# Patient Record
Sex: Female | Born: 1935 | Race: White | Hispanic: No | Marital: Single | Smoking: Never smoker
Health system: Southern US, Community
[De-identification: ages and names within clinical notes are randomized; demographics above are authoritative.]

## PROBLEM LIST (undated history)

## (undated) DIAGNOSIS — I5022 Chronic systolic (congestive) heart failure: Secondary | ICD-10-CM

## (undated) DIAGNOSIS — R4189 Other symptoms and signs involving cognitive functions and awareness: Secondary | ICD-10-CM

## (undated) DIAGNOSIS — E785 Hyperlipidemia, unspecified: Secondary | ICD-10-CM

## (undated) DIAGNOSIS — M1990 Unspecified osteoarthritis, unspecified site: Secondary | ICD-10-CM

## (undated) DIAGNOSIS — F410 Panic disorder [episodic paroxysmal anxiety] without agoraphobia: Secondary | ICD-10-CM

## (undated) DIAGNOSIS — F419 Anxiety disorder, unspecified: Secondary | ICD-10-CM

## (undated) DIAGNOSIS — F329 Major depressive disorder, single episode, unspecified: Secondary | ICD-10-CM

## (undated) DIAGNOSIS — I10 Essential (primary) hypertension: Secondary | ICD-10-CM

## (undated) DIAGNOSIS — G20 Parkinson's disease: Secondary | ICD-10-CM

## (undated) DIAGNOSIS — I4819 Other persistent atrial fibrillation: Secondary | ICD-10-CM

## (undated) DIAGNOSIS — F32A Depression, unspecified: Secondary | ICD-10-CM

## (undated) DIAGNOSIS — R2681 Unsteadiness on feet: Secondary | ICD-10-CM

## (undated) DIAGNOSIS — G20A1 Parkinson's disease without dyskinesia, without mention of fluctuations: Secondary | ICD-10-CM

## (undated) HISTORY — DX: Other persistent atrial fibrillation: I48.19

## (undated) HISTORY — DX: Panic disorder (episodic paroxysmal anxiety): F41.0

## (undated) HISTORY — DX: Unsteadiness on feet: R26.81

## (undated) HISTORY — DX: Parkinson's disease without dyskinesia, without mention of fluctuations: G20.A1

## (undated) HISTORY — DX: Parkinson's disease: G20

## (undated) HISTORY — DX: Chronic systolic (congestive) heart failure: I50.22

## (undated) HISTORY — DX: Unspecified osteoarthritis, unspecified site: M19.90

## (undated) HISTORY — DX: Hyperlipidemia, unspecified: E78.5

## (undated) HISTORY — DX: Other symptoms and signs involving cognitive functions and awareness: R41.89

## (undated) HISTORY — DX: Essential (primary) hypertension: I10

## (undated) HISTORY — DX: Depression, unspecified: F32.A

## (undated) HISTORY — PX: EYE SURGERY: SHX253

## (undated) HISTORY — DX: Anxiety disorder, unspecified: F41.9

## (undated) HISTORY — DX: Major depressive disorder, single episode, unspecified: F32.9

---

## 2005-09-17 ENCOUNTER — Ambulatory Visit: Payer: Self-pay | Admitting: Internal Medicine

## 2007-04-22 ENCOUNTER — Ambulatory Visit: Payer: Self-pay | Admitting: Internal Medicine

## 2007-12-22 ENCOUNTER — Other Ambulatory Visit: Payer: Self-pay

## 2007-12-22 ENCOUNTER — Ambulatory Visit: Payer: Self-pay | Admitting: Ophthalmology

## 2007-12-29 ENCOUNTER — Ambulatory Visit: Payer: Self-pay | Admitting: Ophthalmology

## 2008-04-27 ENCOUNTER — Ambulatory Visit: Payer: Self-pay | Admitting: Internal Medicine

## 2009-05-03 ENCOUNTER — Ambulatory Visit: Payer: Self-pay | Admitting: Internal Medicine

## 2010-05-08 ENCOUNTER — Ambulatory Visit: Payer: Self-pay | Admitting: Internal Medicine

## 2011-06-04 ENCOUNTER — Ambulatory Visit: Payer: Self-pay | Admitting: Internal Medicine

## 2012-02-07 ENCOUNTER — Observation Stay: Payer: Self-pay | Admitting: Internal Medicine

## 2012-02-07 LAB — CBC
HCT: 34.7 % — ABNORMAL LOW (ref 35.0–47.0)
HGB: 12.1 g/dL (ref 12.0–16.0)
MCH: 33.2 pg (ref 26.0–34.0)
MCHC: 34.9 g/dL (ref 32.0–36.0)
MCV: 95 fL (ref 80–100)
Platelet: 280 10*3/uL (ref 150–440)
RBC: 3.65 10*6/uL — ABNORMAL LOW (ref 3.80–5.20)
WBC: 5.9 10*3/uL (ref 3.6–11.0)

## 2012-02-07 LAB — COMPREHENSIVE METABOLIC PANEL
Albumin: 3.4 g/dL (ref 3.4–5.0)
Alkaline Phosphatase: 88 U/L (ref 50–136)
Anion Gap: 8 (ref 7–16)
Calcium, Total: 9 mg/dL (ref 8.5–10.1)
Creatinine: 0.95 mg/dL (ref 0.60–1.30)
Glucose: 103 mg/dL — ABNORMAL HIGH (ref 65–99)
Osmolality: 287 (ref 275–301)
Potassium: 3.2 mmol/L — ABNORMAL LOW (ref 3.5–5.1)
SGOT(AST): 21 U/L (ref 15–37)

## 2012-02-07 LAB — URINALYSIS, COMPLETE
Bilirubin,UR: NEGATIVE
Blood: NEGATIVE
Glucose,UR: NEGATIVE mg/dL (ref 0–75)
Ketone: NEGATIVE
Nitrite: NEGATIVE
Protein: NEGATIVE
RBC,UR: 1 /HPF (ref 0–5)
Squamous Epithelial: 2
WBC UR: <1 /HPF (ref 0–5)

## 2012-02-08 LAB — BASIC METABOLIC PANEL
BUN: 20 mg/dL — ABNORMAL HIGH (ref 7–18)
Calcium, Total: 8.8 mg/dL (ref 8.5–10.1)
Chloride: 105 mmol/L (ref 98–107)
Creatinine: 0.97 mg/dL (ref 0.60–1.30)
EGFR (African American): >60
EGFR (Non-African Amer.): 55 — ABNORMAL LOW
Osmolality: 286 (ref 275–301)

## 2012-03-15 ENCOUNTER — Emergency Department: Payer: Self-pay | Admitting: Emergency Medicine

## 2012-06-17 ENCOUNTER — Ambulatory Visit: Payer: Self-pay | Admitting: Internal Medicine

## 2012-06-19 ENCOUNTER — Ambulatory Visit: Payer: Self-pay | Admitting: Internal Medicine

## 2014-07-01 ENCOUNTER — Ambulatory Visit: Payer: Self-pay | Admitting: Internal Medicine

## 2014-07-14 LAB — URINALYSIS, COMPLETE
BLOOD: NEGATIVE
Bilirubin,UR: NEGATIVE
GLUCOSE, UR: NEGATIVE mg/dL (ref 0–75)
Ketone: NEGATIVE
LEUKOCYTE ESTERASE: NEGATIVE
NITRITE: NEGATIVE
PH: 6 (ref 4.5–8.0)
Protein: NEGATIVE
RBC,UR: <1 /HPF (ref 0–5)
Specific Gravity: 1.005 (ref 1.003–1.030)
Squamous Epithelial: NONE SEEN
WBC UR: 1 /HPF (ref 0–5)

## 2014-07-14 LAB — COMPREHENSIVE METABOLIC PANEL
ALBUMIN: 3.1 g/dL — AB (ref 3.4–5.0)
ALK PHOS: 80 U/L
ANION GAP: 9 (ref 7–16)
AST: 26 U/L (ref 15–37)
BUN: 18 mg/dL (ref 7–18)
Bilirubin,Total: 0.4 mg/dL (ref 0.2–1.0)
Calcium, Total: 8.6 mg/dL (ref 8.5–10.1)
Chloride: 102 mmol/L (ref 98–107)
Co2: 29 mmol/L (ref 21–32)
Creatinine: 1.2 mg/dL (ref 0.60–1.30)
EGFR (African American): 55 — ABNORMAL LOW
EGFR (Non-African Amer.): 46 — ABNORMAL LOW
GLUCOSE: 99 mg/dL (ref 65–99)
Osmolality: 281 (ref 275–301)
POTASSIUM: 3.3 mmol/L — AB (ref 3.5–5.1)
SGPT (ALT): 18 U/L
Sodium: 140 mmol/L (ref 136–145)
TOTAL PROTEIN: 6.8 g/dL (ref 6.4–8.2)

## 2014-07-14 LAB — DIFFERENTIAL
BASOS ABS: 0.1 10*3/uL (ref 0.0–0.1)
Basophil %: 0.7 %
Eosinophil #: 0.2 10*3/uL (ref 0.0–0.7)
Eosinophil %: 2.5 %
LYMPHS PCT: 10.9 %
Lymphocyte #: 1 10*3/uL (ref 1.0–3.6)
Monocyte #: 1.1 x10 3/mm — ABNORMAL HIGH (ref 0.2–0.9)
Monocyte %: 12.1 %
Neutrophil #: 6.5 10*3/uL (ref 1.4–6.5)
Neutrophil %: 73.8 %

## 2014-07-14 LAB — CBC
HCT: 35.2 % (ref 35.0–47.0)
HGB: 11.6 g/dL — AB (ref 12.0–16.0)
MCH: 33 pg (ref 26.0–34.0)
MCHC: 32.9 g/dL (ref 32.0–36.0)
MCV: 100 fL (ref 80–100)
Platelet: 236 10*3/uL (ref 150–440)
RBC: 3.51 10*6/uL — AB (ref 3.80–5.20)
RDW: 13.4 % (ref 11.5–14.5)
WBC: 8.8 10*3/uL (ref 3.6–11.0)

## 2014-07-14 LAB — TSH: Thyroid Stimulating Horm: 6.69 u[IU]/mL — ABNORMAL HIGH

## 2014-07-14 LAB — TROPONIN I: Troponin-I: <0.02 ng/mL

## 2014-07-15 ENCOUNTER — Observation Stay: Payer: Self-pay | Admitting: Internal Medicine

## 2014-07-15 ENCOUNTER — Other Ambulatory Visit: Payer: Self-pay | Admitting: Physician Assistant

## 2014-07-15 DIAGNOSIS — I4891 Unspecified atrial fibrillation: Secondary | ICD-10-CM

## 2014-07-15 LAB — CBC WITH DIFFERENTIAL/PLATELET
Basophil #: 0.2 10*3/uL — ABNORMAL HIGH (ref 0.0–0.1)
Basophil %: 2.8 %
Eosinophil #: 0.2 10*3/uL (ref 0.0–0.7)
Eosinophil %: 3.6 %
HCT: 31.2 % — ABNORMAL LOW (ref 35.0–47.0)
HGB: 10.4 g/dL — AB (ref 12.0–16.0)
LYMPHS PCT: 11.6 %
Lymphocyte #: 0.8 10*3/uL — ABNORMAL LOW (ref 1.0–3.6)
MCH: 33.1 pg (ref 26.0–34.0)
MCHC: 33.4 g/dL (ref 32.0–36.0)
MCV: 99 fL (ref 80–100)
Monocyte #: 0.8 x10 3/mm (ref 0.2–0.9)
Monocyte %: 10.9 %
NEUTROS PCT: 71.1 %
Neutrophil #: 4.9 10*3/uL (ref 1.4–6.5)
PLATELETS: 214 10*3/uL (ref 150–440)
RBC: 3.15 10*6/uL — ABNORMAL LOW (ref 3.80–5.20)
RDW: 13.3 % (ref 11.5–14.5)
WBC: 6.9 10*3/uL (ref 3.6–11.0)

## 2014-07-15 LAB — HEPARIN LEVEL (UNFRACTIONATED): Anti-Xa(Unfractionated): 0.86 IU/mL — ABNORMAL HIGH (ref 0.30–0.70)

## 2014-07-15 LAB — PROTIME-INR
INR: 1.1
PROTHROMBIN TIME: 13.6 s (ref 11.5–14.7)

## 2014-07-15 LAB — MAGNESIUM: MAGNESIUM: 1.8 mg/dL

## 2014-07-15 LAB — T4, FREE: Free Thyroxine: 1.18 ng/dL (ref 0.76–1.46)

## 2014-07-15 LAB — APTT: Activated PTT: 86.6 secs — ABNORMAL HIGH (ref 23.6–35.9)

## 2014-07-16 LAB — CBC WITH DIFFERENTIAL/PLATELET
Basophil #: 0.1 10*3/uL (ref 0.0–0.1)
Basophil %: 0.7 %
Eosinophil #: 0.2 10*3/uL (ref 0.0–0.7)
Eosinophil %: 2.4 %
HCT: 30.6 % — ABNORMAL LOW (ref 35.0–47.0)
HGB: 10.1 g/dL — ABNORMAL LOW (ref 12.0–16.0)
Lymphocyte #: 1.2 10*3/uL (ref 1.0–3.6)
Lymphocyte %: 14.4 %
MCH: 33.1 pg (ref 26.0–34.0)
MCHC: 32.9 g/dL (ref 32.0–36.0)
MCV: 101 fL — ABNORMAL HIGH (ref 80–100)
Monocyte #: 1.3 x10 3/mm — ABNORMAL HIGH (ref 0.2–0.9)
Monocyte %: 14.8 %
Neutrophil #: 5.9 10*3/uL (ref 1.4–6.5)
Neutrophil %: 67.7 %
Platelet: 200 10*3/uL (ref 150–440)
RBC: 3.04 10*6/uL — ABNORMAL LOW (ref 3.80–5.20)
RDW: 13.7 % (ref 11.5–14.5)
WBC: 8.7 10*3/uL (ref 3.6–11.0)

## 2014-07-16 LAB — POTASSIUM: Potassium: 4 mmol/L (ref 3.5–5.1)

## 2014-07-17 LAB — BASIC METABOLIC PANEL
Anion Gap: 10 (ref 7–16)
BUN: 18 mg/dL (ref 7–18)
CALCIUM: 8.4 mg/dL — AB (ref 8.5–10.1)
CHLORIDE: 108 mmol/L — AB (ref 98–107)
Co2: 27 mmol/L (ref 21–32)
Creatinine: 0.99 mg/dL (ref 0.60–1.30)
EGFR (African American): >60
EGFR (Non-African Amer.): 57 — ABNORMAL LOW
Glucose: 135 mg/dL — ABNORMAL HIGH (ref 65–99)
OSMOLALITY: 293 (ref 275–301)
POTASSIUM: 4 mmol/L (ref 3.5–5.1)
Sodium: 145 mmol/L (ref 136–145)

## 2014-07-17 LAB — MAGNESIUM: MAGNESIUM: 1.9 mg/dL

## 2014-07-18 LAB — BASIC METABOLIC PANEL
ANION GAP: 7 (ref 7–16)
BUN: 25 mg/dL — ABNORMAL HIGH (ref 7–18)
CALCIUM: 8.8 mg/dL (ref 8.5–10.1)
CO2: 30 mmol/L (ref 21–32)
Chloride: 107 mmol/L (ref 98–107)
Creatinine: 1.05 mg/dL (ref 0.60–1.30)
GFR CALC NON AF AMER: 53 — AB
Glucose: 116 mg/dL — ABNORMAL HIGH (ref 65–99)
OSMOLALITY: 292 (ref 275–301)
Potassium: 3.7 mmol/L (ref 3.5–5.1)
Sodium: 144 mmol/L (ref 136–145)

## 2014-07-19 ENCOUNTER — Telehealth: Payer: Self-pay

## 2014-07-19 NOTE — Telephone Encounter (Signed)
Attempted to contact pt regarding discharge from [facility] 07/18/14. Left detailed message asking pt to call back w/ any questions or concerns about her meds or discharge instructions. Advised her of appt w/ Eula Listenyan Dunn, PA on 07/26/13 @ 1:15.

## 2014-07-20 ENCOUNTER — Encounter: Payer: Self-pay | Admitting: Physician Assistant

## 2014-07-26 ENCOUNTER — Encounter: Payer: Self-pay | Admitting: Physician Assistant

## 2014-07-26 ENCOUNTER — Ambulatory Visit (INDEPENDENT_AMBULATORY_CARE_PROVIDER_SITE_OTHER): Payer: Medicare Other | Admitting: Physician Assistant

## 2014-07-26 ENCOUNTER — Encounter (INDEPENDENT_AMBULATORY_CARE_PROVIDER_SITE_OTHER): Payer: Self-pay

## 2014-07-26 VITALS — BP 128/62 | HR 86 | Ht 64.0 in | Wt 154.0 lb

## 2014-07-26 DIAGNOSIS — I10 Essential (primary) hypertension: Secondary | ICD-10-CM

## 2014-07-26 DIAGNOSIS — I5022 Chronic systolic (congestive) heart failure: Secondary | ICD-10-CM | POA: Insufficient documentation

## 2014-07-26 DIAGNOSIS — F410 Panic disorder [episodic paroxysmal anxiety] without agoraphobia: Secondary | ICD-10-CM

## 2014-07-26 DIAGNOSIS — R4189 Other symptoms and signs involving cognitive functions and awareness: Secondary | ICD-10-CM

## 2014-07-26 DIAGNOSIS — R531 Weakness: Secondary | ICD-10-CM

## 2014-07-26 DIAGNOSIS — R2681 Unsteadiness on feet: Secondary | ICD-10-CM

## 2014-07-26 DIAGNOSIS — F329 Major depressive disorder, single episode, unspecified: Secondary | ICD-10-CM

## 2014-07-26 DIAGNOSIS — I4819 Other persistent atrial fibrillation: Secondary | ICD-10-CM

## 2014-07-26 DIAGNOSIS — I4891 Unspecified atrial fibrillation: Secondary | ICD-10-CM

## 2014-07-26 DIAGNOSIS — F32A Depression, unspecified: Secondary | ICD-10-CM | POA: Insufficient documentation

## 2014-07-26 DIAGNOSIS — I481 Persistent atrial fibrillation: Secondary | ICD-10-CM

## 2014-07-26 NOTE — Patient Instructions (Signed)
We will draw labs today:  CBC, BMET  Your physician recommends that you continue on your current medications as directed. Please refer to the Current Medication list given to you today.  Your physician recommends that you schedule a follow-up appointment in: 1 month

## 2014-07-26 NOTE — Progress Notes (Signed)
Patient Name: Anita BreachMartha Santerre, DOB 1928-12-09, MRN [id number]  Date of Encounter: 07/26/2014  Primary Care Provider:  Rozanna BoxBABAOFF, MARC E, MD Primary Cardiologist:  Dr. Kirke CorinArida, MD  HPI:  81 year old female with history of HTN, gait instability, cognitive impairment, depression, and panic attacks who was [facility]18 with a history of increasing weakness and found to be in new onset a-fib with RVR, rates into the 160s at times.   She denies any prior known cardiac history. She currently lives at [facility]. She has known cognitive impairment and has been followed by numerous doctors in the outpatient setting for this. [facility] at [facility]. Now, most recently she sees Lorrin MaisAndy Matrone, NP. Over the past several months her fatigue has been increasing. Her gait has become weaker to the point that the option of a wheelchair has been brought up by several care providers, though she has not yet been placed in one. No history of falls. At times she has been too weak to get herself out of the shower, requiring the assistance of 2-3 nursing aids to get her out.   Over the past 2-3 weeks she has noted a sensation of "hot" and pat her chest. Per her sister her care providers felt like this was her anxiety. No work up was pursued at that time. She denies any chest pain. Ultimately, her weakness continued to progress to the point that she was unable to stand from a seated postion, prompting her to be brought to [facility] for further evaluation. She never did fall. She denied any chest pain or SOB. Weight has remained stable.   [facility] she was found to be in new onset a-fib with RVR, rates into the 160s at times, mostly in the 1-teens to low 100s. She was already on propranolol for her anxiety. This was changed to metoprolol 25 mg bid for better rate control. Her TSH was found to be in the 6 range with normal free T4. K+ found to be 3.3. Magnesium 1.8-->1.9. Troponin  negative x 1. Echo showed EF 45-50%, dilated LA of 4.5 cm, dilated RA, moderate to severe MR, moderate TR, mildly elevated PASP at 45.2 mm Hg. Head CT showed chronic infarcts along the She currently lives at [facility]. She has known cognitive impairment and has been followed by numerous doctors in the outpatient setting for this. [facility] at [facility]. Now, most recently she sees Lorrin MaisAndy Matrone, NP. Over the past several months her fatigue has been increasing. Her gait has become weaker to the point that the option of a wheelchair has been brought up by several care providers, though she has not yet been placed in one. No history of falls. At times she has been too weak to get herself out of the shower, requiring the assistance of 2-3 nursing aids to get her out.   Over the past 2-3 weeks she has noted a sensation of "hot" and pat her chest. Per her sister her care providers felt like this was her anxiety. No work up was pursued at that time. She denies any chest pain. Ultimately, her weakness continued to progress to the point that she was unable to stand from a seated postion, prompting her to be brought [facility] for [facility] evaluation. She never did fall. She denied any chest pain or SOB. Weight has remained stable.   Upon [facility] she was found to be in new onset  a-fib with RVR, rates into the 160s at times, mostly in the 1-teens to low 100s. She was already on propranolol for her anxiety. This was changed to metoprolol 25 mg bid for better rate control along with the addition of diltiazem 30 mg tid (which was continued at discharge 2/2 the need for her pills to be crushed). Her TSH was found to be in the 6 range with normal free T4. K+ found to be 3.3. Magnesium 1.8-->1.9. Troponin negative x 1. Echo showed EF 45-50%, dilated LA at 4.5 cm, dilated RA, moderate to severe MR, moderate TR, and mildly elevated PASP at 45.2 mm Hg. CXR showed mild vascular congestion - she  received one dose of IV Lasix with good diuresis. CT of the head showed chronic infarct of the right frontal and temporal lobes, no acute pathology. She was started on low dose Eliquis 2.5 mg bid given her likely to be wheelchair bound (low risk of falls) and CHADSVASc score >= 6 giving her an annual estimated risk of stroke of 9%.  In hospital follow up today both the patient and the patient's daughter report she is doing well. No further palpitations. She is now wheelchair bound and is moving around [facility] assisted living facility with ease. No SOB. Patient's daughter is uncertain about her weight. She has not had any chest pain. She is taking all of her medications daily without issues. She reports bilateral lower extremity edema, though this is not new and has been present for months. No cough. She is on a low sodium diet, though she really enjoys some foods that are high in sodium. No BRBPR or melena. No falls.    Problem List:   Past Medical History  Diagnosis Date  . Persistent atrial fibrillation     a. Eliquis 2.5 mg bid  . Chronic systolic CHF (congestive heart failure)     a. echo 07/2014: EF 45-50%, dilated LA at 4.5 cm, mildly dilated RA, mod-sev MR, mod TR, mildly elevated PASP @ 45.2  . HTN (hypertension)   . Cognitive impairment   . Gait instability   . Depression   . Panic attacks    History reviewed. No pertinent past surgical history. Family History  Problem Relation Age of Onset  . Family history unknown: Yes     Allergies:  Allergies  Allergen Reactions  . Aliskiren-Amlodipine-Hctz Other (See Comments)  . Furosemide Other (See Comments)    Questionable.  . Sulfa Antibiotics Swelling     Home Medications:  Current Outpatient Prescriptions  Medication Sig Dispense Refill  . Amantadine HCl 100 MG tablet Take 100 mg by mouth 2 (two) times daily.     Marland Kitchen aspirin EC 81 MG tablet Take 81 mg by mouth daily.     . benzonatate (TESSALON) 200 MG capsule Take 200 mg  by mouth 3 (three) times daily as needed for cough.    Marland Kitchen ELIQUIS 2.5 MG TABS tablet Take 2.5 mg by mouth.     . fexofenadine (ALLEGRA) 180 MG tablet Take 180 mg by mouth daily.    . furosemide (LASIX) 20 MG tablet 20 mg 2 (two) times daily.     Marland Kitchen levothyroxine (SYNTHROID, LEVOTHROID) 50 MCG tablet Take 50 mcg by mouth daily before breakfast.     . metoprolol (LOPRESSOR) 50 MG tablet Take 50 mg by mouth 2 (two) times daily.     . montelukast (SINGULAIR) 10 MG tablet Take 10 mg by mouth at bedtime.     Marland Kitchen  mupirocin ointment (BACTROBAN) 2 % Apply 1 application topically 2 (two) times daily.     Marland Kitchen PARoxetine (PAXIL) 10 MG tablet Take 10 mg by mouth.     . potassium chloride SA (K-DUR,KLOR-CON) 20 MEQ tablet Take 20 mEq by mouth daily.      No current facility-administered medications for this visit.    Weights: Wt Readings from Last 3 Encounters:  07/26/14 154 lb (69.854 kg)     Review of Systems:  As above. All other systems reviewed and are otherwise negative except as noted above.  Physical Exam:  Blood pressure 128/62, pulse 86, height  (1.626 m), weight 154 lb (69.854 kg).  General: Pleasant, NAD, wheelchair bound  Psych: Normal affect. Neuro: Alert and oriented X 3. Moves all extremities spontaneously. HEENT: Normal  Neck: Supple without bruits or JVD. Lungs:  Resp regular and unlabored, CTA. Heart: RRR no s3, s4, or murmurs. Abdomen: Soft, non-tender, non-distended, BS + x 4.  Extremities: No clubbing, cyanosis or edema.    Accessory Clinical Findings:  EKG - a-fib, 86, bpm, nonspecific lateral st depression (not new)  Assessment & Plan:  1. Persistent a-fib:  -Rate controlled  -CHADSVASc >=6 giving her an annual estimated risk of stoke of 9%  -Now that she is wheelchair bound her risk of fall is significantly lower than previously  -Continue Eliquis 2.5 mg bid  -Continue Lopressor 50 mg bid  -Patient and her daughter decline cardioversion at this time 2/2 her  doing quite well in rate controlled a-fib, she will follow up in one month to make sure she is still doing well. If that is the case at that time, will likely hold off on cardioversion -Check CBC  2. Chronic systolic CHF:  -Possibly 2/2 tachyarrhythmia in the setting of her a-fib with RVR (EF 45-50% on 07/2014) -Goal HR less than 100  -Now that she is wheelchair bound she has noted a small amount of lower extremity edema bilaterally (this is not new per patient's daughter and is a longstanding issue) -Advised patient to elevate her legs and wear compression hose  -Continue Lopressor as above, Lasix 20 mg daily, and KCl 20 meq daily  -Check bmet  -Lifestyle changes were discussed in detail regarding salt restriction   3. HTN:  -Well controlled -Continue current medications as above   4. Cognitive impairment/gait instability: -Followed by PCP   Eula Listen, PA-C [facility] [address] [place], [place] [address] [phone] [facility] 07/26/2014, 3:03 PM

## 2014-07-27 LAB — CBC WITH DIFFERENTIAL/PLATELET
BASOS ABS: 0 10*3/uL (ref 0.0–0.2)
BASOS: 0 %
EOS: 3 %
Eosinophils Absolute: 0.2 10*3/uL (ref 0.0–0.4)
HEMATOCRIT: 37.3 % (ref 34.0–46.6)
HEMOGLOBIN: 12.1 g/dL (ref 11.1–15.9)
IMMATURE GRANS (ABS): 0 10*3/uL (ref 0.0–0.1)
IMMATURE GRANULOCYTES: 0 %
Lymphocytes Absolute: 1.1 10*3/uL (ref 0.7–3.1)
Lymphs: 17 %
MCH: 32.4 pg (ref 26.6–33.0)
MCHC: 32.4 g/dL (ref 31.5–35.7)
MCV: 100 fL — ABNORMAL HIGH (ref 79–97)
MONOCYTES: 10 %
MONOS ABS: 0.6 10*3/uL (ref 0.1–0.9)
Neutrophils Absolute: 4.5 10*3/uL (ref 1.4–7.0)
Neutrophils Relative %: 70 %
Platelets: 331 10*3/uL (ref 150–379)
RBC: 3.74 x10E6/uL — ABNORMAL LOW (ref 3.77–5.28)
RDW: 13.8 % (ref 12.3–15.4)
WBC: 6.6 10*3/uL (ref 3.4–10.8)

## 2014-07-27 LAB — BASIC METABOLIC PANEL
BUN / CREAT RATIO: 20 (ref 11–26)
BUN: 20 mg/dL (ref 8–27)
CO2: 24 mmol/L (ref 18–29)
Calcium: 8.8 mg/dL (ref 8.7–10.3)
Chloride: 101 mmol/L (ref 97–108)
Creatinine, Ser: 0.99 mg/dL (ref 0.57–1.00)
GFR calc Af Amer: 61 mL/min/{1.73_m2} (ref >59)
GFR, EST NON AFRICAN AMERICAN: 52 mL/min/{1.73_m2} — AB (ref >59)
Glucose: 87 mg/dL (ref 65–99)
Potassium: 4.2 mmol/L (ref 3.5–5.2)
SODIUM: 140 mmol/L (ref 134–144)

## 2014-08-01 ENCOUNTER — Ambulatory Visit: Payer: Self-pay | Admitting: Internal Medicine

## 2014-08-23 ENCOUNTER — Encounter: Payer: Self-pay | Admitting: *Deleted

## 2014-08-23 ENCOUNTER — Encounter: Payer: Self-pay | Admitting: Physician Assistant

## 2014-08-23 ENCOUNTER — Ambulatory Visit (INDEPENDENT_AMBULATORY_CARE_PROVIDER_SITE_OTHER): Payer: Medicare Other | Admitting: Physician Assistant

## 2014-08-23 VITALS — BP 106/60 | HR 62 | Ht 65.0 in

## 2014-08-23 DIAGNOSIS — I10 Essential (primary) hypertension: Secondary | ICD-10-CM

## 2014-08-23 DIAGNOSIS — I482 Chronic atrial fibrillation, unspecified: Secondary | ICD-10-CM | POA: Insufficient documentation

## 2014-08-23 DIAGNOSIS — I4891 Unspecified atrial fibrillation: Secondary | ICD-10-CM

## 2014-08-23 DIAGNOSIS — R4189 Other symptoms and signs involving cognitive functions and awareness: Secondary | ICD-10-CM

## 2014-08-23 DIAGNOSIS — R2681 Unsteadiness on feet: Secondary | ICD-10-CM

## 2014-08-23 DIAGNOSIS — I5022 Chronic systolic (congestive) heart failure: Secondary | ICD-10-CM

## 2014-08-23 NOTE — Progress Notes (Signed)
Patient Name: [name], [name] April 20, 1929, MRN [id number]  Date of Encounter: 08/23/2014  Primary Care Provider:  Rozanna Box, MD Primary Cardiologist:  Dr. Kirke Corin, MD  Chief Complaint  Patient presents with  . Other    C/o edema ankles/feet. Meds reviewed verbally with pt.    HPI:  81 year old female with history of HTN, gait instability, cognitive impairment, depression, and panic attacks who returns for routine follow up of a-fib with RVR and chronic systolic CHF.    She was recently admitted to [facility] 1/15-1/18/2016 for new onset a-fib with RVR and acute systolic CHF. She currently lives at [facility]. She has known cognitive impairment and has been followed by numerous doctors in the outpatient setting for this. Previously at [facility]. Now, most recently she sees Lorrin Mais, NP. Over the past several months her fatigue has been increasing. Her gait has become weaker to the point that the option of a wheelchair has been brought up by several care providers, though she had not yet been placed in one leading up to the above admission. No history of falls. At times, she has been too weak to get herself out of the shower, requiring the assistance of 2-3 nursing aids to get her out.   Prior to her above admission, over the past 2-3 weeks she noted a sensation of "hot" and would pat her chest. Per her sister her care providers felt like this was her anxiety. No work up was pursued at that time. She denied any chest pain. Ultimately, her weakness continued to progress to the point that she was unable to stand from a seated postion, prompting her to be brought to [facility] for further evaluation. Weight has remained stable.   Upon her arrival to [facility] she was found to be in new onset a-fib with RVR, rates into the 160s at times, mostly in the 1-teens to low 100s. She was already on propranolol for her anxiety. This was changed to metoprolol 25 mg bid for better rate control.  Her TSH was found to be in the 6 range with normal free T4. K+ found to be 3.3. Magnesium 1.8-->1.9. Troponin negative x 1. Echo showed EF 45-50%, dilated LA of 4.5 cm, dilated RA, moderate to severe MR, moderate TR, mildly elevated PASP at 45.2 mm Hg. Head CT showed chronic infarcts along the right frontal and temporal lobes, with  associated encephalomalacia. CXR showed mild vascular congestion - she received one dose of IV Lasix with good diuresis. She was started on low dose Eliquis 2.5 mg bid given her likely to be wheelchair bound (low risk of falls) and CHADSVASc score >= 6 giving her an annual estimated risk of stroke of 9%.   In hospital follow up today both the patient and the patient's daughter report she is doing well. No further palpitations. She was wheelchair bound at that time and is moving around well. No SOB. Patient's daughter was uncertain about her weight. She denied any chest pain. She is taking all of her medications daily without issues. She reported bilateral lower extremity edema, though this is was not new and had been present for months. No cough. She was on a low sodium diet, though she really enjoys some foods that are high in sodium. No BRBPR or melena. No falls. Patient and her daughter declined any invasive procedure at that time, including DCCV. She was continued on her current medications.   In follow up today she reports  she is doing well. No palpitations. She enjoys sitting in her wheelchair throughout the day. She does not want to get out of her wheelchair to get into her recliner to elevate her feet. She is much more mobile now. Weight has been stable. No SOB or chest pain. Her daughter continues to note her chronic lower extremity swelling. No BRBPR or melena. No falls.       Past Medical History  Diagnosis Date  . Persistent atrial fibrillation     a. Eliquis 2.5 mg bid  . Chronic systolic CHF (congestive heart failure)     a. echo 07/2014: EF 45-50%, dilated  LA at 4.5 cm, mildly dilated RA, mod-sev MR, mod TR, mildly elevated PASP @ 45.2  . HTN (hypertension)   . Cognitive impairment   . Gait instability   . Depression   . Panic attacks   . Arrhythmia   . Arthritis   . Hyperlipidemia   . Parkinson's disease   . Anxiety disorder   :  History reviewed. No pertinent past surgical history.:  Social History:  The patient  reports that she has never smoked. She does not have any smokeless tobacco history on file. She reports that she does not drink alcohol or use illicit drugs.   Family History  Problem Relation Age of Onset  . Family history unknown: Yes     Allergies:  Allergies  Allergen Reactions  . Aliskiren-Amlodipine-Hctz Other (See Comments)  . Furosemide Other (See Comments)    Questionable.  . Sulfa Antibiotics Swelling     Home Medications:  Current Outpatient Prescriptions  Medication Sig Dispense Refill  . Amantadine HCl 100 MG tablet Take 100 mg by mouth 2 (two) times daily.     . benzonatate (TESSALON) 200 MG capsule Take 200 mg by mouth every 6 (six) hours.    . carbamide peroxide (DEBROX) 6.5 % otic solution 5 drops 2 (two) times daily.    Marland Kitchen docusate sodium (COLACE) 100 MG capsule Take 100 mg by mouth 2 (two) times daily.    Marland Kitchen ELIQUIS 2.5 MG TABS tablet Take 2.5 mg by mouth.     . fexofenadine (ALLEGRA) 180 MG tablet Take 180 mg by mouth daily.    . furosemide (LASIX) 20 MG tablet Take 20 mg by mouth daily.     Marland Kitchen ipratropium-albuterol (DUONEB) 0.5-2.5 (3) MG/3ML SOLN Take 3 mLs by nebulization every 4 (four) hours as needed.    Marland Kitchen levothyroxine (SYNTHROID, LEVOTHROID) 50 MCG tablet Take 50 mcg by mouth daily before breakfast.     . metoprolol (LOPRESSOR) 50 MG tablet Take 50 mg by mouth 2 (two) times daily.     . montelukast (SINGULAIR) 10 MG tablet Take 10 mg by mouth at bedtime.     . Multiple Vitamin (MULTIVITAMIN) tablet Take 1 tablet by mouth daily.    . mupirocin ointment (BACTROBAN) 2 % Apply 1  application topically 2 (two) times daily.     Marland Kitchen PARoxetine (PAXIL) 20 MG tablet Take 20 mg by mouth daily.    . potassium chloride SA (K-DUR,KLOR-CON) 20 MEQ tablet Take 20 mEq by mouth daily.      No current facility-administered medications for this visit.     Weights: Wt Readings from Last 3 Encounters:  07/26/14 154 lb (69.854 kg)     Review of Systems:  As above.  All other systems reviewed and are otherwise negative except as noted above.  Physical Exam:  Blood pressure 106/60, pulse 62, height 5'  5" (1.651 m), weight 0 lb (0 kg).  General: Pleasant, NAD Psych: Normal affect. Neuro: Alert and oriented X 3. Moves all extremities spontaneously. HEENT: Normal  Neck: Supple without bruits or JVD. Lungs:  Resp regular and unlabored, CTA. Heart: RRR no s3, s4, or murmurs. Abdomen: Soft, non-tender, non-distended, BS + x 4.  Extremities: No clubbing, cyanosis or edema.    Accessory Clinical Findings:  EKG - a-fib, 62 bpm, nonspecific lateral st depression (not new)   Recent Labs: 07/26/2014: BUN 20; Creatinine 0.99; Hemoglobin 12.1; Platelets 331; Potassium 4.2; Sodium 140     Assessment & Plan:  1. Chronic a-fib:  -Rate controlled -CHADSVASc >=6 giving her an annual estimated risk of stoke of 9%  -Now that she is wheelchair bound her risk of fall is significantly lower than previously  -Continue Eliquis 2.5 mg bid  -Continue Lopressor 50 mg bid  -Continue medical therapy, will hold on DCCV at this time given that she is doing well   2. Chronic systolic CHF:  -Possibly 2/2 tachyarrhythmia in the setting of her a-fib with RVR (EF 45-50% on 07/2014) -Goal HR less than 100  -Lasix was increased to 20 mg bid by nursing home 2/2 lower extremity swelling, check bmet to evaluate renal function and potassium -Continue KCl 20 mEq daily at this time pending bmet -Continue Lopressor as above -Lifestyle changes continue to be important, as she does enjoy salt foods   3.  Vascular insufficiency: -This is a long standing issue per the patient and patient's daughter -Now that she is wheelchair bound she does not like to get up and sit in her recliner -Advised patient and put on her nursing home PPW patient is to sit with legs elevated more often -Compression stockings -Lasix as above, pending bmet  3. HTN:  -Well controlled -Continue current medications as above  4. Cognitive impairment/gait instability:  -Followed by PCP -Wheelchair bound  Dispo: -Check bmet -Follow up 2 months   Eula Listenyan Cartier Washko, PA-C [facility] [address] [place], [place] [address] [phone] [facility] 08/23/2014, 1:59 PM

## 2014-08-23 NOTE — Patient Instructions (Signed)
We will draw labs today:  BMET  Your physician recommends that you schedule a follow-up appointment in: 2 months

## 2014-08-24 LAB — BASIC METABOLIC PANEL
BUN/Creatinine Ratio: 16 (ref 11–26)
BUN: 15 mg/dL (ref 8–27)
CHLORIDE: 96 mmol/L — AB (ref 97–108)
CO2: 29 mmol/L (ref 18–29)
Calcium: 8.9 mg/dL (ref 8.7–10.3)
Creatinine, Ser: 0.96 mg/dL (ref 0.57–1.00)
GFR calc Af Amer: 63 mL/min/{1.73_m2} (ref >59)
GFR, EST NON AFRICAN AMERICAN: 54 mL/min/{1.73_m2} — AB (ref >59)
GLUCOSE: 106 mg/dL — AB (ref 65–99)
POTASSIUM: 3.6 mmol/L (ref 3.5–5.2)
Sodium: 140 mmol/L (ref 134–144)

## 2014-09-17 ENCOUNTER — Emergency Department: Payer: Self-pay | Admitting: Emergency Medicine

## 2014-10-18 NOTE — H&P (Signed)
[name], [name] MR#:  [id number] DATE OF BIRTH:  [birth year]  DATE OF ADMISSION:  02/07/2012   PRIMARY CARE PHYSICIAN: Alonna Buckler, MD   REFERRING PHYSICIAN: Dr. Carollee Massed   CHIEF COMPLAINT: Fall today.   HISTORY OF PRESENT ILLNESS: The patient is an 81 year old Caucasian female with a history of hypertension who presented to the ED with a fall by accident today and injured her right shoulder and the knee. She was noted to have a right shoulder dislocation and right knee abrasion. Right shoulder dislocation was reducted by Dr. Carollee Massed. Now the patient is in a sling. The patient came from assisted living. She uses a walker but is unable to walk due to shoulder dislocation. Dr. Carollee Massed discussed with the case manager. We will place for observation. We will get PT, OT evaluation. The patient may be discharged back or need a higher level facility depending on physical therapy evaluation.   PAST MEDICAL HISTORY: Hypertension.   SOCIAL HISTORY: No smoking, alcohol drinking, or illicit drugs. Assisted living resident.   FAMILY HISTORY: Hypertension.   PAST SURGICAL HISTORY: None.   ALLERGIES: Sulfa drugs.    MEDICATIONS: 1. Paroxetine 10 mg p.o. once in the morning.  2. Multivitamin 1 tablet p.o. daily.  3. Singulair 10 mg p.o. daily.  4. HCTZ 1 tablet p.o. daily. 5. Fexofenadine 180 mg p.o. once daily. 6. Atenolol 50 mg p.o. once daily.  7. Aspirin 81 mg p.o. daily.   REVIEW OF SYSTEMS: CONSTITUTIONAL: The patient denies any fever or chills. No headache or dizziness. No weakness. EYES: No double vision or blurred vision. ENT: No hearing loss, epistaxis, postnasal drip. No slurred speech. No dysphagia. RESPIRATORY: No cough, sputum, shortness of breath, or hemoptysis. CARDIOVASCULAR: No chest pain, palpitation, orthopnea, or nocturnal dyspnea. No leg edema. GI: No abdominal pain, nausea, vomiting, or diarrhea. No melena or bloody stool. GU: No dysuria, hematuria, or incontinence.  ENDOCRINE: No polyuria, polydipsia, heat or cold intolerance. HEMATOLOGY: No easy bleeding or bleeding. SKIN: No rash or jaundice. NEUROLOGY: No syncope, loss of consciousness, or seizure. PSYCH: No depression or anxiety.   PHYSICAL EXAMINATION:   VITAL SIGNS: Temperature 99.3, blood pressure 115/60, pulse 59, oxygen saturation 96% on room air.   GENERAL: The patient is alert, awake, oriented in no acute distress.   HEENT: Pupils round, equal, and reactive to light and accommodation. Moist oral mucosa. Clear oropharynx.   NECK: Supple. No JVD or carotid bruit. No lymphadenopathy. No thyromegaly.   CARDIOVASCULAR: S1, S2 regular rate and rhythm. No murmurs or gallops.   PULMONARY: Bilateral air entry. There is some mild wheezing but no crackles or rales. No use of accessory muscles to breathe.   ABDOMEN: Soft. No distention. No tenderness. No organomegaly. Bowel sounds present.   EXTREMITIES: No edema, clubbing, or cyanosis. No calf tenderness. Right shoulder in sling. Mild tenderness.   SKIN: No rash or jaundice.   NEUROLOGY: Alert and oriented x3. No focal deficit.   LABORATORY DATA: Urinalysis negative. WBC 5.9, hemoglobin 12.1, platelet count 280, glucose 103, BUN 24, creatinine 0.95, sodium 142, potassium 3.2, chloride 102, bicarb 32.   Right shoulder x-ray shows interval reduction of humeral head after the procedure and right shoulder dislocation shoulder x-ray before reduction.   Chest x-ray no acute pulmonary disease but there is interstitial prominence and underlying component of pulmonary fibrosis is diagnostic consideration.  Cervical spine no evidence of acute abnormality.   Knee x-ray osteopenia with moderate to severe degenerative disease changes.  EKG  shows sinus bradycardia at 53 beats per minute.   IMPRESSION:  1. Fall.  2. Right shoulder dislocation after reduction.  3. Dehydration.  4. Hypokalemia.  5. Hypertension, controlled.  6. Wheezing, possible  pulmonary fibrosis.   PLAN OF TREATMENT:  1. The patient will be placed for observation.  2. Will get fall precautions. 3. We will give half normal saline IV fluids and follow-up BMP. Hold HCTZ.  4. For hypokalemia, we will give potassium and follow-up BMP and magnesium level.  5. For hypertension, continue atenolol but hold HCTZ due to dehydration.  6. For wheezing and possible pulmonary fibrosis, we will continue Singulair and start DuoNeb p.r.n.  7. GI and DVT prophylaxis.   Discussed the patient's situation with the case manager, Dr. Carollee MassedKaminski, the patient's sister, and the patient. The patient will need physical therapy and depending on physical therapy evaluation the patient may be discharged back to assisted living or skilled nursing facility.       TIME SPENT: About 50 minutes.   ____________________________ Shaune PollackQing Kmya Placide, MD qc:drc D: 02/07/2012 16:28:53 ET T: 02/07/2012 17:08:50 ET JOB#: [id number]  cc: Shaune PollackQing Shai Rasmussen, MD, <Dictator> Reola MosherAndrew S. Randa LynnLamb, MD Shaune PollackQING Clotee Schlicker MD ELECTRONICALLY SIGNED 02/07/2012 21:00

## 2014-10-18 NOTE — Discharge Summary (Signed)
PATIENT NAMElray Kirk:  Witty, Audine MR#:  [id number] DATE OF BIRTH:  [birth year]  DATE OF ADMISSION:  02/07/2012 DATE OF DISCHARGE:  02/09/2012  PRESENTING COMPLAINT: Fall and right shoulder pain.   DISCHARGE DIAGNOSES:  1. Right shoulder dislocation status post fall, status post reduction.  2. Hypertension.   CONDITION ON DISCHARGE: Fair.   DISCHARGE MEDICATIONS:  1. Fexofenadine 180 mg p.o. daily.  2. Atenolol 50 mg daily.  3. Multivitamin p.o. daily.  4. Paxil 10 mg p.o. daily.  5. Aspirin 81 mg daily.  6. Hydrochlorothiazide/triamterene 25/37.5 mg one tablet p.o. daily.  7. Sarna lotion as needed. 8. Singulair 10 mg p.o. at bedtime.  9. Physical therapy.   DISCHARGE FOLLOWUP: Follow up with Dr. Randa LynnLamb in 1 to 2 weeks. [facility] in 1 to 2 weeks. The patient's sister is to make appointments.   DISCHARGE LABS/RADIOLOGIC STUDIES: CBC within normal limits except potassium of 3.2.   Shoulder x-Anita showed dislocation of the right humerus. Status post reduction x-Anita showed normal reduction of the humeral head.   Chest x-Anita: No acute cardiopulmonary abnormality.   Urinalysis IS negative for urinary tract infection.  CBC IS within normal limits.   BRIEF SUMMARY OF HOSPITAL COURSE: [name] is an 81 year old Caucasian female who is a resident of [facility] who came in with:  1. Mechanical fall resulting in right shoulder dislocation. The patient underwent reduction in the ER and now has a sling. PRN pain medications were given and PT was started. Initially the patient required a lot of assistance, however, the patient was able to get use to her left arm sling. We discussed with the sister. She is okay to take the patient to [facility] with physical therapy personal care services.  2. Dehydration, mild, resolved.  3. Hyperkalemia likely due to HCTZ and triamterene. The patient was given K-Dur. 4. Hypertension. The patient was on atenolol, however,  HCTZ/triamterene was continued at discharge.  Her hospital stay otherwise remained stable. The patient remained a FULL CODE. She will follow up with the above physicians in 1 to 2 weeks.   TIME SPENT: 40 minutes.  ____________________________ Wylie HailSona A. Allena KatzPatel, MD sap:slb D: 02/10/2012 06:59:37 ET T: 02/10/2012 14:35:33 ET JOB#: [id number]  cc: Miliani Deike A. Allena KatzPatel, MD, <Dictator> Reola MosherAndrew S. Randa LynnLamb, MD [facility] Willow OraSONA A Shley Dolby MD ELECTRONICALLY SIGNED 02/10/2012 16:06

## 2014-10-24 ENCOUNTER — Ambulatory Visit (INDEPENDENT_AMBULATORY_CARE_PROVIDER_SITE_OTHER): Payer: Medicare Other | Admitting: Cardiovascular Disease

## 2014-10-24 ENCOUNTER — Encounter: Payer: Self-pay | Admitting: Cardiovascular Disease

## 2014-10-24 VITALS — BP 120/80 | HR 64 | Ht 63.0 in | Wt 160.0 lb

## 2014-10-24 DIAGNOSIS — I4891 Unspecified atrial fibrillation: Secondary | ICD-10-CM

## 2014-10-24 DIAGNOSIS — I482 Chronic atrial fibrillation, unspecified: Secondary | ICD-10-CM

## 2014-10-24 DIAGNOSIS — I5022 Chronic systolic (congestive) heart failure: Secondary | ICD-10-CM

## 2014-10-24 DIAGNOSIS — I10 Essential (primary) hypertension: Secondary | ICD-10-CM

## 2014-10-24 DIAGNOSIS — R4189 Other symptoms and signs involving cognitive functions and awareness: Secondary | ICD-10-CM

## 2014-10-24 DIAGNOSIS — R600 Localized edema: Secondary | ICD-10-CM | POA: Diagnosis not present

## 2014-10-24 NOTE — Patient Instructions (Addendum)
You are doing well. No medication changes were made.  Please change the TED hose to closed toe compression hose  Please call us if you have new issues that need to be addressed before your next appt.  Your physician wants you to follow-up in: 4 months.  You will receive a reminder letter in the mail two months in advance. If you don't receive a letter, please call our office to schedule the follow-up appointment.

## 2014-10-24 NOTE — Assessment & Plan Note (Signed)
She appears relatively euvolemic. Recommended she stay on Lasix 20 mg twice a day with potassium daily

## 2014-10-24 NOTE — Assessment & Plan Note (Signed)
Blood pressure is well controlled on today's visit. No changes made to the medications. 

## 2014-10-24 NOTE — Assessment & Plan Note (Signed)
Edema likely from venous insufficiency. Recommended she wear closed toe leg compression Also encouraged leg elevation. Edema less likely from heart failure

## 2014-10-24 NOTE — Assessment & Plan Note (Signed)
Heart rate well controlled, tolerating anticoagulation No changes to her medications 

## 2014-10-24 NOTE — Progress Notes (Signed)
Patient ID: [name], female    DOB: Dec 17, 1928, 81 y.o.   MRN: [id number]  HPI Comments: [name] is a 81 year old woman with history of HTN, gait instability, cognitive impairment, depression, and panic attacks who returns for routine follow up of a-fib with RVR and chronic systolic CHF.   She [facility] lives at [facility].   She presents with her daughter. Daughter reports that she has been doing well. She is essentially wheelchair bound as her legs are now very weak. She has significant tremor. She is eating well, participating in physical therapy. Daughter does not think there is a chance she will walk again given her weakness. Difficulty getting in and out of a car. She denies significant shortness of breath. She is wearing open toe compression hose but still has significant leg swelling. Difficult to weigh as she is unable to stand on the scale Overall daughter feels she is doing better than she was prior to her hospital admission in January 2016  EKG on today's visit shows atrial fibrillation with ventricular rate 64 bpm, no significant ST or T-wave changes  Other past medical history  admitted to [facility] 1/15-1/18/2016 for new onset a-fib with RVR and acute systolic CHF.  She has known cognitive impairment. Over the past several months her fatigue has been increasing.   Prior to her above admission January 2016, over the past 2-3 weeks she noted a sensation of "hot" and would pat her chest. Per her sister her care providers felt like this was her anxiety. No work up was pursued at that time. She denied any chest pain. Ultimately, her weakness continued to progress to the point that she was unable to stand from a seated postion, prompting her to be [facility] for [facility]. Weight has remained stable.   Upon her [facility] to [facility] she was found to be in new onset a-fib with RVR, rates into the 160s at times, She was already on propranolol for her  anxiety. Her TSH was found to be in the 6 range with normal free T4. Echo showed EF 45-50%, dilated LA of 4.5 cm, dilated RA, moderate to severe MR, moderate TR, mildly elevated PASP at 45.2 mm Hg.  Head CT showed chronic infarcts along the right frontal and temporal lobes, with associated encephalomalacia.   She was started on low dose Eliquis 2.5 mg bid given her likely to be wheelchair bound (low risk of falls) and CHADSVASc score >= 6 giving her an annual estimated risk of stroke of 9%.     Allergies  Allergen Reactions  . Aliskiren-Amlodipine-Hctz Other (See Comments)  . Furosemide Other (See Comments)    Questionable.  . Sulfa Antibiotics Swelling    Outpatient Encounter Prescriptions as of 10/24/2014  Medication Sig  . Amantadine HCl 100 MG tablet Take 100 mg by mouth 2 (two) times daily.   . benzonatate (TESSALON) 200 MG capsule Take 200 mg by mouth every 6 (six) hours.  . carbamide peroxide (DEBROX) 6.5 % otic solution 5 drops 2 (two) times daily.  Marland Kitchen. docusate sodium (COLACE) 100 MG capsule Take 100 mg by mouth 2 (two) times daily.  Marland Kitchen. ELIQUIS 2.5 MG TABS tablet Take 2.5 mg by mouth.   . fexofenadine (ALLEGRA) 180 MG tablet Take 180 mg by mouth daily.  . furosemide (LASIX) 20 MG tablet Take 20 mg by mouth 2 (two) times daily.   Marland Kitchen. ipratropium-albuterol (DUONEB) 0.5-2.5 (3) MG/3ML SOLN Take 3 mLs by nebulization  every 4 (four) hours as needed.  Marland Kitchen levothyroxine (SYNTHROID, LEVOTHROID) 50 MCG tablet Take 50 mcg by mouth daily before breakfast.   . metoprolol (LOPRESSOR) 50 MG tablet Take 50 mg by mouth 2 (two) times daily.   . montelukast (SINGULAIR) 10 MG tablet Take 10 mg by mouth at bedtime.   . Multiple Vitamin (MULTIVITAMIN) tablet Take 1 tablet by mouth daily.  . mupirocin ointment (BACTROBAN) 2 % Apply 1 application topically 2 (two) times daily.   Marland Kitchen PARoxetine (PAXIL) 20 MG tablet Take 20 mg by mouth daily.  . potassium chloride SA (K-DUR,KLOR-CON) 20 MEQ tablet Take 20 mEq  by mouth daily.     Past Medical History  Diagnosis Date  . Persistent atrial fibrillation     a. Eliquis 2.5 mg bid  . Chronic systolic CHF (congestive heart failure)     a. echo 07/2014: EF 45-50%, dilated LA at 4.5 cm, mildly dilated RA, mod-sev MR, mod TR, mildly elevated PASP @ 45.2  . HTN (hypertension)   . Cognitive impairment   . Gait instability   . Depression   . Panic attacks   . Arrhythmia   . Arthritis   . Hyperlipidemia   . Parkinson's disease   . Anxiety disorder     History reviewed. No pertinent past surgical history.  Social History  reports that she has never smoked. She does not have any smokeless tobacco history on file. She reports that she does not drink alcohol or use illicit drugs.  Family History Family history is unknown by patient.   Most of the ROS details provided by her daughter, patient is relatively nonverbal Review of Systems  Constitutional: Negative.   Respiratory: Negative.   Cardiovascular: Negative.   Gastrointestinal: Negative.   Musculoskeletal: Negative.   Skin: Negative.   Neurological: Negative.   Hematological: Negative.   Psychiatric/Behavioral: Negative.   All other systems reviewed and are negative.   BP 120/80 mmHg  Pulse 64  Ht  (1.6 m)  Wt 160 lb (72.576 kg)  BMI 28.35 kg/m2   Physical Exam  Constitutional: She appears well-developed and well-nourished.  Presents in a wheelchair, relatively nonverbal  HENT:  Head: Normocephalic.  Nose: Nose normal.  Mouth/Throat: Oropharynx is clear and moist.  Eyes: Conjunctivae are normal. Pupils are equal, round, and reactive to light.  Neck: Normal range of motion. Neck supple. No JVD present.  Cardiovascular: Normal rate, S1 normal, S2 normal, normal heart sounds and intact distal pulses.  An irregularly irregular rhythm present. Exam reveals no gallop and no friction rub.   No murmur heard. Pulmonary/Chest: Effort normal and breath sounds normal. No respiratory  distress. She has no wheezes. She has no rales. She exhibits no tenderness.  Abdominal: Soft. Bowel sounds are normal. She exhibits no distension. There is no tenderness.  Musculoskeletal: Normal range of motion. She exhibits no edema or tenderness.  Lymphadenopathy:    She has no cervical adenopathy.  Neurological: She is alert. Coordination normal.  Skin: Skin is warm and dry. No rash noted. No erythema.  Psychiatric:    Assessment and Plan  Nursing note and vitals reviewed.

## 2014-10-24 NOTE — Assessment & Plan Note (Signed)
Daughter reports she is at her baseline. Appears comfortable

## 2014-10-30 NOTE — H&P (Signed)
PATIENT NAMElray Kirk:  Kirk, Anita MR#:  [id number] DATE OF BIRTH:  [birth year]  DATE OF ADMISSION:  07/15/2014  REFERRING PHYSICIAN: [name], MD    PRIMARY CARE DOCTOR: Wellsite geologistMedical Director at [facility].   ADMIT DIAGNOSIS: Atrial fibrillation with rapid ventricular rate.   HISTORY OF PRESENT ILLNESS: This is an 81 year old Caucasian female who presents to the Emergency Department after nearly falling at her assisted living facility. Her sister states that she has become progressively weaker over the last few weeks to the point now where she is unable to stand from a seated position. As her nursing aides were helping her to the shower today the patient nearly fell. She did not actually hit the ground nor did she injure herself or hit her head. There was no loss of consciousness, nausea or loss of continence. The patient denies having any problem with her vision when this happened and denies any chest pain or palpitations with this episode. However, she has been complaining to her sister in the last few days of feeling hot where she will pat her chest and fan her face. During those previous episodes, she has not fallen or had any indication of near syncope or loss of consciousness. Due to her overall weakness the patient was brought to the Emergency Department where she was found to have atrial fibrillation with intermittent rapid ventricular rate, which prompted the Emergency Department to call for admission.   REVIEW OF SYSTEMS:  CONSTITUTIONAL: The patient denies fever or malaise. The patient states that she is eating well.  EYES: Denies blurred vision or inflammation.  EARS, NOSE AND THROAT: Admits to sore throat. The patient thinks she is developing a cold.  RESPIRATORY: Denies cough or shortness of breath.  CARDIOVASCULAR: Denies chest pain or palpitations.  GASTROINTESTINAL: Denies nausea, vomiting, abdominal pain, or diarrhea.  GENITOURINARY: Denies dysuria, increased  frequency or hesitancy of urination.  ENDOCRINE: Denies polyuria or polydipsia.  HEMATOLOGIC AND LYMPHATIC: Denies easy bruising or bleeding.  MUSCULOSKELETAL: The patient denies arthralgias or myalgias at this time.  INTEGUMENT: Denies rashes or lesions.  NEUROLOGIC: Denies numbness in her extremities or dysarthria.  PSYCHIATRIC: Denies depression or suicidal ideation.   PAST MEDICAL HISTORY: Hypertension, gait instability, developmental disorder and panic disorder or anxiety.   SURGICAL HISTORY: None.   SOCIAL HISTORY: The patient lives in assisted living facility. She has been told in the past that she has mild mental retardation. She does not smoke, drink, or do any drugs.   FAMILY HISTORY: The patient's mother is deceased from complications of chronic kidney disease. Her father is deceased from a posterior brain aneurysm.   MEDICATIONS:  1.  Amantadine 100 mg 1 tablet p.o. b.i.d.  2.  Aspirin 81 mg 1 tablet p.o. daily.  3.  Bactroban 2% ointment apply to affected area 2 times a day to the left wrist and neck.  4.  Fexofenadine 180 mg 1 tablet p.o. every morning.  5.  Hydrochlorothiazide 25 mg 1 tablet p.o. daily.  6.  Montelukast 10 mg 1 tablet p.o. at bedtime.  7.  Multivitamin 1 tablet p.o. daily.  8.  Paroxetine 20 mg 1 tablet p.o. daily.  9.  Potassium chloride 20 mEq extended release 1 tablet p.o. daily.  10.  Propranolol 40 mg 1 tablet p.o. daily.   ALLERGIES: SULFA DRUGS.   PERTINENT LABORATORY RESULTS AND RADIOGRAPHIC FINDINGS: Serum glucose is 99, BUN is 18, creatinine 1.2, serum sodium 140, serum potassium is 3.3, chloride is  102, bicarbonate 29, calcium is 8.6, serum albumin is 3.1, alkaline phosphatase is 80,  AST is 26, ALT is 18, troponin is negative, white blood cell count is 8.8, hemoglobin is 11.6, hematocrit is 35.2, platelet count 236. MCV is 100,000. Urinalysis is negative for infection. Chest x-Anita shows stable cardiomegaly. There is some mild vascular  congestion and CT of the head without contrast shows no acute intracranial pathology. There is some mild cortical volume loss and scattered small vessel disease. There are chronic infarcts at the right frontal and temporal lobes with associated encephalomalacia.   PHYSICAL EXAMINATION:  VITAL SIGNS: Temperature is 97.4, pulse 82, respirations 18, blood pressure 109/64, pulse oximetry 96% on room air.  GENERAL: The patient is alert and oriented x 3 in no apparent distress.  HEENT: Normocephalic, atraumatic. Pupils equal, round, and reactive to light and accommodation. Extraocular movements are intact. Mucous membranes are moist.  NECK: Trachea is midline. No adenopathy. Thyroid is nonpalpable and nontender.  CHEST: Symmetric and atraumatic.  CARDIOVASCULAR: Regular rate and rhythm. Normal S1, S2. No rubs, clicks, or murmurs appreciated.  LUNGS: Clear to auscultation bilaterally. Normal effort and excursion.  ABDOMEN: Positive bowel sounds. Soft, nontender, nondistended. No hepatosplenomegaly.  GENITOURINARY: Deferred.  MUSCULOSKELETAL: The patient has a frozen right shoulder with associated deformity and decreased of motion. The patient also has a contracture of her right foot and inversion of her left foot. She has 4/5 strength in her upper and lower extremities bilaterally.  SKIN: There are no rashes or lesions and is warm and dry.  EXTREMITIES: No clubbing or cyanosis but there is trace edema of her ankles.  NEUROLOGIC: Cranial nerves II through XII are grossly intact.  PSYCHIATRIC: Mood is normal. Affect is congruent. The patient has fair insight into her condition. It is difficult to assess her judgment given her limited mental capacity.   ASSESSMENT AND PLAN: This is an 81 year old female admitted for new onset atrial fibrillation with rapid ventricular rate.  1.  Atrial fibrillation. The patient has paroxysmal episodes of rapid ventricular rate. At bedside I observed the patient's heart rate  to climb as high as 160 and then promptly come down to 80 to 100. Our goal at this time is rate control. The patient had been on propranolol presumably for anxiety, but I will change this medication to metoprolol as it has twice a day dosing and may improve the overall lability of the patient's heart rate.  2.  Hypothyroidism. In the Emergency Department I had added thyroid stimulating hormone check to her blood work which revealed mildly elevated TSH. I will start the patient on a weight appropriate dose of Synthroid. This may have something to do with her new onset atrial fibrillation, and if we can control the hypothyroidism, we may be able to control her arrhythmia.  3.  Hypertension. Continue hydrochlorothiazide.  4.  Gait instability. The patient is clearly a fall risk as this is acknowledged in her past medical history but also with the examination of her feet. She is in a weakened state and I have ordered physical therapy and occupational therapy while she is hospitalized. Also, this may affect our strategy as it regards anticoagulation for her atrial fibrillation.  5.  Panic disorder. Continue Paxil.  6.  Deep vein thrombosis prophylaxis. The patient is currently on therapeutic heparin.  7.  Gastrointestinal prophylaxis. None.   CODE STATUS: The patient is a full code.   TIME SPENT ON ADMISSION ORDERS AND PATIENT CARE: Approximately 45 minutes.  ____________________________ Kelton Pillar. Sheryle Hail, MD msd:AT D: 07/15/2014 04:50:20 ET T: 07/15/2014 05:39:08 ET JOB#: [id number]  cc: Kelton Pillar. Sheryle Hail, MD, <Dictator> Kelton Pillar Basilia Stuckert MD ELECTRONICALLY SIGNED 07/26/2014 2:22

## 2014-10-30 NOTE — Discharge Summary (Signed)
PATIENT NAMEELONI, [name] MR#:  [id number] DATE OF BIRTH:  20-Mar-1929  DATE OF ADMISSION:  07/15/2014 DATE OF DISCHARGE:  07/18/2014  ADMITTING PHYSICIAN: Kelton Pillar. Sheryle Hail, M.D.   DISCHARGING PHYSICIAN: Enid Baas, M.D.   PRIMARY CARE PHYSICIAN: Therapist, occupational.   CONSULTATIONS IN THE HOSPITAL:  1. Palliative care consultation by Dr. Harriett Sine Phifer.  2. Cardiology consultation by Dr. Kirke Corin.   DISCHARGE DIAGNOSES: 1. Atrial fibrillation with rapid ventricular response.  2. Congestive heart failure, ejection fraction of 45%, systolic congestive heart failure.  3. Hypertension.  4. Arthritis.  5. Hyperlipidemia.  6. Likely Parkinson disease versus benign essential tremors  7.  Gait instability.  8. Anxiety disorder.   DISCHARGE HOME MEDICATIONS:  1. Fexofenadine 180 mg p.o. daily.  2. Multivitamin 1 tablet p.o. daily.  3. Montelukast 10 mg p.o. daily.  4. Amantadine 100 mg p.o. b.i.d.  5. Paroxetine 20 mg p.o. daily.  6. Bactroban 2% topical ointment twice a day.  7. Potassium chloride 20 mEq once a day.  8. Prednisone taper over the next 4 days.  9. Eliquis 2.5 mg p.o. b.i.d.  10. Tessalon Perles 100 mg p.o. q.6 h. for 5 more days for cough.  11. Metoprolol 50 mg p.o. b.i.d.  12. DuoNebs 3 mL p.r.n. for shortness of breath and wheezing.  13. Synthroid 50 mcg p.o. daily.  14. Lasix 20 mg p.o. daily.   DISCHARGE DIET: Low-sodium diet.   DISCHARGE ACTIVITY: As tolerated.   HOME OXYGEN: None.    FOLLOWUP INSTRUCTIONS:  1. Home health, physical therapy and nursing.  2. Cardiology follow-up in 1 to 2 weeks.  3. PCP follow-up in 1 week.   LABORATORIES AND IMAGING STUDIES PRIOR TO DISCHARGE: Sodium 144, potassium 3.7, chloride 107, bicarbonate 30, BUN 25, creatinine 1.05, glucose 116, calcium of 8.8. Magnesium is 1.9.   Chest x-ray on 07/16/2014 showing cardiomegaly, trace of interstitial edema, small bilateral pleural effusions.   Echocardiogram  showing LVEF of 45% to 50%, severely dilated left atrium, and moderate to severe mitral regurgitation.   WBC 8.7, hemoglobin 10.1, hematocrit 30.6, platelet count is 200,000. Free t4 1.1.   BRIEF HOSPITAL COURSE: Ms. Christenbury is an 81 year old, elderly Caucasian female, with past medical history significant for anxiety disorder, hypertension, hyperlipidemia, developmental disorder, gait instability, progressive weakness, bedbound status mostly, presented to the hospital secondary to worsening weakness and near falls at the assisted living facility.  1. Progressive weakness and near falls, likely from atrial fibrillation versus worsening debility with Parkinson's and tremors. New onset atrial fibrillation, likely has chronic atrial fibrillation. Echocardiogram with dilated left atrium. No clots seen, placed on Eliquis, low-dose b.i.d., with eliminating the risk of falls as she has been mostly bedbound and wheelchair-bound at this time. Seen by the cardiologist, rate controlled with metoprolol at this time mostly at rest. Has not had exertional activity yet to see how the rate would respond.  2. Systolic CHF with mild exacerbation. Initially she received IV fluids that were suspended. Chest x-ray was done because the patient is having some dyspnea, cardiac wheezing and cough. It shows mild congestion. Since the EF is 45%, she was given a couple of doses of Lasix with improvement in her breathing. Today she started on low-dose Lasix with potassium supplement, and also on a prednisone taper and DuoNebs as needed.  3. Tremors. Could be early Parkinson's versus essential tremor. She was on propranolol, but that had had to be changed to metoprolol for her atrial fibrillation heart  rate control. She has already started on amantadine twice a day. Continue to monitor and adjust medications. No need to add another medication at this time.   Her course has been otherwise uneventful in the hospital. She was unable to work  with physical therapy. She will be discharged back to the assisted living facility with a wheelchair. If she will need a higher level of care, she will need to be referred to a long-term care facility.    DISCHARGE CONDITION: Guarded.   DISCHARGE DISPOSITION: Assisted living facility with home health.   CODE STATUS: DO NOT RESUSCITATE.   TIME SPENT ON DISCHARGE: 45 minutes.     ____________________________ Enid Baasadhika Gertie Broerman, MD rk:JT D: 07/18/2014 12:35:21 ET T: 07/18/2014 13:54:49 ET JOB#: [id number]  cc: Enid Baasadhika Elif Yonts, MD, <Dictator> Muhammad A. Kirke CorinArida, MD Enid BaasADHIKA Eragon Hammond MD ELECTRONICALLY SIGNED 07/18/2014 16:21

## 2014-10-30 NOTE — Consult Note (Signed)
General Aspect Primary Cardiologist: New to [facility] _____________  81 year old femalew ith history of HTN, gait instability, cognitive impairment, depression, and panic attacks who was admitted to [facility] on 07/15/2014 with a history of increasing weakness and found to be in new onset a-fib with RVR, rates into the 160s at times, currently in the low 100s to 1-teens.  _____________  PMH: 1. HTN 2. Gait instability 3. Cognitive impairment 4. Depression 5. Panic attack _____________   Present Illness 81 year old female with the above problem list who presented to [facility] on 07/15/2014 with a history of increasing weakness and found to be in new onset a-fib with RVR, rates into the 160s at times, currently in the low 100s to 1-teens. She is without known cardiac history. Never seen a cardiologist previously. She currently lives at [facility]. She has known cognitive impairment and has been followed by numerous doctors in the outpatient setting for this. Previously at [facility]. Now, most recently she sees Cristy Folks, NP. Over the past several months her fatigue has been increasing. Her gait has become weaker to the point that the option of a wheelchair has been brought up by several care providers, though she has not yet been placed in one. No history of falls. At times she has been too weak to get herself out of the shower, requiring the assistance of 2-3 nursing aids to get her out.   Over the past 2-3 weeks she has noted a sensation of "hot" and pat her chest. Per her sister her care providers felt like this was her anxiety. No work up was pursued at that time. She denies any chest pain. Ultimately, her weakness continued to progress to the point that she was unable to stand from a seated postion, prompting her to be brought to [facility] for further evaluation. She never did fall. She denied any chest pain or SOB. Weight has remained stable.   Upon her arrival to [facility] she was found to be  in new onset a-fib with RVR, rates into the 160s at times, mostly in the 1-teens to low 100s. She was already on propranolol for her anxiety. This was changed to metoprolol 25 mg bid for better rate control. Her TSH was found to be in the 6 range. K+ found to be 3.3. Magnesium to be ordered. Troponin negative x 1. Echo is pending. She is currently resting comfortably in her room.   Physical Exam:  GEN no acute distress, obese   HEENT hearing intact to voice   NECK supple   RESP normal resp effort  clear BS   CARD Irregular rate and rhythm  Tachycardic  Murmur   Murmur Systolic   ABD denies tenderness  soft   EXTR negative edema   SKIN normal to palpation   NEURO cranial nerves intact   PSYCH alert   Review of Systems:  ROS Pt not able to provide ROS  cognitive impairment   Family & Social History:  Family and Social History:  Family History mother: CKD; father: brain aneurysm   Social History negative tobacco, negative ETOH, negative Illicit drugs   Place of Living Nursing Home     Mental retardation:    Peripheral edema:    hypertension:    hypertension:   Home Medications: Medication Instructions Status  fexofenadine 180 mg oral tablet 1 tab(s) orally once a day (8 am) Active  multivitamin 1 tab(s) orally once a day (8 am) Active  aspirin  81 mg oral tablet, chewable 1 tab(s) orally once a day (8 am) Active  montelukast 10 mg oral tablet 1 tab(s) orally once a day (at bedtime) (8 pm) Active  amantadine 100 mg oral tablet 1 tab(s) orally 2 times a day Active  hydrochlorothiazide 25 mg oral tablet 1 tab(s) orally once a day Active  PARoxetine 20 mg oral tablet 1 tab(s) orally once a day Active  Bactroban 2% topical ointment Apply topically to affected area 2 times a day to left wrist and neck Active  potassium chloride 20 mEq oral tablet, extended release 1 tab(s) orally once a day Active  propranolol 40 mg oral tablet 1 tab(s) orally once a day Active    Lab Results:  Thyroid:  14-Jan-16 21:48   Thyroid Stimulating Hormone  6.69 (0.45-4.50 (IU = International Unit)  ----------------------- Pregnant patients have  different reference  ranges for TSH:  - - - - - - - - - -  Pregnant, first trimetser:  0.36 - 2.50 uIU/mL)  Hepatic:  14-Jan-16 21:48   Bilirubin, Total 0.4  Alkaline Phosphatase 80 (46-116 NOTE: New Reference Range 01/18/14)  SGPT (ALT) 18 (14-63 NOTE: New Reference Range 01/18/14)  SGOT (AST) 26  Total Protein, Serum 6.8  Albumin, Serum  3.1  Routine Chem:  14-Jan-16 21:48   Result Comment PT/APTT - SPECIMEN CLOTTED.C/ Bazine @  - [id number] 07-15-14 FOR RECOLLECT.  Result(s) reported on 15 Jul 2014 at 12:54AM.  Glucose, Serum 99  BUN 18  Creatinine (comp) 1.20  Sodium, Serum 140  Potassium, Serum  3.3  Chloride, Serum 102  CO2, Serum 29  Calcium (Total), Serum 8.6  Osmolality (calc) 281  eGFR (African American)  55  eGFR (Non-African American)  46 (eGFR values <39m/min/1.73 m2 may be an indication of chronic kidney disease (CKD). Calculated eGFR, using the MRDR Study equation, is useful in  patients with stable renal function. The eGFR calculation will not be reliable in acutely ill patients when serum creatinine is changing rapidly. It is not useful in patients on dialysis. The eGFR calculation may not be applicable to patients at the low and high extremes of body sizes, pregnant women, and vegetarians.)  Anion Gap 9  Cardiac:  14-Jan-16 21:48   Troponin I < 0.02 (0.00-0.05 0.05 ng/mL or less: NEGATIVE  Repeat testing in 3-6 hrs  if clinically indicated. >0.05 ng/mL: POTENTIAL  MYOCARDIAL INJURY. Repeat  testing in 3-6 hrs if  clinically indicated. NOTE: An increase or decrease  of 30% or more on serial  testing suggests a  clinically important change)  Routine Coag:  14-Jan-16 21:48   Activated PTT (APTT) - (A HCT value >55% may artifactually increase the APTT. In one study, the  increase was an average of 19%. Reference: "Effect on Routine and Special Coagulation Testing Values of Citrate Anticoagulant Adjustment in Patients with High HCT Values." American Journal of Clinical Pathology 2006;126:400-405.)  Prothrombin -  INR - (INR reference interval applies to patients on anticoagulant therapy. A single INR therapeutic range for coumarins is not optimal for all indications; however, the suggested range for most indications is 2.0 - 3.0. Exceptions to the INR Reference Range may include: Prosthetic heart valves, acute myocardial infarction, prevention of myocardial infarction, and combinations of aspirin and anticoagulant. The need for a higher or lower target INR must be assessed individually. Reference: The Pharmacology and Management of the Vitamin K  antagonists: the seventh ACCP Conference on Antithrombotic and Thrombolytic Therapy. CIOEVO.3500Sept:126 (3suppl): [id number] A  HCT value >55% may artifactually increase the PT.  In one study,  the increase was an average of 25%. Reference:  "Effect on Routine and Special Coagulation Testing Values of Citrate Anticoagulant Adjustment in Patients with High HCT Values." American Journal of Clinical Pathology 2006;126:400-405.)  Routine Hem:  14-Jan-16 21:48   WBC (CBC) -  RBC (CBC) -  Hemoglobin (CBC) -  Hematocrit (CBC) -  Platelet Count (CBC) -  MCV -  MCH -  MCHC -  RDW -  Neutrophil % 73.8  Lymphocyte % 10.9  Monocyte % 12.1  Eosinophil % 2.5  Basophil % 0.7  Neutrophil # 6.5  Lymphocyte # 1.0  Monocyte #  1.1  Eosinophil # 0.2  Basophil # 0.1  Reference Accession# [id number] (Result(s) reported on 14 Jul 2014 at 11:42PM.)  15-Jan-16 07:48   WBC (CBC) 6.9  RBC (CBC)  3.15  Hemoglobin (CBC)  10.4  Hematocrit (CBC)  31.2  Platelet Count (CBC) 214  MCV 99  MCH 33.1  MCHC 33.4  RDW 13.3  Neutrophil % 71.1  Lymphocyte % 11.6  Monocyte % 10.9  Eosinophil % 3.6  Basophil % 2.8  Neutrophil #  4.9  Lymphocyte #  0.8  Monocyte # 0.8  Eosinophil # 0.2  Basophil #  0.2 (Result(s) reported on 15 Jul 2014 at [facility].)   Radiology Results: XRay:    14-Jan-16 23:36, Chest Portable Single View  Chest Portable Single View   REASON FOR EXAM:    weakness, afib  COMMENTS:       PROCEDURE: DXR - DXR PORTABLE CHEST SINGLE VIEW  - Jul 14 2014 11:36PM     CLINICAL DATA:  Weakness, atrial fibrillation.    EXAM:  PORTABLE CHEST - 1 VIEW    COMPARISON:  02/07/2012    FINDINGS:  The heart is enlarged. Minimal vascular congestion without edema.  There is no pleural effusion or pneumothorax. No confluent airspace  disease. No acute osseous abnormalities.     IMPRESSION:  Stable cardiomegaly.  Mild vascular congestion.      Electronically Signed    By: Jeb Levering M.D.    On: 07/15/2014 00:41         Verified By: Rollene Fare. Marisue Humble, M.D.,  CT:    14-Jan-16 21:39, CT Head Without Contrast  CT Head Without Contrast   REASON FOR EXAM:    Weakness  COMMENTS:       PROCEDURE: CT  - CT HEAD WITHOUT CONTRAST  - Jul 14 2014  9:39PM     CLINICAL DATA:  Acute onset of worsening generalized weakness and  unsteady gait. Initial encounter.    EXAM:  CT HEAD WITHOUT CONTRAST    TECHNIQUE:  Contiguous axial images were obtained from the base of the skull  through the vertex without intravenous contrast.  COMPARISON:  None.    FINDINGS:  There is no evidence of acute infarction, mass lesion, or intra- or  extra-axial hemorrhage on CT.    Prominence of the ventricles and sulci reflects mild cortical volume  loss. Chronic infarcts are seen at the right frontal and temporal  lobes, with associated encephalomalacia. Scattered periventricular  and subcortical white matter change likely reflects small vessel  ischemic microangiopathy. Mild cerebellar atrophy is noted.    The brainstem and fourth ventricle are within normal limits. The  basal ganglia are unremarkable in  appearance, aside from minimal  involvement of the anterior limb of the right internal capsule by  the chronic infarct  described above. No mass effect or midline shift  is seen.    There is no evidence of fracture; visualized osseous structures are  unremarkable in appearance. The visualized portions of the orbits  are within normal limits. The paranasal sinuses and mastoid air  cells are well-aerated. No significant soft tissue abnormalities are  seen.     IMPRESSION:  1. No acute intracranial pathology seen on CT.  2. Mild cortical volume loss and scattered small vessel ischemic  microangiopathy.  3. Chronic infarcts at the right frontal and temporal lobes, with  associated encephalomalacia.  Electronically Signed    By: Garald Balding M.D.    On: 07/14/2014 22:02         Verified By: JEFFREY. CHANG, M.D.,    Sulfa drugs: Unknown  Other -Explain in Comment Field: Unknown  Vital Signs/Nurse's Notes: **Vital Signs.:   15-Jan-16 08:37  Vital Signs Type Q 4hr  Temperature Temperature (F) 97.3  Celsius 36.2  Temperature Source oral  Pulse Pulse 84  Respirations Respirations 18  Systolic BP Systolic BP 962  Diastolic BP (mmHg) Diastolic BP (mmHg) 74  Mean BP 89  Pulse Ox % Pulse Ox % 97  Pulse Ox Activity Level  At rest  Oxygen Delivery Room Air/ 21 %    Impression 81 year old femalew ith history of HTN, gait instability, cognitive impairment, depression, and panic attacks who was admitted to [facility] on 07/15/2014 with a history of increasing weakness and found to be in new onset a-fib with RVR, rates into the 160s at times, currently in the low 100s to 1-teens.   1. New onset a-fib: -Rates have improved to the low 100s to 1-teens on Lopressor 25 mg bid -Goal rate less than 100 -Current pressure 119/74 -Can add diltiazem 30 mg q 6 hours with hold parameters for rate control -Would want to avoid tachy-mediated cardiomyopathy  -With her gait instability and increasing  weakness she would not be a good candidate at this time for long term anticoagulation  -CHADSVASc at least 4, giving her an annual estimated risk of stroke at 4.0%  2. Hypokalemia: -3.3 upon admission, will replete to 4.0 -Check Mg, if low will need to replete to 2.0  3. Hypothyroidism: -Per IM  4. HTN: -Hold HCTZ in an effort for more BP room if needed for rate control -Controlled  5. Gait instability: -PT has been consulted   Electronic Signatures for Addendum Section:  Kathlyn Sacramento (MD) (Signed Addendum 15-Jan-16 16:48)  The patient was seen and examined. Agree with the above. She presnted with newly diagnosed A-fib with RVR. She has limited mobility and history of frequest falls. No murmurs by exam.  Agree with rate control with Metoprolol and Diltiazem. Due to frequent falls, I don't think she is a good candidate for anticoagulation.   Electronic Signatures: Kathlyn Sacramento (MD)  (Signed 15-Jan-16 16:48)  Co-Signer: General Aspect/Present Illness, History and Physical Exam, Review of System, Family & Social History, Past Medical History, Home Medications, Labs, Radiology, Allergies, Vital Signs/Nurse's Notes, Impression/Plan Rise Mu (PA-C)  (Signed 15-Jan-16 10:11)  Authored: General Aspect/Present Illness, History and Physical Exam, Review of System, Family & Social History, Past Medical History, Home Medications, Labs, Radiology, Allergies, Vital Signs/Nurse's Notes, Impression/Plan   Last Updated: 15-Jan-16 16:48 by Kathlyn Sacramento (MD)

## 2015-01-23 ENCOUNTER — Telehealth: Payer: Self-pay

## 2015-01-23 NOTE — Telephone Encounter (Signed)
Pt sister states she has been concerned about pt b/c of swelling, has been wheezing, has also used Albuterol  Pt c/o swelling: STAT is pt has developed SOB within 24 hours  1. How long have you been experiencing swelling? Past couple days  2. Where is the swelling located? Legs and ankles, bilateral , up higher than normal, sister states she had a hard time pulling her pants up yesterday  3.  Are you currently taking a "fluid pill"? yes  4.  Are you currently SOB? A little, "maybe"  5.  Have you traveled recently? no

## 2015-01-23 NOTE — Telephone Encounter (Signed)
Pt sister called back, states the home health nurse states pt has not had a fluid pill since 6/30. States somehow it has "fallen off her chart". States the Home Health nurse recommends pt be started back on her fluid pill in the morning. Please call.

## 2015-01-23 NOTE — Telephone Encounter (Signed)
Spoke w/ pt's sister Kathie Rhodes.  Advised her that our records indicate that pt is taking lasix 20 mg BID. She states that pt has not been taking any diuretic since 6/30. Reports that MD at facility wanted pt to take 40 mg once daily, as pt is up at night urinating.  Advised her to have pt take lasix 20 mg in the am and the 2nd dose no later than 2 pm to avoid nocturia.  Reiterated this to Erie Noe, home health nurse, she asks that I fax this to [phone].

## 2015-01-24 NOTE — Telephone Encounter (Signed)
Faxed again to [phone].

## 2015-02-24 ENCOUNTER — Encounter: Payer: Self-pay | Admitting: Cardiovascular Disease

## 2015-02-24 ENCOUNTER — Ambulatory Visit (INDEPENDENT_AMBULATORY_CARE_PROVIDER_SITE_OTHER): Payer: Medicare Other | Admitting: Cardiovascular Disease

## 2015-02-24 VITALS — BP 135/81 | HR 66 | Ht 63.0 in | Wt 160.0 lb

## 2015-02-24 DIAGNOSIS — R600 Localized edema: Secondary | ICD-10-CM

## 2015-02-24 DIAGNOSIS — I10 Essential (primary) hypertension: Secondary | ICD-10-CM | POA: Diagnosis not present

## 2015-02-24 DIAGNOSIS — I5022 Chronic systolic (congestive) heart failure: Secondary | ICD-10-CM | POA: Diagnosis not present

## 2015-02-24 DIAGNOSIS — R2681 Unsteadiness on feet: Secondary | ICD-10-CM

## 2015-02-24 DIAGNOSIS — I482 Chronic atrial fibrillation, unspecified: Secondary | ICD-10-CM

## 2015-02-24 NOTE — Assessment & Plan Note (Signed)
Blood pressure is well controlled on today's visit. No changes made to the medications. 

## 2015-02-24 NOTE — Assessment & Plan Note (Signed)
Currently working with PT. Unable to ambulate more than several steps at a time

## 2015-02-24 NOTE — Assessment & Plan Note (Signed)
I suspect her leg edema is secondary to venous insufficiency, also doubly even component of chronic lymphedema Unable to exclude small component of fluid overload from CHF We'll continue Lasix 20 mg twice a day for now, lab work as detailed

## 2015-02-24 NOTE — Assessment & Plan Note (Signed)
Heart rate relatively well controlled. Tolerating anticoagulation. No recent falls 

## 2015-02-24 NOTE — Progress Notes (Signed)
Patient ID: [name], female    DOB: 07/23/1928, 81 y.o.   MRN: [id number]  HPI Comments: [name] is a 81 year old woman with history of HTN, gait instability, cognitive impairment, depression, and panic attacks who returns for routine follow up of a-fib with RVR and chronic systolic CHF.   She currently lives at [facility].   She presents today and he wheelchair. Daughter presents with her. Daughter reports that Lasix was held by mistake for several weeks. Visiting Dr. had change Lasix from 20 g twice a day to 40 mg once a day. No Lasix was given. At the beginning of August 2016, Lasix 20 g twice a day was restarted. They are not doing daily weights at the [facility]. Unable to weigh her on our scale today. Patient denies any symptoms, no shortness of breath. She has chronic leg edema. She is Connie wearing compression hose. She is followed by [facility].  EKG on today's visit shows atrial fibrillation with ventricular rate 60 up to 77 bpm  Other past medical history  essentially wheelchair bound as her legs are now very weak.  significant tremor.   eating well, participating in physical therapy. Daughter does not think there is a chance she will walk again  She was in the hospital January 2016 for atrial fibrillation, acute systolic CHF  admitted to [facility] 1/15-1/18/2016  She has known cognitive impairment.   Prior to her above admission January 2016, over the past 2-3 weeks she noted a sensation of "hot" and would pat her chest. Per her sister her care providers felt like this was her anxiety. No work up was pursued at that time. She denied any chest pain. Ultimately, her weakness continued to progress to the point that she was unable to stand from a seated postion, prompting her to be brought to [facility] for further evaluation. Weight has remained stable.   Upon her arrival to [facility] she was found to be in new onset a-fib with RVR, rates into the 160s at  times, She was already on propranolol for her anxiety. Her TSH was found to be in the 6 range with normal free T4. Echo showed EF 45-50%, dilated LA of 4.5 cm, dilated RA, moderate to severe MR, moderate TR, mildly elevated PASP at 45.2 mm Hg.  Head CT showed chronic infarcts along the right frontal and temporal lobes, with associated encephalomalacia.   She was started on low dose Eliquis 2.5 mg bid given her likely to be wheelchair bound (low risk of falls) and CHADSVASc score >= 6 giving her an annual estimated risk of stroke of 9%.     Allergies  Allergen Reactions  . Aliskiren-Amlodipine-Hctz Other (See Comments)  . Furosemide Other (See Comments)    Questionable.  . Sulfa Antibiotics Swelling    Outpatient Encounter Prescriptions as of 02/24/2015  Medication Sig  . Amantadine HCl 100 MG tablet Take 100 mg by mouth 2 (two) times daily.   . benzonatate (TESSALON) 200 MG capsule Take 200 mg by mouth every 6 (six) hours.  . carbamide peroxide (DEBROX) 6.5 % otic solution 5 drops 2 (two) times daily.  Marland Kitchen docusate sodium (COLACE) 100 MG capsule Take 100 mg by mouth 2 (two) times daily.  Marland Kitchen ELIQUIS 2.5 MG TABS tablet Take 2.5 mg by mouth 2 (two) times daily.   . fexofenadine (ALLEGRA) 180 MG tablet Take 180 mg by mouth daily.  . furosemide (LASIX) 20 MG tablet Take 20  mg by mouth 2 (two) times daily.   Marland Kitchen ipratropium-albuterol (DUONEB) 0.5-2.5 (3) MG/3ML SOLN Take 3 mLs by nebulization every 4 (four) hours as needed.  Marland Kitchen levothyroxine (SYNTHROID, LEVOTHROID) 50 MCG tablet Take 50 mcg by mouth daily after breakfast.   . metoprolol (LOPRESSOR) 50 MG tablet Take 50 mg by mouth 2 (two) times daily.   . montelukast (SINGULAIR) 10 MG tablet Take 10 mg by mouth at bedtime.   . Multiple Vitamin (MULTIVITAMIN) tablet Take 1 tablet by mouth daily.  . mupirocin ointment (BACTROBAN) 2 % Apply 1 application topically 2 (two) times daily.   Marland Kitchen PARoxetine (PAXIL) 20 MG tablet Take 20 mg by mouth daily.  .  potassium chloride SA (K-DUR,KLOR-CON) 20 MEQ tablet Take 20 mEq by mouth daily.    No facility-administered encounter medications on file as of 02/24/2015.    Past Medical History  Diagnosis Date  . Persistent atrial fibrillation     a. Eliquis 2.5 mg bid  . Chronic systolic CHF (congestive heart failure)     a. echo 07/2014: EF 45-50%, dilated LA at 4.5 cm, mildly dilated RA, mod-sev MR, mod TR, mildly elevated PASP @ 45.2  . HTN (hypertension)   . Cognitive impairment   . Gait instability   . Depression   . Panic attacks   . Arrhythmia   . Arthritis   . Hyperlipidemia   . Parkinson's disease   . Anxiety disorder     History reviewed. No pertinent past surgical history.  Social History  reports that she has never smoked. She does not have any smokeless tobacco history on file. She reports that she does not drink alcohol or use illicit drugs.  Family History Family history is unknown by patient.  Review of Systems  Constitutional: Negative.   Respiratory: Negative.   Cardiovascular: Positive for leg swelling.  Gastrointestinal: Negative.   Musculoskeletal: Positive for gait problem.  Neurological: Negative.   Hematological: Negative.   Psychiatric/Behavioral: Negative.   All other systems reviewed and are negative.   BP 135/81 mmHg  Pulse 66  Ht 5\' 3"  (1.6 m)  Wt 160 lb (72.576 kg)  BMI 28.35 kg/m2   Physical Exam  Constitutional: She appears well-developed and well-nourished.  Presents in a wheelchair, relatively nonverbal  HENT:  Head: Normocephalic.  Nose: Nose normal.  Mouth/Throat: Oropharynx is clear and moist.  Eyes: Conjunctivae are normal. Pupils are equal, round, and reactive to light.  Neck: Normal range of motion. Neck supple. No JVD present.  Cardiovascular: Normal rate, S1 normal, S2 normal, normal heart sounds and intact distal pulses.  An irregularly irregular rhythm present. Exam reveals no gallop and no friction rub.   No murmur  heard. Pulmonary/Chest: Effort normal and breath sounds normal. No respiratory distress. She has no wheezes. She has no rales. She exhibits no tenderness.  Abdominal: Soft. Bowel sounds are normal. She exhibits no distension. There is no tenderness.  Musculoskeletal: Normal range of motion. She exhibits edema. She exhibits no tenderness.  Lymphadenopathy:    She has no cervical adenopathy.  Neurological: She is alert. Coordination normal.  Skin: Skin is warm and dry. No rash noted. No erythema.  Psychiatric:    Assessment and Plan  Nursing note and vitals reviewed.

## 2015-02-24 NOTE — Patient Instructions (Signed)
You are doing well. No medication changes were made.  We will check a BMP  And BNP today  Please call us if you have new issues that need to be addressed before your next appt.  Your physician wants you to follow-up in: 6 months.  You will receive a reminder letter in the mail two months in advance. If you don't receive a letter, please call our office to schedule the follow-up appointment.

## 2015-02-24 NOTE — Assessment & Plan Note (Signed)
We will order a basic metabolic panel and BNP today Appears relatively euvolemic Difficult to determine given her chronic leg edema. Unable to do daily weights at her facility.

## 2015-02-25 LAB — BASIC METABOLIC PANEL
BUN/Creatinine Ratio: 21 (ref 11–26)
BUN: 20 mg/dL (ref 8–27)
CO2: 27 mmol/L (ref 18–29)
CREATININE: 0.97 mg/dL (ref 0.57–1.00)
Calcium: 9.2 mg/dL (ref 8.7–10.3)
Chloride: 97 mmol/L (ref 97–108)
GFR calc Af Amer: 62 mL/min/{1.73_m2} (ref >59)
GFR, EST NON AFRICAN AMERICAN: 54 mL/min/{1.73_m2} — AB (ref >59)
Glucose: 103 mg/dL — ABNORMAL HIGH (ref 65–99)
Potassium: 3.6 mmol/L (ref 3.5–5.2)
SODIUM: 142 mmol/L (ref 134–144)

## 2015-02-25 LAB — BRAIN NATRIURETIC PEPTIDE: BNP: 96.5 pg/mL (ref 0.0–100.0)

## 2015-06-07 ENCOUNTER — Emergency Department: Payer: Medicare Other

## 2015-06-07 ENCOUNTER — Emergency Department
Admission: EM | Admit: 2015-06-07 | Discharge: 2015-06-08 | Disposition: A | Discharge status: Home / Self Care | Payer: Medicare Other | Attending: Emergency Medicine | Admitting: Emergency Medicine

## 2015-06-07 ENCOUNTER — Encounter: Payer: Self-pay | Admitting: Emergency Medicine

## 2015-06-07 DIAGNOSIS — R0602 Shortness of breath: Secondary | ICD-10-CM | POA: Diagnosis present

## 2015-06-07 DIAGNOSIS — I10 Essential (primary) hypertension: Secondary | ICD-10-CM | POA: Insufficient documentation

## 2015-06-07 DIAGNOSIS — Z79899 Other long term (current) drug therapy: Secondary | ICD-10-CM | POA: Diagnosis not present

## 2015-06-07 DIAGNOSIS — J40 Bronchitis, not specified as acute or chronic: Secondary | ICD-10-CM

## 2015-06-07 DIAGNOSIS — J209 Acute bronchitis, unspecified: Secondary | ICD-10-CM | POA: Insufficient documentation

## 2015-06-07 DIAGNOSIS — R05 Cough: Secondary | ICD-10-CM

## 2015-06-07 DIAGNOSIS — R059 Cough, unspecified: Secondary | ICD-10-CM

## 2015-06-07 MED ORDER — HYDROCOD POLST-CPM POLST ER 10-8 MG/5ML PO SUER
5.0000 mL | Freq: Once | ORAL | Status: AC
  Administered 2015-06-07: 5 mL via ORAL
  Filled 2015-06-07: qty 5

## 2015-06-07 NOTE — ED Provider Notes (Signed)
[facility] Emergency Department Provider Note  ____________________________________________  Time seen: Approximately 11:08 PM  I have reviewed the triage vital signs and the nursing notes.   HISTORY  Chief Complaint Shortness of Breath    HPI Cameshia Cressman is a 81 y.o. female who presents to the ED from nursing facility with a chief complaint of cough and shortness of breath. Patient has a medical history significant for atrial fibrillation on Eliquis, CHF with EF 45% who states she had a "coughing fit" approximately 2 hours prior to arrival. She notes a loose, bilaterally, nonproductive cough onset 1 day. Facility gave one breathing treatment and patient improved. Since that time, patient has had no complaints of breathing difficulties. Patient's daughter wanted her to be evaluated in the ED. + Sick contacts. Denies recent fever, chills, chest pain, abdominal pain, nausea, vomiting, diarrhea. Denies recent travel or trauma. Nothing makes her symptoms worse. Breathing treatment made her symptoms better.   Past Medical History  Diagnosis Date  . Persistent atrial fibrillation (HCC)     a. Eliquis 2.5 mg bid  . Chronic systolic CHF (congestive heart failure) (HCC)     a. echo 07/2014: EF 45-50%, dilated LA at 4.5 cm, mildly dilated RA, mod-sev MR, mod TR, mildly elevated PASP @ 45.2  . HTN (hypertension)   . Cognitive impairment   . Gait instability   . Depression   . Panic attacks   . Arrhythmia   . Arthritis   . Hyperlipidemia   . Parkinson's disease (HCC)   . Anxiety disorder     Patient Active Problem List   Diagnosis Date Noted  . Bilateral leg edema 10/24/2014  . Chronic atrial fibrillation (HCC) 08/23/2014  . Chronic systolic CHF (congestive heart failure) (HCC)   . Persistent atrial fibrillation (HCC)   . HTN (hypertension)   . Cognitive impairment   . Gait instability   . Depression   . Panic attacks     History reviewed. No pertinent  past surgical history.  Current Outpatient Rx  Name  Route  Sig  Dispense  Refill  . Amantadine HCl 100 MG tablet   Oral   Take 100 mg by mouth 2 (two) times daily.          Marland Kitchen docusate sodium (COLACE) 100 MG capsule   Oral   Take 100 mg by mouth 2 (two) times daily.         Marland Kitchen ELIQUIS 2.5 MG TABS tablet   Oral   Take 2.5 mg by mouth 2 (two) times daily.            Dispense as written.   . fexofenadine (ALLEGRA) 180 MG tablet   Oral   Take 180 mg by mouth daily.         . furosemide (LASIX) 20 MG tablet   Oral   Take 20 mg by mouth 2 (two) times daily.          Marland Kitchen ipratropium-albuterol (DUONEB) 0.5-2.5 (3) MG/3ML SOLN   Nebulization   Take 3 mLs by nebulization every 4 (four) hours as needed.         Marland Kitchen levothyroxine (SYNTHROID, LEVOTHROID) 50 MCG tablet   Oral   Take 50 mcg by mouth daily after breakfast.          . metoprolol (LOPRESSOR) 50 MG tablet   Oral   Take 50 mg by mouth 2 (two) times daily.          . montelukast (SINGULAIR)  10 MG tablet   Oral   Take 10 mg by mouth at bedtime.          . Multiple Vitamin (MULTIVITAMIN) tablet   Oral   Take 1 tablet by mouth daily.         Marland Kitchen PARoxetine (PAXIL) 20 MG tablet   Oral   Take 20 mg by mouth daily.         . potassium chloride SA (K-DUR,KLOR-CON) 20 MEQ tablet   Oral   Take 20 mEq by mouth daily.            Allergies Aliskiren-amlodipine-hctz and Sulfa antibiotics  Family History  Problem Relation Age of Onset  . Family history unknown: Yes    Social History Social History  Substance Use Topics  . Smoking status: Never Smoker   . Smokeless tobacco: None  . Alcohol Use: No    Review of Systems Constitutional: No fever/chills Eyes: No visual changes. ENT: No sore throat. Cardiovascular: Denies chest pain. Respiratory: Positive for nonproductive cough and shortness of breath. Gastrointestinal: No abdominal pain.  No nausea, no vomiting.  No diarrhea.  No  constipation. Genitourinary: Negative for dysuria. Musculoskeletal: Negative for back pain. Skin: Negative for rash. Neurological: Negative for headaches, focal weakness or numbness.  10-point ROS otherwise negative.  ____________________________________________   PHYSICAL EXAM:  VITAL SIGNS: ED Triage Vitals  Enc Vitals Group     BP 06/07/15 2204 159/81 mmHg     Pulse Rate 06/07/15 2204 74     Resp 06/07/15 2204 18     Temp 06/07/15 2204 98.1 F (36.7 C)     Temp Source 06/07/15 2204 Oral     SpO2 06/07/15 2204 98 %     Weight 06/07/15 2204 165 lb (74.844 kg)     Height 06/07/15 2204 5' (1.524 m)     Head Cir --      Peak Flow --      Pain Score --      Pain Loc --      Pain Edu? --      Excl. in GC? --     Constitutional: Alert and oriented. Well appearing and in no acute distress. Eyes: Conjunctivae are normal. PERRL. EOMI. Head: Atraumatic. Nose: No congestion/rhinnorhea. Mouth/Throat: Mucous membranes are moist.  Oropharynx non-erythematous. Neck: No stridor.  No carotid bruits. Cardiovascular: Normal rate, irregular rhythm. Grossly normal heart sounds.  Good peripheral circulation. Respiratory: Normal respiratory effort.  No retractions. Lungs CTAB. Loose, congested cough noted. Gastrointestinal: Soft and nontender. No distention. No abdominal bruits. No CVA tenderness. Musculoskeletal: No lower extremity tenderness. BLE in compression stockings secondary to edema.  No joint effusions. Neurologic:  Normal speech and language. No gross focal neurologic deficits are appreciated.  Skin:  Skin is warm, dry and intact. No rash noted. Psychiatric: Mood and affect are normal. Speech and behavior are normal.  ____________________________________________   LABS (all labs ordered are listed, but only abnormal results are displayed)  Labs Reviewed  CBC WITH DIFFERENTIAL/PLATELET - Abnormal; Notable for the following:    RBC 3.56 (*)    Hemoglobin 11.5 (*)    HCT  34.9 (*)    Neutro Abs 9.2 (*)    Lymphs Abs 0.7 (*)    All other components within normal limits  BASIC METABOLIC PANEL - Abnormal; Notable for the following:    Glucose, Bld 128 (*)    Creatinine, Ser 1.12 (*)    GFR calc non Af Amer 44 (*)  GFR calc Af Amer 51 (*)    All other components within normal limits  BRAIN NATRIURETIC PEPTIDE - Abnormal; Notable for the following:    B Natriuretic Peptide 170.0 (*)    All other components within normal limits  TROPONIN I   ____________________________________________  EKG  ED ECG REPORT I, SUNG,JADE J, the attending physician, personally viewed and interpreted this ECG.   Date: 06/07/2015  EKG Time: 2213  Rate: 72  Rhythm: atrial fibrillation, rate 72  Axis: Normal  Intervals:none  ST&T Change: Nonspecific  ____________________________________________  RADIOLOGY  Chest 2 view (viewed by me, interpreted per Dr. Cherly Hensenhang): Elevation of the right hemidiaphragm. Lungs remain grossly clear. Mild cardiomegaly noted.  CT chest interpreted per Dr. Cherly Hensenhang: 1. Minimal bilateral atelectasis or scarring noted. Mild bronchiectasis seen bilaterally, with diffuse tracheal and bronchial calcification. 2. Scattered coronary artery calcifications seen. 3. Apparent cholelithiasis noted. The gallbladder is somewhat distended, of uncertain significance. 4. Scattered calcification along the proximal abdominal aorta. 5. Significant anterior subluxation of the right humeral head, with associated joint effusion. Would correlate for associated symptoms. ____________________________________________   PROCEDURES  Procedure(s) performed: None  Critical Care performed: No  ____________________________________________   INITIAL IMPRESSION / ASSESSMENT AND PLAN / ED COURSE  Pertinent labs & imaging results that were available during my care of the patient were reviewed by me and considered in my medical decision making (see chart for  details).  81 year old female with a history of atrial fibrillation and congestive heart failure who presents with nonproductive cough and shortness of breath; improved after nebulizer treatment given prior to arrival. Chest x-ray notable for elevation of the right hemidiaphragm. Although patient's daughter tells me she was hospitalized in January 2016, I am unable to see prior chest x-rays. Will obtain screening lab work, give Tussionex for cough and obtain noncontrast CT chest to evaluate etiology of elevation of the right hemidiaphragm.  ----------------------------------------- 1:21 AM on 06/08/2015 -----------------------------------------  Patient resting in no acute distress. Room air saturations 98%. Updated patient and daughter of imaging results. Patient has no symptoms with her right shoulder. She has no abdominal tenderness on exam. Will initiate antibiotic treatment with Levaquin, Tussionex as needed for cough and follow-up with her doctor this week. Strict return precautions given. Both verbalize understanding and agree with plan of care. ____________________________________________   FINAL CLINICAL IMPRESSION(S) / ED DIAGNOSES  Final diagnoses:  Cough  Bronchitis      Irean HongJade J Sung, MD 06/08/15 [phone]

## 2015-06-07 NOTE — ED Notes (Signed)
Patient transported to X-ray 

## 2015-06-07 NOTE — ED Notes (Signed)
Patient transported to CT 

## 2015-06-07 NOTE — ED Notes (Signed)
Pt comes into the ED via EMS from [facility].  Patient was complaining of respiratory difficulty 2 hours ago.  Facility gave 1 breathing treatment and since then patient has had no complaints and no breathing difficulties.  Patient's daughter wanted patient brought to ED to be checked out.  Patient denies pain, respirations symmetrical and clear.  105/63, 95% room air, 70 HR.

## 2015-06-08 DIAGNOSIS — J209 Acute bronchitis, unspecified: Secondary | ICD-10-CM | POA: Diagnosis not present

## 2015-06-08 LAB — CBC WITH DIFFERENTIAL/PLATELET
Basophils Absolute: 0 10*3/uL (ref 0–0.1)
Basophils Relative: 0 %
EOS ABS: 0.1 10*3/uL (ref 0–0.7)
EOS PCT: 1 %
HCT: 34.9 % — ABNORMAL LOW (ref 35.0–47.0)
Hemoglobin: 11.5 g/dL — ABNORMAL LOW (ref 12.0–16.0)
LYMPHS ABS: 0.7 10*3/uL — AB (ref 1.0–3.6)
Lymphocytes Relative: 7 %
MCH: 32.3 pg (ref 26.0–34.0)
MCHC: 32.9 g/dL (ref 32.0–36.0)
MCV: 98 fL (ref 80.0–100.0)
MONO ABS: 0.8 10*3/uL (ref 0.2–0.9)
MONOS PCT: 8 %
Neutro Abs: 9.2 10*3/uL — ABNORMAL HIGH (ref 1.4–6.5)
Neutrophils Relative %: 84 %
PLATELETS: 268 10*3/uL (ref 150–440)
RBC: 3.56 MIL/uL — AB (ref 3.80–5.20)
RDW: 12.7 % (ref 11.5–14.5)
WBC: 10.9 10*3/uL (ref 3.6–11.0)

## 2015-06-08 LAB — BASIC METABOLIC PANEL
Anion gap: 8 (ref 5–15)
BUN: 20 mg/dL (ref 6–20)
CHLORIDE: 102 mmol/L (ref 101–111)
CO2: 32 mmol/L (ref 22–32)
CREATININE: 1.12 mg/dL — AB (ref 0.44–1.00)
Calcium: 9.3 mg/dL (ref 8.9–10.3)
GFR calc Af Amer: 51 mL/min — ABNORMAL LOW (ref >60)
GFR calc non Af Amer: 44 mL/min — ABNORMAL LOW (ref >60)
Glucose, Bld: 128 mg/dL — ABNORMAL HIGH (ref 65–99)
POTASSIUM: 3.6 mmol/L (ref 3.5–5.1)
SODIUM: 142 mmol/L (ref 135–145)

## 2015-06-08 LAB — TROPONIN I

## 2015-06-08 LAB — BRAIN NATRIURETIC PEPTIDE: B Natriuretic Peptide: 170 pg/mL — ABNORMAL HIGH (ref 0.0–100.0)

## 2015-06-08 MED ORDER — HYDROCOD POLST-CPM POLST ER 10-8 MG/5ML PO SUER: 5.0000 mL | Freq: Two times a day (BID) | ORAL | Status: DC

## 2015-06-08 MED ORDER — LEVOFLOXACIN 750 MG PO TABS
750.0000 mg | ORAL_TABLET | Freq: Once | ORAL | Status: AC
  Administered 2015-06-08: 750 mg via ORAL
  Filled 2015-06-08: qty 1

## 2015-06-08 MED ORDER — LEVOFLOXACIN 750 MG PO TABS: 750.0000 mg | ORAL_TABLET | Freq: Every day | ORAL | Status: DC

## 2015-06-08 NOTE — ED Notes (Signed)
MD at bedside. 

## 2015-06-08 NOTE — ED Notes (Signed)
Called facility to give report on patient and to let them know pt. Returning to facility via [facility].

## 2015-06-08 NOTE — ED Notes (Signed)
Reviewed d/c instructions and followup care with patient and caregiver.  Pt's caregiver verbalized understanding.

## 2015-06-08 NOTE — Discharge Instructions (Signed)
1. Finish antibiotic as prescribed (Levaquin 750 mg daily 4 days). 2. You may take cough medicine as needed (Tussionex). 3. Return to the ER for worsening symptoms, persistent vomiting, difficulty breathing or other concerns.  Cough, Adult Coughing is a reflex that clears your throat and your airways. Coughing helps to heal and protect your lungs. It is normal to cough occasionally, but a cough that happens with other symptoms or lasts a long time may be a sign of a condition that needs treatment. A cough may last only 2-3 weeks (acute), or it may last longer than 8 weeks (chronic). CAUSES Coughing is commonly caused by:  Breathing in substances that irritate your lungs.  A viral or bacterial respiratory infection.  Allergies.  Asthma.  Postnasal drip.  Smoking.  Acid backing up from the stomach into the esophagus (gastroesophageal reflux).  Certain medicines.  Chronic lung problems, including COPD (or rarely, lung cancer).  Other medical conditions such as heart failure. HOME CARE INSTRUCTIONS  Pay attention to any changes in your symptoms. Take these actions to help with your discomfort:  Take medicines only as told by your health care provider.  If you were prescribed an antibiotic medicine, take it as told by your health care provider. Do not stop taking the antibiotic even if you start to feel better.  Talk with your health care provider before you take a cough suppressant medicine.  Drink enough fluid to keep your urine clear or pale yellow.  If the air is dry, use a cold steam vaporizer or humidifier in your bedroom or your home to help loosen secretions.  Avoid anything that causes you to cough at work or at home.  If your cough is worse at night, try sleeping in a semi-upright position.  Avoid cigarette smoke. If you smoke, quit smoking. If you need help quitting, ask your health care provider.  Avoid caffeine.  Avoid alcohol.  Rest as needed. SEEK  MEDICAL CARE IF:   You have new symptoms.  You cough up pus.  Your cough does not get better after 2-3 weeks, or your cough gets worse.  You cannot control your cough with suppressant medicines and you are losing sleep.  You develop pain that is getting worse or pain that is not controlled with pain medicines.  You have a fever.  You have unexplained weight loss.  You have night sweats. SEEK IMMEDIATE MEDICAL CARE IF:  You cough up blood.  You have difficulty breathing.  Your heartbeat is very fast.   This information is not intended to replace advice given to you by your health care provider. Make sure you discuss any questions you have with your health care provider.   Document Released: 12/14/2010 Document Revised: 03/08/2015 Document Reviewed: 08/24/2014 Elsevier Interactive Patient Education [employer]2016 [employer] Inc.

## 2015-06-26 ENCOUNTER — Emergency Department
Admission: EM | Admit: 2015-06-26 | Discharge: 2015-06-26 | Disposition: A | Discharge status: Home / Self Care | Payer: Medicare Other | Attending: Emergency Medicine | Admitting: Emergency Medicine

## 2015-06-26 ENCOUNTER — Emergency Department: Payer: Medicare Other

## 2015-06-26 ENCOUNTER — Encounter: Payer: Self-pay | Admitting: Emergency Medicine

## 2015-06-26 DIAGNOSIS — J40 Bronchitis, not specified as acute or chronic: Secondary | ICD-10-CM | POA: Insufficient documentation

## 2015-06-26 DIAGNOSIS — Z792 Long term (current) use of antibiotics: Secondary | ICD-10-CM | POA: Diagnosis not present

## 2015-06-26 DIAGNOSIS — R05 Cough: Secondary | ICD-10-CM | POA: Diagnosis present

## 2015-06-26 DIAGNOSIS — I10 Essential (primary) hypertension: Secondary | ICD-10-CM | POA: Insufficient documentation

## 2015-06-26 DIAGNOSIS — Z79899 Other long term (current) drug therapy: Secondary | ICD-10-CM | POA: Insufficient documentation

## 2015-06-26 DIAGNOSIS — R059 Cough, unspecified: Secondary | ICD-10-CM

## 2015-06-26 LAB — CBC
HCT: 36 % (ref 35.0–47.0)
Hemoglobin: 12.2 g/dL (ref 12.0–16.0)
MCH: 33.2 pg (ref 26.0–34.0)
MCHC: 33.7 g/dL (ref 32.0–36.0)
MCV: 98.4 fL (ref 80.0–100.0)
PLATELETS: 234 10*3/uL (ref 150–440)
RBC: 3.66 MIL/uL — AB (ref 3.80–5.20)
RDW: 13.1 % (ref 11.5–14.5)
WBC: 12.1 10*3/uL — AB (ref 3.6–11.0)

## 2015-06-26 LAB — BASIC METABOLIC PANEL
ANION GAP: 7 (ref 5–15)
BUN: 20 mg/dL (ref 6–20)
CALCIUM: 9 mg/dL (ref 8.9–10.3)
CO2: 33 mmol/L — ABNORMAL HIGH (ref 22–32)
Chloride: 102 mmol/L (ref 101–111)
Creatinine, Ser: 1.01 mg/dL — ABNORMAL HIGH (ref 0.44–1.00)
GFR, EST AFRICAN AMERICAN: 58 mL/min — AB (ref >60)
GFR, EST NON AFRICAN AMERICAN: 50 mL/min — AB (ref >60)
GLUCOSE: 125 mg/dL — AB (ref 65–99)
POTASSIUM: 3.3 mmol/L — AB (ref 3.5–5.1)
Sodium: 142 mmol/L (ref 135–145)

## 2015-06-26 LAB — TROPONIN I: TROPONIN I: 0.03 ng/mL (ref <0.031)

## 2015-06-26 MED ORDER — HYDROCOD POLST-CPM POLST ER 10-8 MG/5ML PO SUER
5.0000 mL | Freq: Once | ORAL | Status: AC
  Administered 2015-06-26: 5 mL via ORAL
  Filled 2015-06-26: qty 5

## 2015-06-26 MED ORDER — PREDNISONE 20 MG PO TABS: 60.0000 mg | ORAL_TABLET | Freq: Every day | ORAL | Status: DC

## 2015-06-26 MED ORDER — IPRATROPIUM-ALBUTEROL 0.5-2.5 (3) MG/3ML IN SOLN
3.0000 mL | Freq: Once | RESPIRATORY_TRACT | Status: AC
  Administered 2015-06-26: 3 mL via RESPIRATORY_TRACT
  Filled 2015-06-26: qty 3

## 2015-06-26 MED ORDER — HYDROCOD POLST-CPM POLST ER 10-8 MG/5ML PO SUER: 5.0000 mL | Freq: Two times a day (BID) | ORAL | Status: DC

## 2015-06-26 MED ORDER — PREDNISONE 20 MG PO TABS
60.0000 mg | ORAL_TABLET | Freq: Once | ORAL | Status: AC
  Administered 2015-06-26: 60 mg via ORAL
  Filled 2015-06-26: qty 3

## 2015-06-26 NOTE — ED Notes (Signed)
Per [facility] pt [facility] know that she had a cough and roommate called sister at home who called 911. Per facility pt has no recent diagnosis of respiratory issues. Pt reports no production with cough. NAD at this time.

## 2015-06-26 NOTE — ED Provider Notes (Signed)
[facility] Emergency Department Provider Note  ____________________________________________  Time seen: Approximately 132 AM  I have reviewed the triage vital signs and the nursing notes.   HISTORY  Chief Complaint Cough    HPI [name] is a 81 y.o. female who comes into the hospital with a cough. According to her daughter she's been coughing for the last 2 days. She reports that at her nursing home she thinks she may have had a temperature to 99.2 so she decided to have her brought in. The patient was here 2-1/2 weeks ago and was placed on levofloxacin for possible pneumonia. The patient was taken a full dose of 750 mg but was decreased to 500 mg at her facility. The patient has also been on Mucinex but according to the daughter the patient's cough has been getting worse and worse. The patient has not had any medication aside from the Mucinex and the levofloxacin. The patient's family was concerned about a possible pneumonia so she brought her in for evaluation.   Past Medical History  Diagnosis Date  . Persistent atrial fibrillation (HCC)     a. Eliquis 2.5 mg bid  . Chronic systolic CHF (congestive heart failure) (HCC)     a. echo 07/2014: EF 45-50%, dilated LA at 4.5 cm, mildly dilated RA, mod-sev MR, mod TR, mildly elevated PASP @ 45.2  . HTN (hypertension)   . Cognitive impairment   . Gait instability   . Depression   . Panic attacks   . Arrhythmia   . Arthritis   . Hyperlipidemia   . Parkinson's disease (HCC)   . Anxiety disorder     Patient Active Problem List   Diagnosis Date Noted  . Bilateral leg edema 10/24/2014  . Chronic atrial fibrillation (HCC) 08/23/2014  . Chronic systolic CHF (congestive heart failure) (HCC)   . Persistent atrial fibrillation (HCC)   . HTN (hypertension)   . Cognitive impairment   . Gait instability   . Depression   . Panic attacks     Past Surgical History  Procedure Laterality Date  . Eye surgery       Current Outpatient Rx  Name  Route  Sig  Dispense  Refill  . Amantadine HCl 100 MG tablet   Oral   Take 100 mg by mouth 2 (two) times daily.          Marland Kitchen docusate sodium (COLACE) 100 MG capsule   Oral   Take 100 mg by mouth 2 (two) times daily.         Marland Kitchen ELIQUIS 2.5 MG TABS tablet   Oral   Take 2.5 mg by mouth 2 (two) times daily.            Dispense as written.   . fexofenadine (ALLEGRA) 180 MG tablet   Oral   Take 180 mg by mouth daily.         . furosemide (LASIX) 20 MG tablet   Oral   Take 20 mg by mouth 2 (two) times daily.          Marland Kitchen ipratropium-albuterol (DUONEB) 0.5-2.5 (3) MG/3ML SOLN   Nebulization   Take 3 mLs by nebulization every 6 (six) hours as needed.          Marland Kitchen levothyroxine (SYNTHROID, LEVOTHROID) 50 MCG tablet   Oral   Take 50 mcg by mouth daily after breakfast.          . metoprolol (LOPRESSOR) 50 MG tablet   Oral  Take 50 mg by mouth 2 (two) times daily.          . montelukast (SINGULAIR) 10 MG tablet   Oral   Take 10 mg by mouth at bedtime.          . Multiple Vitamin (MULTIVITAMIN) tablet   Oral   Take 1 tablet by mouth daily.         Marland Kitchen PARoxetine (PAXIL) 20 MG tablet   Oral   Take 20 mg by mouth daily.         . potassium chloride SA (K-DUR,KLOR-CON) 20 MEQ tablet   Oral   Take 20 mEq by mouth daily.          . chlorpheniramine-HYDROcodone (TUSSIONEX PENNKINETIC ER) 10-8 MG/5ML SUER   Oral   Take 5 mLs by mouth 2 (two) times daily. Patient not taking: Reported on 06/26/2015   70 mL   0   . chlorpheniramine-HYDROcodone (TUSSIONEX PENNKINETIC ER) 10-8 MG/5ML SUER   Oral   Take 5 mLs by mouth 2 (two) times daily.   140 mL   0   . levofloxacin (LEVAQUIN) 750 MG tablet   Oral   Take 1 tablet (750 mg total) by mouth daily. Patient not taking: Reported on 06/26/2015   4 tablet   0   . predniSONE (DELTASONE) 20 MG tablet   Oral   Take 3 tablets (60 mg total) by mouth daily.   12 tablet   0      Allergies Aliskiren-amlodipine-hctz and Sulfa antibiotics  Family History  Problem Relation Age of Onset  . Family history unknown: Yes    Social History Social History  Substance Use Topics  . Smoking status: Never Smoker   . Smokeless tobacco: None  . Alcohol Use: No    Review of Systems Constitutional: No fever/chills Eyes: No visual changes. ENT: No sore throat. Cardiovascular: Denies chest pain. Respiratory: Cough and shortness of breath Gastrointestinal: No abdominal pain.  No nausea, no vomiting.  No diarrhea.  No constipation. Genitourinary: Negative for dysuria. Musculoskeletal: Negative for back pain. Skin: Negative for rash. Neurological: Negative for headaches, focal weakness or numbness.  10-point ROS otherwise negative.  ____________________________________________   PHYSICAL EXAM:  VITAL SIGNS: ED Triage Vitals  Enc Vitals Group     BP 06/26/15 0049 135/48 mmHg     Pulse Rate 06/26/15 0049 61     Resp 06/26/15 0049 12     Temp 06/26/15 0049 98.2 F (36.8 C)     Temp Source 06/26/15 0049 Oral     SpO2 06/26/15 0049 95 %     Weight 06/26/15 0049 150 lb (68.04 kg)     Height 06/26/15 0049  (1.702 m)     Head Cir --      Peak Flow --      Pain Score 06/26/15 0051 0     Pain Loc --      Pain Edu? --      Excl. in GC? --     Constitutional: Alert and oriented. Well appearing and in no acute distress. Eyes: Conjunctivae are normal. PERRL. EOMI. Head: Atraumatic. Nose: No congestion/rhinnorhea. Mouth/Throat: Mucous membranes are moist.  Oropharynx non-erythematous. Cardiovascular: Normal rate, regular rhythm. Grossly normal heart sounds.  Good peripheral circulation. Respiratory: Normal respiratory effort.  No retractions. Mildly diminished breath sounds Gastrointestinal: Soft and nontender. No distention. Positive bowel sounds Musculoskeletal: No lower extremity tenderness nor edema.   Neurologic:  Normal speech and language.  Skin:  Skin is warm, dry and intact.  Psychiatric: Mood and affect are normal.   ____________________________________________   LABS (all labs ordered are listed, but only abnormal results are displayed)  Labs Reviewed  CBC - Abnormal; Notable for the following:    WBC 12.1 (*)    RBC 3.66 (*)    All other components within normal limits  BASIC METABOLIC PANEL - Abnormal; Notable for the following:    Potassium 3.3 (*)    CO2 33 (*)    Glucose, Bld 125 (*)    Creatinine, Ser 1.01 (*)    GFR calc non Af Amer 50 (*)    GFR calc Af Amer 58 (*)    All other components within normal limits  TROPONIN I   ____________________________________________  EKG  ED ECG REPORT I, Rebecka ApleyWebster,  Myldred Raju P, the attending physician, personally viewed and interpreted this ECG.   Date: 06/26/2015  EKG Time: 212  Rate: 79  Rhythm: atrial fibrillation, rate 79  Axis: normal  Intervals:none  ST&T Change: none  ____________________________________________  RADIOLOGY  CXR: No active cardiopulmonary disease ____________________________________________   PROCEDURES  Procedure(s) performed: None  Critical Care performed: No  ____________________________________________   INITIAL IMPRESSION / ASSESSMENT AND PLAN / ED COURSE  Pertinent labs & imaging results that were available during my care of the patient were reviewed by me and considered in my medical decision making (see chart for details).  This is an 81 year old female who comes in today with a cough that has been persistent for the last couple of weeks. The patient has been on levofloxacin but has had persistent cough. The patient does not appear to have a pneumonia at this time but may have some bronchospasm. The patient did have some wheezing per the nurse initially so I did give her a DuoNeb. After receiving the results of the patient's x-ray and blood work I ordered prednisone as well as another DuoNeb. The patient received a dose of  Tussionex which seemed to help her cough. She will be discharged home. ____________________________________________   FINAL CLINICAL IMPRESSION(S) / ED DIAGNOSES  Final diagnoses:  Bronchitis  Cough      Rebecka ApleyAllison P Aigner Horseman, MD 06/26/15 [phone]

## 2015-06-26 NOTE — Discharge Instructions (Signed)
Cough, Adult °Coughing is a reflex that clears your throat and your airways. Coughing helps to heal and protect your lungs. It is normal to cough occasionally, but a cough that happens with other symptoms or lasts a long time may be a sign of a condition that needs treatment. A cough may last only 2-3 weeks (acute), or it may last longer than 8 weeks (chronic). °CAUSES °Coughing is commonly caused by: °· Breathing in substances that irritate your lungs. °· A viral or bacterial respiratory infection. °· Allergies. °· Asthma. °· Postnasal drip. °· Smoking. °· Acid backing up from the stomach into the esophagus (gastroesophageal reflux). °· Certain medicines. °· Chronic lung problems, including COPD (or rarely, lung cancer). °· Other medical conditions such as heart failure. °HOME CARE INSTRUCTIONS  °Pay attention to any changes in your symptoms. Take these actions to help with your discomfort: °· Take medicines only as told by your health care provider. °· If you were prescribed an antibiotic medicine, take it as told by your health care provider. Do not stop taking the antibiotic even if you start to feel better. °· Talk with your health care provider before you take a cough suppressant medicine. °· Drink enough fluid to keep your urine clear or pale yellow. °· If the air is dry, use a cold steam vaporizer or humidifier in your bedroom or your home to help loosen secretions. °· Avoid anything that causes you to cough at work or at home. °· If your cough is worse at night, try sleeping in a semi-upright position. °· Avoid cigarette smoke. If you smoke, quit smoking. If you need help quitting, ask your health care provider. °· Avoid caffeine. °· Avoid alcohol. °· Rest as needed. °SEEK MEDICAL CARE IF:  °· You have new symptoms. °· You cough up pus. °· Your cough does not get better after 2-3 weeks, or your cough gets worse. °· You cannot control your cough with suppressant medicines and you are losing sleep. °· You  develop pain that is getting worse or pain that is not controlled with pain medicines. °· You have a fever. °· You have unexplained weight loss. °· You have night sweats. °SEEK IMMEDIATE MEDICAL CARE IF: °· You cough up blood. °· You have difficulty breathing. °· Your heartbeat is very fast. °  °This information is not intended to replace advice given to you by your health care provider. Make sure you discuss any questions you have with your health care provider. °  °Document Released: 12/14/2010 Document Revised: 03/08/2015 Document Reviewed: 08/24/2014 °Elsevier Interactive Patient Education ©2016 [employer] ° °Upper Respiratory Infection, Adult °Most upper respiratory infections (URIs) are a viral infection of the air passages leading to the lungs. A URI affects the nose, throat, and upper air passages. The most common type of URI is nasopharyngitis and is typically referred to as "the common cold." °URIs run their course and usually go away on their own. Most of the time, a URI does not require medical attention, but sometimes a bacterial infection in the upper airways can follow a viral infection. This is called a secondary infection. Sinus and middle ear infections are common types of secondary upper respiratory infections. °Bacterial pneumonia can also complicate a URI. A URI can worsen asthma and chronic obstructive pulmonary disease (COPD). Sometimes, these complications can require emergency medical care and may be life threatening.  °CAUSES °Almost all URIs are caused by viruses. A virus is a type of germ and can spread from one   person to another.  °RISKS FACTORS °You may be at risk for a URI if:  °· You smoke.   °· You have chronic heart or lung disease. °· You have a weakened defense (immune) system.   °· You are very young or very old.   °· You have nasal allergies or asthma. °· You work in crowded or poorly ventilated areas. °· You work in health care facilities or schools. °SIGNS AND SYMPTOMS    °Symptoms typically develop 2-3 days after you come in contact with a cold virus. Most viral URIs last 7-10 days. However, viral URIs from the influenza virus (flu virus) can last 14-18 days and are typically more severe. Symptoms may include:  °· Runny or stuffy (congested) nose.   °· Sneezing.   °· Cough.   °· Sore throat.   °· Headache.   °· Fatigue.   °· Fever.   °· Loss of appetite.   °· Pain in your forehead, behind your eyes, and over your cheekbones (sinus pain). °· Muscle aches.   °DIAGNOSIS  °Your health care provider may diagnose a URI by: °· Physical exam. °· Tests to check that your symptoms are not due to another condition such as: °¨ Strep throat. °¨ Sinusitis. °¨ Pneumonia. °¨ Asthma. °TREATMENT  °A URI goes away on its own with time. It cannot be cured with medicines, but medicines may be prescribed or recommended to relieve symptoms. Medicines may help: °· Reduce your fever. °· Reduce your cough. °· Relieve nasal congestion. °HOME CARE INSTRUCTIONS  °· Take medicines only as directed by your health care provider.   °· Gargle warm saltwater or take cough drops to comfort your throat as directed by your health care provider. °· Use a warm mist humidifier or inhale steam from a shower to increase air moisture. This may make it easier to breathe. °· Drink enough fluid to keep your urine clear or pale yellow.   °· Eat soups and other clear broths and maintain good nutrition.   °· Rest as needed.   °· Return to work when your temperature has returned to normal or as your health care provider advises. You may need to stay home longer to avoid infecting others. You can also use a face mask and careful hand washing to prevent spread of the virus. °· Increase the usage of your inhaler if you have asthma.   °· Do not use any tobacco products, including cigarettes, chewing tobacco, or electronic cigarettes. If you need help quitting, ask your health care provider. °PREVENTION  °The best way to protect  yourself from getting a cold is to practice good hygiene.  °· Avoid oral or hand contact with people with cold symptoms.   °· Wash your hands often if contact occurs.   °There is no clear evidence that vitamin C, vitamin E, echinacea, or exercise reduces the chance of developing a cold. However, it is always recommended to get plenty of rest, exercise, and practice good nutrition.  °SEEK MEDICAL CARE IF:  °· You are getting worse rather than better.   °· Your symptoms are not controlled by medicine.   °· You have chills. °· You have worsening shortness of breath. °· You have brown or red mucus. °· You have yellow or brown nasal discharge. °· You have pain in your face, especially when you bend forward. °· You have a fever. °· You have swollen neck glands. °· You have pain while swallowing. °· You have white areas in the back of your throat. °SEEK IMMEDIATE MEDICAL CARE IF:  °· You have severe or persistent: °¨ Headache. °¨ Ear pain. °¨   Sinus pain. °¨ Chest pain. °· You have chronic lung disease and any of the following: °¨ Wheezing. °¨ Prolonged cough. °¨ Coughing up blood. °¨ A change in your usual mucus. °· You have a stiff neck. °· You have changes in your: °¨ Vision. °¨ Hearing. °¨ Thinking. °¨ Mood. °MAKE SURE YOU:  °· Understand these instructions. °· Will watch your condition. °· Will get help right away if you are not doing well or get worse. °  °This information is not intended to replace advice given to you by your health care provider. Make sure you discuss any questions you have with your health care provider. °  °Document Released: 12/11/2000 Document Revised: 11/01/2014 Document Reviewed: 09/22/2013 °Elsevier Interactive Patient Education ©2016 Elsevier Inc. ° °

## 2015-08-13 ENCOUNTER — Emergency Department: Payer: Medicare Other

## 2015-08-13 ENCOUNTER — Inpatient Hospital Stay
Admission: EM | Admit: 2015-08-13 | Discharge: 2015-08-16 | DRG: 154 | Disposition: A | Discharge status: Home Health | Payer: Medicare Other | Attending: Internal Medicine | Admitting: Internal Medicine

## 2015-08-13 DIAGNOSIS — I110 Hypertensive heart disease with heart failure: Secondary | ICD-10-CM | POA: Diagnosis present

## 2015-08-13 DIAGNOSIS — Z993 Dependence on wheelchair: Secondary | ICD-10-CM | POA: Diagnosis not present

## 2015-08-13 DIAGNOSIS — Z79899 Other long term (current) drug therapy: Secondary | ICD-10-CM

## 2015-08-13 DIAGNOSIS — K1121 Acute sialoadenitis: Secondary | ICD-10-CM | POA: Diagnosis not present

## 2015-08-13 DIAGNOSIS — Z882 Allergy status to sulfonamides status: Secondary | ICD-10-CM

## 2015-08-13 DIAGNOSIS — A419 Sepsis, unspecified organism: Secondary | ICD-10-CM | POA: Diagnosis not present

## 2015-08-13 DIAGNOSIS — Z888 Allergy status to other drugs, medicaments and biological substances status: Secondary | ICD-10-CM

## 2015-08-13 DIAGNOSIS — F410 Panic disorder [episodic paroxysmal anxiety] without agoraphobia: Secondary | ICD-10-CM | POA: Diagnosis present

## 2015-08-13 DIAGNOSIS — E039 Hypothyroidism, unspecified: Secondary | ICD-10-CM | POA: Diagnosis present

## 2015-08-13 DIAGNOSIS — I482 Chronic atrial fibrillation: Secondary | ICD-10-CM | POA: Diagnosis present

## 2015-08-13 DIAGNOSIS — B9562 Methicillin resistant Staphylococcus aureus infection as the cause of diseases classified elsewhere: Secondary | ICD-10-CM | POA: Diagnosis present

## 2015-08-13 DIAGNOSIS — E785 Hyperlipidemia, unspecified: Secondary | ICD-10-CM | POA: Diagnosis present

## 2015-08-13 DIAGNOSIS — G9389 Other specified disorders of brain: Secondary | ICD-10-CM | POA: Diagnosis present

## 2015-08-13 DIAGNOSIS — I5023 Acute on chronic systolic (congestive) heart failure: Secondary | ICD-10-CM | POA: Diagnosis present

## 2015-08-13 DIAGNOSIS — R269 Unspecified abnormalities of gait and mobility: Secondary | ICD-10-CM | POA: Diagnosis present

## 2015-08-13 DIAGNOSIS — R1310 Dysphagia, unspecified: Secondary | ICD-10-CM | POA: Diagnosis present

## 2015-08-13 DIAGNOSIS — Z66 Do not resuscitate: Secondary | ICD-10-CM | POA: Diagnosis present

## 2015-08-13 DIAGNOSIS — G20 Parkinson's disease: Secondary | ICD-10-CM | POA: Diagnosis present

## 2015-08-13 DIAGNOSIS — G3184 Mild cognitive impairment, so stated: Secondary | ICD-10-CM | POA: Diagnosis present

## 2015-08-13 DIAGNOSIS — I4891 Unspecified atrial fibrillation: Secondary | ICD-10-CM | POA: Insufficient documentation

## 2015-08-13 DIAGNOSIS — Z7952 Long term (current) use of systemic steroids: Secondary | ICD-10-CM

## 2015-08-13 DIAGNOSIS — Z7901 Long term (current) use of anticoagulants: Secondary | ICD-10-CM

## 2015-08-13 DIAGNOSIS — E876 Hypokalemia: Secondary | ICD-10-CM | POA: Diagnosis not present

## 2015-08-13 DIAGNOSIS — K1120 Sialoadenitis, unspecified: Secondary | ICD-10-CM | POA: Diagnosis present

## 2015-08-13 LAB — COMPREHENSIVE METABOLIC PANEL
ALK PHOS: 101 U/L (ref 38–126)
ALT: 36 U/L (ref 14–54)
ANION GAP: 10 (ref 5–15)
AST: 43 U/L — ABNORMAL HIGH (ref 15–41)
Albumin: 2.9 g/dL — ABNORMAL LOW (ref 3.5–5.0)
BUN: 17 mg/dL (ref 6–20)
CALCIUM: 8.7 mg/dL — AB (ref 8.9–10.3)
CO2: 30 mmol/L (ref 22–32)
CREATININE: 0.95 mg/dL (ref 0.44–1.00)
Chloride: 101 mmol/L (ref 101–111)
GFR, EST NON AFRICAN AMERICAN: 53 mL/min — AB (ref >60)
Glucose, Bld: 114 mg/dL — ABNORMAL HIGH (ref 65–99)
Potassium: 2.8 mmol/L — CL (ref 3.5–5.1)
Sodium: 141 mmol/L (ref 135–145)
TOTAL PROTEIN: 6.9 g/dL (ref 6.5–8.1)
Total Bilirubin: 1 mg/dL (ref 0.3–1.2)

## 2015-08-13 LAB — CBC WITH DIFFERENTIAL/PLATELET
BASOS ABS: 0 10*3/uL (ref 0–0.1)
BASOS PCT: 0 %
EOS ABS: 0 10*3/uL (ref 0–0.7)
Eosinophils Relative: 0 %
HCT: 31.9 % — ABNORMAL LOW (ref 35.0–47.0)
HEMOGLOBIN: 10.8 g/dL — AB (ref 12.0–16.0)
Lymphocytes Relative: 4 %
Lymphs Abs: 0.4 10*3/uL — ABNORMAL LOW (ref 1.0–3.6)
MCH: 33 pg (ref 26.0–34.0)
MCHC: 33.9 g/dL (ref 32.0–36.0)
MCV: 97.5 fL (ref 80.0–100.0)
Monocytes Absolute: 1.1 10*3/uL — ABNORMAL HIGH (ref 0.2–0.9)
Monocytes Relative: 9 %
NEUTROS PCT: 87 %
Neutro Abs: 9.8 10*3/uL — ABNORMAL HIGH (ref 1.4–6.5)
Platelets: 244 10*3/uL (ref 150–440)
RBC: 3.27 MIL/uL — AB (ref 3.80–5.20)
RDW: 13.5 % (ref 11.5–14.5)
WBC: 11.4 10*3/uL — AB (ref 3.6–11.0)

## 2015-08-13 LAB — URINALYSIS COMPLETE WITH MICROSCOPIC (ARMC ONLY)
Bacteria, UA: NONE SEEN
Bilirubin Urine: NEGATIVE
Glucose, UA: NEGATIVE mg/dL
LEUKOCYTES UA: NEGATIVE
NITRITE: NEGATIVE
PH: 6 (ref 5.0–8.0)
PROTEIN: NEGATIVE mg/dL

## 2015-08-13 LAB — POTASSIUM: POTASSIUM: 3.5 mmol/L (ref 3.5–5.1)

## 2015-08-13 LAB — MAGNESIUM: MAGNESIUM: 1.8 mg/dL (ref 1.7–2.4)

## 2015-08-13 LAB — LACTIC ACID, PLASMA
LACTIC ACID, VENOUS: 1.3 mmol/L (ref 0.5–2.0)
LACTIC ACID, VENOUS: 1 mmol/L (ref 0.5–2.0)

## 2015-08-13 LAB — MRSA PCR SCREENING: MRSA BY PCR: POSITIVE — AB

## 2015-08-13 MED ORDER — LORATADINE 10 MG PO TABS
10.0000 mg | ORAL_TABLET | Freq: Every day | ORAL | Status: DC
  Administered 2015-08-13 – 2015-08-16 (×4): 10 mg via ORAL
  Filled 2015-08-13 (×4): qty 1

## 2015-08-13 MED ORDER — CHLORHEXIDINE GLUCONATE CLOTH 2 % EX PADS
6.0000 | MEDICATED_PAD | Freq: Every day | CUTANEOUS | Status: DC
Start: 2015-08-14 — End: 2015-08-16
  Administered 2015-08-14 – 2015-08-16 (×3): 6 via TOPICAL

## 2015-08-13 MED ORDER — SODIUM CHLORIDE 0.9 % IV BOLUS (SEPSIS)
1500.0000 mL | Freq: Once | INTRAVENOUS | Status: AC
  Administered 2015-08-13: 1500 mL via INTRAVENOUS

## 2015-08-13 MED ORDER — DEXAMETHASONE SODIUM PHOSPHATE 10 MG/ML IJ SOLN
10.0000 mg | Freq: Three times a day (TID) | INTRAMUSCULAR | Status: DC
  Administered 2015-08-13 – 2015-08-14 (×2): 10 mg via INTRAVENOUS
  Filled 2015-08-13 (×2): qty 1

## 2015-08-13 MED ORDER — LEVOTHYROXINE SODIUM 50 MCG PO TABS
50.0000 ug | ORAL_TABLET | Freq: Every day | ORAL | Status: DC
  Administered 2015-08-14 – 2015-08-16 (×3): 50 ug via ORAL
  Filled 2015-08-13 (×3): qty 1

## 2015-08-13 MED ORDER — IPRATROPIUM-ALBUTEROL 0.5-2.5 (3) MG/3ML IN SOLN
3.0000 mL | RESPIRATORY_TRACT | Status: DC | PRN
  Administered 2015-08-14: 3 mL via RESPIRATORY_TRACT

## 2015-08-13 MED ORDER — POTASSIUM CHLORIDE CRYS ER 20 MEQ PO TBCR
20.0000 meq | EXTENDED_RELEASE_TABLET | Freq: Two times a day (BID) | ORAL | Status: DC
  Administered 2015-08-13 – 2015-08-16 (×6): 20 meq via ORAL
  Filled 2015-08-13 (×6): qty 1

## 2015-08-13 MED ORDER — ACETAMINOPHEN 650 MG RE SUPP
975.0000 mg | Freq: Once | RECTAL | Status: AC
  Administered 2015-08-13: 975 mg via RECTAL
  Filled 2015-08-13: qty 1

## 2015-08-13 MED ORDER — IOHEXOL 300 MG/ML  SOLN
75.0000 mL | Freq: Once | INTRAMUSCULAR | Status: AC | PRN
  Administered 2015-08-13: 75 mL via INTRAVENOUS

## 2015-08-13 MED ORDER — METOPROLOL TARTRATE 50 MG PO TABS
50.0000 mg | ORAL_TABLET | Freq: Two times a day (BID) | ORAL | Status: DC
  Administered 2015-08-13 – 2015-08-14 (×2): 50 mg via ORAL
  Filled 2015-08-13 (×2): qty 1

## 2015-08-13 MED ORDER — MONTELUKAST SODIUM 10 MG PO TABS
10.0000 mg | ORAL_TABLET | Freq: Every day | ORAL | Status: DC
  Administered 2015-08-13 – 2015-08-15 (×3): 10 mg via ORAL
  Filled 2015-08-13 (×3): qty 1

## 2015-08-13 MED ORDER — PIPERACILLIN-TAZOBACTAM 3.375 G IVPB
3.3750 g | Freq: Three times a day (TID) | INTRAVENOUS | Status: DC
  Administered 2015-08-13 – 2015-08-16 (×9): 3.375 g via INTRAVENOUS
  Filled 2015-08-13 (×11): qty 50

## 2015-08-13 MED ORDER — PAROXETINE HCL 20 MG PO TABS
20.0000 mg | ORAL_TABLET | ORAL | Status: DC
  Administered 2015-08-14 – 2015-08-16 (×3): 20 mg via ORAL
  Filled 2015-08-13 (×3): qty 1

## 2015-08-13 MED ORDER — ACETAMINOPHEN 650 MG RE SUPP: 650.0000 mg | Freq: Four times a day (QID) | RECTAL | Status: DC | PRN

## 2015-08-13 MED ORDER — ONDANSETRON HCL 4 MG PO TABS: 4.0000 mg | ORAL_TABLET | Freq: Four times a day (QID) | ORAL | Status: DC | PRN

## 2015-08-13 MED ORDER — ACETAMINOPHEN 325 MG PO TABS: 650.0000 mg | ORAL_TABLET | Freq: Four times a day (QID) | ORAL | Status: DC | PRN

## 2015-08-13 MED ORDER — AMANTADINE HCL 100 MG PO CAPS
100.0000 mg | ORAL_CAPSULE | Freq: Two times a day (BID) | ORAL | Status: DC
  Administered 2015-08-13 – 2015-08-16 (×6): 100 mg via ORAL
  Filled 2015-08-13 (×8): qty 1

## 2015-08-13 MED ORDER — METOPROLOL TARTRATE 1 MG/ML IV SOLN: 5.0000 mg | INTRAVENOUS | Status: DC | PRN

## 2015-08-13 MED ORDER — MUPIROCIN 2 % EX OINT
1.0000 "application " | TOPICAL_OINTMENT | Freq: Two times a day (BID) | CUTANEOUS | Status: DC
  Administered 2015-08-13 – 2015-08-16 (×6): 1 via NASAL
  Filled 2015-08-13: qty 22

## 2015-08-13 MED ORDER — POTASSIUM CHLORIDE 10 MEQ/100ML IV SOLN
10.0000 meq | INTRAVENOUS | Status: AC
  Administered 2015-08-13 (×4): 10 meq via INTRAVENOUS
  Filled 2015-08-13 (×4): qty 100

## 2015-08-13 MED ORDER — PIPERACILLIN-TAZOBACTAM 4.5 G IVPB
4.5000 g | Freq: Once | INTRAVENOUS | Status: AC
  Administered 2015-08-13: 4.5 g via INTRAVENOUS
  Filled 2015-08-13: qty 100

## 2015-08-13 MED ORDER — VANCOMYCIN HCL IN DEXTROSE 1-5 GM/200ML-% IV SOLN
1000.0000 mg | INTRAVENOUS | Status: DC
  Administered 2015-08-14 – 2015-08-15 (×2): 1000 mg via INTRAVENOUS
  Filled 2015-08-13 (×3): qty 200

## 2015-08-13 MED ORDER — VANCOMYCIN HCL 10 G IV SOLR
1500.0000 mg | Freq: Once | INTRAVENOUS | Status: AC
  Administered 2015-08-13: 1500 mg via INTRAVENOUS
  Filled 2015-08-13: qty 1500

## 2015-08-13 MED ORDER — POTASSIUM CHLORIDE IN NACL 20-0.9 MEQ/L-% IV SOLN
INTRAVENOUS | Status: DC
  Administered 2015-08-13: 16:00:00 via INTRAVENOUS
  Filled 2015-08-13 (×2): qty 1000

## 2015-08-13 MED ORDER — OXYCODONE HCL 5 MG PO TABS: 5.0000 mg | ORAL_TABLET | ORAL | Status: DC | PRN

## 2015-08-13 MED ORDER — APIXABAN 2.5 MG PO TABS
2.5000 mg | ORAL_TABLET | Freq: Two times a day (BID) | ORAL | Status: DC
  Administered 2015-08-13 – 2015-08-14 (×2): 2.5 mg via ORAL
  Filled 2015-08-13 (×2): qty 1

## 2015-08-13 MED ORDER — DOCUSATE SODIUM 100 MG PO CAPS
100.0000 mg | ORAL_CAPSULE | Freq: Two times a day (BID) | ORAL | Status: DC
  Administered 2015-08-13 – 2015-08-15 (×4): 100 mg via ORAL
  Filled 2015-08-13 (×6): qty 1

## 2015-08-13 MED ORDER — POLYETHYLENE GLYCOL 3350 17 G PO PACK: 17.0000 g | PACK | Freq: Every day | ORAL | Status: DC | PRN

## 2015-08-13 MED ORDER — GUAIFENESIN 100 MG/5ML PO LIQD
200.0000 mg | Freq: Four times a day (QID) | ORAL | Status: DC | PRN
  Filled 2015-08-13: qty 10

## 2015-08-13 MED ORDER — DEXAMETHASONE SODIUM PHOSPHATE 10 MG/ML IJ SOLN
10.0000 mg | Freq: Once | INTRAMUSCULAR | Status: AC
  Administered 2015-08-13: 10 mg via INTRAVENOUS
  Filled 2015-08-13: qty 1

## 2015-08-13 MED ORDER — SODIUM CHLORIDE 0.9% FLUSH
3.0000 mL | Freq: Two times a day (BID) | INTRAVENOUS | Status: DC
  Administered 2015-08-13 – 2015-08-16 (×6): 3 mL via INTRAVENOUS

## 2015-08-13 MED ORDER — ONDANSETRON HCL 4 MG/2ML IJ SOLN: 4.0000 mg | Freq: Four times a day (QID) | INTRAMUSCULAR | Status: DC | PRN

## 2015-08-13 MED ORDER — ACETAMINOPHEN 500 MG PO TABS: 1000.0000 mg | ORAL_TABLET | Freq: Once | ORAL | Status: DC

## 2015-08-13 MED ORDER — IPRATROPIUM-ALBUTEROL 0.5-2.5 (3) MG/3ML IN SOLN
3.0000 mL | RESPIRATORY_TRACT | Status: DC | PRN
  Filled 2015-08-13: qty 3

## 2015-08-13 NOTE — ED Notes (Addendum)
Pt came via EMS from [place]. Pt reports waking up with right sided facial swelling and pain. Pts sister at bedside and reports she saw pt yesterday and the swe;;omg was not there. Pt had thrush 3 weeks ago per daughter and during that time pt ws having some trouble eating/drinking/swallowing. Pts sister thinks this issue may be coming back.

## 2015-08-13 NOTE — ED Notes (Signed)
Potassium 2.8. MD notified.

## 2015-08-13 NOTE — Progress Notes (Signed)
Patient admitted to floor. Alert however disoriented to time. Patient HOH, sister at bedside. No c/o pain. Airway patent, tolerating RA. Passed swallow study. Telemetry verified by cna keon and I box number [id number]. skin verified by second nurse ashley w. Will continue to monitor

## 2015-08-13 NOTE — Care Management (Signed)
ENT consult for submandibular gland infections.  IV antibiotics. Upon arrival to ED heart rate up to 180s.  She does have history of atrial fib.  Heart rate now stable.  Baseline is bed/wheelchair bound..She does have some lifelong cogn itive deficits.  From [facility].  Unsure at present if she is from the assisted living or memory care unit. CSW consult is present

## 2015-08-13 NOTE — Progress Notes (Signed)
Pharmacy Antibiotic Note  [name] is a 82 y.o. female admitted on 08/13/2015 with sepsis.  Pharmacy has been consulted for Vancomycin and Zosyn dosing.  Plan: Vancomycin 1000 IV every 24 hours.  Goal trough 15-20 mcg/mL.  Patient received Vancomycin 1500 mg IV once at 0932.  Therefore, patient will not need stacked dose based on load administered in ED.   SCr: 0.95, est CrCl~41.8 mL/min Ke: 0.039, t1/2: 17.8 h, Vd: 42.8 L  Continue patient on Zosyn 3.375 g IV q8h per EI protocol.   Height:  (162.6 cm) Weight: 156 lb 8.4 oz (71 kg) IBW/kg (Calculated) : 54.7  Temp (24hrs), Avg:99.7 F (37.6 C), Min:97.9 F (36.6 C), Max:100.7 F (38.2 C)   Recent Labs Lab 08/13/15 0845 08/13/15 1211  WBC 11.4*  --   CREATININE 0.95  --   LATICACIDVEN 1.3 1.0    Estimated Creatinine Clearance: 41.8 mL/min (by C-G formula based on Cr of 0.95).    Allergies  Allergen Reactions  . Aliskiren-Amlodipine-Hctz Other (See Comments)  . Sulfa Antibiotics Swelling    Antimicrobials this admission: Anti-infectives    Start     Dose/Rate Route Frequency Ordered Stop   08/14/15 0930  vancomycin (VANCOCIN) IVPB 1000 mg/200 mL premix     1,000 mg 200 mL/hr over 60 Minutes Intravenous Every 24 hours 08/13/15 1420     08/13/15 1530  piperacillin-tazobactam (ZOSYN) IVPB 3.375 g     3.375 g 12.5 mL/hr over 240 Minutes Intravenous 3 times per day 08/13/15 1420     08/13/15 0900  vancomycin (VANCOCIN) 1,500 mg in sodium chloride 0.9 % 500 mL IVPB     1,500 mg 250 mL/hr over 120 Minutes Intravenous  Once 08/13/15 0849 08/13/15 1132   08/13/15 0900  piperacillin-tazobactam (ZOSYN) IVPB 4.5 g     4.5 g 200 mL/hr over 30 Minutes Intravenous  Once 08/13/15 0849 08/13/15 0943      Dose adjustments this admission: Will check trough prior to 4th dose on 2/15 at 0900  Microbiology results: No results found for this or any previous visit.  Thank you for allowing pharmacy to be a part of this  patient's care.  Natausha Jungwirth G 08/13/2015 2:21 PM

## 2015-08-13 NOTE — ED Provider Notes (Signed)
[facility] Emergency Department Provider Note  ____________________________________________  Time seen: Approximately 8:50 AM  I have reviewed the triage vital signs and the nursing notes.   HISTORY  Chief Complaint Facial Swelling    HPI [name] is a 82 y.o. female nursing home patient with a history of afib on our request, CHF, HTN, dementia, Parkinson's disease, presenting with right facial swelling. Patient and her sister here who state that the patient was in her usual state of health without any abnormal findings last night. This morning she woke up with significant right ear pain and a very large amount of swelling over the right mandible. She is not recently had any dental procedures, and is a dentulous. She is not had any fever, nausea or vomiting, headache, stiff neck or trauma.   Past Medical History  Diagnosis Date  . Persistent atrial fibrillation (HCC)     a. Eliquis 2.5 mg bid  . Chronic systolic CHF (congestive heart failure) (HCC)     a. echo 07/2014: EF 45-50%, dilated LA at 4.5 cm, mildly dilated RA, mod-sev MR, mod TR, mildly elevated PASP @ 45.2  . HTN (hypertension)   . Cognitive impairment   . Gait instability   . Depression   . Panic attacks   . Arrhythmia   . Arthritis   . Hyperlipidemia   . Parkinson's disease (HCC)   . Anxiety disorder     Patient Active Problem List   Diagnosis Date Noted  . Bilateral leg edema 10/24/2014  . Chronic atrial fibrillation (HCC) 08/23/2014  . Chronic systolic CHF (congestive heart failure) (HCC)   . Persistent atrial fibrillation (HCC)   . HTN (hypertension)   . Cognitive impairment   . Gait instability   . Depression   . Panic attacks     Past Surgical History  Procedure Laterality Date  . Eye surgery      Current Outpatient Rx  Name  Route  Sig  Dispense  Refill  . Amantadine HCl 100 MG tablet   Oral   Take 100 mg by mouth 2 (two) times daily.          .  chlorpheniramine-HYDROcodone (TUSSIONEX PENNKINETIC ER) 10-8 MG/5ML SUER   Oral   Take 5 mLs by mouth 2 (two) times daily. Patient not taking: Reported on 06/26/2015   70 mL   0   . chlorpheniramine-HYDROcodone (TUSSIONEX PENNKINETIC ER) 10-8 MG/5ML SUER   Oral   Take 5 mLs by mouth 2 (two) times daily.   140 mL   0   . docusate sodium (COLACE) 100 MG capsule   Oral   Take 100 mg by mouth 2 (two) times daily.         Marland Kitchen ELIQUIS 2.5 MG TABS tablet   Oral   Take 2.5 mg by mouth 2 (two) times daily.            Dispense as written.   . fexofenadine (ALLEGRA) 180 MG tablet   Oral   Take 180 mg by mouth daily.         . furosemide (LASIX) 20 MG tablet   Oral   Take 20 mg by mouth 2 (two) times daily.          Marland Kitchen ipratropium-albuterol (DUONEB) 0.5-2.5 (3) MG/3ML SOLN   Nebulization   Take 3 mLs by nebulization every 6 (six) hours as needed.          Marland Kitchen levofloxacin (LEVAQUIN) 750 MG tablet   Oral  Take 1 tablet (750 mg total) by mouth daily. Patient not taking: Reported on 06/26/2015   4 tablet   0   . levothyroxine (SYNTHROID, LEVOTHROID) 50 MCG tablet   Oral   Take 50 mcg by mouth daily after breakfast.          . metoprolol (LOPRESSOR) 50 MG tablet   Oral   Take 50 mg by mouth 2 (two) times daily.          . montelukast (SINGULAIR) 10 MG tablet   Oral   Take 10 mg by mouth at bedtime.          . Multiple Vitamin (MULTIVITAMIN) tablet   Oral   Take 1 tablet by mouth daily.         Marland Kitchen PARoxetine (PAXIL) 20 MG tablet   Oral   Take 20 mg by mouth daily.         . potassium chloride SA (K-DUR,KLOR-CON) 20 MEQ tablet   Oral   Take 20 mEq by mouth daily.          . predniSONE (DELTASONE) 20 MG tablet   Oral   Take 3 tablets (60 mg total) by mouth daily.   12 tablet   0     Allergies Aliskiren-amlodipine-hctz and Sulfa antibiotics  Family History  Problem Relation Age of Onset  . Family history unknown: Yes    Social  History Social History  Substance Use Topics  . Smoking status: Never Smoker   . Smokeless tobacco: None  . Alcohol Use: No    Review of Systems Constitutional: No fever/chills. No lightheadedness. No trauma. Eyes: No visual changes. ENT: No sore throat. No dental problems. No pain inside the mouth. Positive right ear pain. Positive significant Right-sided facial swelling. Cardiovascular: Denies chest pain, palpitations. Respiratory: Denies shortness of breath.  No cough. Gastrointestinal: No abdominal pain.  No nausea, no vomiting.  No diarrhea.  No constipation. Genitourinary: Negative for dysuria. Musculoskeletal: Negative for back pain. Skin: Negative for rash. Neurological: Negative for headaches, focal weakness or numbness.  10-point ROS otherwise negative.  ____________________________________________   PHYSICAL EXAM:  VITAL SIGNS: ED Triage Vitals  Enc Vitals Group     BP 08/13/15 0823 137/46 mmHg     Pulse --      Resp --      Temp 08/13/15 0823 100.4 F (38 C)     Temp Source 08/13/15 0823 Oral     SpO2 08/13/15 0823 100 %     Weight 08/13/15 0823 156 lb 8.4 oz (71 kg)     Height 08/13/15 0823 5\' 4"  (1.626 m)     Head Cir --      Peak Flow --      Pain Score --      Pain Loc --      Pain Edu? --      Excl. in GC? --     Constitutional: Patient is alert and answering questions appropriately. She is chronically ill appearing but nontoxic and able to answer questions and follow commands.  Eyes: Conjunctivae are normal.  EOMI. PERRLA. No evidence of swelling, erythema around the eyes and no pain with eye movements. No discharge from the eye. Head: Large amount of swelling without overlying erythema or ecchymosis which is localized to the right mandible and involving the anterior aspect of the right ear. Nose: No congestion/rhinnorhea. Mouth/Throat: Mucous membranes are dry. Patient is a dentulous without any evidence of dental abscess. When I milk her  parotid  gland there is a small amount of purulent discharge from the salivary gland on the right side of the buccal mucosa. EARS: Right TM without any erythema, bulge or fluid. Canals clear. There is no evidence of involvement of the inner ear from what I can see. The patient does not have mastoid tenderness or crepitus, no skin or color changes behind the right ear. Neck: No stridor.  Supple.   Cardiovascular: Normal rate, regular rhythm. No murmurs, rubs or gallops.  Respiratory: Normal respiratory effort.  No retractions. Lungs CTAB.  No wheezes, rales or ronchi. Gastrointestinal: Soft and nontender. No distention. No peritoneal signs. Musculoskeletal: No LE edema.  Neurologic:  Clear speech. Face is asymmetric due to swelling. Moves all extremities well.  Skin:  Skin is warm, dry. Psychiatric: Mood and affect are normal.   ____________________________________________   LABS (all labs ordered are listed, but only abnormal results are displayed)  Labs Reviewed  COMPREHENSIVE METABOLIC PANEL - Abnormal; Notable for the following:    Potassium 2.8 (*)    Glucose, Bld 114 (*)    Calcium 8.7 (*)    Albumin 2.9 (*)    AST 43 (*)    GFR calc non Af Amer 53 (*)    All other components within normal limits  CBC WITH DIFFERENTIAL/PLATELET - Abnormal; Notable for the following:    WBC 11.4 (*)    RBC 3.27 (*)    Hemoglobin 10.8 (*)    HCT 31.9 (*)    Neutro Abs 9.8 (*)    Lymphs Abs 0.4 (*)    Monocytes Absolute 1.1 (*)    All other components within normal limits  CULTURE, BLOOD (ROUTINE X 2)  CULTURE, BLOOD (ROUTINE X 2)  URINE CULTURE  WOUND CULTURE  LACTIC ACID, PLASMA  BLOOD GAS, VENOUS  LACTIC ACID, PLASMA  URINALYSIS COMPLETEWITH MICROSCOPIC ([facility] ONLY)   ____________________________________________  EKG  ED ECG REPORT I, Rockne Menghini, the attending physician, personally viewed and interpreted this ECG.   Date: 08/13/2015  EKG Time: 832  Rate: 89  Rhythm: atrial  fibrillation  Axis: Leftward  Intervals:none  ST&T Change: No ST elevation.  ____________________________________________  RADIOLOGY  Ct Maxillofacial W/cm  08/13/2015  CLINICAL DATA:  Right-sided facial pain and swelling. The swelling is new since yesterday. EXAM: CT MAXILLOFACIAL WITH CONTRAST TECHNIQUE: Multidetector CT imaging of the maxillofacial structures was performed with intravenous contrast. Multiplanar CT image reconstructions were also generated. A small metallic BB was placed on the right temple in order to reliably differentiate right from left. CONTRAST:  75mL OMNIPAQUE IOHEXOL 300 MG/ML  SOLN COMPARISON:  CT head without contrast 07/14/2014. FINDINGS: There is marked a inflammation throughout the right parotid gland without a discrete lesion. The gland is enlarged and hyperdense. Extensive subcutaneous stranding is present as well with thickening of the scan. This extends into the right temporal area and inferiorly into the right neck. The right submandibular gland is enlarged as well. There is stranding and fluid surrounding the right submandibular gland. The platysma is thickened on the right. No significant ductal dilation or stone is present in either the right submandibular duct or the parotid duct. There is edema and enlargement of the right masseter as well is the medial pterygoid. Inflammatory changes extend into the masticator space. Reactive type right level 1 and level 2 lymph nodes are noted. Mild prevertebral edema is present. There is asymmetric mucosal thickening along the right side of the oral pharynx. No discrete mass lesion is present.  Atherosclerotic calcifications are present at both carotid bifurcations without significant stenosis. Degenerative changes are present in the upper cervical spine with slight anterolisthesis at C5-6. The patient is edentulous. IMPRESSION: 1. Extensive inflammatory changes with diffuse involvement of the right parotid gland, the right  submandibular gland, and the right masticator space. No focal etiology is evident. Specifically there is no evidence for obstruction of either the right parotid or right submandibular duct. 2. Extensive subcutaneous edema and skin thickening overlying these areas as well compatible with cellulitis. No discrete abscess is evident. 3. Reactive right-sided adenopathy. Electronically Signed   By: Marin Roberts M.D.   On: 08/13/2015 10:45    ____________________________________________   PROCEDURES  Procedure(s) performed: None  Critical Care performed: Yes ____________________________________________   INITIAL IMPRESSION / ASSESSMENT AND PLAN / ED COURSE  Pertinent labs & imaging results that were available during my care of the patient were reviewed by me and considered in my medical decision making (see chart for details).  82 y.o. female nursing home patient with acute onset of severe right facial swelling associated with A. fib with RVR, and fever. On my exam I do see some purulence that can be milked from the parotid gland into Stensen's duct and I'm concerned she is having otitis with sepsis. We will get a CT scan to evaluate for rest or tinnitus. There is no evidence of meningitis at this time. She will be aggressively treated with IV fluids, as well as antipyretics and immediate antibiotics.  CRITICAL CARE Performed by: Rockne Menghini   Total critical care time: 40 minutes  Critical care time was exclusive of separately billable procedures and treating other patients.  Critical care was necessary to treat or prevent imminent or life-threatening deterioration.  Critical care was time spent personally by me on the following activities: development of treatment plan with patient and/or surrogate as well as nursing, discussions with consultants, evaluation of patient's response to treatment, examination of patient, obtaining history from patient or surrogate, ordering and  performing treatments and interventions, ordering and review of laboratory studies, ordering and review of radiographic studies, pulse oximetry and re-evaluation of patient's condition.  ----------------------------------------- 10:34 AM on 08/13/2015 -----------------------------------------  The patient's tachycardia has been responsive to fluid resuscitation as well as antipyretic and her heart rate is currently 102. I am awaiting the results of her CT face, antibiotics have been initiated.  ----------------------------------------- 11:00 AM on 08/13/2015 -----------------------------------------  The patient's CT has resulted in-show effusive inflammatory changes involving the parotid gland, submandibular gland. There is no focal abscess. I've spoken with Dr. Jenne Campus of ENT who recommends continued antibiotics, culture from the wound, and Decadron to decrease swelling. I will plan admission to the hospitalists. The patient continues to clinically improve.   ____________________________________________  FINAL CLINICAL IMPRESSION(S) / ED DIAGNOSES  Final diagnoses:  Hypokalemia  Atrial fibrillation with RVR (HCC)  Sepsis, due to unspecified organism [facility])  Acute suppurative parotitis      NEW MEDICATIONS STARTED DURING THIS VISIT:  New Prescriptions   No medications on file     Rockne Menghini, MD 08/13/15 1101

## 2015-08-13 NOTE — H&P (Signed)
[facility] [place] at [facility]   PATIENT NAME: [name]    MR#:  [id number]  DATE OF BIRTH:  Dec 17, 1928  DATE OF ADMISSION:  08/13/2015  PRIMARY CARE PHYSICIAN: Pcp Not In System   REQUESTING/REFERRING PHYSICIAN: Dr. Sharma Covert  CHIEF COMPLAINT:   Chief Complaint  Patient presents with  . Facial Swelling    HISTORY OF PRESENT ILLNESS:  [name]  is a 82 y.o. female with a known history of hypertension, chronic systolic CHF with EF of 45%, chronic atrial fibrillation presents to the emergency room with complaints of right sided neck pain and swelling. This developed over the last day. No trouble with swallowing. Patient had thrush recently according to the sister at bedside. Patient is drowsy and unable to give any history. History obtained from old records and sister at bedside. Patient had fever and tachycardia in the emergency room with heart rate in the 180s with A. fib. Improved with fluids. Patient at baseline is bed bound and in a wheelchair. Has had mild cognitive impairment since birth but is alert and oriented 3 at baseline.  PAST MEDICAL HISTORY:   Past Medical History  Diagnosis Date  . Persistent atrial fibrillation (HCC)     a. Eliquis 2.5 mg bid  . Chronic systolic CHF (congestive heart failure) (HCC)     a. echo 07/2014: EF 45-50%, dilated LA at 4.5 cm, mildly dilated RA, mod-sev MR, mod TR, mildly elevated PASP @ 45.2  . HTN (hypertension)   . Cognitive impairment   . Gait instability   . Depression   . Panic attacks   . Arrhythmia   . Arthritis   . Hyperlipidemia   . Parkinson's disease (HCC)   . Anxiety disorder     PAST SURGICAL HISTORY:   Past Surgical History  Procedure Laterality Date  . Eye surgery      SOCIAL HISTORY:   Social History  Substance Use Topics  . Smoking status: Never Smoker   . Smokeless tobacco: Not on file  . Alcohol Use: No    FAMILY HISTORY:   Family History  Problem Relation Age  of Onset  . Heart disease Other     DRUG ALLERGIES:   Allergies  Allergen Reactions  . Aliskiren-Amlodipine-Hctz Other (See Comments)  . Sulfa Antibiotics Swelling    REVIEW OF SYSTEMS:   Review of Systems  Unable to perform ROS: mental status change    MEDICATIONS AT HOME:   Prior to Admission medications   Medication Sig Start Date End Date Taking? Authorizing Provider  acetaminophen (TYLENOL) 325 MG tablet Take 650 mg by mouth every 6 (six) hours as needed for mild pain, moderate pain or fever.   Yes Historical Provider, MD  Amantadine HCl 100 MG tablet Take 100 mg by mouth 2 (two) times daily.  06/27/14  Yes Historical Provider, MD  docusate sodium (COLACE) 100 MG capsule Take 100 mg by mouth 2 (two) times daily.   Yes Historical Provider, MD  ELIQUIS 2.5 MG TABS tablet Take 2.5 mg by mouth 2 (two) times daily.  07/18/14  Yes Historical Provider, MD  fexofenadine (ALLEGRA) 180 MG tablet Take 180 mg by mouth daily.   Yes Historical Provider, MD  furosemide (LASIX) 20 MG tablet Take 20 mg by mouth 2 (two) times daily.  07/25/14  Yes Historical Provider, MD  guaiFENesin (ROBITUSSIN) 100 MG/5ML liquid Take 200 mg by mouth every 6 (six) hours as needed for cough.   Yes Historical Provider,  MD  ipratropium-albuterol (DUONEB) 0.5-2.5 (3) MG/3ML SOLN Take 3 mLs by nebulization every 6 (six) hours as needed (for shortness of breath/wheezing.).    Yes Historical Provider, MD  levothyroxine (SYNTHROID, LEVOTHROID) 50 MCG tablet Take 50 mcg by mouth daily after breakfast.  07/18/14  Yes Historical Provider, MD  magnesium hydroxide (MILK OF MAGNESIA) 400 MG/5ML suspension Take 5 mLs by mouth 4 (four) times daily as needed for mild constipation or moderate constipation.   Yes Historical Provider, MD  metoprolol (LOPRESSOR) 50 MG tablet Take 50 mg by mouth 2 (two) times daily.  07/18/14  Yes Historical Provider, MD  montelukast (SINGULAIR) 10 MG tablet Take 10 mg by mouth at bedtime.    Yes  Historical Provider, MD  Multiple Vitamin (MULTIVITAMIN) tablet Take 1 tablet by mouth daily.   Yes Historical Provider, MD  PARoxetine (PAXIL) 20 MG tablet Take 20 mg by mouth every morning.    Yes Historical Provider, MD  potassium chloride SA (K-DUR,KLOR-CON) 20 MEQ tablet Take 20 mEq by mouth daily.  07/15/14  Yes Historical Provider, MD  chlorpheniramine-HYDROcodone (TUSSIONEX PENNKINETIC ER) 10-8 MG/5ML SUER Take 5 mLs by mouth 2 (two) times daily. Patient not taking: Reported on 06/26/2015 06/08/15   Irean Hong, MD  chlorpheniramine-HYDROcodone [facility] ER) 10-8 MG/5ML SUER Take 5 mLs by mouth 2 (two) times daily. 06/26/15   Rebecka Apley, MD  levofloxacin (LEVAQUIN) 750 MG tablet Take 1 tablet (750 mg total) by mouth daily. Patient not taking: Reported on 06/26/2015 06/08/15   Irean Hong, MD  predniSONE (DELTASONE) 20 MG tablet Take 3 tablets (60 mg total) by mouth daily. 06/26/15   Rebecka Apley, MD      VITAL SIGNS:  Blood pressure 123/64, pulse 98, temperature 100.7 F (38.2 C), temperature source Oral, resp. rate 17, height 5\' 4"  (1.626 m), weight 71 kg (156 lb 8.4 oz), SpO2 98 %.  PHYSICAL EXAMINATION:  Physical Exam  GENERAL:  82 y.o.-year-old patient lying in the bed with no acute distress. Obese EYES: Pupils equal, round, reactive to light and accommodation. No scleral icterus. Extraocular muscles intact.  HEENT: Head atraumatic, normocephalic. Oropharynx and nasopharynx clear. No oropharyngeal erythema, moist oral mucosa  NECK:  Supple. Right-sided swelling and induration over right lower jaw and upper part of the neck around the ear. No open ulcers or discharge. Has purulent discharge in the oral cavity on the right side. LUNGS: Normal breath sounds bilaterally, no wheezing, rales, rhonchi. No use of accessory muscles of respiration.  CARDIOVASCULAR: S1, S2 . No murmurs, rubs, or gallops. Irregular ABDOMEN: Soft, nontender, nondistended. Bowel sounds  present. No organomegaly or mass.  EXTREMITIES: No pedal edema, cyanosis, or clubbing. + 2 pedal & radial pulses b/l.   NEUROLOGIC: Cranial nerves II through XII are intact. No focal Motor or sensory deficits appreciated b/l PSYCHIATRIC: The patient is drowsy. He wakes up on calling her name. SKIN: No obvious rash, lesion, or ulcer.   LABORATORY PANEL:   CBC  Recent Labs Lab 08/13/15 0845  WBC 11.4*  HGB 10.8*  HCT 31.9*  PLT 244   ------------------------------------------------------------------------------------------------------------------  Chemistries   Recent Labs Lab 08/13/15 0845  NA 141  K 2.8*  CL 101  CO2 30  GLUCOSE 114*  BUN 17  CREATININE 0.95  CALCIUM 8.7*  AST 43*  ALT 36  ALKPHOS 101  BILITOT 1.0   ------------------------------------------------------------------------------------------------------------------  Cardiac Enzymes No results for input(s): TROPONINI in the last 168 hours. ------------------------------------------------------------------------------------------------------------------  RADIOLOGY:  Ct Maxillofacial W/cm  08/13/2015  CLINICAL DATA:  Right-sided facial pain and swelling. The swelling is new since yesterday. EXAM: CT MAXILLOFACIAL WITH CONTRAST TECHNIQUE: Multidetector CT imaging of the maxillofacial structures was performed with intravenous contrast. Multiplanar CT image reconstructions were also generated. A small metallic BB was placed on the right temple in order to reliably differentiate right from left. CONTRAST:  75mL OMNIPAQUE IOHEXOL 300 MG/ML  SOLN COMPARISON:  CT head without contrast 07/14/2014. FINDINGS: There is marked a inflammation throughout the right parotid gland without a discrete lesion. The gland is enlarged and hyperdense. Extensive subcutaneous stranding is present as well with thickening of the scan. This extends into the right temporal area and inferiorly into the right neck. The right submandibular  gland is enlarged as well. There is stranding and fluid surrounding the right submandibular gland. The platysma is thickened on the right. No significant ductal dilation or stone is present in either the right submandibular duct or the parotid duct. There is edema and enlargement of the right masseter as well is the medial pterygoid. Inflammatory changes extend into the masticator space. Reactive type right level 1 and level 2 lymph nodes are noted. Mild prevertebral edema is present. There is asymmetric mucosal thickening along the right side of the oral pharynx. No discrete mass lesion is present. Atherosclerotic calcifications are present at both carotid bifurcations without significant stenosis. Degenerative changes are present in the upper cervical spine with slight anterolisthesis at C5-6. The patient is edentulous. IMPRESSION: 1. Extensive inflammatory changes with diffuse involvement of the right parotid gland, the right submandibular gland, and the right masticator space. No focal etiology is evident. Specifically there is no evidence for obstruction of either the right parotid or right submandibular duct. 2. Extensive subcutaneous edema and skin thickening overlying these areas as well compatible with cellulitis. No discrete abscess is evident. 3. Reactive right-sided adenopathy. Electronically Signed   By: Marin Roberts M.D.   On: 08/13/2015 10:45     IMPRESSION AND PLAN:   * Right parotid and submandibular gland infection with sepsis and acute encephalopathy Patient will be started on vancomycin and Zosyn. IV fluids. Lactic acid. Blood cultures sent. Case discussed with ENT from emergency room and advised steroids and antibiotics. No abscess on CAT scan. Consult ENT  * Atrial fibrillation with rapid ventricular rate Patient has history of A. fib. Worsening due to sepsis. Improved with IV fluids.  * Chronic systolic CHF Presenting sepsis and needs fluids. Monitor for any fluid  overload.  * Hypertension Home medications due to sepsis. We'll restart medications once blood pressure improved.  * DVT prophylaxis. Patient is on Eliquis for A. fib.   All the records are reviewed and case discussed with ED provider. Management plans discussed with the patient, family and they are in agreement.  CODE STATUS: DNR  TOTAL TIME TAKING CARE OF THIS PATIENT: 45 minutes.    Milagros Loll R M.D on 08/13/2015 at 12:29 PM  Between 7am to 6pm - Pager - [phone]  After 6pm go to [email or web address] - password [email or web address]  [facility]  Office  [phone]  CC: Primary care physician; Pcp Not In System     Note: This dictation was prepared with Dragon dictation along with smaller phrase technology. Any transcriptional errors that result from this process are unintentional.

## 2015-08-13 NOTE — Progress Notes (Signed)
Paged by Dr. Jenne Campus. Discussed with him. He advised no procedures are needed from ENT at this point and consult if any worsening.  Cancelled consult.

## 2015-08-13 NOTE — ED Notes (Signed)
Pt transported to ct scan

## 2015-08-14 ENCOUNTER — Encounter: Payer: Self-pay | Admitting: Physician Assistant

## 2015-08-14 ENCOUNTER — Inpatient Hospital Stay: Payer: Medicare Other

## 2015-08-14 DIAGNOSIS — I4891 Unspecified atrial fibrillation: Secondary | ICD-10-CM

## 2015-08-14 LAB — URINE CULTURE: CULTURE: NO GROWTH

## 2015-08-14 LAB — CBC
HCT: 30.3 % — ABNORMAL LOW (ref 35.0–47.0)
Hemoglobin: 10.1 g/dL — ABNORMAL LOW (ref 12.0–16.0)
MCH: 32.3 pg (ref 26.0–34.0)
MCHC: 33.2 g/dL (ref 32.0–36.0)
MCV: 97.4 fL (ref 80.0–100.0)
PLATELETS: 214 10*3/uL (ref 150–440)
RBC: 3.12 MIL/uL — AB (ref 3.80–5.20)
RDW: 13.6 % (ref 11.5–14.5)
WBC: 11.8 10*3/uL — ABNORMAL HIGH (ref 3.6–11.0)

## 2015-08-14 LAB — BASIC METABOLIC PANEL
Anion gap: 6 (ref 5–15)
BUN: 16 mg/dL (ref 6–20)
CALCIUM: 8.3 mg/dL — AB (ref 8.9–10.3)
CO2: 23 mmol/L (ref 22–32)
CREATININE: 0.69 mg/dL (ref 0.44–1.00)
Chloride: 113 mmol/L — ABNORMAL HIGH (ref 101–111)
GFR calc non Af Amer: >60 mL/min (ref >60)
GLUCOSE: 106 mg/dL — AB (ref 65–99)
Potassium: 4.5 mmol/L (ref 3.5–5.1)
Sodium: 142 mmol/L (ref 135–145)

## 2015-08-14 MED ORDER — DILTIAZEM HCL 25 MG/5ML IV SOLN
10.0000 mg | Freq: Once | INTRAVENOUS | Status: AC
  Administered 2015-08-14: 10 mg via INTRAVENOUS
  Filled 2015-08-14: qty 5

## 2015-08-14 MED ORDER — NYSTATIN 100000 UNIT/ML MT SUSP
5.0000 mL | Freq: Four times a day (QID) | OROMUCOSAL | Status: DC
  Administered 2015-08-14 – 2015-08-16 (×8): 500000 [IU] via ORAL
  Filled 2015-08-14 (×12): qty 5

## 2015-08-14 MED ORDER — APIXABAN 5 MG PO TABS
5.0000 mg | ORAL_TABLET | Freq: Two times a day (BID) | ORAL | Status: DC
  Administered 2015-08-14 – 2015-08-16 (×4): 5 mg via ORAL
  Filled 2015-08-14 (×4): qty 1

## 2015-08-14 MED ORDER — METOPROLOL TARTRATE 25 MG PO TABS
25.0000 mg | ORAL_TABLET | Freq: Four times a day (QID) | ORAL | Status: DC
  Administered 2015-08-14 – 2015-08-16 (×8): 25 mg via ORAL
  Filled 2015-08-14 (×8): qty 1

## 2015-08-14 MED ORDER — CETYLPYRIDINIUM CHLORIDE 0.05 % MT LIQD
7.0000 mL | Freq: Two times a day (BID) | OROMUCOSAL | Status: DC
  Administered 2015-08-14 – 2015-08-16 (×4): 7 mL via OROMUCOSAL

## 2015-08-14 MED ORDER — FUROSEMIDE 10 MG/ML IJ SOLN
40.0000 mg | Freq: Once | INTRAMUSCULAR | Status: AC
  Administered 2015-08-14: 40 mg via INTRAVENOUS
  Filled 2015-08-14: qty 4

## 2015-08-14 MED ORDER — DEXAMETHASONE SODIUM PHOSPHATE 10 MG/ML IJ SOLN
10.0000 mg | Freq: Two times a day (BID) | INTRAMUSCULAR | Status: DC
  Administered 2015-08-14 – 2015-08-15 (×2): 10 mg via INTRAVENOUS
  Filled 2015-08-14 (×2): qty 1

## 2015-08-14 NOTE — Clinical Social Work Note (Signed)
Clinical Social Work Assessment  Patient Details  Name: [name] MRN: [id number] Date of Birth: Nov 14, 1928  Date of referral:  08/14/15               Reason for consult:  Discharge Planning                Permission sought to share information with:  Family Supports Permission granted to share information::  Yes, Verbal Permission Granted  Name::        Agency::     Relationship::   Norris Cross [phone])  Contact Information:     Housing/Transportation Living arrangements for the past 2 months:  Assisted Living Facility [facility] ) Source of Information:  Patient, Other (Comment Required) Norris Cross [phone]) Patient Interpreter Needed:  None Criminal Activity/Legal Involvement Pertinent to Current Situation/Hospitalization:  No - Comment as needed Significant Relationships:  Siblings Norris Cross [phone]) Lives with:  Facility Resident ([facility] ) Do you feel safe going back to the place where you live?  Yes Need for family participation in patient care:  Yes (Comment) Norris Cross [phone])  Care giving concerns:  Patient is from [facility]    Social Worker assessment / plan:  CSW was consulted due to patient being a resident at [facility]. CSW spoke to patient and her sister Norris Cross at bedside. Patient confirmed that she is a resident at [facility]. Her sister reports taht she's been at [facility] since Feb. 1, 2017. Patient reports that she'd like to return to [place] at discharge. She sated that she uses a wheelchair and uses assistance with eating, dressing and bathing. Reported that her family could possibly transport her at discharge but family is open to EMS if needed. Verbal permission to contact [facility].   CSW spoke to [employer] at [place]. She reports that patient can return at discharge. She sated that patient needs assistance with dressing, eating and bathing. Per Toni Amend she will come visit patient today at  [facility].   FL2/ PASRR completed and faxed to [facility]. CSW will continue to follow and assist.   Employment status:  Retired Health and safety inspector:  Medicare, Medicaid In [place] PT Recommendations:  Not assessed at this time Information / Referral to community resources:     Patient/Family's Response to care:  Patient and her family are in agreement that patient will return to [place] at discharge.   Patient/Family's Understanding of and Emotional Response to Diagnosis, Current Treatment, and Prognosis:  Patient and her family understands CSW's role and are appreciative to CSW's assistance.   Emotional Assessment Appearance:  Appears stated age Attitude/Demeanor/Rapport:   (None ) Affect (typically observed):  Calm, Pleasant, Accepting Orientation:  Oriented to Self, Oriented to Place, Oriented to Situation Alcohol / Substance use:  Not Applicable Psych involvement (Current and /or in the community):  No (Comment)  Discharge Needs  Concerns to be addressed:  Discharge Planning Concerns Readmission within the last 30 days:  No Current discharge risk:  Chronically ill Barriers to Discharge:  Continued Medical Work up   [place], LCSW 08/14/2015, 2:48 PM

## 2015-08-14 NOTE — Evaluation (Signed)
Clinical/Bedside Swallow Evaluation Patient Details  Name: [name] MRN: [id number] Date of Birth: 1928-11-22  Today's Date: 08/14/2015 Time: SLP Start Time (ACUTE ONLY): 0900 SLP Stop Time (ACUTE ONLY): 1000 SLP Time Calculation (min) (ACUTE ONLY): 60 min  Past Medical History:  Past Medical History  Diagnosis Date  . Persistent atrial fibrillation (HCC)     a. Eliquis 2.5 mg bid  . Chronic systolic CHF (congestive heart failure) (HCC)     a. echo 07/2014: EF 45-50%, dilated LA at 4.5 cm, mildly dilated RA, mod-sev MR, mod TR, mildly elevated PASP @ 45.2  . HTN (hypertension)   . Cognitive impairment   . Gait instability   . Depression   . Panic attacks   . Arrhythmia   . Arthritis   . Hyperlipidemia   . Parkinson's disease (HCC)   . Anxiety disorder    Past Surgical History:  Past Surgical History  Procedure Laterality Date  . Eye surgery     HPI:  Pt is a 82 y.o. female with a known history of hypertension, chronic systolic CHF with EF of 45%, chronic atrial fibrillation presents to the emergency room with complaints of right sided neck pain and swelling. This developed over the last day. No trouble with swallowing. Patient had thrush recently according to the sister at bedside. Patient is drowsy and unable to give any history. History obtained from old records and sister at bedside. Pt is currently awake, alert and able to answer basic questions and follow general instruction. Pt had finished her breakfast meal and was drinking her coffee w/ her Sister. Noted a slight wheeze in her breathing after multiple sips of coffee but no change in vocal quality. Pt's Sister has c/o a mild cough that occurs after eating meals as well. Pt does not have the dx of reflux or is on a PPI per Sister. Pt had recent thrush w/ tx per Sister.   Assessment / Plan / Recommendation Clinical Impression  Pt appeared to adequately tolerate trials of thin liquids and purees/soft solids (w/ Sister as  well) w/ no immediate, overt s/s of aspiration noted; though pt did exhibit an intermittent wheeze in expiration x2 during trials. This respiratory presentation was mild and resolved w/ a rest break b/t po's. There was no change in vocal quality and no immediate coughing/throat clearing. Oral phase appeared adequate to manage the soft/pureed foods given by Sister. After ~5 mins, pt exhibited a slight cough x1 which pt has demo. after meals and snacks at NH, esp. after drinking a carbonated drink per Sister. Pt requires some feeding assistance d/t UE tremors and overall weakness. Sister is concerned about pt's recent experience w/ Thrush and this new submandibular mass/ear ache. Due to pt's overall presentation, an objective swallowing assessment is rec'd; rec. modified diet to Dys. 3 w/ thin liquids(ground meats); meds Whole in puree for precaution and ease of swallowing; aspiration precautions; assistance feeding at meals. Rec. consideration of addressing potential med for Thrush(preventative) d/t pt being back on antibiotics again. Rec. for GI f/u for assessment sec. to s/s of Reflux w/ meals(fullness feelings, descritpion of "reflux cough", vocal quality change). ST will f/u w/ MD. NSG updated.     Aspiration Risk   (redcued following aspiration precautions)    Diet Recommendation  Dys. 3 w/ thin liquids(ground meats w/ gravy added to moisten); aspiration and Reflux precautions; feeding assistance at meals.   Medication Administration: Whole meds with puree    Other  Recommendations Recommended Consults: Consider  GI evaluation (Dietician) Oral Care Recommendations: Oral care BID;Staff/trained caregiver to provide oral care   Follow up Recommendations   (TBD)    Frequency and Duration min 3x week  2 weeks       Prognosis Prognosis for Safe Diet Advancement: Fair (-good) Barriers to Reach Goals: Cognitive deficits      Swallow Study   General Date of Onset: 08/13/15 HPI: Pt is a 82 y.o.  female with a known history of hypertension, chronic systolic CHF with EF of 45%, chronic atrial fibrillation presents to the emergency room with complaints of right sided neck pain and swelling. This developed over the last day. No trouble with swallowing. Patient had thrush recently according to the sister at bedside. Patient is drowsy and unable to give any history. History obtained from old records and sister at bedside. Pt is currently awake, alert and able to answer basic questions and follow general instruction. Pt had finished her breakfast meal and was drinking her coffee w/ her Sister. Noted a slight wheeze in her breathing after multiple sips of coffee but no change in vocal quality. Pt's Sister has c/o a mild cough that occurs after eating meals as well. Pt does not have the dx of reflux or is on a PPI per Sister. Pt had recent thrush w/ tx per Sister. Type of Study: Bedside Swallow Evaluation Previous Swallow Assessment: at NH - "passed" per Sister Diet Prior to this Study: Regular;Thin liquids ("but soft") Temperature Spikes Noted: Yes (100.7;  wbc 11.8) Respiratory Status: Room air History of Recent Intubation: No Behavior/Cognition: Alert;Cooperative;Pleasant mood (baseline Cognitive decline/MR per Sister) Oral Cavity Assessment: Within Functional Limits Oral Care Completed by SLP: No (eating) Oral Cavity - Dentition: Edentulous Vision: Functional for self-feeding (but tremorous and does not always feed self) Self-Feeding Abilities: Needs assist;Needs set up Patient Positioning: Upright in bed Baseline Vocal Quality:  (slightly gravely quality) Volitional Cough: Strong Volitional Swallow: Able to elicit    Oral/Motor/Sensory Function Overall Oral Motor/Sensory Function: Within functional limits   Ice Chips Ice chips: Not tested   Thin Liquid Thin Liquid: Within functional limits Presentation: Cup;Straw Other Comments: ~2-3 ozs total    Nectar Thick Nectar Thick Liquid: Not  tested   Honey Thick Honey Thick Liquid: Not tested   Puree Puree: Within functional limits Presentation: Spoon (fed; 4-5 trials (all of her Oatmeal and applesauce))   Solid   GO   Solid: Not tested Other Comments: none on tray; had finished eating peaches w/ muffin w/ Sister - no deficits reported by Sister (appeared baseline per her)        Jerilynn Som, MS, CCC-SLP  Watson,Katherine 08/14/2015,11:05 AM

## 2015-08-14 NOTE — Care Management (Signed)
SLP to perform modified barium swallow today.  Patient is back on antibiotics for her abscess.  Discussing with SLP that with recent thrush and now back on antibiotics, patient may benefit from swish and swallow Nystatin as preventative measure for thrush.  Attending agreed and will order.

## 2015-08-14 NOTE — Progress Notes (Signed)
[facility] [place] at [facility]   PATIENT NAME: [name]    MR#:  [id number]  DATE OF BIRTH:  19-Dec-1928  SUBJECTIVE:   Patient continues to have pain in the right side of her neck. History of bedside reports the patient has had several episodes of coughing for the past several weeks after she eats.  REVIEW OF SYSTEMS:    Review of Systems  Constitutional: Negative for fever, chills and malaise/fatigue.  HENT: Negative for sore throat.        Right neck pain  Eyes: Negative for blurred vision.  Respiratory: Positive for cough. Negative for hemoptysis, shortness of breath and wheezing.   Cardiovascular: Negative for chest pain, palpitations and leg swelling.  Gastrointestinal: Negative for nausea, vomiting, abdominal pain, diarrhea and blood in stool.  Genitourinary: Negative for dysuria.  Musculoskeletal: Negative for back pain.  Neurological: Negative for dizziness, tremors and headaches.  Endo/Heme/Allergies: Does not bruise/bleed easily.    Tolerating Diet:yes      DRUG ALLERGIES:   Allergies  Allergen Reactions  . Aliskiren-Amlodipine-Hctz Other (See Comments)  . Sulfa Antibiotics Swelling    VITALS:  Blood pressure 120/64, pulse 144, temperature 97.7 F (36.5 C), temperature source Oral, resp. rate 23, height  (1.626 m), weight 70.262 kg (154 lb 14.4 oz), SpO2 100 %.  PHYSICAL EXAMINATION:   Physical Exam  Constitutional: She is well-developed, well-nourished, and in no distress. No distress.  HENT:  Head: Normocephalic.  Right neck hot and tender to touch submandibular and parotid gland sight  Eyes: No scleral icterus.  Neck: Normal range of motion. Neck supple. No JVD present. No tracheal deviation present.  Cardiovascular: Normal rate and normal heart sounds.  Exam reveals no gallop and no friction rub.   No murmur heard. Irr, irr  Pulmonary/Chest: Effort normal and breath sounds normal. No respiratory distress.  She has no wheezes. She has no rales. She exhibits no tenderness.  Abdominal: Soft. Bowel sounds are normal. She exhibits no distension and no mass. There is no tenderness. There is no rebound and no guarding.  Musculoskeletal: Normal range of motion. She exhibits no edema.  Neurological: She is alert.  Skin: Skin is warm. No rash noted. No erythema.  Psychiatric: Affect and judgment normal.      LABORATORY PANEL:   CBC  Recent Labs Lab 08/14/15 0500  WBC 11.8*  HGB 10.1*  HCT 30.3*  PLT 214   ------------------------------------------------------------------------------------------------------------------  Chemistries   Recent Labs Lab 08/13/15 0845 08/13/15 1742 08/14/15 0500  NA 141  --  142  K 2.8* 3.5 4.5  CL 101  --  113*  CO2 30  --  23  GLUCOSE 114*  --  106*  BUN 17  --  16  CREATININE 0.95  --  0.69  CALCIUM 8.7*  --  8.3*  MG  --  1.8  --   AST 43*  --   --   ALT 36  --   --   ALKPHOS 101  --   --   BILITOT 1.0  --   --    ------------------------------------------------------------------------------------------------------------------  Cardiac Enzymes No results for input(s): TROPONINI in the last 168 hours. ------------------------------------------------------------------------------------------------------------------  RADIOLOGY:  Ct Maxillofacial W/cm  08/13/2015  CLINICAL DATA:  Right-sided facial pain and swelling. The swelling is new since yesterday. EXAM: CT MAXILLOFACIAL WITH CONTRAST TECHNIQUE: Multidetector CT imaging of the maxillofacial structures was performed with intravenous contrast. Multiplanar CT image reconstructions were also generated. A  small metallic BB was placed on the right temple in order to reliably differentiate right from left. CONTRAST:  75mL OMNIPAQUE IOHEXOL 300 MG/ML  SOLN COMPARISON:  CT head without contrast 07/14/2014. FINDINGS: There is marked a inflammation throughout the right parotid gland without a discrete  lesion. The gland is enlarged and hyperdense. Extensive subcutaneous stranding is present as well with thickening of the scan. This extends into the right temporal area and inferiorly into the right neck. The right submandibular gland is enlarged as well. There is stranding and fluid surrounding the right submandibular gland. The platysma is thickened on the right. No significant ductal dilation or stone is present in either the right submandibular duct or the parotid duct. There is edema and enlargement of the right masseter as well is the medial pterygoid. Inflammatory changes extend into the masticator space. Reactive type right level 1 and level 2 lymph nodes are noted. Mild prevertebral edema is present. There is asymmetric mucosal thickening along the right side of the oral pharynx. No discrete mass lesion is present. Atherosclerotic calcifications are present at both carotid bifurcations without significant stenosis. Degenerative changes are present in the upper cervical spine with slight anterolisthesis at C5-6. The patient is edentulous. IMPRESSION: 1. Extensive inflammatory changes with diffuse involvement of the right parotid gland, the right submandibular gland, and the right masticator space. No focal etiology is evident. Specifically there is no evidence for obstruction of either the right parotid or right submandibular duct. 2. Extensive subcutaneous edema and skin thickening overlying these areas as well compatible with cellulitis. No discrete abscess is evident. 3. Reactive right-sided adenopathy. Electronically Signed   By: Marin Roberts M.D.   On: 08/13/2015 10:45     ASSESSMENT AND PLAN:   82 year old female with history of persistent atrial fibrillation, chronic systolic heart failure EF of 45-50% and essential hypertension who presented with right sided neck pain and found to have Extensive inflammatory changes with diffuse involvement of the right parotid gland, the right  submandibular gland, and the right masticator space.  1. Right parotid gland and some mandibular gland infection: Continue vancomycin and Zosyn. Case discussed with ENT physician who advised steroids and antibiotics. No abscess seen on CT scan. If needed will consult ENT physician. We'll order speech evaluation for cough. Wean Decadron.   2. History of atrial fibrillation: Rate is better controlled. Continue Elocon this dose 5 Mill grams by mouth twice a day and rate control with metoprolol.  3. Hypothyroid: Continue Synthroid  4. Hypokalemia: Potassium is improved.     Management plans discussed with the patient's sister and she is in agreement.  CODE STATUS: DO NOT RESUSCITATE  TOTAL TIME TAKING CARE OF THIS PATIENT: 30 minutes.     POSSIBLE D/C 1-2 days, DEPENDING ON CLINICAL CONDITION.   Hezekiah Veltre M.D on 08/14/2015 at 11:14 AM  Between 7am to 6pm - Pager - [phone] After 6pm go to [email or web address] - password [email or web address]  [facility]  Office  [phone]  CC: Primary care physician; Pcp Not In System  Note: This dictation was prepared with Dragon dictation along with smaller phrase technology. Any transcriptional errors that result from this process are unintentional.

## 2015-08-14 NOTE — NC FL2 (Signed)
[place] MEDICAID FL2 LEVEL OF CARE SCREENING TOOL     IDENTIFICATION  Patient Name: [name] Birthdate: 1928-12-10 Sex: female Admission Date (Current Location): 08/13/2015  [facility] and [place] Number:  [address]  ([id number]) Facility and Address:  [facility], [address] [place], [place] [address]      Provider Number: [id number]  Attending Physician Name and Address:  Adrian Saran, MD  Relative Name and Phone Number:       Current Level of Care: Hospital Recommended Level of Care: Assisted Living Facility  Prior Approval Number:    Date Approved/Denied:   PASRR Number:  [id number])  Discharge Plan: ALF   Current Diagnoses: Patient Active Problem List   Diagnosis Date Noted  . Submandibular gland infection 08/13/2015  . Bilateral leg edema 10/24/2014  . Chronic atrial fibrillation (HCC) 08/23/2014  . Chronic systolic CHF (congestive heart failure) (HCC)   . Persistent atrial fibrillation (HCC)   . HTN (hypertension)   . Cognitive impairment   . Gait instability   . Depression   . Panic attacks     Orientation RESPIRATION BLADDER Height & Weight     Self, Time, Place  Normal Incontinent Weight: 154 lb 14.4 oz (70.262 kg) Height:   (162.6 cm)  BEHAVIORAL SYMPTOMS/MOOD NEUROLOGICAL BOWEL NUTRITION STATUS   (None )  (None ) Incontinent Diet (Heart )  AMBULATORY STATUS COMMUNICATION OF NEEDS Skin   Limited Assist Verbally Normal                       Personal Care Assistance Level of Assistance  Bathing, Feeding, Dressing Bathing Assistance: Limited assistance Feeding assistance: Independent Dressing Assistance: Limited assistance     Functional Limitations Info  Sight, Hearing, Speech Sight Info: Impaired (Patient has impaired vision in both ears) Hearing Info: Impaired (Patient has impaired vision in both eyes. ) Speech Info: Adequate    SPECIAL CARE FACTORS FREQUENCY    Contact precautions   MRSA                   Contractures      Additional Factors Info  Code Status, Allergies Code Status Info:  (DNR) Allergies Info:  (Aliskiren-amlodipine-hctz, Sulfa Antibiotics)             Current Discharge Medication List    START taking these medications   Details  clindamycin (CLEOCIN) 300 MG capsule Take 1 capsule (300 mg total) by mouth 3 (three) times daily. Qty: 30 capsule, Refills: 0    mupirocin ointment (BACTROBAN) 2 % Place 1 application into the nose 2 (two) times daily. Qty: 22 g, Refills: 0    nystatin (MYCOSTATIN) 100000 UNIT/ML suspension Take 5 mLs (500,000 Units total) by mouth 4 (four) times daily. Qty: 60 mL, Refills: 0    predniSONE (STERAPRED UNI-PAK 21 TAB) 10 MG (21) TBPK tablet Take 1 tablet (10 mg total) by mouth daily. Qty: 21 tablet, Refills: 0      CONTINUE these medications which have CHANGED   Details  apixaban (ELIQUIS) 5 MG TABS tablet Take 1 tablet (5 mg total) by mouth 2 (two) times daily. Qty: 60 tablet, Refills: 0      CONTINUE these medications which have NOT CHANGED   Details  acetaminophen (TYLENOL) 325 MG tablet Take 650 mg by mouth every 6 (six) hours as needed for mild pain, moderate pain or fever.    Amantadine HCl 100 MG tablet Take 100 mg by mouth 2 (two)  times daily.     docusate sodium (COLACE) 100 MG capsule Take 100 mg by mouth 2 (two) times daily.    fexofenadine (ALLEGRA) 180 MG tablet Take 180 mg by mouth daily.    furosemide (LASIX) 20 MG tablet Take 20 mg by mouth 2 (two) times daily.     guaiFENesin (ROBITUSSIN) 100 MG/5ML liquid Take 200 mg by mouth every 6 (six) hours as needed for cough.    ipratropium-albuterol (DUONEB) 0.5-2.5 (3) MG/3ML SOLN Take 3 mLs by nebulization every 6 (six) hours as needed (for shortness of breath/wheezing.).     levothyroxine (SYNTHROID, LEVOTHROID) 50 MCG tablet Take 50 mcg by mouth daily after breakfast.     magnesium  hydroxide (MILK OF MAGNESIA) 400 MG/5ML suspension Take 5 mLs by mouth 4 (four) times daily as needed for mild constipation or moderate constipation.    metoprolol (LOPRESSOR) 50 MG tablet Take 50 mg by mouth 2 (two) times daily.     montelukast (SINGULAIR) 10 MG tablet Take 10 mg by mouth at bedtime.     Multiple Vitamin (MULTIVITAMIN) tablet Take 1 tablet by mouth daily.    PARoxetine (PAXIL) 20 MG tablet Take 20 mg by mouth every morning.     potassium chloride SA (K-DUR,KLOR-CON) 20 MEQ tablet Take 20 mEq by mouth daily.       STOP taking these medications     chlorpheniramine-HYDROcodone (TUSSIONEX PENNKINETIC ER) 10-8 MG/5ML SUER      chlorpheniramine-HYDROcodone (TUSSIONEX PENNKINETIC ER) 10-8 MG/5ML SUER      levofloxacin (LEVAQUIN) 750 MG tablet      predniSONE (DELTASONE) 20 MG tablet              Relevant Imaging Results:  Relevant Lab Results:   Additional Information  (SSN [id number])  Verta Ellen Thresia Ramanathan, LCSW

## 2015-08-14 NOTE — Evaluation (Signed)
Objective Swallowing Evaluation: Type of Study: MBS-Modified Barium Swallow Study  Patient Details  Name: [name] MRN: [id number] Date of Birth: [birth year]  Today's Date: 08/14/2015 Time: SLP Start Time (ACUTE ONLY): 1400-SLP Stop Time (ACUTE ONLY): 1500 SLP Time Calculation (min) (ACUTE ONLY): 60 min  Past Medical History:  Past Medical History  Diagnosis Date  . Persistent atrial fibrillation (HCC)     a. Eliquis 2.5 mg bid  . Chronic systolic CHF (congestive heart failure) (HCC)     a. echo 07/2014: EF 45-50%, dilated LA at 4.5 cm, mildly dilated RA, mod-sev MR, mod TR, mildly elevated PASP @ 45.2  . HTN (hypertension)   . Cognitive impairment   . Gait instability   . Depression   . Panic attacks   . Arthritis   . Hyperlipidemia   . Parkinson's disease (HCC)   . Anxiety disorder    Past Surgical History:  Past Surgical History  Procedure Laterality Date  . Eye surgery     HPI: Pt is a 82 y.o. female with a known history of hypertension, chronic systolic CHF with EF of 45%, chronic atrial fibrillation presents to the emergency room with complaints of right sided neck pain and swelling. This developed over the last day. No trouble with swallowing. Patient had thrush recently according to the sister at bedside. Patient is drowsy and unable to give any history. History obtained from old records and sister at bedside. Pt is currently awake, alert and able to answer basic questions and follow general instruction. Pt had finished her breakfast meal and was drinking her coffee w/ her Sister. Noted a slight wheeze in her breathing after multiple sips of coffee but no change in vocal quality. Pt's Sister has noted a mild cough that usually occurs after eating meals as well. Pt does not have the dx of reflux or is on a PPI per Sister. Pt had recent thrush w/ tx per Sister.  Subjective: pt awake, sitting in MBSS chair. talkative often asking how she swallowed. Noted pt having the mild  expiratory wheeze upon being transferred from the bed to the chair; this calmed as pt was given a rest break prior to po trials.  Assessment / Plan / Recommendation  CHL IP CLINICAL IMPRESSIONS 08/14/2015  Therapy Diagnosis Mild oral phase dysphagia  Clinical Impression Pt exhibited mild-moderate oropharyngeal phase dysphagia c/b delayed pharyngeal swallow initiation moreso w/ liquids and decreased sensation of residue from the oral cavity spilling to, and filling, the valleculae and pyriform sinuses b/f pt followed w/ a dry swallow involuntarily. Verbal and tactile cues were given to encourage pt to use a quicker f/u, dry voluntary swallow to clear this pharyngeal residue, however, pt did not immediately respond to the cueing - suspect d/t baseline Cognitive/MR status. No Aspiration and no laryngeal penetration of liquids or foods occured during this study w/ the po trials given, however, pt is at increased risk for such d/t the delayed pharyngeal swallow initiation. During the oral phase, pt was unable to gum/mash the softened graham cracker(w/ applesauce) and had to expectorate a (small) piece - pt stated "I cannot chew it". No change in respiratory status noted w/ the exertion of the po trials during the study. No significant Esophageal phase deficits or dysmotility noted; boluses given(alternated too) appeared to clear the Esophagus adequately. Due to pt's increased risk for aspiration from oropharyngeal phase dysphagia(suspect could be baseline status) as well as her baseline Cognitive status, rec. a modified diet of Dys. 2 w/ thin  liquids w/ strict aspiration precautions; general reflux precautions; meds in puree. Monitoring at all meals.   Impact on safety and function Mild aspiration risk      CHL IP TREATMENT RECOMMENDATION 08/14/2015  Treatment Recommendations Therapy as outlined in treatment plan below     Prognosis 08/14/2015  Prognosis for Safe Diet Advancement Fair  Barriers to Reach Goals  Cognitive deficits  Barriers/Prognosis Comment --    CHL IP DIET RECOMMENDATION 08/14/2015  SLP Diet Recommendations Dysphagia 2 (Fine chop) solids;Thin liquid  Liquid Administration via Cup  Medication Administration Crushed with puree  Compensations Minimize environmental distractions;Slow rate;Small sips/bites;Lingual sweep for clearance of pocketing;Multiple dry swallows after each bite/sip;Follow solids with liquid  Postural Changes Seated upright at 90 degrees;Remain semi-upright after after feeds/meals (Comment)      CHL IP OTHER RECOMMENDATIONS 08/14/2015  Recommended Consults (No Data)  Oral Care Recommendations Oral care BID;Staff/trained caregiver to provide oral care  Other Recommendations --      CHL IP FOLLOW UP RECOMMENDATIONS 08/14/2015  Follow up Recommendations (No Data)      CHL IP FREQUENCY AND DURATION 08/14/2015  Speech Therapy Frequency (ACUTE ONLY) min 2x/week  Treatment Duration --           CHL IP ORAL PHASE 08/14/2015  Oral Phase Impaired  Oral - Pudding Teaspoon --  Oral - Pudding Cup --  Oral - Honey Teaspoon --  Oral - Honey Cup --  Oral - Nectar Teaspoon --  Oral - Nectar Cup --  Oral - Nectar Straw --  Oral - Thin Teaspoon --  Oral - Thin Cup --  Oral - Thin Straw --  Oral - Puree --  Oral - Mech Soft --  Oral - Regular --  Oral - Multi-Consistency --  Oral - Pill --  Oral Phase - Comment min. diffuse oral residue post swallowing; slight leakage at bilat. corners of mouth drinking from a cup    CHL IP PHARYNGEAL PHASE 08/14/2015  Pharyngeal Phase Impaired  Pharyngeal- Pudding Teaspoon --  Pharyngeal --  Pharyngeal- Pudding Cup --  Pharyngeal --  Pharyngeal- Honey Teaspoon --  Pharyngeal --  Pharyngeal- Honey Cup --  Pharyngeal --  Pharyngeal- Nectar Teaspoon --  Pharyngeal --  Pharyngeal- Nectar Cup --  Pharyngeal --  Pharyngeal- Nectar Straw --  Pharyngeal --  Pharyngeal- Thin Teaspoon --  Pharyngeal --  Pharyngeal- Thin Cup  --  Pharyngeal --  Pharyngeal- Thin Straw --  Pharyngeal --  Pharyngeal- Puree --  Pharyngeal --  Pharyngeal- Mechanical Soft --  Pharyngeal --  Pharyngeal- Regular --  Pharyngeal --  Pharyngeal- Multi-consistency --  Pharyngeal --  Pharyngeal- Pill --  Pharyngeal --  Pharyngeal Comment delayed pharyngeal swallow initiation w/ liquids; residue from the oral cavity spilled to, and filled, the valleculae and pyriform sinuses b/f pt exhibited a f/u, dry swallow involuntarily.      CHL IP CERVICAL ESOPHAGEAL PHASE 08/14/2015  Cervical Esophageal Phase WFL  Pudding Teaspoon --  Pudding Cup --  Honey Teaspoon --  Honey Cup --  Nectar Teaspoon --  Nectar Cup --  Nectar Straw --  Thin Teaspoon --  Thin Cup --  Thin Straw --  Puree --  Mechanical Soft --  Regular --  Multi-consistency --  Pill --  Cervical Esophageal Comment --     Jerilynn Som, MS, CCC-SLP  Watson,Katherine 08/14/2015, 3:23 PM

## 2015-08-14 NOTE — Consult Note (Signed)
Cardiology Consultation Note  Patient ID: [name], MRN: [id number], DOB/AGE: 1929/03/23 82 y.o. Admit date: 08/13/2015   Date of Consult: 08/14/2015 Primary Physician: Pcp Not In System Primary Cardiologist: Dr. Mariah Milling  Chief Complaint: infection of parotid and submandibular glands Reason for Consult: Afib with RVR in the setting of infection of parotid and submandibular glands  HPI: 82 y.o. female with h/o 82 y.o. female with h/o chronic Afib on ELiquis, chronic systolic CHF, HTN, cognitive impairment, gait instability, depression, panic attacks, and Parkinson's disease who presented to [facility] on 2/12 with a submandibular and parotid gland infection which led to Afib with RVR.   Prior admission to [facility] in 08/2014 for Afib with RVR. Echo at that time showed EF 45-50%, dilated LA of 4.5 cm, dilated RA, moderate to severe MR, moderate TR, mildly elevated PASP at 45.2 mm Hg. Head CT showed chronic infarcts along the right frontal and temporal lobes, with associated encephalomalacia. Over the past several months her fatigue has worsened and she is now mostly wheelchair bound. She was admitted to [facility] 2/12 with right sided facial swelling and fever. Maxillofacial CT showed extensive inflammatory changes with diffuse involvement of the right parotid gland and right submandibular gland. She has been febrile. She was started on vancomycin and Zosyn by IM. She has developed Afib with RVR with HR in to the 140's secondary to her infection, which has improved into the low 100's currently. ENT recommends ABX and steroids at this time.   Past Medical History  Diagnosis Date  . Persistent atrial fibrillation (HCC)     a. Eliquis 2.5 mg bid  . Chronic systolic CHF (congestive heart failure) (HCC)     a. echo 07/2014: EF 45-50%, dilated LA at 4.5 cm, mildly dilated RA, mod-sev MR, mod TR, mildly elevated PASP @ 45.2  . HTN (hypertension)   . Cognitive impairment   . Gait instability   . Depression   . Panic  attacks   . Arrhythmia   . Arthritis   . Hyperlipidemia   . Parkinson's disease (HCC)   . Anxiety disorder       Most Recent Cardiac Studies: As above   Surgical History:  Past Surgical History  Procedure Laterality Date  . Eye surgery       Home Meds: Prior to Admission medications   Medication Sig Start Date End Date Taking? Authorizing Provider  acetaminophen (TYLENOL) 325 MG tablet Take 650 mg by mouth every 6 (six) hours as needed for mild pain, moderate pain or fever.   Yes Historical Provider, MD  Amantadine HCl 100 MG tablet Take 100 mg by mouth 2 (two) times daily.  06/27/14  Yes Historical Provider, MD  docusate sodium (COLACE) 100 MG capsule Take 100 mg by mouth 2 (two) times daily.   Yes Historical Provider, MD  ELIQUIS 2.5 MG TABS tablet Take 2.5 mg by mouth 2 (two) times daily.  07/18/14  Yes Historical Provider, MD  fexofenadine (ALLEGRA) 180 MG tablet Take 180 mg by mouth daily.   Yes Historical Provider, MD  furosemide (LASIX) 20 MG tablet Take 20 mg by mouth 2 (two) times daily.  07/25/14  Yes Historical Provider, MD  guaiFENesin (ROBITUSSIN) 100 MG/5ML liquid Take 200 mg by mouth every 6 (six) hours as needed for cough.   Yes Historical Provider, MD  ipratropium-albuterol (DUONEB) 0.5-2.5 (3) MG/3ML SOLN Take 3 mLs by nebulization every 6 (six) hours as needed (for shortness of breath/wheezing.).    Yes Historical Provider, MD  levothyroxine (SYNTHROID, LEVOTHROID) 50 MCG tablet Take 50 mcg by mouth daily after breakfast.  07/18/14  Yes Historical Provider, MD  magnesium hydroxide (MILK OF MAGNESIA) 400 MG/5ML suspension Take 5 mLs by mouth 4 (four) times daily as needed for mild constipation or moderate constipation.   Yes Historical Provider, MD  metoprolol (LOPRESSOR) 50 MG tablet Take 50 mg by mouth 2 (two) times daily.  07/18/14  Yes Historical Provider, MD  montelukast (SINGULAIR) 10 MG tablet Take 10 mg by mouth at bedtime.    Yes Historical Provider, MD    Multiple Vitamin (MULTIVITAMIN) tablet Take 1 tablet by mouth daily.   Yes Historical Provider, MD  PARoxetine (PAXIL) 20 MG tablet Take 20 mg by mouth every morning.    Yes Historical Provider, MD  potassium chloride SA (K-DUR,KLOR-CON) 20 MEQ tablet Take 20 mEq by mouth daily.  07/15/14  Yes Historical Provider, MD  chlorpheniramine-HYDROcodone (TUSSIONEX PENNKINETIC ER) 10-8 MG/5ML SUER Take 5 mLs by mouth 2 (two) times daily. Patient not taking: Reported on 06/26/2015 06/08/15   Irean Hong, MD  chlorpheniramine-HYDROcodone [facility] ER) 10-8 MG/5ML SUER Take 5 mLs by mouth 2 (two) times daily. 06/26/15   Rebecka Apley, MD  levofloxacin (LEVAQUIN) 750 MG tablet Take 1 tablet (750 mg total) by mouth daily. Patient not taking: Reported on 06/26/2015 06/08/15   Irean Hong, MD  predniSONE (DELTASONE) 20 MG tablet Take 3 tablets (60 mg total) by mouth daily. 06/26/15   Rebecka Apley, MD    Inpatient Medications:  . amantadine  100 mg Oral BID  . antiseptic oral rinse  7 mL Mouth Rinse BID  . apixaban  5 mg Oral BID  . Chlorhexidine Gluconate Cloth  6 each Topical Q0600  . dexamethasone  10 mg Intravenous Q12H  . docusate sodium  100 mg Oral BID  . levothyroxine  50 mcg Oral QAC breakfast  . loratadine  10 mg Oral Daily  . metoprolol  50 mg Oral BID  . montelukast  10 mg Oral QHS  . mupirocin ointment  1 application Nasal BID  . nystatin  5 mL Oral QID  . PARoxetine  20 mg Oral BH-q7a  . piperacillin-tazobactam (ZOSYN)  IV  3.375 g Intravenous 3 times per day  . potassium chloride SA  20 mEq Oral BID  . sodium chloride flush  3 mL Intravenous Q12H  . vancomycin  1,000 mg Intravenous Q24H      Allergies:  Allergies  Allergen Reactions  . Aliskiren-Amlodipine-Hctz Other (See Comments)  . Sulfa Antibiotics Swelling    Social History   Social History  . Marital Status: Single    Spouse Name: N/A  . Number of Children: N/A  . Years of Education: N/A    Occupational History  . Not on file.   Social History Main Topics  . Smoking status: Never Smoker   . Smokeless tobacco: Not on file  . Alcohol Use: No  . Drug Use: No  . Sexual Activity: Not on file   Other Topics Concern  . Not on file   Social History Narrative     Family History  Problem Relation Age of Onset  . Heart disease Other      Review of Systems: Review of Systems  Constitutional: Positive for fever, chills, weight loss and malaise/fatigue. Negative for diaphoresis.  HENT: Negative for congestion.   Eyes: Negative for discharge and redness.  Respiratory: Negative for cough, hemoptysis, sputum production, shortness of breath and  wheezing.   Cardiovascular: Negative for chest pain, palpitations, orthopnea, claudication, leg swelling and PND.  Gastrointestinal: Negative for nausea and vomiting.  Musculoskeletal: Negative for falls.  Skin: Negative for rash.  Neurological: Positive for weakness. Negative for dizziness, sensory change, speech change, focal weakness and loss of consciousness.  Endo/Heme/Allergies: Does not bruise/bleed easily.  Psychiatric/Behavioral: The patient is not nervous/anxious.   All other systems reviewed and are negative.   Labs: No results for input(s): CKTOTAL, CKMB, TROPONINI in the last 72 hours. Lab Results  Component Value Date   WBC 11.8* 08/14/2015   HGB 10.1* 08/14/2015   HCT 30.3* 08/14/2015   MCV 97.4 08/14/2015   PLT 214 08/14/2015    Recent Labs Lab 08/13/15 0845  08/14/15 0500  NA 141  --  142  K 2.8*  < > 4.5  CL 101  --  113*  CO2 30  --  23  BUN 17  --  16  CREATININE 0.95  --  0.69  CALCIUM 8.7*  --  8.3*  PROT 6.9  --   --   BILITOT 1.0  --   --   ALKPHOS 101  --   --   ALT 36  --   --   AST 43*  --   --   GLUCOSE 114*  --  106*  < > = values in this interval not displayed. No results found for: CHOL, HDL, LDLCALC, TRIG No results found for: DDIMER  Radiology/Studies:  Ct Maxillofacial  W/cm  08/13/2015  CLINICAL DATA:  Right-sided facial pain and swelling. The swelling is new since yesterday. EXAM: CT MAXILLOFACIAL WITH CONTRAST TECHNIQUE: Multidetector CT imaging of the maxillofacial structures was performed with intravenous contrast. Multiplanar CT image reconstructions were also generated. A small metallic BB was placed on the right temple in order to reliably differentiate right from left. CONTRAST:  75mL OMNIPAQUE IOHEXOL 300 MG/ML  SOLN COMPARISON:  CT head without contrast 07/14/2014. FINDINGS: There is marked a inflammation throughout the right parotid gland without a discrete lesion. The gland is enlarged and hyperdense. Extensive subcutaneous stranding is present as well with thickening of the scan. This extends into the right temporal area and inferiorly into the right neck. The right submandibular gland is enlarged as well. There is stranding and fluid surrounding the right submandibular gland. The platysma is thickened on the right. No significant ductal dilation or stone is present in either the right submandibular duct or the parotid duct. There is edema and enlargement of the right masseter as well is the medial pterygoid. Inflammatory changes extend into the masticator space. Reactive type right level 1 and level 2 lymph nodes are noted. Mild prevertebral edema is present. There is asymmetric mucosal thickening along the right side of the oral pharynx. No discrete mass lesion is present. Atherosclerotic calcifications are present at both carotid bifurcations without significant stenosis. Degenerative changes are present in the upper cervical spine with slight anterolisthesis at C5-6. The patient is edentulous. IMPRESSION: 1. Extensive inflammatory changes with diffuse involvement of the right parotid gland, the right submandibular gland, and the right masticator space. No focal etiology is evident. Specifically there is no evidence for obstruction of either the right parotid or  right submandibular duct. 2. Extensive subcutaneous edema and skin thickening overlying these areas as well compatible with cellulitis. No discrete abscess is evident. 3. Reactive right-sided adenopathy. Electronically Signed   By: Marin Roberts M.D.   On: 08/13/2015 10:45    EKG: Afib, 89  bpm, artifact V2, poor R wave progression, nonspecific st/t changes   Weights: Filed Weights   08/13/15 0823 08/14/15 0646  Weight: 156 lb 8.4 oz (71 kg) 154 lb 14.4 oz (70.262 kg)     Physical Exam: Blood pressure 107/65, pulse 103, temperature 97.5 F (36.4 C), temperature source Oral, resp. rate 18, height 5\' 4"  (1.626 m), weight 154 lb 14.4 oz (70.262 kg), SpO2 93 %. Body mass index is 26.58 kg/(m^2). General: Well developed, well nourished, in no acute distress. Head: Normocephalic, atraumatic, sclera non-icteric, no xanthomas, nares are without discharge. Mild STS and erythema of the right mandible and proximal neck. Neck: Negative for carotid bruits. JVD not elevated. Lungs: Clear bilaterally to auscultation without wheezes, rales, or rhonchi. Breathing is unlabored. Heart: RRR with S1 S2. No murmurs, rubs, or gallops appreciated. Abdomen: Soft, non-tender, non-distended with normoactive bowel sounds. No hepatomegaly. No rebound/guarding. No obvious abdominal masses. Msk:  Strength and tone appear normal for age. Extremities: No clubbing or cyanosis. No edema.  Distal pedal pulses are 2+ and equal bilaterally. Neuro: Alert and oriented X 3. No facial asymmetry. No focal deficit. Moves all extremities spontaneously. Psych:  Responds to questions appropriately with a normal affect.    Assessment and Plan:    1. 1. Afib with RVR: Currently with HR int he low 100's to 1-teens in the setting of right parotid and submandibular infection. On Eliquis. Would aim for rate control with HR less than 100. Will change Lopressor to 25 mg q 6 hours for rate control. She may need diltiazem for added rate  control with hold parameters while dealing with the above infection. Continue Eliquis at this time as ENT is not planning for invasive procedure (no abscess). CHADS2VASC 5 (CHF, HTN, age x 2, female)  2. Chronic systolic CHF: She does not appear to be volume overloaded at this time. Continue current medications.   3. Parotid and submandibular infection: Driving #1. Per IM  4. Cognitive impairment: At baseline.   5. Gait instability: Mostly wheelchair bound.   6. Dysphagia: Seen by speech, thin liquids advised.   Elinor Dodge, PA-C Pager: [phone] 08/14/2015, 2:45 PM

## 2015-08-15 DIAGNOSIS — I4891 Unspecified atrial fibrillation: Secondary | ICD-10-CM | POA: Insufficient documentation

## 2015-08-15 DIAGNOSIS — E876 Hypokalemia: Secondary | ICD-10-CM | POA: Insufficient documentation

## 2015-08-15 DIAGNOSIS — K1121 Acute sialoadenitis: Principal | ICD-10-CM | POA: Insufficient documentation

## 2015-08-15 DIAGNOSIS — A419 Sepsis, unspecified organism: Secondary | ICD-10-CM

## 2015-08-15 LAB — WOUND CULTURE

## 2015-08-15 LAB — BASIC METABOLIC PANEL
Anion gap: 11 (ref 5–15)
BUN: 17 mg/dL (ref 6–20)
CALCIUM: 8.3 mg/dL — AB (ref 8.9–10.3)
CO2: 23 mmol/L (ref 22–32)
CREATININE: 0.89 mg/dL (ref 0.44–1.00)
Chloride: 102 mmol/L (ref 101–111)
GFR calc non Af Amer: 57 mL/min — ABNORMAL LOW (ref >60)
GLUCOSE: 127 mg/dL — AB (ref 65–99)
Potassium: 3.6 mmol/L (ref 3.5–5.1)
Sodium: 136 mmol/L (ref 135–145)

## 2015-08-15 MED ORDER — LOPERAMIDE HCL 2 MG PO CAPS
4.0000 mg | ORAL_CAPSULE | Freq: Once | ORAL | Status: AC
  Administered 2015-08-15: 4 mg via ORAL
  Filled 2015-08-15: qty 2

## 2015-08-15 MED ORDER — PREDNISONE 50 MG PO TABS
50.0000 mg | ORAL_TABLET | Freq: Every day | ORAL | Status: DC
  Administered 2015-08-16: 50 mg via ORAL
  Filled 2015-08-15: qty 1

## 2015-08-15 MED ORDER — LOPERAMIDE HCL 2 MG PO CAPS: 2.0000 mg | ORAL_CAPSULE | ORAL | Status: DC | PRN

## 2015-08-15 NOTE — Progress Notes (Signed)
Patient: [name] / Admit Date: 08/13/2015 / Date of Encounter: 08/15/2015, 10:24 AM   Subjective: SOB last evening requiring IV Lasix x 1 with improvement. Now much better. No chest pain or tachy-palpitations. Heart rate is improving on metoprolol 25 mg q 6 hours. She has yet to receive her medications this morning with heart rate in the low 100's.   Review of Systems: Review of Systems  Unable to perform ROS: mental acuity  Constitutional: Positive for malaise/fatigue. Negative for fever, chills, weight loss and diaphoresis.  HENT: Negative for congestion.   Eyes: Negative for discharge and redness.  Respiratory: Negative for cough, hemoptysis, sputum production, shortness of breath and wheezing.   Cardiovascular: Negative for chest pain, palpitations, orthopnea, claudication, leg swelling and PND.  Gastrointestinal: Negative for nausea and vomiting.  Musculoskeletal: Negative for falls.  Skin: Negative for rash.  Neurological: Positive for weakness. Negative for sensory change, speech change, focal weakness and loss of consciousness.  Endo/Heme/Allergies: Does not bruise/bleed easily.  Psychiatric/Behavioral: The patient is not nervous/anxious.     Objective: Telemetry: Afib, low 100's to 1-teens Physical Exam: Blood pressure 135/73, pulse 64, temperature 98.5 F (36.9 C), temperature source Oral, resp. rate 20, height  (1.626 m), weight 154 lb 14.4 oz (70.262 kg), SpO2 97 %. Body mass index is 26.58 kg/(m^2). General: Well developed, well nourished, in no acute distress. Head: Normocephalic, atraumatic, sclera non-icteric, no xanthomas, nares are without discharge. Neck: Negative for carotid bruits. JVP not elevated. Lungs: Expiratory wheezing. Breathing is unlabored. Heart: Irregularly-irregular, S1 S2 without murmurs, rubs, or gallops.  Abdomen: Obese, soft, non-tender, non-distended with normoactive bowel sounds. No rebound/guarding. Extremities: No clubbing or  cyanosis. No edema. Distal pedal pulses are 2+ and equal bilaterally. Neuro: Alert and oriented X 3. Moves all extremities spontaneously. Psych:  Responds to questions appropriately with a normal affect.   Intake/Output Summary (Last 24 hours) at 08/15/15 1024 Last data filed at 08/15/15 0748  Gross per 24 hour  Intake      0 ml  Output    650 ml  Net   -650 ml    Inpatient Medications:  . amantadine  100 mg Oral BID  . antiseptic oral rinse  7 mL Mouth Rinse BID  . apixaban  5 mg Oral BID  . Chlorhexidine Gluconate Cloth  6 each Topical Q0600  . dexamethasone  10 mg Intravenous Q12H  . docusate sodium  100 mg Oral BID  . levothyroxine  50 mcg Oral QAC breakfast  . loratadine  10 mg Oral Daily  . metoprolol  25 mg Oral Q6H  . montelukast  10 mg Oral QHS  . mupirocin ointment  1 application Nasal BID  . nystatin  5 mL Oral QID  . PARoxetine  20 mg Oral BH-q7a  . piperacillin-tazobactam (ZOSYN)  IV  3.375 g Intravenous 3 times per day  . potassium chloride SA  20 mEq Oral BID  . sodium chloride flush  3 mL Intravenous Q12H  . vancomycin  1,000 mg Intravenous Q24H   Infusions:    Labs:  Recent Labs  08/13/15 1742 08/14/15 0500 08/15/15 0420  NA  --  142 136  K 3.5 4.5 3.6  CL  --  113* 102  CO2  --  23 23  GLUCOSE  --  106* 127*  BUN  --  16 17  CREATININE  --  0.69 0.89  CALCIUM  --  8.3* 8.3*  MG 1.8  --   --  Recent Labs  08/13/15 0845  AST 43*  ALT 36  ALKPHOS 101  BILITOT 1.0  PROT 6.9  ALBUMIN 2.9*    Recent Labs  08/13/15 0845 08/14/15 0500  WBC 11.4* 11.8*  NEUTROABS 9.8*  --   HGB 10.8* 10.1*  HCT 31.9* 30.3*  MCV 97.5 97.4  PLT 244 214   No results for input(s): CKTOTAL, CKMB, TROPONINI in the last 72 hours. Invalid input(s): POCBNP No results for input(s): HGBA1C in the last 72 hours.   Weights: Filed Weights   08/13/15 0823 08/14/15 0646  Weight: 156 lb 8.4 oz (71 kg) 154 lb 14.4 oz (70.262 kg)     Radiology/Studies:    Ct Maxillofacial W/cm  08/13/2015  CLINICAL DATA:  Right-sided facial pain and swelling. The swelling is new since yesterday. EXAM: CT MAXILLOFACIAL WITH CONTRAST TECHNIQUE: Multidetector CT imaging of the maxillofacial structures was performed with intravenous contrast. Multiplanar CT image reconstructions were also generated. A small metallic BB was placed on the right temple in order to reliably differentiate right from left. CONTRAST:  75mL OMNIPAQUE IOHEXOL 300 MG/ML  SOLN COMPARISON:  CT head without contrast 07/14/2014. FINDINGS: There is marked a inflammation throughout the right parotid gland without a discrete lesion. The gland is enlarged and hyperdense. Extensive subcutaneous stranding is present as well with thickening of the scan. This extends into the right temporal area and inferiorly into the right neck. The right submandibular gland is enlarged as well. There is stranding and fluid surrounding the right submandibular gland. The platysma is thickened on the right. No significant ductal dilation or stone is present in either the right submandibular duct or the parotid duct. There is edema and enlargement of the right masseter as well is the medial pterygoid. Inflammatory changes extend into the masticator space. Reactive type right level 1 and level 2 lymph nodes are noted. Mild prevertebral edema is present. There is asymmetric mucosal thickening along the right side of the oral pharynx. No discrete mass lesion is present. Atherosclerotic calcifications are present at both carotid bifurcations without significant stenosis. Degenerative changes are present in the upper cervical spine with slight anterolisthesis at C5-6. The patient is edentulous. IMPRESSION: 1. Extensive inflammatory changes with diffuse involvement of the right parotid gland, the right submandibular gland, and the right masticator space. No focal etiology is evident. Specifically there is no evidence for obstruction of either the  right parotid or right submandibular duct. 2. Extensive subcutaneous edema and skin thickening overlying these areas as well compatible with cellulitis. No discrete abscess is evident. 3. Reactive right-sided adenopathy. Electronically Signed   By: Marin Roberts M.D.   On: 08/13/2015 10:45     Assessment and Plan   1. Afib with RVR: Currently with HR int he low 100's to 1-teens in the setting of right parotid and submandibular infection. On Eliquis. Would aim for rate control with HR less than 100. Continue Lopressor to 25 mg q 6 hours for rate control. This has improved her heart rate. Should her heart rate remain tachycardic in the setting of her infection would add diltiazem 30 mg q 6 hours with hold parameters. Could consider digoxin as well. Continue Eliquis at this time as ENT is not planning for invasive procedure (no abscess). CHADS2VASC 5 (CHF, HTN, age x 2, female)  2. Mild acute on chronic systolic CHF: She does not appear to be volume overloaded at this time. She required IV Lasix x 1 last evening, likely in the setting of IV  fluids. Limit IV fluids. Continue current medications.   3. Parotid and submandibular infection: Driving #1. Per IM  4. Cognitive impairment: At baseline.   5. Gait instability: Mostly wheelchair bound.   6. Dysphagia: Seen by speech, thin liquids advised.  Elinor Dodge, PA-C Pager: [phone] 08/15/2015, 10:24 AM    Attending Note Patient seen and examined, agree with detailed note above,  Patient presentation and plan discussed on rounds.   Heart rate on telemetry averaging 100 up to 110 at rest Patient with less distress, less pain on the right side of her face today Still not at her baseline Tolerating metoprolol 25 mg every 6 --Would agree with adding diltiazem 30 mg every 6 with hold parameters  blood pressure is adequate but will need to be monitored Would continue anticoagulation  Heart rate may improve as parotid infection  resolves and discomfort improves We'll continue to follow   Greater than 50% was spent in counseling and coordination of care with patient Total encounter time 25 minutes or more   Signed: Dossie Arbour  M.D., Ph.D. [facility]

## 2015-08-15 NOTE — Progress Notes (Signed)
[facility] - [place] at [facility]   PATIENT NAME: [name]    MR#:  [id number]  DATE OF BIRTH:  Aug 17, 1928  SUBJECTIVE:   Patient is doing well this morning. Neck pain has improved.  REVIEW OF SYSTEMS:    Review of Systems  Constitutional: Negative for fever, chills and malaise/fatigue.  HENT: Negative for sore throat.        Pain is improved  Eyes: Negative for blurred vision and double vision.  Respiratory: Negative for hemoptysis, shortness of breath and wheezing.   Cardiovascular: Negative for chest pain, palpitations and leg swelling.  Gastrointestinal: Negative for nausea, vomiting, abdominal pain, diarrhea and blood in stool.  Genitourinary: Negative for dysuria.  Musculoskeletal: Negative for back pain.  Neurological: Negative for dizziness, tremors and headaches.  Endo/Heme/Allergies: Does not bruise/bleed easily.    Tolerating Diet:yes      DRUG ALLERGIES:   Allergies  Allergen Reactions  . Aliskiren-Amlodipine-Hctz Other (See Comments)  . Sulfa Antibiotics Swelling    VITALS:  Blood pressure 135/73, pulse 64, temperature 98.5 F (36.9 C), temperature source Oral, resp. rate 20, height  (1.626 m), weight 70.262 kg (154 lb 14.4 oz), SpO2 97 %.  PHYSICAL EXAMINATION:   Physical Exam  Constitutional: She is well-developed, well-nourished, and in no distress. No distress.  HENT:  Head: Normocephalic.  Right neck hot and tender to touch submandibular and parotid gland sight  Eyes: No scleral icterus.  Neck: Normal range of motion. Neck supple. No JVD present. No tracheal deviation present.  Cardiovascular: Normal rate and normal heart sounds.  Exam reveals no gallop and no friction rub.   No murmur heard. Irr, irr  Pulmonary/Chest: Effort normal and breath sounds normal. No respiratory distress. She has no wheezes. She has no rales. She exhibits no tenderness.  Abdominal: Soft. Bowel sounds are normal. She exhibits no  distension and no mass. There is no tenderness. There is no rebound and no guarding.  Musculoskeletal: Normal range of motion. She exhibits no edema.  Neurological: She is alert.  Skin: Skin is warm. No rash noted. No erythema.  Psychiatric: Affect and judgment normal.      LABORATORY PANEL:   CBC  Recent Labs Lab 08/14/15 0500  WBC 11.8*  HGB 10.1*  HCT 30.3*  PLT 214   ------------------------------------------------------------------------------------------------------------------  Chemistries   Recent Labs Lab 08/13/15 0845 08/13/15 1742  08/15/15 0420  NA 141  --   < > 136  K 2.8* 3.5  < > 3.6  CL 101  --   < > 102  CO2 30  --   < > 23  GLUCOSE 114*  --   < > 127*  BUN 17  --   < > 17  CREATININE 0.95  --   < > 0.89  CALCIUM 8.7*  --   < > 8.3*  MG  --  1.8  --   --   AST 43*  --   --   --   ALT 36  --   --   --   ALKPHOS 101  --   --   --   BILITOT 1.0  --   --   --   < > = values in this interval not displayed. ------------------------------------------------------------------------------------------------------------------  Cardiac Enzymes No results for input(s): TROPONINI in the last 168 hours. ------------------------------------------------------------------------------------------------------------------  RADIOLOGY:  Ct Maxillofacial W/cm  08/13/2015  CLINICAL DATA:  Right-sided facial pain and swelling. The swelling is new since yesterday.  EXAM: CT MAXILLOFACIAL WITH CONTRAST TECHNIQUE: Multidetector CT imaging of the maxillofacial structures was performed with intravenous contrast. Multiplanar CT image reconstructions were also generated. A small metallic BB was placed on the right temple in order to reliably differentiate right from left. CONTRAST:  75mL OMNIPAQUE IOHEXOL 300 MG/ML  SOLN COMPARISON:  CT head without contrast 07/14/2014. FINDINGS: There is marked a inflammation throughout the right parotid gland without a discrete lesion. The gland  is enlarged and hyperdense. Extensive subcutaneous stranding is present as well with thickening of the scan. This extends into the right temporal area and inferiorly into the right neck. The right submandibular gland is enlarged as well. There is stranding and fluid surrounding the right submandibular gland. The platysma is thickened on the right. No significant ductal dilation or stone is present in either the right submandibular duct or the parotid duct. There is edema and enlargement of the right masseter as well is the medial pterygoid. Inflammatory changes extend into the masticator space. Reactive type right level 1 and level 2 lymph nodes are noted. Mild prevertebral edema is present. There is asymmetric mucosal thickening along the right side of the oral pharynx. No discrete mass lesion is present. Atherosclerotic calcifications are present at both carotid bifurcations without significant stenosis. Degenerative changes are present in the upper cervical spine with slight anterolisthesis at C5-6. The patient is edentulous. IMPRESSION: 1. Extensive inflammatory changes with diffuse involvement of the right parotid gland, the right submandibular gland, and the right masticator space. No focal etiology is evident. Specifically there is no evidence for obstruction of either the right parotid or right submandibular duct. 2. Extensive subcutaneous edema and skin thickening overlying these areas as well compatible with cellulitis. No discrete abscess is evident. 3. Reactive right-sided adenopathy. Electronically Signed   By: Marin Roberts M.D.   On: 08/13/2015 10:45     ASSESSMENT AND PLAN:   82 year old female with history of persistent atrial fibrillation, chronic systolic heart failure EF of 45-50% and essential hypertension who presented with right sided neck pain and found to have Extensive inflammatory changes with diffuse involvement of the right parotid gland, the right submandibular gland, and  the right masticator space.  1. Right parotid gland and some mandibular gland infection: Patient has much improvement. Continue vancomycin and Zosyn. Change IV Decadron to by mouth prednisone.   2. History of atrial fibrillation: Patient has elevated heart rates in the setting of problem #1.  Appreciate cardiology consultation.  Continue Lopressor 25 mg by mouth every 6 hours for rate control.  If needed diltiazem 30every 6 hours can also be ordered.  Continue anticoagulation with Eliquis  3. Hypothyroid: Continue Synthroid  4. Hypokalemia: Potassium is improved.   5. Mild acute on chronic systolic heart failure: Patient does not appear to be in volume overload at this time. She required one time IV Lasix last night for shortness of breath likely in the setting by the fluids.   6. Dysphagia: Patient has mild aspiration risk. Continue dysphagia diet with thin liquids. 7. History of thrush: Continue nystatin.  Management plans discussed with the patient's sister and she is in agreement.  CODE STATUS: DO NOT RESUSCITATE  TOTAL TIME TAKING CARE OF THIS PATIENT: 25 minutes.     POSSIBLE D/C Tomorrow, DEPENDING ON CLINICAL CONDITION.   Tashawn Greff M.D on 08/15/2015 at 11:20 AM  Between 7am to 6pm - Pager - [phone] After 6pm go to [email or web address] - password [facility]  Office  [phone]  CC: Primary care physician; Pcp Not In System  Note: This dictation was prepared with Dragon dictation along with smaller phrase technology. Any transcriptional errors that result from this process are unintentional.

## 2015-08-15 NOTE — Progress Notes (Signed)
Initial assessment patient lung sounds expiratory wheezing. Once patient family returned she informed me that patient seemed worse than when she left her. Respiratory notified.Breathing treatment given. Per respiratory patient sounded "wet" and congested. Reassessed patient. Conditioned worsened compared to prior assessment. MD notified. Acknowledged. New orders given. Reassessed patient after medication given. Patient feeling much better. Congestion gone. Patient breathing back at baseline. Will continue to monitor.

## 2015-08-16 ENCOUNTER — Telehealth: Payer: Self-pay

## 2015-08-16 LAB — BASIC METABOLIC PANEL
ANION GAP: 11 (ref 5–15)
BUN: 21 mg/dL — ABNORMAL HIGH (ref 6–20)
CALCIUM: 8.6 mg/dL — AB (ref 8.9–10.3)
CHLORIDE: 106 mmol/L (ref 101–111)
CO2: 24 mmol/L (ref 22–32)
Creatinine, Ser: 0.91 mg/dL (ref 0.44–1.00)
GFR, EST NON AFRICAN AMERICAN: 56 mL/min — AB (ref >60)
Glucose, Bld: 79 mg/dL (ref 65–99)
POTASSIUM: 3.9 mmol/L (ref 3.5–5.1)
Sodium: 141 mmol/L (ref 135–145)

## 2015-08-16 LAB — VANCOMYCIN, TROUGH: VANCOMYCIN TR: 17 ug/mL (ref 10–20)

## 2015-08-16 MED ORDER — CLINDAMYCIN HCL 150 MG PO CAPS
300.0000 mg | ORAL_CAPSULE | Freq: Three times a day (TID) | ORAL | Status: DC
  Administered 2015-08-16: 300 mg via ORAL
  Filled 2015-08-16 (×3): qty 2

## 2015-08-16 MED ORDER — APIXABAN 5 MG PO TABS: 5.0000 mg | ORAL_TABLET | Freq: Two times a day (BID) | ORAL | Status: AC

## 2015-08-16 MED ORDER — NYSTATIN 100000 UNIT/ML MT SUSP: 5.0000 mL | Freq: Four times a day (QID) | OROMUCOSAL | Status: DC

## 2015-08-16 MED ORDER — PREDNISONE 10 MG (21) PO TBPK
10.0000 mg | ORAL_TABLET | Freq: Every day | ORAL | Status: DC
Start: 2015-08-16 — End: 2015-09-20

## 2015-08-16 MED ORDER — MUPIROCIN 2 % EX OINT: 1.0000 "application " | TOPICAL_OINTMENT | Freq: Two times a day (BID) | CUTANEOUS | Status: DC

## 2015-08-16 MED ORDER — CLINDAMYCIN HCL 300 MG PO CAPS: 300.0000 mg | ORAL_CAPSULE | Freq: Three times a day (TID) | ORAL | Status: DC

## 2015-08-16 NOTE — Telephone Encounter (Signed)
-----   Message from Coralee Rud sent at 08/16/2015 10:18 AM EST ----- Regarding: tcm/ph 08/22/2015 Eula Listen, PA 11:00

## 2015-08-16 NOTE — Progress Notes (Signed)
Pharmacy Antibiotic Note  [name] is a 82 y.o. female admitted on 08/13/2015 with sepsis.  Pharmacy has been consulted for Vancomycin and Zosyn dosing.  Plan: 2/15 0900 Vancomycin trough=58mcg/ml which is therapeutic. Vancomycin and Zosyn are now discontinued and MD has transitioned patient to Clindamycin  PO q8h in preparation for discharge.  Height:  (162.6 cm) Weight: 155 lb 8 oz (70.534 kg) IBW/kg (Calculated) : 54.7  Temp (24hrs), Avg:98.1 F (36.7 C), Min:97.8 F (36.6 C), Max:98.3 F (36.8 C)   Recent Labs Lab 08/13/15 0845 08/13/15 1211 08/14/15 0500 08/15/15 0420 08/16/15 0907  WBC 11.4*  --  11.8*  --   --   CREATININE 0.95  --  0.69 0.89 0.91  LATICACIDVEN 1.3 1.0  --   --   --   VANCOTROUGH  --   --   --   --  17    Estimated Creatinine Clearance: 43.5 mL/min (by C-G formula based on Cr of 0.91).    Allergies  Allergen Reactions  . Aliskiren-Amlodipine-Hctz Other (See Comments)  . Sulfa Antibiotics Swelling    Antimicrobials this admission: Anti-infectives    Start     Dose/Rate Route Frequency Ordered Stop   08/16/15 1400  clindamycin (CLEOCIN) capsule 300 mg     300 mg Oral 3 times per day 08/16/15 0954     08/16/15 0000  clindamycin (CLEOCIN) 300 MG capsule     300 mg Oral 3 times daily 08/16/15 0953     08/14/15 0930  vancomycin (VANCOCIN) IVPB 1000 mg/200 mL premix  Status:  Discontinued     1,000 mg 200 mL/hr over 60 Minutes Intravenous Every 24 hours 08/13/15 1420 08/16/15 0802   08/13/15 1530  piperacillin-tazobactam (ZOSYN) IVPB 3.375 g  Status:  Discontinued     3.375 g 12.5 mL/hr over 240 Minutes Intravenous 3 times per day 08/13/15 1420 08/16/15 0802   08/13/15 0900  vancomycin (VANCOCIN) 1,500 mg in sodium chloride 0.9 % 500 mL IVPB     1,500 mg 250 mL/hr over 120 Minutes Intravenous  Once 08/13/15 0849 08/13/15 1132   08/13/15 0900  piperacillin-tazobactam (ZOSYN) IVPB 4.5 g     4.5 g 200 mL/hr over 30 Minutes Intravenous   Once 08/13/15 0849 08/13/15 0943      Dose adjustments this admission: Will check trough prior to 4th dose on 2/15 at 0900  Microbiology results: Results for orders placed or performed during the hospital encounter of 08/13/15  Blood Culture (routine x 2)     Status: None (Preliminary result)   Collection Time: 08/13/15  8:46 AM  Result Value Ref Range Status   Specimen Description BLOOD RIGHT FOREARM  Final   Special Requests BOTTLES DRAWN AEROBIC AND ANAEROBIC  1CC  Final   Culture NO GROWTH 3 DAYS  Final   Report Status PENDING  Incomplete  Blood Culture (routine x 2)     Status: None (Preliminary result)   Collection Time: 08/13/15  8:47 AM  Result Value Ref Range Status   Specimen Description BLOOD LEFT ANTECUBITAL  Final   Special Requests BOTTLES DRAWN AEROBIC AND ANAEROBIC  4CC  Final   Culture NO GROWTH 3 DAYS  Final   Report Status PENDING  Incomplete  Urine culture     Status: None   Collection Time: 08/13/15 11:12 AM  Result Value Ref Range Status   Specimen Description URINE, RANDOM  Final   Special Requests NONE  Final   Culture NO GROWTH 1 DAY  Final   Report Status 08/14/2015 FINAL  Final  Wound culture     Status: None   Collection Time: 08/13/15 12:11 PM  Result Value Ref Range Status   Specimen Description SALIVA  Final   Special Requests NONE  Final   Gram Stain   Final    FEW SQUAMOUS EPITHELIAL CELLS PRESENT FEW WBC SEEN MODERATE GRAM POSITIVE COCCI RARE GRAM NEGATIVE RODS    Culture   Final    HEAVY GROWTH METHICILLIN RESISTANT STAPHYLOCOCCUS AUREUS   Report Status 08/15/2015 FINAL  Final   Organism ID, Bacteria METHICILLIN RESISTANT STAPHYLOCOCCUS AUREUS  Final      Susceptibility   Methicillin resistant staphylococcus aureus - MIC*    CIPROFLOXACIN >=8 RESISTANT Resistant     ERYTHROMYCIN >=8 RESISTANT Resistant     GENTAMICIN <=0.5 SENSITIVE Sensitive     OXACILLIN >=4 RESISTANT Resistant     TETRACYCLINE <=1 SENSITIVE Sensitive      VANCOMYCIN 1 SENSITIVE Sensitive     TRIMETH/SULFA <=10 SENSITIVE Sensitive     CLINDAMYCIN <=0.25 SENSITIVE Sensitive     RIFAMPIN <=0.5 SENSITIVE Sensitive     Inducible Clindamycin NEGATIVE Sensitive     * HEAVY GROWTH METHICILLIN RESISTANT STAPHYLOCOCCUS AUREUS  MRSA PCR Screening     Status: Abnormal   Collection Time: 08/13/15  3:56 PM  Result Value Ref Range Status   MRSA by PCR POSITIVE (A) NEGATIVE Final    Comment:        The GeneXpert MRSA Assay (FDA approved for NASAL specimens only), is one component of a comprehensive MRSA colonization surveillance program. It is not intended to diagnose MRSA infection nor to guide or monitor treatment for MRSA infections. CRITICAL RESULT CALLED TO, READ BACK BY AND VERIFIED WITH: [facility] AT 1746 08/13/15 SDR     Thank you for allowing pharmacy to be a part of this patient's care.  Clovia Cuff, PharmD, BCPS 08/16/2015 10:31 AM

## 2015-08-16 NOTE — Progress Notes (Addendum)
Clinical Social Worker was informed that patient will be medically ready to discharge to [facility]. Patient and her sister Kathie Rhodes are in a agreement with plan. CSW called [facility] admissions at [facility] to confirm that patient's can return today. All discharge information faxed to [facility] via HUB. DNR, Face-to-Face and HH order added to discharge packet.   RN will call report and patient will discharge to [facility] via [facility]'s transportation today 08/16/15 at 1:30 PM  Woodroe Mode, MSW, LCSW-A Clinical Social Work Department [phone]

## 2015-08-16 NOTE — Progress Notes (Signed)
Patient is discharge to SNF  In a stable condition , summary and f/u care given to both pt's and sister , verbalised understanding ,left in facility [place]

## 2015-08-16 NOTE — Evaluation (Signed)
Physical Therapy Evaluation Patient Details Name: [name] MRN: [id number] DOB: March 17, 1929 Today's Date: 08/16/2015   History of Present Illness  Pt is a 82 y.o. female with PMH of HTN, cognitive impairment, parkinsons and CHF.  Pt was admitted for hypokalemia, A-Fib, sepsis and acute supproative parotitis.    Clinical Impression  Prior to admission pt was using wheelchair for community mobility and required assistance for al transfers.  Pt lives in assisted living center.  Pt required extra time and verbal cues for hand and foot placement during bed mobility, sit to stand and stand pivot transfer.  Pt required mod assist for bed mobility, mod assist x2 for sitting balance and max assist x2 for stand pivot transfer.  Standing with RW was attempted (max assist x2); however, pt could only hover above bed and could not fully stand.  Due to aforementioned function and strength deficits, pt is in need of skilled physical therapy.  It is recommended that pt is discharged to SNF when medically appropriate.     Follow Up Recommendations SNF    Equipment Recommendations       Recommendations for Other Services       Precautions / Restrictions Precautions Precautions: Fall Restrictions Weight Bearing Restrictions: Yes      Mobility  Bed Mobility Overal bed mobility: Needs Assistance Bed Mobility: Rolling;Sidelying to Sit Rolling: Mod assist Sidelying to sit: Mod assist;+2 for physical assistance       General bed mobility comments: increased time, verbal cues for hand and foot placement  Transfers Overall transfer level: Needs assistance Equipment used: Rolling walker (2 wheeled) Transfers: Sit to/from [employer] Transfers Sit to Stand: Max assist;+2 physical assistance (full stand not attained, hover over bed ) Stand pivot transfers: Max assist;+2 physical assistance       General transfer comment: increased time, verbal cues for hand and foot placement    Ambulation/Gait                Stairs            Wheelchair Mobility    Modified Rankin (Stroke Patients Only)       Balance Overall balance assessment: Needs assistance Sitting-balance support: Feet supported;Bilateral upper extremity supported (bedrail ) Sitting balance-Leahy Scale: Fair Sitting balance - Comments: posterior lean, moderate assistance x2 from PT concerning sitting balance  Postural control: Posterior lean Standing balance support: Bilateral upper extremity supported;Single extremity supported (Standing attempted but not attained) Standing balance-Leahy Scale: Zero                               Pertinent Vitals/Pain Pain Assessment: No/denies pain    Home Living Family/patient expects to be discharged to:: Assisted living               Home Equipment: Walker - 4 wheels;Wheelchair - manual      Prior Function Level of Independence: Needs assistance         Comments: Utilized wheelchair for community mobility, and assist for all transfers.     Hand Dominance        Extremity/Trunk Assessment   Upper Extremity Assessment: Generalized weakness  Grip strength:  Decreased R compared to L         Lower Extremity Assessment: Generalized weakness  Hip flexors:  R At least a 3/5 L: 2-/5 Quadriceps: B 2/5 Hamstrings: R at least a 3/5; L 2-/5 Dorsiflexors:  R at least a 3/5; L  2-/5 Plantar Flexors:  R at least a 3/5; L 2-/5 Hip Abductors: R at least a 3/5; L 2-/5 Hip Adductors: R at least a 3/5; L 2-/5      Cervical / Trunk Assessment: Normal  Communication   Communication: No difficulties  Cognition Arousal/Alertness: Awake/alert Behavior During Therapy: WFL for tasks assessed/performed Overall Cognitive Status: History of cognitive impairments - at baseline (sister present for affirmation of baseline)                      General Comments   Nursing was contacted and cleared pt for physical  therapy.  Pt was agreeable and tolerated session well.     Exercises        Assessment/Plan    PT Assessment Patient needs continued PT services  PT Diagnosis Generalized weakness   PT Problem List Decreased strength;Decreased range of motion;Decreased activity tolerance;Decreased balance;Decreased mobility;Decreased coordination  PT Treatment Interventions DME instruction;Gait training;Functional mobility training;Therapeutic activities;Therapeutic exercise;Patient/family education;Balance training   PT Goals (Current goals can be found in the Care Plan section) Acute Rehab PT Goals Patient Stated Goal: to go back to the assisted living community  PT Goal Formulation: With patient/family Time For Goal Achievement: 08/30/15 Potential to Achieve Goals: Fair    Frequency Min 2X/week   Barriers to discharge        Co-evaluation               End of Session Equipment Utilized During Treatment: Gait belt Activity Tolerance: Patient tolerated treatment well Patient left: in chair;with call bell/phone within reach;with chair alarm set;with family/visitor present Nurse Communication: Mobility status         Time: 1132-1202 PT Time Calculation (min) (ACUTE ONLY): 30 min   Charges:         PT G Codes:       Lyndel Safe, SPT Lyndel Safe 08/16/2015, 3:33 PM

## 2015-08-16 NOTE — Discharge Summary (Signed)
[facility] [place] [facility]   PATIENT NAME: [name]    MR#:  [id number]  DATE OF BIRTH:  May 28, 1929  DATE OF ADMISSION:  08/13/2015 ADMITTING PHYSICIAN: Milagros Loll, MD  DATE OF DISCHARGE: 08/16/2015  PRIMARY CARE PHYSICIAN: Pcp Not In System    ADMISSION DIAGNOSIS:  Hypokalemia [E87.6] Atrial fibrillation with RVR (HCC) [I48.91] Acute suppurative parotitis [K11.21] Sepsis, due to unspecified organism (HCC) [A41.9]  DISCHARGE DIAGNOSIS:  Active Problems:   Submandibular gland infection   Acute suppurative parotitis   Atrial fibrillation with RVR (HCC)   Hypokalemia   Sepsis (HCC)   Morbid obesity due to excess calories (HCC)   SECONDARY DIAGNOSIS:   Past Medical History  Diagnosis Date  . Persistent atrial fibrillation (HCC)     a. Eliquis 2.5 mg bid  . Chronic systolic CHF (congestive heart failure) (HCC)     a. echo 07/2014: EF 45-50%, dilated LA at 4.5 cm, mildly dilated RA, mod-sev MR, mod TR, mildly elevated PASP @ 45.2  . HTN (hypertension)   . Cognitive impairment   . Gait instability   . Depression   . Panic attacks   . Arthritis   . Hyperlipidemia   . Parkinson's disease (HCC)   . Anxiety disorder     HOSPITAL COURSE:   82 year old female with history of persistent atrial fibrillation, chronic systolic heart failure EF of 45-50% and essential hypertension who presented with right sided neck pain and found to have Extensive inflammatory changes with diffuse involvement of the right parotid gland, the right submandibular gland, and the right masticator space.  1. Right parotid gland and some mandibular gland infection: Patient has improved since admission. The swellingand pain have decreased. Cultures are showing MRSA. She will be discharged with CLINDAMYCIN and steroid taper and have ENT follow up.   2. History of atrial fibrillation: Patient has had elevated heart rates in the setting of problem #1.  Her heart  rates are better controlled. She will continue Metoprolol and will need follow up with CARDIOLOGY in 1 week. Continue anticoagulation with Eliquis  3. Hypothyroid: Continue Synthroid at discharge.  4. Hypokalemia: Potassium has improved with replacement.  5. Mild acute on chronic systolic heart failure: Patient does not appear to be in volume overload at this time. She required one time IV Lasix for shortness of breath likely in the setting of IV fluids.  She is not requiring oxygen and is comfortable on room air. She may continue LASIX at discharge.  6. Dysphagia: Patient has mild aspiration risk. Continue dysphagia 2 diet with thin liquids. 7. History of thrush: Continue nystatin.   DISCHARGE CONDITIONS AND DIET:   Stable for discharge heart healthy diet DYSPHAGIA 2 with thin liquids  CONSULTS OBTAINED:  Treatment Team:  Linus Salmons, MD Iran Ouch, MD  DRUG ALLERGIES:   Allergies  Allergen Reactions  . Aliskiren-Amlodipine-Hctz Other (See Comments)  . Sulfa Antibiotics Swelling    DISCHARGE MEDICATIONS:   Current Discharge Medication List    START taking these medications   Details  clindamycin (CLEOCIN) 300 MG capsule Take 1 capsule (300 mg total) by mouth 3 (three) times daily. Qty: 30 capsule, Refills: 0    mupirocin ointment (BACTROBAN) 2 % Place 1 application into the nose 2 (two) times daily. Qty: 22 g, Refills: 0    nystatin (MYCOSTATIN) 100000 UNIT/ML suspension Take 5 mLs (500,000 Units total) by mouth 4 (four) times daily. Qty: 60 mL, Refills: 0    predniSONE (  STERAPRED UNI-PAK 21 TAB) 10 MG (21) TBPK tablet Take 1 tablet (10 mg total) by mouth daily. Qty: 21 tablet, Refills: 0      CONTINUE these medications which have CHANGED   Details  apixaban (ELIQUIS) 5 MG TABS tablet Take 1 tablet (5 mg total) by mouth 2 (two) times daily. Qty: 60 tablet, Refills: 0      CONTINUE these medications which have NOT CHANGED   Details  acetaminophen  (TYLENOL) 325 MG tablet Take 650 mg by mouth every 6 (six) hours as needed for mild pain, moderate pain or fever.    Amantadine HCl 100 MG tablet Take 100 mg by mouth 2 (two) times daily.     docusate sodium (COLACE) 100 MG capsule Take 100 mg by mouth 2 (two) times daily.    fexofenadine (ALLEGRA) 180 MG tablet Take 180 mg by mouth daily.    furosemide (LASIX) 20 MG tablet Take 20 mg by mouth 2 (two) times daily.     guaiFENesin (ROBITUSSIN) 100 MG/5ML liquid Take 200 mg by mouth every 6 (six) hours as needed for cough.    ipratropium-albuterol (DUONEB) 0.5-2.5 (3) MG/3ML SOLN Take 3 mLs by nebulization every 6 (six) hours as needed (for shortness of breath/wheezing.).     levothyroxine (SYNTHROID, LEVOTHROID) 50 MCG tablet Take 50 mcg by mouth daily after breakfast.     magnesium hydroxide (MILK OF MAGNESIA) 400 MG/5ML suspension Take 5 mLs by mouth 4 (four) times daily as needed for mild constipation or moderate constipation.    metoprolol (LOPRESSOR) 50 MG tablet Take 50 mg by mouth 2 (two) times daily.     montelukast (SINGULAIR) 10 MG tablet Take 10 mg by mouth at bedtime.     Multiple Vitamin (MULTIVITAMIN) tablet Take 1 tablet by mouth daily.    PARoxetine (PAXIL) 20 MG tablet Take 20 mg by mouth every morning.     potassium chloride SA (K-DUR,KLOR-CON) 20 MEQ tablet Take 20 mEq by mouth daily.       STOP taking these medications     chlorpheniramine-HYDROcodone (TUSSIONEX PENNKINETIC ER) 10-8 MG/5ML SUER      chlorpheniramine-HYDROcodone (TUSSIONEX PENNKINETIC ER) 10-8 MG/5ML SUER      levofloxacin (LEVAQUIN) 750 MG tablet      predniSONE (DELTASONE) 20 MG tablet               Today   CHIEF COMPLAINT:  Doing well no neck pain or cough   VITAL SIGNS:  Blood pressure 129/88, pulse 103, temperature 98 F (36.7 C), temperature source Oral, resp. rate 18, height  (1.626 m), weight 70.534 kg (155 lb 8 oz), SpO2 96 %.   REVIEW OF SYSTEMS:  Review of  Systems  Constitutional: Negative for fever, chills and malaise/fatigue.  HENT: Negative for sore throat.   Eyes: Negative for blurred vision.  Respiratory: Negative for cough, hemoptysis, shortness of breath and wheezing.   Cardiovascular: Negative for chest pain, palpitations and leg swelling.  Gastrointestinal: Negative for nausea, vomiting, abdominal pain, diarrhea and blood in stool.  Genitourinary: Negative for dysuria.  Musculoskeletal: Negative for back pain.  Neurological: Negative for dizziness, tremors and headaches.  Endo/Heme/Allergies: Does not bruise/bleed easily.     PHYSICAL EXAMINATION:  GENERAL:  82 y.o.-year-old patient lying in the bed with no acute distress.  NECK:  Supple, no jugular venous distention. No thyroid enlargement, no tenderness.  LUNGS: Normal breath sounds bilaterally, no wheezing, rales,rhonchi  No use of accessory muscles of respiration.  CARDIOVASCULAR: irr,  irr, No murmurs, rubs, or gallops.  ABDOMEN: Soft, non-tender, non-distended. Bowel sounds present. No organomegaly or mass.  EXTREMITIES: No pedal edema, cyanosis, or clubbing.  PSYCHIATRIC: The patient is alert and oriented x 3.  SKIN: No obvious rash, lesion, or ulcer.   DATA REVIEW:   CBC  Recent Labs Lab 08/14/15 0500  WBC 11.8*  HGB 10.1*  HCT 30.3*  PLT 214    Chemistries   Recent Labs Lab 08/13/15 0845 08/13/15 1742  08/15/15 0420  NA 141  --   < > 136  K 2.8* 3.5  < > 3.6  CL 101  --   < > 102  CO2 30  --   < > 23  GLUCOSE 114*  --   < > 127*  BUN 17  --   < > 17  CREATININE 0.95  --   < > 0.89  CALCIUM 8.7*  --   < > 8.3*  MG  --  1.8  --   --   AST 43*  --   --   --   ALT 36  --   --   --   ALKPHOS 101  --   --   --   BILITOT 1.0  --   --   --   < > = values in this interval not displayed.  Cardiac Enzymes No results for input(s): TROPONINI in the last 168 hours.  Microbiology Results  @  RADIOLOGY:  No results found.    Management  plans discussed with the patient and she is in agreement. Stable for discharge   Patient should follow up with PCP in 1 week ENT 1 week and CARDIOLOGY 1 week  CODE STATUS:     Code Status Orders        Start     Ordered   08/13/15 1227  Do not attempt resuscitation (DNR)   Continuous    Question Answer Comment  In the event of cardiac or respiratory ARREST Do not call a "code blue"   In the event of cardiac or respiratory ARREST Do not perform Intubation, CPR, defibrillation or ACLS   In the event of cardiac or respiratory ARREST Use medication by any route, position, wound care, and other measures to relive pain and suffering. May use oxygen, suction and manual treatment of airway obstruction as needed for comfort.      08/13/15 1227    Code Status History    Date Active Date Inactive Code Status Order ID Comments User Context   This patient has a current code status but no historical code status.    Advance Directive Documentation        Most Recent Value   Type of Advance Directive  Out of facility DNR (pink MOST or yellow form)   Pre-existing out of facility DNR order (yellow form or pink MOST form)     "MOST" Form in Place?        TOTAL TIME TAKING CARE OF THIS PATIENT: 35 minutes.    Note: This dictation was prepared with Dragon dictation along with smaller phrase technology. Any transcriptional errors that result from this process are unintentional.  Lenka Zhao M.D on 08/16/2015 at 9:55 AM  Between 7am to 6pm - Pager - [phone] After 6pm go to [email or web address] - password [email or web address]  [facility]  Office  [phone]  CC: Primary care physician; Pcp Not In System

## 2015-08-16 NOTE — Telephone Encounter (Signed)
Attempted to contact pt regarding discharge from [facility] on 08/16/15. Left message asking pt to call back regarding discharge instructions and/or medications. Advised pt of appt w/ Eula Listen, PA on 08/22/15 at 11:00 w/ [facility]. Asked pt to call back if unable to keep this appt.

## 2015-08-16 NOTE — Progress Notes (Signed)
Patient having multiple soft bowel movement. Md notified. Acknowledged. New orders given. Will monitor.

## 2015-08-16 NOTE — Care Management Important Message (Signed)
Important Message  Patient Details  Name: [name] MRN: [id number] Date of Birth: 10-16-1928   Medicare Important Message Given:  Yes    Eber Hong, RN 08/16/2015, 10:45 AM

## 2015-08-19 LAB — CULTURE, BLOOD (ROUTINE X 2)
CULTURE: NO GROWTH
CULTURE: NO GROWTH

## 2015-08-21 ENCOUNTER — Encounter: Payer: Self-pay | Admitting: Physician Assistant

## 2015-08-22 ENCOUNTER — Encounter: Payer: Medicare Other | Admitting: Physician Assistant

## 2015-08-23 LAB — BLOOD GAS, VENOUS
Acid-Base Excess: 1 mmol/L (ref 0.0–3.0)
Bicarbonate: 27.7 mEq/L (ref 21.0–28.0)
PATIENT TEMPERATURE: 37
PH VEN: 7.36 (ref 7.320–7.430)
pCO2, Ven: 49 mmHg (ref 44.0–60.0)

## 2015-09-01 ENCOUNTER — Other Ambulatory Visit
Admission: RE | Admit: 2015-09-01 | Discharge: 2015-09-01 | Disposition: A | Discharge status: Home / Self Care | Payer: Medicare Other | Source: Ambulatory Visit | Attending: Cardiovascular Disease | Admitting: Cardiovascular Disease

## 2015-09-01 ENCOUNTER — Ambulatory Visit (INDEPENDENT_AMBULATORY_CARE_PROVIDER_SITE_OTHER): Payer: Medicare Other | Admitting: Cardiovascular Disease

## 2015-09-01 ENCOUNTER — Encounter: Payer: Self-pay | Admitting: Cardiovascular Disease

## 2015-09-01 VITALS — BP 124/77 | HR 91 | Ht 64.0 in | Wt 153.0 lb

## 2015-09-01 DIAGNOSIS — I4891 Unspecified atrial fibrillation: Secondary | ICD-10-CM | POA: Diagnosis present

## 2015-09-01 DIAGNOSIS — I10 Essential (primary) hypertension: Secondary | ICD-10-CM

## 2015-09-01 DIAGNOSIS — E876 Hypokalemia: Secondary | ICD-10-CM

## 2015-09-01 DIAGNOSIS — A419 Sepsis, unspecified organism: Secondary | ICD-10-CM | POA: Diagnosis not present

## 2015-09-01 DIAGNOSIS — I481 Persistent atrial fibrillation: Secondary | ICD-10-CM | POA: Diagnosis not present

## 2015-09-01 DIAGNOSIS — R600 Localized edema: Secondary | ICD-10-CM

## 2015-09-01 DIAGNOSIS — I4819 Other persistent atrial fibrillation: Secondary | ICD-10-CM

## 2015-09-01 LAB — BASIC METABOLIC PANEL
Anion gap: 10 (ref 5–15)
BUN: 19 mg/dL (ref 6–20)
CHLORIDE: 104 mmol/L (ref 101–111)
CO2: 30 mmol/L (ref 22–32)
CREATININE: 0.95 mg/dL (ref 0.44–1.00)
Calcium: 9.4 mg/dL (ref 8.9–10.3)
GFR calc Af Amer: >60 mL/min (ref >60)
GFR calc non Af Amer: 53 mL/min — ABNORMAL LOW (ref >60)
GLUCOSE: 91 mg/dL (ref 65–99)
Potassium: 4.3 mmol/L (ref 3.5–5.1)
Sodium: 144 mmol/L (ref 135–145)

## 2015-09-01 NOTE — Assessment & Plan Note (Signed)
Recent admission to the hospital, infection, treated with antibiotics Seems to have recovered well, back to her baseline

## 2015-09-01 NOTE — Assessment & Plan Note (Signed)
Blood pressure is well controlled on today's visit. No changes made to the medications. 

## 2015-09-01 NOTE — Assessment & Plan Note (Signed)
Likely dependent edema, Recommended leg elevation, compression hose

## 2015-09-01 NOTE — Patient Instructions (Addendum)
You are doing well.  We will draw labs today: BMET  Please make sure eliquis is being take twice a day (8 and 8 pm) Take lasix at 8 Am and 2 PM  Please call us if you have new issues that need to be addressed before your next appt.  Your physician wants you to follow-up in: 6 months.  You will receive a reminder letter in the mail two months in advance. If you don't receive a letter, please call our office to schedule the follow-up appointment.

## 2015-09-01 NOTE — Assessment & Plan Note (Signed)
We will confirm that she is taking eliquis 5 mg twice a day with her nursing facility She is full assist for all of her activities, no falls High risk of DVT and PE Heart rate adequately controlled on metoprolol

## 2015-09-01 NOTE — Assessment & Plan Note (Signed)
Recent hypokalemia in the hospital We have recommended repeat BMP. Will attempt to draw the office today   Total encounter time more than 25 minutes  Greater than 50% was spent in counseling and coordination of care with the patient

## 2015-09-01 NOTE — Progress Notes (Signed)
Patient ID: [name], female    DOB: Sep 25, 1928, 82 y.o.   MRN: [id number]  HPI Comments: [name] is a 82 year old woman with history of HTN, gait instability, cognitive impairment, depression, and panic attacks who returns for routine follow up of a-fib with RVR and chronic systolic CHF.   She lives in a skilled nursing facility, sister helps to take care of her  She presents today in a wheelchair. Recent hospitalization 08/13/2015 for parotiditis, sepsis, atrial fibrillation with RVR Treated with antibiotics with improvement of her symptoms Potassium was low at the time of admission  In follow-up today, daughter reports that she is doing well She is taking eliquis (unclear if she is taking this once or twice a day ) Taking Lasix 20 mg twice a day with potassium  Continues to have leg edema, no significant shortness of breath  Back on metoprolol 50 g twice a day  Neck swelling has resolved, scheduled to see ear nose throat  She did have a speech and swallow study while she was in the hospital  EKG on today's visit shows atrial fibrillation with ventricular rate 79 bpm, nonspecific ST abnormality  Other past medical history Recently changed skilled nursing facilities secondary to medication errors at the [facility]   essentially wheelchair bound as her legs are now very weak.  significant tremor.  She was in the hospital January 2016 for atrial fibrillation, acute systolic CHF  admitted to [facility] 1/15-1/18/2016   Prior to her above admission January 2016, over the past 2-3 weeks she noted a sensation of "hot" and would pat her chest. Per her sister her care providers felt like this was her anxiety. No work up was pursued at that time. She denied any chest pain. Ultimately, her weakness continued to progress to the point that she was unable to stand from a seated postion, prompting her to be brought to [facility] for further evaluation. Weight has remained stable.   Upon her arrival to  [facility] she was found to be in new onset a-fib with RVR, rates into the 160s at times, She was already on propranolol for her anxiety. Her TSH was found to be in the 6 range with normal free T4. Echo showed EF 45-50%, dilated LA of 4.5 cm, dilated RA, moderate to severe MR, moderate TR, mildly elevated PASP at 45.2 mm Hg.  Head CT showed chronic infarcts along the right frontal and temporal lobes, with associated encephalomalacia.   CHADSVASc score >= 6 giving her an annual estimated risk of stroke of 9%.     Allergies  Allergen Reactions  . Aliskiren-Amlodipine-Hctz Other (See Comments)  . Sulfa Antibiotics Swelling    Outpatient Encounter Prescriptions as of 09/01/2015  Medication Sig  . acetaminophen (TYLENOL) 325 MG tablet Take 650 mg by mouth every 6 (six) hours as needed for mild pain, moderate pain or fever.  . Amantadine HCl 100 MG tablet Take 100 mg by mouth 2 (two) times daily.   Marland Kitchen apixaban (ELIQUIS) 5 MG TABS tablet Take 1 tablet (5 mg total) by mouth 2 (two) times daily.  Marland Kitchen docusate sodium (COLACE) 100 MG capsule Take 100 mg by mouth 2 (two) times daily.  . fexofenadine (ALLEGRA) 180 MG tablet Take 180 mg by mouth daily.  . furosemide (LASIX) 20 MG tablet Take 20 mg by mouth 2 (two) times daily.   Marland Kitchen guaiFENesin (ROBITUSSIN) 100 MG/5ML liquid Take 200 mg by mouth every 6 (six) hours as needed for cough.  Marland Kitchen ipratropium-albuterol (  DUONEB) 0.5-2.5 (3) MG/3ML SOLN Take 3 mLs by nebulization every 6 (six) hours as needed (for shortness of breath/wheezing.).   Marland Kitchen levothyroxine (SYNTHROID, LEVOTHROID) 50 MCG tablet Take 50 mcg by mouth daily after breakfast.   . magnesium hydroxide (MILK OF MAGNESIA) 400 MG/5ML suspension Take 5 mLs by mouth 4 (four) times daily as needed for mild constipation or moderate constipation.  . metoprolol (LOPRESSOR) 50 MG tablet Take 50 mg by mouth 2 (two) times daily.   . montelukast (SINGULAIR) 10 MG tablet Take 10 mg by mouth at bedtime.   . Multiple Vitamin  (MULTIVITAMIN) tablet Take 1 tablet by mouth daily.  . mupirocin ointment (BACTROBAN) 2 % Place 1 application into the nose 2 (two) times daily.  Marland Kitchen PARoxetine (PAXIL) 20 MG tablet Take 20 mg by mouth every morning.   . potassium chloride SA (K-DUR,KLOR-CON) 20 MEQ tablet Take 20 mEq by mouth daily.   . clindamycin (CLEOCIN) 300 MG capsule Take 1 capsule (300 mg total) by mouth 3 (three) times daily. (Patient not taking: Reported on 09/01/2015)  . nystatin (MYCOSTATIN) 100000 UNIT/ML suspension Take 5 mLs (500,000 Units total) by mouth 4 (four) times daily. (Patient not taking: Reported on 09/01/2015)  . predniSONE (STERAPRED UNI-PAK 21 TAB) 10 MG (21) TBPK tablet Take 1 tablet (10 mg total) by mouth daily. (Patient not taking: Reported on 09/01/2015)   No facility-administered encounter medications on file as of 09/01/2015.    Past Medical History  Diagnosis Date  . Persistent atrial fibrillation (HCC)     a. Eliquis 5 mg bid; b. CHADS2VASc = 5 (CHF, HTN, age x 2, female)  . Chronic systolic CHF (congestive heart failure) (HCC)     a. echo 07/2014: EF 45-50%, dilated LA at 4.5 cm, mildly dilated RA, mod-sev MR, mod TR, mildly elevated PASP @ 45.2  . HTN (hypertension)   . Cognitive impairment   . Gait instability   . Depression   . Panic attacks   . Arthritis   . Hyperlipidemia   . Parkinson's disease (HCC)   . Anxiety disorder     Past Surgical History  Procedure Laterality Date  . Eye surgery      Social History  reports that she has never smoked. She does not have any smokeless tobacco history on file. She reports that she does not drink alcohol or use illicit drugs.  Family History family history includes Heart disease in her other.  Review of Systems  Constitutional: Negative.   Respiratory: Negative.   Cardiovascular: Positive for leg swelling.  Gastrointestinal: Negative.   Musculoskeletal: Positive for gait problem.  Neurological: Negative.   Hematological: Negative.    Psychiatric/Behavioral: Negative.   All other systems reviewed and are negative.   BP 124/77 mmHg  Pulse 91  Ht  (1.626 m)  Wt 153 lb (69.4 kg)  BMI 26.25 kg/m2   Physical Exam  Constitutional: She appears well-developed and well-nourished.  Presents in a wheelchair, relatively nonverbal  HENT:  Head: Normocephalic.  Nose: Nose normal.  Mouth/Throat: Oropharynx is clear and moist.  Eyes: Conjunctivae are normal. Pupils are equal, round, and reactive to light.  Neck: Normal range of motion. Neck supple. No JVD present.  Cardiovascular: Normal rate, S1 normal, S2 normal, normal heart sounds and intact distal pulses.  An irregularly irregular rhythm present. Exam reveals no gallop and no friction rub.   No murmur heard. Pulmonary/Chest: Effort normal and breath sounds normal. No respiratory distress. She has no wheezes. She has  no rales. She exhibits no tenderness.  Abdominal: Soft. Bowel sounds are normal. She exhibits no distension. There is no tenderness.  Musculoskeletal: Normal range of motion. She exhibits edema. She exhibits no tenderness.  Lymphadenopathy:    She has no cervical adenopathy.  Neurological: She is alert. Coordination normal.  Skin: Skin is warm and dry. No rash noted. No erythema.  Psychiatric:    Assessment and Plan  Nursing note and vitals reviewed.

## 2015-09-04 ENCOUNTER — Telehealth: Payer: Self-pay | Admitting: Cardiovascular Disease

## 2015-09-04 NOTE — Telephone Encounter (Signed)
° °  Pt c/o medication issue:  1. Name of Medication: Eliquis   2. How are you currently taking this medication (dosage and times per day)?  2.5 mg vs 5 mg po x 2 daily   3. Are you having a reaction (difficulty breathing--STAT)? No   4. What is your medication issue?  Patient sister thinks there is a medication discrepancy from hospital discharge.  Patient was taking Eliquis 2.5 mg po x 2 daily and now it has been changed to 5 mg po x 2 daily.

## 2015-09-04 NOTE — Telephone Encounter (Signed)
Per Dr. Mariah MillingGollan, continue Eliquis 5 mg BID.  Pt's daughter is aware and agreeable.

## 2015-09-15 ENCOUNTER — Encounter: Payer: Self-pay | Admitting: Emergency Medicine

## 2015-09-15 ENCOUNTER — Emergency Department
Admission: EM | Admit: 2015-09-15 | Discharge: 2015-09-15 | Disposition: A | Discharge status: Home / Self Care | Payer: Medicare Other | Source: Home / Self Care | Attending: Emergency Medicine | Admitting: Emergency Medicine

## 2015-09-15 ENCOUNTER — Emergency Department: Payer: Medicare Other

## 2015-09-15 DIAGNOSIS — I5022 Chronic systolic (congestive) heart failure: Secondary | ICD-10-CM | POA: Diagnosis present

## 2015-09-15 DIAGNOSIS — W19XXXA Unspecified fall, initial encounter: Secondary | ICD-10-CM

## 2015-09-15 DIAGNOSIS — Z7401 Bed confinement status: Secondary | ICD-10-CM

## 2015-09-15 DIAGNOSIS — I10 Essential (primary) hypertension: Secondary | ICD-10-CM | POA: Insufficient documentation

## 2015-09-15 DIAGNOSIS — Z79899 Other long term (current) drug therapy: Secondary | ICD-10-CM

## 2015-09-15 DIAGNOSIS — Z7901 Long term (current) use of anticoagulants: Secondary | ICD-10-CM | POA: Insufficient documentation

## 2015-09-15 DIAGNOSIS — I110 Hypertensive heart disease with heart failure: Secondary | ICD-10-CM | POA: Diagnosis present

## 2015-09-15 DIAGNOSIS — G20 Parkinson's disease: Secondary | ICD-10-CM | POA: Insufficient documentation

## 2015-09-15 DIAGNOSIS — Z888 Allergy status to other drugs, medicaments and biological substances status: Secondary | ICD-10-CM

## 2015-09-15 DIAGNOSIS — W06XXXA Fall from bed, initial encounter: Secondary | ICD-10-CM

## 2015-09-15 DIAGNOSIS — Y9289 Other specified places as the place of occurrence of the external cause: Secondary | ICD-10-CM | POA: Insufficient documentation

## 2015-09-15 DIAGNOSIS — Y9389 Activity, other specified: Secondary | ICD-10-CM

## 2015-09-15 DIAGNOSIS — S79912A Unspecified injury of left hip, initial encounter: Secondary | ICD-10-CM

## 2015-09-15 DIAGNOSIS — I481 Persistent atrial fibrillation: Secondary | ICD-10-CM | POA: Diagnosis present

## 2015-09-15 DIAGNOSIS — E860 Dehydration: Secondary | ICD-10-CM | POA: Diagnosis not present

## 2015-09-15 DIAGNOSIS — Z8249 Family history of ischemic heart disease and other diseases of the circulatory system: Secondary | ICD-10-CM

## 2015-09-15 DIAGNOSIS — Z882 Allergy status to sulfonamides status: Secondary | ICD-10-CM

## 2015-09-15 DIAGNOSIS — L8990 Pressure ulcer of unspecified site, unspecified stage: Secondary | ICD-10-CM | POA: Diagnosis present

## 2015-09-15 DIAGNOSIS — N390 Urinary tract infection, site not specified: Secondary | ICD-10-CM | POA: Diagnosis not present

## 2015-09-15 DIAGNOSIS — Y998 Other external cause status: Secondary | ICD-10-CM | POA: Insufficient documentation

## 2015-09-15 DIAGNOSIS — I482 Chronic atrial fibrillation: Secondary | ICD-10-CM | POA: Diagnosis present

## 2015-09-15 DIAGNOSIS — Z8673 Personal history of transient ischemic attack (TIA), and cerebral infarction without residual deficits: Secondary | ICD-10-CM

## 2015-09-15 DIAGNOSIS — R443 Hallucinations, unspecified: Secondary | ICD-10-CM | POA: Diagnosis present

## 2015-09-15 DIAGNOSIS — Z66 Do not resuscitate: Secondary | ICD-10-CM | POA: Diagnosis present

## 2015-09-15 DIAGNOSIS — M1990 Unspecified osteoarthritis, unspecified site: Secondary | ICD-10-CM | POA: Diagnosis present

## 2015-09-15 DIAGNOSIS — Z9889 Other specified postprocedural states: Secondary | ICD-10-CM

## 2015-09-15 DIAGNOSIS — E876 Hypokalemia: Secondary | ICD-10-CM | POA: Diagnosis present

## 2015-09-15 DIAGNOSIS — F0390 Unspecified dementia without behavioral disturbance: Secondary | ICD-10-CM | POA: Diagnosis present

## 2015-09-15 LAB — CBC WITH DIFFERENTIAL/PLATELET
BASOS PCT: 0 %
Basophils Absolute: 0 10*3/uL (ref 0–0.1)
EOS ABS: 0.1 10*3/uL (ref 0–0.7)
EOS PCT: 1 %
HCT: 33 % — ABNORMAL LOW (ref 35.0–47.0)
HEMOGLOBIN: 11.1 g/dL — AB (ref 12.0–16.0)
Lymphocytes Relative: 19 %
Lymphs Abs: 1.1 10*3/uL (ref 1.0–3.6)
MCH: 33.7 pg (ref 26.0–34.0)
MCHC: 33.5 g/dL (ref 32.0–36.0)
MCV: 100.5 fL — ABNORMAL HIGH (ref 80.0–100.0)
Monocytes Absolute: 1 10*3/uL — ABNORMAL HIGH (ref 0.2–0.9)
Monocytes Relative: 16 %
NEUTROS PCT: 64 %
Neutro Abs: 3.8 10*3/uL (ref 1.4–6.5)
PLATELETS: 211 10*3/uL (ref 150–440)
RBC: 3.28 MIL/uL — AB (ref 3.80–5.20)
RDW: 14.8 % — ABNORMAL HIGH (ref 11.5–14.5)
WBC: 6 10*3/uL (ref 3.6–11.0)

## 2015-09-15 LAB — URINALYSIS COMPLETE WITH MICROSCOPIC (ARMC ONLY)
BILIRUBIN URINE: NEGATIVE
Bacteria, UA: NONE SEEN
GLUCOSE, UA: NEGATIVE mg/dL
Hgb urine dipstick: NEGATIVE
KETONES UR: NEGATIVE mg/dL
Leukocytes, UA: NEGATIVE
NITRITE: NEGATIVE
PH: 6 (ref 5.0–8.0)
Protein, ur: NEGATIVE mg/dL
Specific Gravity, Urine: 1.013 (ref 1.005–1.030)
Squamous Epithelial / LPF: NONE SEEN

## 2015-09-15 LAB — COMPREHENSIVE METABOLIC PANEL
ALK PHOS: 69 U/L (ref 38–126)
ALT: 26 U/L (ref 14–54)
ANION GAP: 7 (ref 5–15)
AST: 28 U/L (ref 15–41)
Albumin: 3.4 g/dL — ABNORMAL LOW (ref 3.5–5.0)
BILIRUBIN TOTAL: 1 mg/dL (ref 0.3–1.2)
BUN: 17 mg/dL (ref 6–20)
CALCIUM: 9 mg/dL (ref 8.9–10.3)
CO2: 32 mmol/L (ref 22–32)
CREATININE: 1.16 mg/dL — AB (ref 0.44–1.00)
Chloride: 102 mmol/L (ref 101–111)
GFR, EST AFRICAN AMERICAN: 48 mL/min — AB (ref >60)
GFR, EST NON AFRICAN AMERICAN: 42 mL/min — AB (ref >60)
Glucose, Bld: 91 mg/dL (ref 65–99)
Potassium: 3.4 mmol/L — ABNORMAL LOW (ref 3.5–5.1)
SODIUM: 141 mmol/L (ref 135–145)
TOTAL PROTEIN: 6.7 g/dL (ref 6.5–8.1)

## 2015-09-15 LAB — TROPONIN I

## 2015-09-15 NOTE — ED Notes (Signed)
Pt states that she is unsure why she fell. Sister reports that pt has not previously fallen out of bed before, states that bed is close to floor.

## 2015-09-15 NOTE — ED Notes (Signed)
Pt presents [facility] ED [facility] with c/o of unwitnessed fall. EMS states pt experienced presenting sx this evening after she rolled out of bed and onto bedroom floor. Staff noticed pt on floor and called EMS. EMS states pt landed on left hip and is experiencing pain to affected side. No obvious deformity noted.

## 2015-09-15 NOTE — ED Notes (Signed)
Patient transported to CT 

## 2015-09-15 NOTE — ED Notes (Signed)
Patient transported to X-ray 

## 2015-09-15 NOTE — ED Notes (Signed)
MD at bedside. 

## 2015-09-15 NOTE — ED Notes (Signed)
Family at bedside. 

## 2015-09-15 NOTE — ED Notes (Signed)
Report given to Robin at [facility]

## 2015-09-15 NOTE — Discharge Instructions (Signed)
Fall Prevention in Hospitals, Adult As a hospital patient, your condition and the treatments you receive can increase your risk for falls. Some additional risk factors for falls in a hospital include:  Being in an unfamiliar environment.  Being on bed rest.  Your surgery.  Taking certain medicines.  Your tubing requirements, such as intravenous (IV) therapy or catheters. It is important that you learn how to decrease fall risks while at the hospital. Below are important tips that can help prevent falls. SAFETY TIPS FOR PREVENTING FALLS Talk about your risk of falling.  Ask your health care provider why you are at risk for falling. Is it your medicine, illness, tubing placement, or something else?  Make a plan with your health care provider to keep you safe from falls.  Ask your health care provider or pharmacist about side effects of your medicines. Some medicines can make you dizzy or affect your coordination. Ask for help.  Ask for help before getting out of bed. You may need to press your call button.  Ask for assistance in getting safely to the toilet.  Ask for a walker or cane to be put at your bedside. Ask that most of the side rails on your bed be placed up before your health care provider leaves the room.  Ask family or friends to sit with you.  Ask for things that are out of your reach, such as your glasses, hearing aids, telephone, bedside table, or call button. Follow these tips to avoid falling:  Stay lying or seated, rather than standing, while waiting for help.  Wear rubber-soled slippers or shoes whenever you walk in the hospital.  Avoid quick, sudden movements.  Change positions slowly.  Sit on the side of your bed before standing.  Stand up slowly and wait before you start to walk.  Let your health care provider know if there is a spill on the floor.  Pay careful attention to the medical equipment, electrical cords, and tubes around you.  When you  need help, use your call button by your bed or in the bathroom. Wait for one of your health care providers to help you.  If you feel dizzy or unsure of your footing, return to bed and wait for assistance.  Avoid being distracted by the TV, telephone, or another person in your room.  Do not lean or support yourself on rolling objects, such as IV poles or bedside tables.   This information is not intended to replace advice given to you by your health care provider. Make sure you discuss any questions you have with your health care provider.   Document Released: 06/14/2000 Document Revised: 07/08/2014 Document Reviewed: 02/23/2012 Elsevier Interactive Patient Education [employer]2016 [employer]   Please return for any further problems. Please follow with her regular doctor for difficulty she's been having walking.
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
## 2015-09-15 NOTE — ED Notes (Signed)
Report to Olivia, RN

## 2015-09-15 NOTE — ED Notes (Signed)
EMS called at 0723, pt dressed in bed and given apple juice, sister at bedside

## 2015-09-15 NOTE — ED Notes (Signed)
Pt back from CT

## 2015-09-15 NOTE — ED Notes (Signed)
Unwitnessed fall, pain with movement to left hip. No obvious deformity.

## 2015-09-17 ENCOUNTER — Emergency Department: Payer: Medicare Other

## 2015-09-17 ENCOUNTER — Inpatient Hospital Stay
Admission: EM | Admit: 2015-09-17 | Discharge: 2015-09-20 | DRG: 690 | Disposition: A | Discharge status: Home / Self Care | Payer: Medicare Other | Attending: Internal Medicine | Admitting: Internal Medicine

## 2015-09-17 DIAGNOSIS — N390 Urinary tract infection, site not specified: Secondary | ICD-10-CM

## 2015-09-17 DIAGNOSIS — R4182 Altered mental status, unspecified: Secondary | ICD-10-CM | POA: Diagnosis present

## 2015-09-17 DIAGNOSIS — L8990 Pressure ulcer of unspecified site, unspecified stage: Secondary | ICD-10-CM | POA: Insufficient documentation

## 2015-09-17 DIAGNOSIS — N289 Disorder of kidney and ureter, unspecified: Secondary | ICD-10-CM

## 2015-09-17 DIAGNOSIS — I5022 Chronic systolic (congestive) heart failure: Secondary | ICD-10-CM

## 2015-09-17 DIAGNOSIS — R410 Disorientation, unspecified: Secondary | ICD-10-CM

## 2015-09-17 DIAGNOSIS — E860 Dehydration: Secondary | ICD-10-CM

## 2015-09-17 LAB — COMPREHENSIVE METABOLIC PANEL
ALK PHOS: 81 U/L (ref 38–126)
ALT: 29 U/L (ref 14–54)
AST: 33 U/L (ref 15–41)
Albumin: 3.3 g/dL — ABNORMAL LOW (ref 3.5–5.0)
Anion gap: 4 — ABNORMAL LOW (ref 5–15)
BUN: 22 mg/dL — AB (ref 6–20)
CALCIUM: 9.1 mg/dL (ref 8.9–10.3)
CO2: 33 mmol/L — AB (ref 22–32)
CREATININE: 1.44 mg/dL — AB (ref 0.44–1.00)
Chloride: 107 mmol/L (ref 101–111)
GFR, EST AFRICAN AMERICAN: 37 mL/min — AB (ref >60)
GFR, EST NON AFRICAN AMERICAN: 32 mL/min — AB (ref >60)
Glucose, Bld: 120 mg/dL — ABNORMAL HIGH (ref 65–99)
Potassium: 3.2 mmol/L — ABNORMAL LOW (ref 3.5–5.1)
SODIUM: 144 mmol/L (ref 135–145)
Total Bilirubin: 0.6 mg/dL (ref 0.3–1.2)
Total Protein: 6.7 g/dL (ref 6.5–8.1)

## 2015-09-17 LAB — URINALYSIS COMPLETE WITH MICROSCOPIC (ARMC ONLY)
BILIRUBIN URINE: NEGATIVE
GLUCOSE, UA: NEGATIVE mg/dL
Hgb urine dipstick: NEGATIVE
Ketones, ur: NEGATIVE mg/dL
Nitrite: POSITIVE — AB
Protein, ur: NEGATIVE mg/dL
SQUAMOUS EPITHELIAL / LPF: NONE SEEN
Specific Gravity, Urine: 1.013 (ref 1.005–1.030)
pH: 6 (ref 5.0–8.0)

## 2015-09-17 LAB — CBC
HCT: 31.6 % — ABNORMAL LOW (ref 35.0–47.0)
Hemoglobin: 10.6 g/dL — ABNORMAL LOW (ref 12.0–16.0)
MCH: 34 pg (ref 26.0–34.0)
MCHC: 33.5 g/dL (ref 32.0–36.0)
MCV: 101.4 fL — AB (ref 80.0–100.0)
PLATELETS: 220 10*3/uL (ref 150–440)
RBC: 3.11 MIL/uL — AB (ref 3.80–5.20)
RDW: 15 % — AB (ref 11.5–14.5)
WBC: 6.9 10*3/uL (ref 3.6–11.0)

## 2015-09-17 LAB — LIPASE, BLOOD: LIPASE: 15 U/L (ref 11–51)

## 2015-09-17 LAB — PROTIME-INR
INR: 1.65
PROTHROMBIN TIME: 19.5 s — AB (ref 11.4–15.0)

## 2015-09-17 LAB — TROPONIN I: TROPONIN I: 0.04 ng/mL — AB (ref <0.031)

## 2015-09-17 MED ORDER — SODIUM CHLORIDE 0.9 % IV BOLUS (SEPSIS)
500.0000 mL | Freq: Once | INTRAVENOUS | Status: AC
  Administered 2015-09-17: 500 mL via INTRAVENOUS

## 2015-09-17 NOTE — ED Notes (Signed)
Pt presents via EMS from [place] with sister due to AMS. Pt seen here on last Thursday after a fall and evaluated without acute findings per EMS report. Sister reports pt has been altered mentally since Friday which is new for pt. Tonight sister thought pt needed to be reevaluated due to continued worsening AMS. Sister reports pt talking about "people that are dead".

## 2015-09-17 NOTE — ED Provider Notes (Signed)
[facility] Emergency Department Provider Note  ____________________________________________  Time seen: Approximately 9:31 PM  I have reviewed the triage vital signs and the nursing notes.   HISTORY  Chief Complaint Altered Mental Status   HPI [name] is a 82 y.o. female presents for evaluation of confusion.  The patient's sister reports that the patient is been confused since falling out of the bed on a few days ago. This confusion seems to be ongoing worse. The patient's sister is normally well oriented, however for the last couple days she's been confused, in addition she was hallucinating about seeing dead people today.  The patient denies being in pain. She states is aware she is at the hospital.  Sister reports the patient is bedridden at baseline. She continues to be on Eliquis. She has not fallen or injured herself again. She has not been complaining of pain but they just think that she seems a bit off and more confused.    Past Medical History  Diagnosis Date  . Persistent atrial fibrillation (HCC)     a. Eliquis 5 mg bid; b. CHADS2VASc = 5 (CHF, HTN, age x 2, female)  . Chronic systolic CHF (congestive heart failure) (HCC)     a. echo 07/2014: EF 45-50%, dilated LA at 4.5 cm, mildly dilated RA, mod-sev MR, mod TR, mildly elevated PASP @ 45.2  . HTN (hypertension)   . Cognitive impairment   . Gait instability   . Depression   . Panic attacks   . Arthritis   . Hyperlipidemia   . Parkinson's disease (HCC)   . Anxiety disorder     Patient Active Problem List   Diagnosis Date Noted  . Acute suppurative parotitis   . Atrial fibrillation with RVR (HCC)   . Hypokalemia   . Sepsis (HCC)   . Morbid obesity due to excess calories (HCC)   . Submandibular gland infection 08/13/2015  . Bilateral leg edema 10/24/2014  . Chronic atrial fibrillation (HCC) 08/23/2014  . Chronic systolic CHF (congestive heart failure) (HCC)   . Persistent atrial  fibrillation (HCC)   . HTN (hypertension)   . Cognitive impairment   . Gait instability   . Depression   . Panic attacks     Past Surgical History  Procedure Laterality Date  . Eye surgery      Current Outpatient Rx  Name  Route  Sig  Dispense  Refill  . acetaminophen (TYLENOL) 325 MG tablet   Oral   Take 650 mg by mouth every 6 (six) hours as needed for mild pain, moderate pain or fever.         . Amantadine HCl 100 MG tablet   Oral   Take 100 mg by mouth 2 (two) times daily.          Marland Kitchen. apixaban (ELIQUIS) 5 MG TABS tablet   Oral   Take 1 tablet (5 mg total) by mouth 2 (two) times daily.   60 tablet   0   . docusate sodium (COLACE) 100 MG capsule   Oral   Take 100 mg by mouth 2 (two) times daily.         . fexofenadine (ALLEGRA) 180 MG tablet   Oral   Take 180 mg by mouth daily.         . furosemide (LASIX) 20 MG tablet   Oral   Take 20 mg by mouth 2 (two) times daily.          .Marland Kitchen  guaiFENesin (ROBITUSSIN) 100 MG/5ML liquid   Oral   Take 200 mg by mouth every 6 (six) hours as needed for cough.         Marland Kitchen ipratropium-albuterol (DUONEB) 0.5-2.5 (3) MG/3ML SOLN   Nebulization   Take 3 mLs by nebulization every 6 (six) hours as needed (for shortness of breath/wheezing.).          Marland Kitchen levothyroxine (SYNTHROID, LEVOTHROID) 50 MCG tablet   Oral   Take 50 mcg by mouth daily after breakfast.          . magnesium hydroxide (MILK OF MAGNESIA) 400 MG/5ML suspension   Oral   Take 5 mLs by mouth 4 (four) times daily as needed for mild constipation or moderate constipation.         . metoprolol (LOPRESSOR) 50 MG tablet   Oral   Take 50 mg by mouth 2 (two) times daily.          . montelukast (SINGULAIR) 10 MG tablet   Oral   Take 10 mg by mouth at bedtime.          . Multiple Vitamin (MULTIVITAMIN) tablet   Oral   Take 1 tablet by mouth daily.         . mupirocin ointment (BACTROBAN) 2 %   Nasal   Place 1 application into the nose 2 (two)  times daily.   22 g   0   . nystatin (MYCOSTATIN) 100000 UNIT/ML suspension   Oral   Take 5 mLs (500,000 Units total) by mouth 4 (four) times daily.   60 mL   0   . PARoxetine (PAXIL) 20 MG tablet   Oral   Take 20 mg by mouth every morning.          . potassium chloride SA (K-DUR,KLOR-CON) 20 MEQ tablet   Oral   Take 20 mEq by mouth daily.          . clindamycin (CLEOCIN) 300 MG capsule   Oral   Take 1 capsule (300 mg total) by mouth 3 (three) times daily. Patient not taking: Reported on 09/01/2015   30 capsule   0   . predniSONE (STERAPRED UNI-PAK 21 TAB) 10 MG (21) TBPK tablet   Oral   Take 1 tablet (10 mg total) by mouth daily. Patient not taking: Reported on 09/01/2015   21 tablet   0     Allergies Aliskiren-amlodipine-hctz and Sulfa antibiotics  Family History  Problem Relation Age of Onset  . Heart disease Other     Social History Social History  Substance Use Topics  . Smoking status: Never Smoker   . Smokeless tobacco: None  . Alcohol Use: No    Review of Systems Constitutional: No fever/chills Eyes: No visual changes. ENT: No sore throat. Cardiovascular: Denies chest pain. Respiratory: Denies shortness of breath. Gastrointestinal: No abdominal pain.  No nausea, no vomiting.  No diarrhea.  No constipation. Genitourinary: Negative for dysuria. Musculoskeletal: Negative for back pain. Skin: Negative for rash. Neurological: Negative for headaches, focal weakness or numbness.  Chronic severe weakness in the lower legs and chronic swelling in both legs.  10-point ROS otherwise negative.  ____________________________________________   PHYSICAL EXAM:  VITAL SIGNS: ED Triage Vitals  Enc Vitals Group     BP 09/17/15 2026 118/80 mmHg     Pulse Rate 09/17/15 2026 60     Resp 09/17/15 2026 14     Temp 09/17/15 2026 98.6 F (37 C)     Temp  Source 09/17/15 2026 Oral     SpO2 09/17/15 2026 96 %     Weight 09/17/15 2026 180 lb (81.647 kg)      Height 09/17/15 2026 5\' 4"  (1.626 m)     Head Cir --      Peak Flow --      Pain Score --      Pain Loc --      Pain Edu? --      Excl. in GC? --    Constitutional: Alert and oriented to self, but slightly somnolent and is confused to date. No acute distress. Eyes: Conjunctivae are normal. PERRL. EOMI. Head: Atraumatic. Nose: No congestion/rhinnorhea. Mouth/Throat: Mucous membranes are dry.  Oropharynx non-erythematous. Neck: No stridor.   Cardiovascular: Normal rate, regular rhythm. Grossly normal heart sounds.  Good peripheral circulation. Respiratory: Normal respiratory effort.  No retractions. Lungs CTAB. Gastrointestinal: Soft and nontender. No distention. No abdominal bruits. No CVA tenderness. Musculoskeletal: No lower extremity tenderness though she does have 3+ lower extremity pitting edema which is evidently chronic and actually slightly better than normal per the patient's sister.  No joint effusions. Neurologic:  Normal speech and language. No gross focal neurologic deficits are appreciated. She is able to lift both arms equally but does report pain due to rotator cuff problems on both sides. Lower extremities are weak bilaterally with approximately 2 out of 5 strength which the patient's family reports is chronic. Skin:  Skin is warm, dry and intact. No rash noted. Psychiatric: Mood and affect are calm. Speech and behavior are normal.  ____________________________________________   LABS (all labs ordered are listed, but only abnormal results are displayed)  Labs Reviewed  COMPREHENSIVE METABOLIC PANEL - Abnormal; Notable for the following:    Potassium 3.2 (*)    CO2 33 (*)    Glucose, Bld 120 (*)    BUN 22 (*)    Creatinine, Ser 1.44 (*)    Albumin 3.3 (*)    GFR calc non Af Amer 32 (*)    GFR calc Af Amer 37 (*)    Anion gap 4 (*)    All other components within normal limits  CBC - Abnormal; Notable for the following:    RBC 3.11 (*)    Hemoglobin 10.6 (*)     HCT 31.6 (*)    MCV 101.4 (*)    RDW 15.0 (*)    All other components within normal limits  URINALYSIS COMPLETEWITH MICROSCOPIC ([facility] ONLY) - Abnormal; Notable for the following:    Color, Urine YELLOW (*)    APPearance CLEAR (*)    Nitrite POSITIVE (*)    Leukocytes, UA TRACE (*)    Bacteria, UA FEW (*)    All other components within normal limits  BLOOD GAS, VENOUS - Abnormal; Notable for the following:    pO2, Ven <31.0 (*)    Bicarbonate 34.9 (*)    Acid-Base Excess 8.1 (*)    All other components within normal limits  PROTIME-INR - Abnormal; Notable for the following:    Prothrombin Time 19.5 (*)    All other components within normal limits  TROPONIN I - Abnormal; Notable for the following:    Troponin I 0.04 (*)    All other components within normal limits  LIPASE, BLOOD   ____________________________________________  EKG  Reviewed and interpreted by me at 2040 Atrial fibrillation Heart rate 79 QRS 100 QTc 440 No evidence of ischemic abnormality ____________________________________________  RADIOLOGY  DG Chest Port 1 View (Final  result) Result time: 09/17/15 21:09:33   Final result by Rad Results In Interface (09/17/15 21:09:33)   Narrative:   CLINICAL DATA: Altered mental status  EXAM: PORTABLE CHEST 1 VIEW  COMPARISON: 09/15/2015  FINDINGS: The heart size and mediastinal contours are within normal limits. Both lungs are clear. The visualized skeletal structures are unremarkable.  IMPRESSION: No active disease.   Electronically Signed By: Alcide Clever M.D. On: 09/17/2015 21:09          CT Head Wo Contrast (Final result) Result time: 09/17/15 16:10:96   Final result by Rad Results In Interface (09/17/15 04:54:09)   Narrative:   CLINICAL DATA: Altered mental status. Seen here a few days ago after falling.  EXAM: CT HEAD WITHOUT CONTRAST  TECHNIQUE: Contiguous axial images were obtained from the base of the skull through  the vertex without intravenous contrast.  COMPARISON: 09/15/2015  FINDINGS: There is no intracranial hemorrhage, mass or evidence of acute infarction. There is encephalomalacia due to remote infarction in the right temporal and right frontal regions. This is unchanged from 07/14/2014. There is moderate generalized atrophy. There is white matter hypodensity, chronic and likely due to small vessel ischemic disease. No significant extra-axial fluid collection. No bony abnormality. The visible paranasal sinuses are clear.  IMPRESSION: No acute intracranial findings. 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 There is moderate generalized atrophy and chronic small vessel disease.   Electronically Signed By: Ellery Plunk M.D. On: 09/17/2015 21:08       ____________________________________________   PROCEDURES  Procedure(s) performed: None  Critical Care performed: No  ____________________________________________   INITIAL IMPRESSION / ASSESSMENT AND PLAN / ED COURSE  Pertinent labs & imaging results that were available during my care of the patient were reviewed by me and considered in my medical decision making (see chart for details).  Patient vents for confusion for the last 2-3 days. Slowly worsening. Labs indicate dehydration, some acute renal insufficiency, she does appear hypovolemic with dry mucous membranes. She is slightly somnolent but is alert, slightly disoriented at times. No evidence of acute stroke. Questionable acute urinary tract infection with mild bacteria and leukocytes, I will treat, culture.  No evidence of acute cardiopulmonary abnormality. We will admit for ongoing care given the patient's confusion, altered mental status and suspected UTI with associated renal insufficiency and dehydration. ____________________________________________   FINAL CLINICAL IMPRESSION(S) / ED DIAGNOSES  Final diagnoses:  Acute renal  insufficiency  Dehydration  Confusion  Urinary tract infection, acute      Sharyn Creamer, MD 09/18/15 0040

## 2015-09-18 ENCOUNTER — Inpatient Hospital Stay: Payer: Medicare Other

## 2015-09-18 ENCOUNTER — Encounter: Payer: Self-pay | Admitting: Internal Medicine

## 2015-09-18 DIAGNOSIS — I482 Chronic atrial fibrillation: Secondary | ICD-10-CM | POA: Diagnosis present

## 2015-09-18 DIAGNOSIS — R443 Hallucinations, unspecified: Secondary | ICD-10-CM | POA: Diagnosis present

## 2015-09-18 DIAGNOSIS — M1990 Unspecified osteoarthritis, unspecified site: Secondary | ICD-10-CM | POA: Diagnosis present

## 2015-09-18 DIAGNOSIS — E860 Dehydration: Secondary | ICD-10-CM | POA: Diagnosis present

## 2015-09-18 DIAGNOSIS — F0390 Unspecified dementia without behavioral disturbance: Secondary | ICD-10-CM | POA: Diagnosis present

## 2015-09-18 DIAGNOSIS — Z79899 Other long term (current) drug therapy: Secondary | ICD-10-CM | POA: Diagnosis not present

## 2015-09-18 DIAGNOSIS — I110 Hypertensive heart disease with heart failure: Secondary | ICD-10-CM | POA: Diagnosis present

## 2015-09-18 DIAGNOSIS — Z7401 Bed confinement status: Secondary | ICD-10-CM | POA: Diagnosis not present

## 2015-09-18 DIAGNOSIS — N390 Urinary tract infection, site not specified: Secondary | ICD-10-CM | POA: Diagnosis present

## 2015-09-18 DIAGNOSIS — Z66 Do not resuscitate: Secondary | ICD-10-CM | POA: Diagnosis present

## 2015-09-18 DIAGNOSIS — Z882 Allergy status to sulfonamides status: Secondary | ICD-10-CM | POA: Diagnosis not present

## 2015-09-18 DIAGNOSIS — Z8249 Family history of ischemic heart disease and other diseases of the circulatory system: Secondary | ICD-10-CM | POA: Diagnosis not present

## 2015-09-18 DIAGNOSIS — Z9889 Other specified postprocedural states: Secondary | ICD-10-CM | POA: Diagnosis not present

## 2015-09-18 DIAGNOSIS — R4182 Altered mental status, unspecified: Secondary | ICD-10-CM | POA: Diagnosis present

## 2015-09-18 DIAGNOSIS — L8990 Pressure ulcer of unspecified site, unspecified stage: Secondary | ICD-10-CM | POA: Diagnosis present

## 2015-09-18 DIAGNOSIS — I481 Persistent atrial fibrillation: Secondary | ICD-10-CM | POA: Diagnosis present

## 2015-09-18 DIAGNOSIS — Z7901 Long term (current) use of anticoagulants: Secondary | ICD-10-CM | POA: Diagnosis not present

## 2015-09-18 DIAGNOSIS — Z888 Allergy status to other drugs, medicaments and biological substances status: Secondary | ICD-10-CM | POA: Diagnosis not present

## 2015-09-18 DIAGNOSIS — I5022 Chronic systolic (congestive) heart failure: Secondary | ICD-10-CM | POA: Diagnosis present

## 2015-09-18 DIAGNOSIS — Z8673 Personal history of transient ischemic attack (TIA), and cerebral infarction without residual deficits: Secondary | ICD-10-CM | POA: Diagnosis not present

## 2015-09-18 DIAGNOSIS — E876 Hypokalemia: Secondary | ICD-10-CM | POA: Diagnosis present

## 2015-09-18 DIAGNOSIS — G20 Parkinson's disease: Secondary | ICD-10-CM | POA: Diagnosis present

## 2015-09-18 LAB — BASIC METABOLIC PANEL
Anion gap: 6 (ref 5–15)
BUN: 19 mg/dL (ref 6–20)
CHLORIDE: 111 mmol/L (ref 101–111)
CO2: 28 mmol/L (ref 22–32)
CREATININE: 1.21 mg/dL — AB (ref 0.44–1.00)
Calcium: 8.4 mg/dL — ABNORMAL LOW (ref 8.9–10.3)
GFR calc non Af Amer: 40 mL/min — ABNORMAL LOW (ref >60)
GFR, EST AFRICAN AMERICAN: 46 mL/min — AB (ref >60)
Glucose, Bld: 92 mg/dL (ref 65–99)
Potassium: 2.8 mmol/L — CL (ref 3.5–5.1)
Sodium: 145 mmol/L (ref 135–145)

## 2015-09-18 LAB — CBC
HCT: 29.2 % — ABNORMAL LOW (ref 35.0–47.0)
Hemoglobin: 9.8 g/dL — ABNORMAL LOW (ref 12.0–16.0)
MCH: 34 pg (ref 26.0–34.0)
MCHC: 33.5 g/dL (ref 32.0–36.0)
MCV: 101.3 fL — AB (ref 80.0–100.0)
PLATELETS: 202 10*3/uL (ref 150–440)
RBC: 2.88 MIL/uL — AB (ref 3.80–5.20)
RDW: 15.2 % — ABNORMAL HIGH (ref 11.5–14.5)
WBC: 6.1 10*3/uL (ref 3.6–11.0)

## 2015-09-18 LAB — MRSA PCR SCREENING: MRSA by PCR: NEGATIVE

## 2015-09-18 LAB — MAGNESIUM: Magnesium: 2 mg/dL (ref 1.7–2.4)

## 2015-09-18 MED ORDER — SODIUM CHLORIDE 0.9 % IV BOLUS (SEPSIS)
500.0000 mL | Freq: Once | INTRAVENOUS | Status: AC
  Administered 2015-09-18: 500 mL via INTRAVENOUS

## 2015-09-18 MED ORDER — POTASSIUM CHLORIDE 10 MEQ/100ML IV SOLN
10.0000 meq | INTRAVENOUS | Status: AC
  Administered 2015-09-18 (×2): 10 meq via INTRAVENOUS
  Filled 2015-09-18 (×2): qty 100

## 2015-09-18 MED ORDER — DEXTROSE 5 % IV SOLN
1.0000 g | INTRAVENOUS | Status: DC
  Administered 2015-09-18 – 2015-09-19 (×2): 1 g via INTRAVENOUS
  Filled 2015-09-18 (×3): qty 10

## 2015-09-18 MED ORDER — GUAIFENESIN 100 MG/5ML PO SOLN: 200.0000 mg | Freq: Four times a day (QID) | ORAL | Status: DC | PRN

## 2015-09-18 MED ORDER — PAROXETINE HCL 20 MG PO TABS
20.0000 mg | ORAL_TABLET | ORAL | Status: DC
  Administered 2015-09-18 – 2015-09-20 (×3): 20 mg via ORAL
  Filled 2015-09-18 (×4): qty 1

## 2015-09-18 MED ORDER — IPRATROPIUM-ALBUTEROL 0.5-2.5 (3) MG/3ML IN SOLN: 3.0000 mL | Freq: Four times a day (QID) | RESPIRATORY_TRACT | Status: DC | PRN

## 2015-09-18 MED ORDER — ACETAMINOPHEN 325 MG PO TABS: 650.0000 mg | ORAL_TABLET | Freq: Four times a day (QID) | ORAL | Status: DC | PRN

## 2015-09-18 MED ORDER — MUPIROCIN 2 % EX OINT
1.0000 "application " | TOPICAL_OINTMENT | Freq: Two times a day (BID) | CUTANEOUS | Status: DC
  Administered 2015-09-18: 1 via NASAL
  Filled 2015-09-18: qty 22

## 2015-09-18 MED ORDER — LEVOTHYROXINE SODIUM 50 MCG PO TABS
50.0000 ug | ORAL_TABLET | Freq: Every day | ORAL | Status: DC
  Administered 2015-09-18 – 2015-09-20 (×3): 50 ug via ORAL
  Filled 2015-09-18 (×3): qty 1

## 2015-09-18 MED ORDER — ONDANSETRON HCL 4 MG PO TABS: 4.0000 mg | ORAL_TABLET | Freq: Four times a day (QID) | ORAL | Status: DC | PRN

## 2015-09-18 MED ORDER — POTASSIUM CHLORIDE 20 MEQ PO PACK
40.0000 meq | PACK | Freq: Once | ORAL | Status: AC
  Administered 2015-09-18: 40 meq via ORAL
  Filled 2015-09-18: qty 2

## 2015-09-18 MED ORDER — MAGNESIUM HYDROXIDE 400 MG/5ML PO SUSP: 5.0000 mL | Freq: Four times a day (QID) | ORAL | Status: DC | PRN

## 2015-09-18 MED ORDER — ADULT MULTIVITAMIN W/MINERALS CH
1.0000 | ORAL_TABLET | Freq: Every day | ORAL | Status: DC
  Administered 2015-09-18 – 2015-09-20 (×3): 1 via ORAL
  Filled 2015-09-18 (×3): qty 1

## 2015-09-18 MED ORDER — ONDANSETRON HCL 4 MG/2ML IJ SOLN: 4.0000 mg | Freq: Four times a day (QID) | INTRAMUSCULAR | Status: DC | PRN

## 2015-09-18 MED ORDER — MONTELUKAST SODIUM 10 MG PO TABS
10.0000 mg | ORAL_TABLET | Freq: Every day | ORAL | Status: DC
  Administered 2015-09-18 – 2015-09-19 (×2): 10 mg via ORAL
  Filled 2015-09-18 (×2): qty 1

## 2015-09-18 MED ORDER — AMANTADINE HCL 100 MG PO CAPS
100.0000 mg | ORAL_CAPSULE | Freq: Two times a day (BID) | ORAL | Status: DC
  Administered 2015-09-18 – 2015-09-20 (×5): 100 mg via ORAL
  Filled 2015-09-18 (×5): qty 1

## 2015-09-18 MED ORDER — CEFTRIAXONE SODIUM 1 G IJ SOLR
1.0000 g | Freq: Once | INTRAMUSCULAR | Status: AC
  Administered 2015-09-18: 1 g via INTRAVENOUS
  Filled 2015-09-18: qty 10

## 2015-09-18 MED ORDER — NYSTATIN 100000 UNIT/ML MT SUSP
5.0000 mL | Freq: Four times a day (QID) | OROMUCOSAL | Status: DC
  Administered 2015-09-18: 500000 [IU] via ORAL
  Filled 2015-09-18 (×3): qty 5

## 2015-09-18 MED ORDER — POTASSIUM CHLORIDE CRYS ER 20 MEQ PO TBCR
40.0000 meq | EXTENDED_RELEASE_TABLET | Freq: Once | ORAL | Status: AC
  Administered 2015-09-18: 40 meq via ORAL
  Filled 2015-09-18: qty 2

## 2015-09-18 MED ORDER — APIXABAN 5 MG PO TABS
5.0000 mg | ORAL_TABLET | Freq: Two times a day (BID) | ORAL | Status: DC
  Administered 2015-09-18 – 2015-09-20 (×5): 5 mg via ORAL
  Filled 2015-09-18 (×5): qty 1

## 2015-09-18 MED ORDER — SODIUM CHLORIDE 0.9 % IV SOLN
INTRAVENOUS | Status: DC
  Administered 2015-09-18: 04:00:00 via INTRAVENOUS

## 2015-09-18 MED ORDER — METOPROLOL TARTRATE 50 MG PO TABS
50.0000 mg | ORAL_TABLET | Freq: Two times a day (BID) | ORAL | Status: DC
  Administered 2015-09-18 – 2015-09-20 (×5): 50 mg via ORAL
  Filled 2015-09-18 (×5): qty 1

## 2015-09-18 MED ORDER — DOCUSATE SODIUM 100 MG PO CAPS
100.0000 mg | ORAL_CAPSULE | Freq: Two times a day (BID) | ORAL | Status: DC
  Administered 2015-09-18 – 2015-09-20 (×5): 100 mg via ORAL
  Filled 2015-09-18 (×5): qty 1

## 2015-09-18 MED ORDER — SODIUM CHLORIDE 0.9% FLUSH
3.0000 mL | Freq: Two times a day (BID) | INTRAVENOUS | Status: DC
  Administered 2015-09-19 – 2015-09-20 (×3): 3 mL via INTRAVENOUS

## 2015-09-18 NOTE — Progress Notes (Signed)
Pts. Potassium level is 2.8. Dr. Tobi BastosPyreddy ordered potassium 40meq x1 and iv to run 10 meq x2 bags.

## 2015-09-18 NOTE — Progress Notes (Signed)
Dr Sherryll Burgershah notified of 1.97 sec pause per cardiac monitoring. Pt asx. Eating lunch. No chg plan of care

## 2015-09-18 NOTE — Clinical Social Work Note (Signed)
Clinical Social Work Assessment  Patient Details  Name: [name] MRN: [id number] Date of Birth: [birth year]  Date of referral:  09/18/15               Reason for consult:  Other (Comment Required) (From [facility] )                Permission sought to share information with:  Chartered certified accountant granted to share information::  Yes, Verbal Permission Granted  Name::      [facility]   Agency::     Relationship::     Contact Information:     Housing/Transportation Living arrangements for the past 2 months:  [place] of Information:  Patient, Power of [place], Other (Comment Required), Facility ([facility] [place] ) Patient Interpreter Needed:  None Criminal Activity/Legal Involvement Pertinent to Current Situation/Hospitalization:  No - Comment as needed Significant Relationships:  Siblings Lives with:  Facility Resident Do you feel safe going back to the place where you live?  Yes Need for family participation in patient care:  Yes (Comment)  Care giving concerns:  Patient is a resident at [facility] (fax: [phone])   Social Worker assessment / plan:  Clinical Social Worker (CSW) received consult that patient is from [facility]. CSW contacted [facility] and spoke with the director Mickel Baas. Per Mickel Baas patient has been at [facility] for 2 months now and moved from [facility] to [facility]. Per Mickel Baas patient was at [facility] for 16 years and the move was hard on the patient. Per Mickel Baas patient is wheel chair bound and requires assistance with ADL's such as bathing and feeding. Mickel Baas reported that patient's sister [name] is her primary contact and stays with patient during the day. Per Mickel Baas patient can return to [facility]. CSW made Mickel Baas aware that patient will likely D/C tomorrow. CSW met with patient and her sister [name] was at bedside. Patient was laying in bed and answered questions approprietly. Per sister she is patient's HPOA and is in  agreement with patient returning to [facility]. Per [name] patient was receiving home health PT and RN at [facility]. Per sister if [facility] cannot transport patient then she will go via EMS. CSW will continue to follow and assist as needed.    Employment status:  Disabled (Comment on whether or not currently receiving Disability), Retired Forensic scientist:  Information systems manager, Medicaid In [place] PT Recommendations:  Home with [place] / Referral to community resources:  Other (Comment Required) (Patient will return to ALF )  Patient/Family's Response to care:  Patient and sister are in agreement for patient to return to [facility].   Patient/Family's Understanding of and Emotional Response to Diagnosis, Current Treatment, and Prognosis:  Patient and sister were pleasant throughout assessment and thanked CSW for visit.   Emotional Assessment Appearance:  Appears stated age Attitude/Demeanor/Rapport:    Affect (typically observed):  Accepting, Adaptable, Pleasant Orientation:  Oriented to Self, Oriented to Place, Fluctuating Orientation (Suspected and/or reported Sundowners) Alcohol / Substance use:  Not Applicable Psych involvement (Current and /or in the community):  No (Comment)  Discharge Needs  Concerns to be addressed:  Discharge Planning Concerns Readmission within the last 30 days:  No Current discharge risk:  Chronically ill, Dependent with Mobility Barriers to Discharge:  Continued Medical Work up   Elwyn Reach 09/18/2015, 12:59 PM

## 2015-09-18 NOTE — ED Notes (Signed)
Report from rachel, rn.  

## 2015-09-18 NOTE — Progress Notes (Signed)
[facility] - [place] at [facility]   PATIENT NAME: [name]    MR#:  [id number]  DATE OF BIRTH:  Oct 31, 1928  SUBJECTIVE:  CHIEF COMPLAINT:   Chief Complaint  Patient presents with  . Altered Mental Status  some tremors (rt > lt), sister feeding, patient seems close to her baseline but sister is still worried about new onset confusion since last Friday (no new meds)  REVIEW OF SYSTEMS:  Review of Systems  Constitutional: Negative for fever, weight loss, malaise/fatigue and diaphoresis.  HENT: Negative for ear discharge, ear pain, hearing loss, nosebleeds, sore throat and tinnitus.   Eyes: Negative for blurred vision and pain.  Respiratory: Negative for cough, hemoptysis, shortness of breath and wheezing.   Cardiovascular: Negative for chest pain, palpitations, orthopnea and leg swelling.  Gastrointestinal: Negative for heartburn, nausea, vomiting, abdominal pain, diarrhea, constipation and blood in stool.  Genitourinary: Negative for dysuria, urgency and frequency.  Musculoskeletal: Positive for falls. Negative for myalgias and back pain.  Skin: Negative for itching and rash.  Neurological: Negative for dizziness, tingling, tremors, focal weakness, seizures, weakness and headaches.  Psychiatric/Behavioral: Positive for hallucinations. Negative for depression. The patient is nervous/anxious.     DRUG ALLERGIES:   Allergies  Allergen Reactions  . Aliskiren-Amlodipine-Hctz Other (See Comments)  . Sulfa Antibiotics Swelling   VITALS:  Blood pressure 115/62, pulse 100, temperature 96.8 F (36 C), temperature source Oral, resp. rate 18, height  (1.626 m), weight 69.4 kg (153 lb), SpO2 100 %. PHYSICAL EXAMINATION:  Physical Exam  Constitutional: She is oriented to person, place, and time and well-developed, well-nourished, and in no distress.  HENT:  Head: Normocephalic and atraumatic.  Eyes: Conjunctivae and EOM are normal. Pupils are equal,  round, and reactive to light.  Neck: Normal range of motion. Neck supple. No tracheal deviation present. No thyromegaly present.  Cardiovascular: Normal rate, regular rhythm and normal heart sounds.   Pulmonary/Chest: Effort normal and breath sounds normal. No respiratory distress. She has no wheezes. She exhibits no tenderness.  Abdominal: Soft. Bowel sounds are normal. She exhibits no distension. There is no tenderness.  Musculoskeletal: Normal range of motion.  Neurological: She is alert and oriented to person, place, and time. No cranial nerve deficit.  Skin: Skin is warm and dry. No rash noted.  Psychiatric: Mood and affect normal.   LABORATORY PANEL:   CBC  Recent Labs Lab 09/18/15 0424  WBC 6.1  HGB 9.8*  HCT 29.2*  PLT 202   ------------------------------------------------------------------------------------------------------------------ Chemistries   Recent Labs Lab 09/17/15 2128 09/18/15 0424  NA 144 145  K 3.2* 2.8*  CL 107 111  CO2 33* 28  GLUCOSE 120* 92  BUN 22* 19  CREATININE 1.44* 1.21*  CALCIUM 9.1 8.4*  AST 33  --   ALT 29  --   ALKPHOS 81  --   BILITOT 0.6  --    RADIOLOGY:  Ct Head Wo Contrast  09/17/2015  CLINICAL DATA:  Altered mental status. Seen here a few days ago after falling. EXAM: CT HEAD WITHOUT CONTRAST TECHNIQUE: Contiguous axial images were obtained from the base of the skull through the vertex without intravenous contrast. COMPARISON:  09/15/2015 FINDINGS: There is no intracranial hemorrhage, mass or evidence of acute infarction. There is encephalomalacia due to remote infarction in the right temporal and right frontal regions. This is unchanged from 07/14/2014. There is moderate generalized atrophy. There is white matter hypodensity, chronic and likely due to small vessel ischemic disease.  No significant extra-axial fluid collection. No bony abnormality. The visible paranasal sinuses are clear. IMPRESSION: No acute intracranial findings.  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 There is moderate generalized atrophy and chronic small vessel disease. Electronically Signed   By: Ellery Plunkaniel R Mitchell M.D.   On: 09/17/2015 21:08   Dg Chest Port 1 View  09/17/2015  CLINICAL DATA:  Altered mental status EXAM: PORTABLE CHEST 1 VIEW COMPARISON:  09/15/2015 FINDINGS: The heart size and mediastinal contours are within normal limits. Both lungs are clear. The visualized skeletal structures are unremarkable. IMPRESSION: No active disease. Electronically Signed   By: Alcide CleverMark  Lukens M.D.   On: 09/17/2015 21:09   ASSESSMENT AND PLAN:  82 year old the female patient with history of chronic systolic heart failure, hypertension, chronic atrial fibrillation, Parkinson's disease presented to the emergency room with confusion and has hallucinations.  1. Altered mental status: likely multifactorial, possible due to mild urinary tract infection although doubt it. 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 Will get outpt neuro eval  2. Urinary tract infection: continue rocephin, await urine c/s  3. Hypokalemia: replete and recheck. Check mg   4. Chronic atrial fibrillation: on eliquis, follows with Dr Mariah MillingGollan  5. Chronic systolic heart failure: well compensated at this time  6. Parkinson's disease: continue amantadin, outpt neuro f/up  7. Weakness: Get PT eval, currently at [facility])  8. Dehydration: stop IVF after current IV bag, has h/o CHF.   All the records are reviewed and case discussed with Care Management/Social Worker. Management plans discussed with the patient, family and they are in agreement.  CODE STATUS: DNR  TOTAL TIME TAKING CARE OF THIS PATIENT: 35 minutes.   More than 50% of the time was spent in counseling/coordination of care: YES (sister at bedside)  POSSIBLE D/C IN AM, DEPENDING ON CLINICAL CONDITION. MRI findings and  PT eval   [facility], Jeyden Coffelt M.D on 09/18/2015 at 9:15 AM  Between 7am to 6pm - Pager - [phone]  After 6pm go to [email or web address] - password [email or web address]  Fabio Neighborsagle  Hospitalists  Office  [phone]  CC: Primary care physician; Pcp Not In System  Note: This dictation was prepared with Dragon dictation along with smaller phrase technology. Any transcriptional errors that result from this process are unintentional.

## 2015-09-18 NOTE — Evaluation (Signed)
Physical Therapy Evaluation Patient Details Name: Anita BubaMartha Kirk MRN: [id number] DOB: 09-07-1928 Today's Date: 09/18/2015   History of Present Illness  [name] is a 82 y.o. female with a known history of chronic atrial fibrillation, chronic systolic heart failure EF of 45%, hypertension, dementia, arthritis, hyperlipidemia, Parkinson's disease presented to the emergency room because of confusion. Patient was brought to the emergency room last Thursday by the family she was worked up with a CT head and was sent home. She appeared to be disoriented for the last few days and was also hallucinating. Patient was talking to dead people according to the daughter. She also fell out of bed last Thursday. No history of any head injury. Patient was again evaluated today was found to have urinary tract infection. She is mostly wheelchair bound however family reports that up until 1 month ago she was assisting with transfers and even performed very limited ambulation with physical therapy. Pt is currently a resident of [facility] facility. She moved there February 1st, 2017 from [facility] where she was a resident for approximately 17 years. Pt is AOx1 at time of PT evaluation and history provided by sister via telephone. Sister reports only one fall in the last 12 months. Pt has been working with physical therapy recently to maintain strength for bed mobility and transfers  Clinical Impression  Pt presents from ALF and is significantly limited at baseline. Pt is confused during evaluation and is AOx1. Sister provides supplementary history. At baseline pt is AOx3 per sister. Prior to admission pt was able to provide limited assist with transfers however has been declining over the last month. She is receiving HH PT at facility to prevent functional decline and promote increased assist with transfers. Pt is very confused at this time and will only participate in limited capacity. She requires maxA+1 for bed mobility and  requires heavy UE assist to maintain sitting balance. Without UE support pt falls immediately posterior and to the R. Pt with bilateral plantarflexor contractures indicating prolonged bedrest and limited standing weight acceptance. Unable/unwilling to attempt transfers or ambulation on this date. Pt can return to ALF and resume HH PT per family and patient's wishes to prevent decline and decrease caregiver burden. Pt will benefit from skilled PT services to address deficits in strength, balance, and mobility in order to return to full function at home.     Follow Up Recommendations Home health PT;Other (comment) (Return to ALF and resume HH PT for bed mobility/transfers)    Equipment Recommendations  None recommended by PT    Recommendations for Other Services       Precautions / Restrictions Precautions Precautions: Fall Restrictions Weight Bearing Restrictions: No      Mobility  Bed Mobility Overal bed mobility: Needs Assistance Bed Mobility: Supine to Sit;Sit to Supine     Supine to sit: Max assist Sit to supine: Max assist   General bed mobility comments: Pt with severe weakness, poor command follow. Requires MaxA+1 for bed mobility. Once upright requires heavy UE support to remain upright. Unable to maintain sitting balance without UE support  Transfers                 General transfer comment: Pt refuses to attempt transfers at this time. Unlikely to be able to perform given poor sitting balance and weakness noted at this time. Limited due to cognition  Ambulation/Gait                Stairs  Wheelchair Mobility    Modified Rankin (Stroke Patients Only)       Balance Overall balance assessment: Needs assistance Sitting-balance support: Bilateral upper extremity supported Sitting balance-Leahy Scale: Poor Sitting balance - Comments: Requires UE assist to remain upright. With UE support removed pt immediately falls posterior and to the  R                                     Pertinent Vitals/Pain Pain Assessment: No/denies pain Faces Pain Scale: No hurt Pain Intervention(s): Monitored during session    Home Living Family/patient expects to be discharged to:: Assisted living               Home Equipment: Walker - 4 wheels;Wheelchair - manual Additional Comments: Family expects pt to return back to [facility]    Prior Function Level of Independence: Needs assistance   Gait / Transfers Assistance Needed: Pt able to provide limited assistance with transfers. Has been declining over the last month  ADL's / Homemaking Assistance Needed: Needs assist for bathing/dressing  Comments: Utilized wheelchair for community mobility, and assist for all transfers.     Hand Dominance        Extremity/Trunk Assessment   Upper Extremity Assessment: Generalized weakness;RUE deficits/detail RUE Deficits / Details: Severe bilateral shoulder flexion weakness and limited ROM. Bilateral UE tremors noted         Lower Extremity Assessment: Generalized weakness;RLE deficits/detail RLE Deficits / Details: Bilateral plantarflexor contractures noted with poor AROM DF. Pt able to perform partial SLR without assist but is very weak. Poor command follow due to confusion. Able to perform LAQ against gravity in sitting but unable to follow commands for MMT       Communication   Communication: Other (comment) (Difficult to assess due to confusion)  Cognition Arousal/Alertness: Lethargic Behavior During Therapy: Anxious Overall Cognitive Status: Impaired/Different from baseline Area of Impairment: Orientation Orientation Level: Place;Time;Situation (Pt is AOx1, Sister reports she is typically AOx3)   Memory: Decreased short-term memory              General Comments      Exercises        Assessment/Plan    PT Assessment Patient needs continued PT services  PT Diagnosis Difficulty  walking;Generalized weakness;Altered mental status   PT Problem List Decreased strength;Decreased activity tolerance;Decreased balance;Decreased mobility;Decreased cognition  PT Treatment Interventions DME instruction;Gait training;Functional mobility training;Therapeutic activities;Therapeutic exercise;Balance training;Neuromuscular re-education;Patient/family education;Cognitive remediation;Wheelchair mobility training   PT Goals (Current goals can be found in the Care Plan section) Acute Rehab PT Goals Patient Stated Goal: Pt unable to participate. Sister would like pt to maintain strength for bed mobility/transfers PT Goal Formulation: With family Time For Goal Achievement: 10/02/15 Potential to Achieve Goals: Poor    Frequency Min 2X/week   Barriers to discharge        Co-evaluation               End of Session   Activity Tolerance: Other (comment) (Limited by confusion) Patient left: in bed;with call bell/phone within reach;with bed alarm set;with family/visitor present Nurse Communication: Mobility status;Other (comment) (DC recommendations)         Time: 8119-1478 PT Time Calculation (min) (ACUTE ONLY): 33 min   Charges:   PT Evaluation $PT Eval Moderate Complexity: 1 Procedure     PT G Codes:       Lynnea Maizes PT, DPT  Jakera Beaupre 09/18/2015, 12:12 PM

## 2015-09-18 NOTE — Progress Notes (Addendum)
[facility]CHARGE BACK TO FACILITY 3/21. PT WILL HAVE MRI TO F/U TREMORS. RN TO WEAN OFF O2 . DISCONTINUE CONTINUOUS PULSE OX. STOP IVF

## 2015-09-18 NOTE — NC FL2 (Signed)
[place] MEDICAID FL2 LEVEL OF CARE SCREENING TOOL     IDENTIFICATION  Patient Name: [name] Birthdate: 1929-01-14 Sex: female Admission Date (Current Location): 09/17/2015  [facility] and [place] Number:  [address]  ([id number]) Facility and Address:  [facility], [address] [place], [place] [address]      Provider Number: [id number]  Attending Physician Name and Address:  Delfino Lovett, MD  Relative Name and Phone Number:       Current Level of Care: Hospital Recommended Level of Care: Assisted Living Facility Prior Approval Number:    Date Approved/Denied:   PASRR Number:  ( [id number] )  Discharge Plan: Domiciliary (Rest home)    Current Diagnoses: Patient Active Problem List   Diagnosis Date Noted  . Altered mental state 09/18/2015  . UTI (lower urinary tract infection) 09/18/2015  . Pressure ulcer 09/18/2015  . Acute suppurative parotitis   . Atrial fibrillation with RVR (HCC)   . Hypokalemia   . Sepsis (HCC)   . Morbid obesity due to excess calories (HCC)   . Submandibular gland infection 08/13/2015  . Bilateral leg edema 10/24/2014  . Chronic atrial fibrillation (HCC) 08/23/2014  . Chronic systolic CHF (congestive heart failure) (HCC)   . Persistent atrial fibrillation (HCC)   . HTN (hypertension)   . Cognitive impairment   . Gait instability   . Depression   . Panic attacks     Orientation RESPIRATION BLADDER Height & Weight     Self, Place  Normal Incontinent Weight: 153 lb (69.4 kg) Height:   (162.6 cm)  BEHAVIORAL SYMPTOMS/MOOD NEUROLOGICAL BOWEL NUTRITION STATUS   (none )  (none ) Incontinent Diet (Diet: 2 Gram Sodium. )  AMBULATORY STATUS COMMUNICATION OF NEEDS Skin   Extensive Assist (Wheel Chair Bound ) Verbally PU Stage and Appropriate Care (Pressure Ulcer Stage 2: Left Buttocks & Sacrum. )                       Personal Care Assistance Level of Assistance  Bathing, Feeding, Dressing Bathing  Assistance: Limited assistance Feeding assistance: Limited assistance Dressing Assistance: Limited assistance     Functional Limitations Info  Sight, Hearing, Speech Sight Info: Adequate Hearing Info: Adequate Speech Info: Adequate    SPECIAL CARE FACTORS FREQUENCY    Home Health PT and RN                     Contractures      Additional Factors Info  Code Status, Allergies, Isolation Precautions Code Status Info:  (DNR ) Allergies Info:  (Aliskiren-amlodipine-hctz, Sulfa Antibiotics)     Isolation Precautions Info:  (MRSA Nasal Swab. )     Current Medications (09/18/2015):  This is the current hospital active medication list Current Facility-Administered Medications  Medication Dose Route Frequency Provider Last Rate Last Dose  . 0.9 %  sodium chloride infusion   Intravenous Continuous Ihor Austin, MD 75 mL/hr at 09/18/15 0354    . acetaminophen (TYLENOL) tablet 650 mg  650 mg Oral Q6H PRN Ihor Austin, MD      . amantadine (SYMMETREL) capsule 100 mg  100 mg Oral BID Ihor Austin, MD   100 mg at 09/18/15 0942  . apixaban (ELIQUIS) tablet 5 mg  5 mg Oral BID Ihor Austin, MD   5 mg at 09/18/15 0941  . cefTRIAXone (ROCEPHIN) 1 g in dextrose 5 % 50 mL IVPB  1 g Intravenous Q24H Robley Fries, MD      .  docusate sodium (COLACE) capsule 100 mg  100 mg Oral BID Ihor AustinPavan Pyreddy, MD   100 mg at 09/18/15 0941  . guaiFENesin (ROBITUSSIN) 100 MG/5ML solution 200 mg  200 mg Oral Q6H PRN Pavan Pyreddy, MD      . ipratropium-albuterol (DUONEB) 0.5-2.5 (3) MG/3ML nebulizer solution 3 mL  3 mL Nebulization Q6H PRN Pavan Pyreddy, MD      . levothyroxine (SYNTHROID, LEVOTHROID) tablet 50 mcg  50 mcg Oral QAC breakfast Ihor AustinPavan Pyreddy, MD   50 mcg at 09/18/15 0947  . magnesium hydroxide (MILK OF MAGNESIA) suspension 5 mL  5 mL Oral QID PRN Ihor AustinPavan Pyreddy, MD      . metoprolol (LOPRESSOR) tablet 50 mg  50 mg Oral BID Ihor AustinPavan Pyreddy, MD   50 mg at 09/18/15 0942  . montelukast (SINGULAIR)  tablet 10 mg  10 mg Oral QHS Pavan Pyreddy, MD      . multivitamin with minerals tablet 1 tablet  1 tablet Oral Daily Ihor AustinPavan Pyreddy, MD   1 tablet at 09/18/15 0941  . mupirocin ointment (BACTROBAN) 2 % 1 application  1 application Nasal BID Ihor AustinPavan Pyreddy, MD   1 application at 09/18/15 0954  . nystatin (MYCOSTATIN) 100000 UNIT/ML suspension 500,000 Units  5 mL Oral QID Ihor AustinPavan Pyreddy, MD   500,000 Units at 09/18/15 0941  . ondansetron (ZOFRAN) tablet 4 mg  4 mg Oral Q6H PRN Ihor AustinPavan Pyreddy, MD       Or  . ondansetron (ZOFRAN) injection 4 mg  4 mg Intravenous Q6H PRN Pavan Pyreddy, MD      . PARoxetine (PAXIL) tablet 20 mg  20 mg Oral BH-q7a Ihor AustinPavan Pyreddy, MD   20 mg at 09/18/15 0950  . sodium chloride flush (NS) 0.9 % injection 3 mL  3 mL Intravenous Q12H Ihor AustinPavan Pyreddy, MD         Discharge Medications: Please see discharge summary for a list of discharge medications.  Relevant Imaging Results:  Relevant Lab Results:   Additional Information  (SSN: [id number])  Haig ProphetMorgan, Yannick Steuber G, LCSW

## 2015-09-18 NOTE — H&P (Signed)
[facility] - [place] at [facility]   PATIENT NAME: [name]    MR#:  [id number]  DATE OF BIRTH:  12-19-1928  DATE OF ADMISSION:  09/17/2015  PRIMARY CARE PHYSICIAN: Pcp Not In System   REQUESTING/REFERRING PHYSICIAN:   CHIEF COMPLAINT:   Chief Complaint  Patient presents with  . Altered Mental Status    HISTORY OF PRESENT ILLNESS: [name]  is a 82 y.o. female with a known history of chronic atrial fibrillation, chronic systolic heart failure EF of 45%, hypertension, dementia, arthritis, hyperlipidemia, Parkinson's disease presented to the emergency room because of confusion. Patient was brought to the emergency room last Thursday by the family she was worked up with a CT head and was sent home. She appeared to be disoriented for the last few days and was also hallucinating. Patient was talking to dead people according to the daughter. She also fell out of bed last Thursday. No history of any head injury. Patient was again evaluated today was found to have urinary tract infection. She is mostly wheelchair bound and currently a resident of [place] facility. Patient is awake and alert in the emergency room in response to verbal commands. She is oriented to place and person. Has some chills but no fever. No history of any chest pain and shortness of breath.  PAST MEDICAL HISTORY:   Past Medical History  Diagnosis Date  . Persistent atrial fibrillation (HCC)     a. Eliquis 5 mg bid; b. CHADS2VASc = 5 (CHF, HTN, age x 2, female)  . Chronic systolic CHF (congestive heart failure) (HCC)     a. echo 07/2014: EF 45-50%, dilated LA at 4.5 cm, mildly dilated RA, mod-sev MR, mod TR, mildly elevated PASP @ 45.2  . HTN (hypertension)   . Cognitive impairment   . Gait instability   . Depression   . Panic attacks   . Arthritis   . Hyperlipidemia   . Parkinson's disease (HCC)   . Anxiety disorder     PAST SURGICAL HISTORY: Past Surgical History  Procedure  Laterality Date  . Eye surgery      SOCIAL HISTORY:  Social History  Substance Use Topics  . Smoking status: Never Smoker   . Smokeless tobacco: Not on file  . Alcohol Use: No    FAMILY HISTORY:  Family History  Problem Relation Age of Onset  . Heart disease Sister     DRUG ALLERGIES:  Allergies  Allergen Reactions  . Aliskiren-Amlodipine-Hctz Other (See Comments)  . Sulfa Antibiotics Swelling    REVIEW OF SYSTEMS:   CONSTITUTIONAL: No fever, has weakness.  EYES: No blurred or double vision.  EARS, NOSE, AND THROAT: No tinnitus or ear pain.  RESPIRATORY: No cough, shortness of breath, wheezing or hemoptysis.  CARDIOVASCULAR: No chest pain, orthopnea, edema.  GASTROINTESTINAL: No nausea, vomiting, diarrhea or abdominal pain.  GENITOURINARY: Has dysuria, no hematuria.  ENDOCRINE: No polyuria, nocturia,  HEMATOLOGY: No anemia, easy bruising or bleeding SKIN: No rash or lesion. MUSCULOSKELETAL: No joint pain or arthritis.   NEUROLOGIC: No tingling, numbness, weakness.  PSYCHIATRY: No anxiety or depression. Has hallucinations.  MEDICATIONS AT HOME:  Prior to Admission medications   Medication Sig Start Date End Date Taking? Authorizing Provider  acetaminophen (TYLENOL) 325 MG tablet Take 650 mg by mouth every 6 (six) hours as needed for mild pain, moderate pain or fever.   Yes Historical Provider, MD  Amantadine HCl 100 MG tablet Take 100 mg by mouth 2 (two)  times daily.  06/27/14  Yes Historical Provider, MD  apixaban (ELIQUIS) 5 MG TABS tablet Take 1 tablet (5 mg total) by mouth 2 (two) times daily. 08/16/15  Yes Adrian Saran, MD  docusate sodium (COLACE) 100 MG capsule Take 100 mg by mouth 2 (two) times daily.   Yes Historical Provider, MD  fexofenadine (ALLEGRA) 180 MG tablet Take 180 mg by mouth daily.   Yes Historical Provider, MD  furosemide (LASIX) 20 MG tablet Take 20 mg by mouth 2 (two) times daily.  07/25/14  Yes Historical Provider, MD  guaiFENesin (ROBITUSSIN)  100 MG/5ML liquid Take 200 mg by mouth every 6 (six) hours as needed for cough.   Yes Historical Provider, MD  ipratropium-albuterol (DUONEB) 0.5-2.5 (3) MG/3ML SOLN Take 3 mLs by nebulization every 6 (six) hours as needed (for shortness of breath/wheezing.).    Yes Historical Provider, MD  levothyroxine (SYNTHROID, LEVOTHROID) 50 MCG tablet Take 50 mcg by mouth daily after breakfast.  07/18/14  Yes Historical Provider, MD  magnesium hydroxide (MILK OF MAGNESIA) 400 MG/5ML suspension Take 5 mLs by mouth 4 (four) times daily as needed for mild constipation or moderate constipation.   Yes Historical Provider, MD  metoprolol (LOPRESSOR) 50 MG tablet Take 50 mg by mouth 2 (two) times daily.  07/18/14  Yes Historical Provider, MD  montelukast (SINGULAIR) 10 MG tablet Take 10 mg by mouth at bedtime.    Yes Historical Provider, MD  Multiple Vitamin (MULTIVITAMIN) tablet Take 1 tablet by mouth daily.   Yes Historical Provider, MD  mupirocin ointment (BACTROBAN) 2 % Place 1 application into the nose 2 (two) times daily. 08/16/15  Yes Adrian Saran, MD  nystatin (MYCOSTATIN) 100000 UNIT/ML suspension Take 5 mLs (500,000 Units total) by mouth 4 (four) times daily. 08/16/15  Yes Sital Mody, MD  PARoxetine (PAXIL) 20 MG tablet Take 20 mg by mouth every morning.    Yes Historical Provider, MD  potassium chloride SA (K-DUR,KLOR-CON) 20 MEQ tablet Take 20 mEq by mouth daily.  07/15/14  Yes Historical Provider, MD  clindamycin (CLEOCIN) 300 MG capsule Take 1 capsule (300 mg total) by mouth 3 (three) times daily. Patient not taking: Reported on 09/01/2015 08/16/15   Adrian Saran, MD  predniSONE (STERAPRED UNI-PAK 21 TAB) 10 MG (21) TBPK tablet Take 1 tablet (10 mg total) by mouth daily. Patient not taking: Reported on 09/01/2015 08/16/15   Adrian Saran, MD      PHYSICAL EXAMINATION:   VITAL SIGNS: Blood pressure 102/87, pulse 86, temperature 98.4 F (36.9 C), temperature source Oral, resp. rate 17, height  (1.626 m), weight  81.647 kg (180 lb), SpO2 100 %.  GENERAL:  82 y.o.-year-old patient lying in the bed with no acute distress.  EYES: Pupils equal, round, reactive to light and accommodation. No scleral icterus. Extraocular muscles intact.  HEENT: Head atraumatic, normocephalic. Oropharynx dry and nasopharynx clear.  NECK:  Supple, no jugular venous distention. No thyroid enlargement, no tenderness.  LUNGS: Normal breath sounds bilaterally, no wheezing, rales,rhonchi or crepitation. No use of accessory muscles of respiration.  CARDIOVASCULAR: S1, S2 normal. No murmurs, rubs, or gallops.  ABDOMEN: Soft, nontender, nondistended. Bowel sounds present. No organomegaly or mass.  EXTREMITIES: No pedal edema, cyanosis, or clubbing.  NEUROLOGIC: Cranial nerves II through XII are intact. Muscle strength 5/5 in all extremities. Sensation intact. Gait not checked. No cerebellar signs noted. PSYCHIATRIC: The patient is alert and oriented x 2 SKIN: No obvious rash, lesion, or ulcer.   LABORATORY PANEL:  CBC  Recent Labs Lab 09/15/15 0454 09/17/15 2128  WBC 6.0 6.9  HGB 11.1* 10.6*  HCT 33.0* 31.6*  PLT 211 220  MCV 100.5* 101.4*  MCH 33.7 34.0  MCHC 33.5 33.5  RDW 14.8* 15.0*  LYMPHSABS 1.1  --   MONOABS 1.0*  --   EOSABS 0.1  --   BASOSABS 0.0  --    ------------------------------------------------------------------------------------------------------------------  Chemistries   Recent Labs Lab 09/15/15 0454 09/17/15 2128  NA 141 144  K 3.4* 3.2*  CL 102 107  CO2 32 33*  GLUCOSE 91 120*  BUN 17 22*  CREATININE 1.16* 1.44*  CALCIUM 9.0 9.1  AST 28 33  ALT 26 29  ALKPHOS 69 81  BILITOT 1.0 0.6   ------------------------------------------------------------------------------------------------------------------ estimated creatinine clearance is 29.5 mL/min (by C-G formula based on Cr of  1.44). ------------------------------------------------------------------------------------------------------------------ No results for input(s): TSH, T4TOTAL, T3FREE, THYROIDAB in the last 72 hours.  Invalid input(s): FREET3   Coagulation profile  Recent Labs Lab 09/17/15 2128  INR 1.65   ------------------------------------------------------------------------------------------------------------------- No results for input(s): DDIMER in the last 72 hours. -------------------------------------------------------------------------------------------------------------------  Cardiac Enzymes  Recent Labs Lab 09/15/15 0454 09/17/15 2128  TROPONINI <0.03 0.04*   ------------------------------------------------------------------------------------------------------------------ Invalid input(s): POCBNP  ---------------------------------------------------------------------------------------------------------------  Urinalysis    Component Value Date/Time   COLORURINE YELLOW* 09/17/2015 2251   COLORURINE Yellow 07/14/2014 2215   APPEARANCEUR CLEAR* 09/17/2015 2251   APPEARANCEUR Clear 07/14/2014 2215   LABSPEC 1.013 09/17/2015 2251   LABSPEC 1.005 07/14/2014 2215   PHURINE 6.0 09/17/2015 2251   PHURINE 6.0 07/14/2014 2215   GLUCOSEU NEGATIVE 09/17/2015 2251   GLUCOSEU Negative 07/14/2014 2215   HGBUR NEGATIVE 09/17/2015 2251   HGBUR Negative 07/14/2014 2215   BILIRUBINUR NEGATIVE 09/17/2015 2251   BILIRUBINUR Negative 07/14/2014 2215   KETONESUR NEGATIVE 09/17/2015 2251   KETONESUR Negative 07/14/2014 2215   PROTEINUR NEGATIVE 09/17/2015 2251   PROTEINUR Negative 07/14/2014 2215   NITRITE POSITIVE* 09/17/2015 2251   NITRITE Negative 07/14/2014 2215   LEUKOCYTESUR TRACE* 09/17/2015 2251   LEUKOCYTESUR Negative 07/14/2014 2215     RADIOLOGY: Ct Head Wo Contrast  09/17/2015  CLINICAL DATA:  Altered mental status. Seen here a few days ago after falling. EXAM: CT HEAD  WITHOUT CONTRAST TECHNIQUE: Contiguous axial images were obtained from the base of the skull through the vertex without intravenous contrast. COMPARISON:  09/15/2015 FINDINGS: There is no intracranial hemorrhage, mass or evidence of acute infarction. There is encephalomalacia due to remote infarction in the right temporal and right frontal regions. This is unchanged from 07/14/2014. There is moderate generalized atrophy. There is white matter hypodensity, chronic and likely due to small vessel ischemic disease. No significant extra-axial fluid collection. No bony abnormality. The visible paranasal sinuses are clear. IMPRESSION: No acute intracranial findings. 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 There is moderate generalized atrophy and chronic small vessel disease. Electronically Signed   By: Ellery Plunkaniel R Mitchell M.D.   On: 09/17/2015 21:08   Dg Chest Port 1 View  09/17/2015  CLINICAL DATA:  Altered mental status EXAM: PORTABLE CHEST 1 VIEW COMPARISON:  09/15/2015 FINDINGS: The heart size and mediastinal contours are within normal limits. Both lungs are clear. The visualized skeletal structures are unremarkable. IMPRESSION: No active disease. Electronically Signed   By: Alcide CleverMark  Lukens M.D.   On: 09/17/2015 21:09    EKG: Orders placed or performed during the hospital encounter of 09/17/15  . EKG 12-Lead  . EKG 12-Lead  . EKG 12-Lead  . EKG  12-Lead  . ED EKG  . ED EKG    IMPRESSION AND PLAN: 82 year old the female patient with history of chronic systolic heart failure, hypertension, chronic atrial fibrillation, Parkinson's disease presented to the emergency room with confusion and has hallucinations. Admitting diagnosis 1. Altered mental status secondary to urinary tract infection 2. Urinary tract infection 3. Dehydration 4. Chronic atrial fibrillation 5. Chronic systolic heart failure 6. Parkinson's disease Treatment plan Admit patient to medical floor IV fluid  hydration Start patient on IV Rocephin antibiotic 1 g daily Continue oral Eliquis for anticoagulation Hold diuretics for now Follow-up urine culture Supportive care.  All the records are reviewed and case discussed with ED provider. Management plans discussed with the patient, family and they are in agreement.  CODE STATUS:FULL Code Status History    Date Active Date Inactive Code Status Order ID Comments User Context   08/13/2015 12:27 PM 08/16/2015  5:54 PM DNR [id number]  Milagros Loll, MD ED    Questions for Most Recent Historical Code Status (Order [id number])    Question Answer Comment   In the event of cardiac or respiratory ARREST Do not call a "code blue"    In the event of cardiac or respiratory ARREST Do not perform Intubation, CPR, defibrillation or ACLS    In the event of cardiac or respiratory ARREST Use medication by any route, position, wound care, and other measures to relive pain and suffering. May use oxygen, suction and manual treatment of airway obstruction as needed for comfort.        TOTAL TIME TAKING CARE OF THIS PATIENT: 53 minutes.    Ihor Austin M.D on 09/18/2015 at 1:36 AM  Between 7am to 6pm - Pager - [phone]  After 6pm go to [email or web address] - password [email or web address]  [facility]  Office  [phone]  CC: Primary care physician; Pcp Not In System

## 2015-09-18 NOTE — Progress Notes (Signed)
ANTIBIOTIC CONSULT NOTE - INITIAL  Pharmacy Consult for Ceftriaxone  Indication: UTI  Allergies  Allergen Reactions  . Aliskiren-Amlodipine-Hctz Other (See Comments)  . Sulfa Antibiotics Swelling    Patient Measurements: Height: 5\' 4"  (162.6 cm) Weight: 180 lb (81.647 kg) IBW/kg (Calculated) : 54.7 Adjusted Body Weight:   Vital Signs: Temp: 98 F (36.7 C) (03/20 0333) Temp Source: Oral (03/20 0333) BP: 106/64 mmHg (03/20 0322) Pulse Rate: 94 (03/20 0322) Intake/Output from previous day:   Intake/Output from this shift:    Labs:  Recent Labs  09/15/15 0454 09/17/15 2128  WBC 6.0 6.9  HGB 11.1* 10.6*  PLT 211 220  CREATININE 1.16* 1.44*   Estimated Creatinine Clearance: 29.5 mL/min (by C-G formula based on Cr of 1.44). No results for input(s): VANCOTROUGH, VANCOPEAK, VANCORANDOM, GENTTROUGH, GENTPEAK, GENTRANDOM, TOBRATROUGH, TOBRAPEAK, TOBRARND, AMIKACINPEAK, AMIKACINTROU, AMIKACIN in the last 72 hours.   Microbiology: No results found for this or any previous visit (from the past 720 hour(s)).  Medical History: Past Medical History  Diagnosis Date  . Persistent atrial fibrillation (HCC)     a. Eliquis 5 mg bid; b. CHADS2VASc = 5 (CHF, HTN, age x 2, female)  . Chronic systolic CHF (congestive heart failure) (HCC)     a. echo 07/2014: EF 45-50%, dilated LA at 4.5 cm, mildly dilated RA, mod-sev MR, mod TR, mildly elevated PASP @ 45.2  . HTN (hypertension)   . Cognitive impairment   . Gait instability   . Depression   . Panic attacks   . Arthritis   . Hyperlipidemia   . Parkinson's disease (HCC)   . Anxiety disorder     Medications:  Prescriptions prior to admission  Medication Sig Dispense Refill Last Dose  . acetaminophen (TYLENOL) 325 MG tablet Take 650 mg by mouth every 6 (six) hours as needed for mild pain, moderate pain or fever.   PRN at PRN  . Amantadine HCl 100 MG tablet Take 100 mg by mouth 2 (two) times daily.    09/17/2015 at Unknown time  .  apixaban (ELIQUIS) 5 MG TABS tablet Take 1 tablet (5 mg total) by mouth 2 (two) times daily. 60 tablet 0 09/17/2015 at 2000  . docusate sodium (COLACE) 100 MG capsule Take 100 mg by mouth 2 (two) times daily.   09/17/2015 at Unknown time  . fexofenadine (ALLEGRA) 180 MG tablet Take 180 mg by mouth daily.   09/17/2015 at Unknown time  . furosemide (LASIX) 20 MG tablet Take 20 mg by mouth 2 (two) times daily.    09/17/2015 at Unknown time  . guaiFENesin (ROBITUSSIN) 100 MG/5ML liquid Take 200 mg by mouth every 6 (six) hours as needed for cough.   PRN at PRN  . ipratropium-albuterol (DUONEB) 0.5-2.5 (3) MG/3ML SOLN Take 3 mLs by nebulization every 6 (six) hours as needed (for shortness of breath/wheezing.).    09/17/2015 at Unknown time  . levothyroxine (SYNTHROID, LEVOTHROID) 50 MCG tablet Take 50 mcg by mouth daily after breakfast.    09/17/2015 at Unknown time  . magnesium hydroxide (MILK OF MAGNESIA) 400 MG/5ML suspension Take 5 mLs by mouth 4 (four) times daily as needed for mild constipation or moderate constipation.   PRN at PRN  . metoprolol (LOPRESSOR) 50 MG tablet Take 50 mg by mouth 2 (two) times daily.    09/17/2015 at 0800  . montelukast (SINGULAIR) 10 MG tablet Take 10 mg by mouth at bedtime.    09/17/2015 at Unknown time  . Multiple Vitamin (MULTIVITAMIN) tablet  Take 1 tablet by mouth daily.   09/17/2015 at Unknown time  . mupirocin ointment (BACTROBAN) 2 % Place 1 application into the nose 2 (two) times daily. 22 g 0 09/17/2015 at Unknown time  . nystatin (MYCOSTATIN) 100000 UNIT/ML suspension Take 5 mLs (500,000 Units total) by mouth 4 (four) times daily. 60 mL 0 09/17/2015 at Unknown time  . PARoxetine (PAXIL) 20 MG tablet Take 20 mg by mouth every morning.    09/17/2015 at Unknown time  . potassium chloride SA (K-DUR,KLOR-CON) 20 MEQ tablet Take 20 mEq by mouth daily.    09/17/2015 at Unknown time  . clindamycin (CLEOCIN) 300 MG capsule Take 1 capsule (300 mg total) by mouth 3 (three) times daily.  (Patient not taking: Reported on 09/01/2015) 30 capsule 0 Completed Course at Unknown time  . predniSONE (STERAPRED UNI-PAK 21 TAB) 10 MG (21) TBPK tablet Take 1 tablet (10 mg total) by mouth daily. (Patient not taking: Reported on 09/01/2015) 21 tablet 0 Completed Course at Unknown time   Assessment: CrCl = 29.5 ml/min   Goal of Therapy:  resolution of infection  Plan:  Expected duration 7 days with resolution of temperature and/or normalization of WBC   Ceftriaxone 1 gm IV X 1 given on 3/20 @ 1:22. Ceftriaxone 1 gm IV Q24H ordered to start on 3/20 @ 18:00.   [name] D 09/18/2015,3:54 AM

## 2015-09-19 ENCOUNTER — Encounter: Payer: Self-pay | Admitting: Student

## 2015-09-19 LAB — POTASSIUM: POTASSIUM: 3.5 mmol/L (ref 3.5–5.1)

## 2015-09-19 LAB — MAGNESIUM: MAGNESIUM: 2 mg/dL (ref 1.7–2.4)

## 2015-09-19 MED ORDER — ENSURE ENLIVE PO LIQD: 237.0000 mL | Freq: Two times a day (BID) | ORAL | Status: DC

## 2015-09-19 MED ORDER — POTASSIUM CHLORIDE CRYS ER 20 MEQ PO TBCR
40.0000 meq | EXTENDED_RELEASE_TABLET | Freq: Once | ORAL | Status: AC
  Administered 2015-09-19: 40 meq via ORAL
  Filled 2015-09-19: qty 2

## 2015-09-19 NOTE — Progress Notes (Signed)
Initial Nutrition Assessment   INTERVENTION:   Meals and Snacks: Cater to patient preferences Medical Food Supplement Therapy: will recommend Ensure Enlive po BID, each supplement provides 350 kcal and 20 grams of protein Coordination of Care: if pt at risk for aspiration, recommend SLP evaulation.   NUTRITION DIAGNOSIS:   Increased nutrient needs related to wound healing as evidenced by estimated needs.  GOAL:   Patient will meet greater than or equal to 90% of their needs  MONITOR:   PO intake, Supplement acceptance, Labs, Weight trends, I & O's  REASON FOR ASSESSMENT:    (Pressure Ulcer)    ASSESSMENT:    Pt admitted with confusion and AMS with hallucinations and disorientation.   Past Medical History  Diagnosis Date  . Persistent atrial fibrillation (HCC)     a. Eliquis 5 mg bid; b. CHADS2VASc = 5 (CHF, HTN, age x 2, female)  . Chronic systolic CHF (congestive heart failure) (HCC)     a. echo 07/2014: EF 45-50%, dilated LA at 4.5 cm, mildly dilated RA, mod-sev MR, mod TR, mildly elevated PASP @ 45.2  . HTN (hypertension)   . Cognitive impairment   . Gait instability   . Depression   . Panic attacks   . Arthritis   . Hyperlipidemia   . Parkinson's disease (HCC)   . Anxiety disorder      Diet Order:  Diet 2 gram sodium Room service appropriate?: Yes; Fluid consistency:: Thin    Current Nutrition: Pt's sister reports pt ate some of lunch but not very much as she ate breakfast late this am. Pt's sister reports pt ate oatmeal this am.   Food/Nutrition-Related History: Pt reports not eating as much as she used to. RD [facility] and per sister has trouble at times with pocketing food. Pt reports liking chocolate Ensure.   Scheduled Medications:  . amantadine  100 mg Oral BID  . apixaban  5 mg Oral BID  . cefTRIAXone (ROCEPHIN)  IV  1 g Intravenous Q24H  . docusate sodium  100 mg Oral BID  . feeding supplement (ENSURE ENLIVE)  237 mL Oral BID  BM  . levothyroxine  50 mcg Oral QAC breakfast  . metoprolol  50 mg Oral BID  . montelukast  10 mg Oral QHS  . multivitamin with minerals  1 tablet Oral Daily  . mupirocin ointment  1 application Nasal BID  . nystatin  5 mL Oral QID  . PARoxetine  20 mg Oral [id number]  . sodium chloride flush  3 mL Intravenous Q12H     Electrolyte/Renal Profile and Glucose Profile:   Recent Labs Lab 09/15/15 0454 09/17/15 2128 09/18/15 0424 09/19/15 1336  NA 141 144 145  --   K 3.4* 3.2* 2.8* 3.5  CL 102 107 111  --   CO2 32 33* 28  --   BUN 17 22* 19  --   CREATININE 1.16* 1.44* 1.21*  --   CALCIUM 9.0 9.1 8.4*  --   MG  --   --  2.0  --   GLUCOSE 91 120* 92  --    Protein Profile:   Recent Labs Lab 09/15/15 0454 09/17/15 2128  ALBUMIN 3.4* 3.3*    Gastrointestinal Profile: Last BM:  09/18/2015   Nutrition-Focused Physical Exam Findings:  Unable to complete Nutrition-Focused physical exam at this time.    Weight Change: Pt reports UBW of 153lbs.   Skin:   (Stage II Pressure UIcer)  Height:   Ht Readings from Last 1 Encounters:  09/18/15  (1.626 m)    Weight:   Wt Readings from Last 1 Encounters:  09/18/15 153 lb (69.4 kg)   Wt Readings from Last 10 Encounters:  09/18/15 153 lb (69.4 kg)  09/15/15 180 lb (81.647 kg)  09/01/15 153 lb (69.4 kg)  08/16/15 155 lb 8 oz (70.534 kg)  06/26/15 150 lb (68.04 kg)  06/07/15 165 lb (74.844 kg)  02/24/15 160 lb (72.576 kg)  10/24/14 160 lb (72.576 kg)  07/26/14 154 lb (69.854 kg)    BMI:  Body mass index is 26.25 kg/(m^2).  Estimated Nutritional Needs:   Kcal:  BEE: 1124kcals, TEE: (IF 1.2-1.4)(AF 1.2) 1618-1880kcals  Protein:  83-104g protein (1.2-1.5g/kg)  Fluid:  1735-2034mL of fluid (25-14mL/kg)  EDUCATION NEEDS:   No education needs identified at this time   MODERATE Care Level  Leda Quail, RD, LDN Pager [phone] Weekend/On-Call Pager [phone]

## 2015-09-19 NOTE — Progress Notes (Signed)
Per MD patient is not medically stable for D/C today and may be ready tomorrow 09/20/15. Clinical Child psychotherapistocial Worker (CSW) contacted Ship brokerTiffany [facility] coordinator [facility] and made her aware of above. CSW contacted patient's sister Kathie RhodesBetty and made her aware of above. CSW will continue to follow and assist as needed.   Jetta LoutBailey Morgan, LCSW [phone]

## 2015-09-19 NOTE — Progress Notes (Signed)
[facility] - [place] at [facility]   PATIENT NAME: [name]    MR#:  [id number]  DATE OF BIRTH:  May 12, 1929  SUBJECTIVE:  CHIEF COMPLAINT:   Chief Complaint  Patient presents with  . Altered Mental Status  Was having a lot more tremors yesterday per her sister, low-grade fever, potassium improving but still low. Had 2.08 pause on tele y'day night  REVIEW OF SYSTEMS:  Review of Systems  Constitutional: Positive for fever and malaise/fatigue. Negative for weight loss and diaphoresis.  HENT: Negative for ear discharge, ear pain, hearing loss, nosebleeds, sore throat and tinnitus.   Eyes: Negative for blurred vision and pain.  Respiratory: Negative for cough, hemoptysis, shortness of breath and wheezing.   Cardiovascular: Negative for chest pain, palpitations, orthopnea and leg swelling.  Gastrointestinal: Negative for heartburn, nausea, vomiting, abdominal pain, diarrhea, constipation and blood in stool.  Genitourinary: Negative for dysuria, urgency and frequency.  Musculoskeletal: Positive for falls. Negative for myalgias and back pain.  Skin: Negative for itching and rash.  Neurological: Positive for tremors and weakness. Negative for dizziness, tingling, focal weakness, seizures and headaches.  Psychiatric/Behavioral: Negative for depression and hallucinations. The patient is not nervous/anxious.    DRUG ALLERGIES:   Allergies  Allergen Reactions  . Aliskiren-Amlodipine-Hctz Other (See Comments)  . Sulfa Antibiotics Swelling   VITALS:  Blood pressure 113/61, pulse 73, temperature 99.1 F (37.3 C), temperature source Oral, resp. rate 19, height  (1.626 m), weight 69.4 kg (153 lb), SpO2 98 %. PHYSICAL EXAMINATION:  Physical Exam  Constitutional: She is oriented to person, place, and time and well-developed, well-nourished, and in no distress.  HENT:  Head: Normocephalic and atraumatic.  Eyes: Conjunctivae and EOM are normal. Pupils are equal,  round, and reactive to light.  Neck: Normal range of motion. Neck supple. No tracheal deviation present. No thyromegaly present.  Cardiovascular: Normal rate, regular rhythm and normal heart sounds.   Pulmonary/Chest: Effort normal and breath sounds normal. No respiratory distress. She has no wheezes. She exhibits no tenderness.  Abdominal: Soft. Bowel sounds are normal. She exhibits no distension. There is no tenderness.  Musculoskeletal: Normal range of motion.  Neurological: She is alert and oriented to person, place, and time. No cranial nerve deficit.  Skin: Skin is warm and dry. No rash noted.  Psychiatric: Mood and affect normal.   LABORATORY PANEL:   CBC  Recent Labs Lab 09/18/15 0424  WBC 6.1  HGB 9.8*  HCT 29.2*  PLT 202   ------------------------------------------------------------------------------------------------------------------ Chemistries   Recent Labs Lab 09/17/15 2128 09/18/15 0424 09/19/15 1336  NA 144 145  --   K 3.2* 2.8* 3.5  CL 107 111  --   CO2 33* 28  --   GLUCOSE 120* 92  --   BUN 22* 19  --   CREATININE 1.44* 1.21*  --   CALCIUM 9.1 8.4*  --   MG  --  2.0  --   AST 33  --   --   ALT 29  --   --   ALKPHOS 81  --   --   BILITOT 0.6  --   --    RADIOLOGY:  No results found. ASSESSMENT AND PLAN:  82 year old the female patient with history of chronic systolic heart failure, hypertension, chronic atrial fibrillation, Parkinson's disease presented to the emergency room with confusion and has hallucinations.  1. Altered mental status: likely multifactorial, possible due to mild urinary tract infection although doubt it. 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 Will get outpt neuro eval  2. Urinary tract infection: continue rocephin, await urine c/s  3. Hypokalemia: replete and recheck. Check mg.  Potassium 3.5 today, give another K-Dur 20 milliequivalents  4. Chronic  atrial fibrillation: on eliquis, follows with Dr Mariah MillingGollan  5. Chronic systolic heart failure: well compensated at this time  6. Parkinson's disease: continue amantadin, outpt neuro f/up - may need further adjustment and medications as she is still having some tremors  7. Weakness: PT -recommends  AL with [facility] health ([facility])  8. Dehydration: stop IVF after current IV bag, has h/o CHF.Can resume diuretics on discharge   All the records are reviewed and case discussed with Care Management/Social Worker. Management plans discussed with the patient, family and they are in agreement.  CODE STATUS: DO NOT RESUSCITATE  TOTAL TIME TAKING CARE OF THIS PATIENT: 35 minutes.   More than 50% of the time was spent in counseling/coordination of care: YES (sister at bedside)  POSSIBLE D/C IN AM, DEPENDING ON CLINICAL CONDITION. Wanting to replete potassium close to 4 due to her cardiac arrhythmias, pauses and tremor also waiting for urine culture   [facility], Jemiah Cuadra M.D on 09/19/2015 at 4:02 PM  Between 7am to 6pm - Pager - [phone]  After 6pm go to [email or web address] - password [email or web address]  [facility]  Office  [phone]  CC: Primary care physician; Pcp Not In System  Note: This dictation was prepared with Dragon dictation along with smaller phrase technology. Any transcriptional errors that result from this process are unintentional.

## 2015-09-19 NOTE — Progress Notes (Signed)
Dr Loney Lohseni notified of 2.08 pause. No new orders received

## 2015-09-19 NOTE — Progress Notes (Signed)
Centralized telemetry notified RN pt had a 2.08 second pause.  Hospitalist  paged

## 2015-09-20 LAB — BASIC METABOLIC PANEL
Anion gap: 3 — ABNORMAL LOW (ref 5–15)
BUN: 13 mg/dL (ref 6–20)
CHLORIDE: 109 mmol/L (ref 101–111)
CO2: 27 mmol/L (ref 22–32)
CREATININE: 1.05 mg/dL — AB (ref 0.44–1.00)
Calcium: 8.4 mg/dL — ABNORMAL LOW (ref 8.9–10.3)
GFR calc Af Amer: 55 mL/min — ABNORMAL LOW (ref >60)
GFR calc non Af Amer: 47 mL/min — ABNORMAL LOW (ref >60)
Glucose, Bld: 81 mg/dL (ref 65–99)
Potassium: 4 mmol/L (ref 3.5–5.1)
SODIUM: 139 mmol/L (ref 135–145)

## 2015-09-20 LAB — URINE CULTURE

## 2015-09-20 NOTE — Discharge Summary (Addendum)
[facility] -  at [facility]   PATIENT NAME: [name]    MR#:  [id number]  DATE OF BIRTH:  07-May-1929  DATE OF ADMISSION:  09/17/2015 ADMITTING PHYSICIAN: Ihor Austin, MD  DATE OF DISCHARGE: 09/20/2015  PRIMARY CARE PHYSICIAN: Lorenda Ishihara MD    ADMISSION DIAGNOSIS:  Dehydration [E86.0] Confusion [R41.0] Acute renal insufficiency [N28.9] Urinary tract infection, acute [N39.0]  DISCHARGE DIAGNOSIS:  Principal Problem:   Altered mental state Active Problems:   UTI (lower urinary tract infection)   Pressure ulcer  SECONDARY DIAGNOSIS:   Past Medical History  Diagnosis Date  . Persistent atrial fibrillation (HCC)     a. Eliquis 5 mg bid; b. CHADS2VASc = 5 (CHF, HTN, age x 2, female)  . Chronic systolic CHF (congestive heart failure) (HCC)     a. echo 07/2014: EF 45-50%, dilated LA at 4.5 cm, mildly dilated RA, mod-sev MR, mod TR, mildly elevated PASP @ 45.2  . HTN (hypertension)   . Cognitive impairment   . Gait instability   . Depression   . Panic attacks   . Arthritis   . Hyperlipidemia   . Parkinson's disease (HCC)   . Anxiety disorder     HOSPITAL COURSE:  82 year old the female patient with history of chronic systolic heart failure, hypertension, chronic atrial fibrillation, Parkinson's disease admitted for confusion and  hallucinations.  1. Altered mental status: likely multifactorial, possible due to mild urinary tract infection. MRI brain neg, more likely progressive parkinson dz or underlying dementia.  2. Urinary tract infection: treated 3. Hypokalemia: replete and resolved 4. Chronic atrial fibrillation: on eliquis, follows with Dr Mariah Milling 5. Chronic systolic heart failure: well compensated at this time 6. Parkinson's disease: continue amantadin, outpt neuro f/up 7. Weakness: going back to [place] with [facility])  Patient seems to be back to her baseline mental status and is being D/C back to her  facility with home health services. Highly recommend eval by neuro as she may need titration of her Parkinson's meds. High risk for readmissions. DISCHARGE CONDITIONS:   stable  CONSULTS OBTAINED:     DRUG ALLERGIES:   Allergies  Allergen Reactions  . Aliskiren-Amlodipine-Hctz Other (See Comments)  . Sulfa Antibiotics Swelling    DISCHARGE MEDICATIONS:   Current Discharge Medication List    CONTINUE these medications which have NOT CHANGED   Details  acetaminophen (TYLENOL) 325 MG tablet Take 650 mg by mouth every 6 (six) hours as needed for mild pain, moderate pain or fever.    Amantadine HCl 100 MG tablet Take 100 mg by mouth 2 (two) times daily.     apixaban (ELIQUIS) 5 MG TABS tablet Take 1 tablet (5 mg total) by mouth 2 (two) times daily. Qty: 60 tablet, Refills: 0    docusate sodium (COLACE) 100 MG capsule Take 100 mg by mouth 2 (two) times daily.    fexofenadine (ALLEGRA) 180 MG tablet Take 180 mg by mouth daily.    furosemide (LASIX) 20 MG tablet Take 20 mg by mouth 2 (two) times daily.     guaiFENesin (ROBITUSSIN) 100 MG/5ML liquid Take 200 mg by mouth every 6 (six) hours as needed for cough.    ipratropium-albuterol (DUONEB) 0.5-2.5 (3) MG/3ML SOLN Take 3 mLs by nebulization every 6 (six) hours as needed (for shortness of breath/wheezing.).     levothyroxine (SYNTHROID, LEVOTHROID) 50 MCG tablet Take 50 mcg by mouth daily after breakfast.     magnesium hydroxide (MILK OF MAGNESIA) 400 MG/5ML suspension  Take 5 mLs by mouth 4 (four) times daily as needed for mild constipation or moderate constipation.    metoprolol (LOPRESSOR) 50 MG tablet Take 50 mg by mouth 2 (two) times daily.     montelukast (SINGULAIR) 10 MG tablet Take 10 mg by mouth at bedtime.     Multiple Vitamin (MULTIVITAMIN) tablet Take 1 tablet by mouth daily.    mupirocin ointment (BACTROBAN) 2 % Place 1 application into the nose 2 (two) times daily. Qty: 22 g, Refills: 0    nystatin  (MYCOSTATIN) 100000 UNIT/ML suspension Take 5 mLs (500,000 Units total) by mouth 4 (four) times daily. Qty: 60 mL, Refills: 0    PARoxetine (PAXIL) 20 MG tablet Take 20 mg by mouth every morning.     potassium chloride SA (K-DUR,KLOR-CON) 20 MEQ tablet Take 20 mEq by mouth daily.       STOP taking these medications     clindamycin (CLEOCIN) 300 MG capsule      predniSONE (STERAPRED UNI-PAK 21 TAB) 10 MG (21) TBPK tablet          DISCHARGE INSTRUCTIONS:    DIET:  Cardiac diet  DISCHARGE CONDITION:  Stable  ACTIVITY:  Activity as tolerated  OXYGEN:  Home Oxygen: No.   Oxygen Delivery: room air  DISCHARGE LOCATION:  Assisted Living with Home Health   If you experience worsening of your admission symptoms, develop shortness of breath, life threatening emergency, suicidal or homicidal thoughts you must seek medical attention immediately by calling 911 or calling your MD immediately  if symptoms less severe.  You Must read complete instructions/literature along with all the possible adverse reactions/side effects for all the Medicines you take and that have been prescribed to you. Take any new Medicines after you have completely understood and accpet all the possible adverse reactions/side effects.   Please note  You were cared for by a hospitalist during your hospital stay. If you have any questions about your discharge medications or the care you received while you were in the hospital after you are discharged, you can call the unit and asked to speak with the hospitalist on call if the hospitalist that took care of you is not available. Once you are discharged, your primary care physician will handle any further medical issues. Please note that NO REFILLS for any discharge medications will be authorized once you are discharged, as it is imperative that you return to your primary care physician (or establish a relationship with a primary care physician if you do not have one)  for your aftercare needs so that they can reassess your need for medications and monitor your lab values.    On the day of Discharge:  VITAL SIGNS:  Blood pressure 106/85, pulse 112, temperature 99 F (37.2 C), temperature source Oral, resp. rate 18, height $R [id number]$5\' 4"HYSICAL EXAMINATION:  GENERAL:  82 y.o.-year-old patient lying in the bed with no acute distress.  EYES: Pupils equal, round, reactive to light and accommodation. No scleral icterus. Extraocular muscles intact.  HEENT: Head atraumatic, normocephalic. Oropharynx and nasopharynx clear.  NECK:  Supple, no jugular venous distention. No thyroid enlargement, no tenderness.  LUNGS: Normal breath sounds bilaterally, no wheezing, rales,rhonchi or crepitation. No use of accessory muscles of respiration.  CARDIOVASCULAR: S1, S2 normal. No murmurs, rubs, or gallops.  ABDOMEN: Soft, non-tender, non-distended. Bowel sounds present. No organomegaly or mass.  EXTREMITIES: No pedal edema, cyanosis, or clubbing.  NEUROLOGIC:  Cranial nerves II through XII are intact. Muscle strength 5/5 in all extremities. Sensation intact. Gait not checked.  PSYCHIATRIC: The patient is alert and oriented x 3.  SKIN: No obvious rash, lesion, or ulcer.  DATA REVIEW:   CBC  Recent Labs Lab 09/18/15 0424  WBC 6.1  HGB 9.8*  HCT 29.2*  PLT 202    Chemistries   Recent Labs Lab 09/17/15 2128  09/18/15 0424 09/19/15 1336 09/19/15 1625  NA 144  --  145  --   --   K 3.2*  --  2.8* 3.5  --   CL 107  --  111  --   --   CO2 33*  --  28  --   --   GLUCOSE 120*  --  92  --   --   BUN 22*  --  19  --   --   CREATININE 1.44*  --  1.21*  --   --   CALCIUM 9.1  --  8.4*  --   --   MG  --   < > 2.0  --  2.0  AST 33  --   --   --   --   ALT 29  --   --   --   --   ALKPHOS 81  --   --   --   --   BILITOT 0.6  --   --   --   --   < > = values in this interval not displayed.  Cardiac Enzymes  Recent Labs Lab  09/17/15 2128  TROPONINI 0.04*    Microbiology Results  Results for orders placed or performed during the hospital encounter of 09/17/15  MRSA PCR Screening     Status: None   Collection Time: 09/18/15  3:47 AM  Result Value Ref Range Status   MRSA by PCR NEGATIVE NEGATIVE Final    Comment:        The GeneXpert MRSA Assay (FDA approved for NASAL specimens only), is one component of a comprehensive MRSA colonization surveillance program. It is not intended to diagnose MRSA infection nor to guide or monitor treatment for MRSA infections.   Urine culture     Status: None (Preliminary result)   Collection Time: 09/18/15  2:54 PM  Result Value Ref Range Status   Specimen Description URINE, RANDOM  Final   Special Requests NONE  Final   Culture HOLDING FOR POSSIBLE PATHOGEN  Final   Report Status PENDING  Incomplete    RADIOLOGY:  Mr Brain Wo Contrast  09/18/2015  CLINICAL DATA:  82 year old hypertensive female with Parkinson's disease and atrial fibrillation presenting with disorientation. Fall last week. No head injury. Subsequent encounter. EXAM: MRI HEAD WITHOUT CONTRAST TECHNIQUE: Multiplanar, multiecho pulse sequences of the brain and surrounding structures were obtained without intravenous contrast. COMPARISON:  09/17/2015 head CT.  No comparison brain MR. FINDINGS: Exam is motion degraded. No acute infarct or intracranial hemorrhage. 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 Prominent chronic small vessel disease. Moderate global atrophy. Ventricular prominence felt to be related to atrophy rather hydrocephalus. No intracranial hemorrhage or obvious mass noted on this motion degraded unenhanced exam. Major intracranial vascular structures appear to be patent. Mild mucosal thickening right maxillary sinus. Post lens replacement otherwise orbital structures unremarkable. Cervical medullary junction and pituitary region unremarkable. IMPRESSION: Exam is  motion degraded. No acute infarct or intracranial hemorrhage. 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 Prominent chronic small  vessel disease. Moderate global atrophy. Electronically Signed   By: Lacy Duverney M.D.   On: 09/18/2015 14:27     Management plans discussed with the patient, family and they are in agreement.  CODE STATUS:     Code Status Orders        Start     Ordered   09/18/15 0340  Do not attempt resuscitation (DNR)   Continuous    Question Answer Comment  In the event of cardiac or respiratory ARREST Do not call a "code blue"   In the event of cardiac or respiratory ARREST Do not perform Intubation, CPR, defibrillation or ACLS   In the event of cardiac or respiratory ARREST Use medication by any route, position, wound care, and other measures to relive pain and suffering. May use oxygen, suction and manual treatment of airway obstruction as needed for comfort.      09/18/15 0339    Code Status History    Date Active Date Inactive Code Status Order ID Comments User Context   08/13/2015 12:27 PM 08/16/2015  5:54 PM DNR [id number]  Milagros Loll, MD ED      TOTAL TIME TAKING CARE OF THIS PATIENT: 45 minutes.    [facility], Labaron Digirolamo M.D on 09/20/2015 at 8:05 AM  Between 7am to 6pm - Pager - [phone]  After 6pm go to [email or web address] - password [email or web address]  [facility]  Office  [phone]  CC: Primary care physician; Lorenda Ishihara MD ([facility])  Note: This dictation was prepared with Dragon dictation along with smaller phrase technology. Any transcriptional errors that result from this process are unintentional.

## 2015-09-20 NOTE — Progress Notes (Signed)
DISCHARGE NOTE:  Discharge packet given to sister at the bedside, ([facility] Pt wheeled out by staff, [facility] here with [facility], pt transported in wheelchair into [facility].

## 2015-09-20 NOTE — Progress Notes (Signed)
Patient is medically stable for [facility]. Per Cristy FolksLaura [facility] Director patient can return today and [facility] will provide transport in their wheel chair Zena[facility] and bring a wheel chair. Clinical Child psychotherapistocial Worker (CSW) faxed D[facility]C Summary and FL[facility] to [facility]. Patient's sister Kathie RhodesBetty is at bedside and aware of above. Please reconsult if future social work needs arise. CSW signing off.   Jetta LoutBailey Morgan, LCSW [phone]

## 2015-09-20 NOTE — Discharge Instructions (Signed)
Confusion Confusion is the inability to think with your usual speed or clarity. Confusion may come on quickly or slowly over time. How quickly the confusion comes on depends on the cause. Confusion can be due to any number of causes. CAUSES   Concussion, head injury, or head trauma.  Seizures.  Stroke.  Fever.  Brain tumor.  Age related decreased brain function (dementia).  Heightened emotional states like rage or terror.  Mental illness in which the person loses the ability to determine what is real and what is not (hallucinations).  Infections such as a urinary tract infection (UTI).  Toxic effects from alcohol, drugs, or prescription medicines.  Dehydration and an imbalance of salts in the body (electrolytes).  Lack of sleep.  Low blood sugar (diabetes).  Low levels of oxygen from conditions such as chronic lung disorders.  Drug interactions or other medicine side effects.  Nutritional deficiencies, especially niacin, thiamine, vitamin C, or vitamin B.  Sudden drop in body temperature (hypothermia).  Change in routine, such as when traveling or hospitalized. SIGNS AND SYMPTOMS  People often describe their thinking as cloudy or unclear when they are confused. Confusion can also include feeling disoriented. That means you are unaware of where or who you are. You may also not know what the date or time is. If confused, you may also have difficulty paying attention, remembering, and making decisions. Some people also act aggressively when they are confused.  DIAGNOSIS  The medical evaluation of confusion may include:  Blood and urine tests.  X-rays.  Brain and nervous system tests.  Analyzing your brain waves (electroencephalogram or EEG).  Magnetic resonance imaging (MRI) of your head.  Computed tomography (CT) scan of your head.  Mental status tests in which your health care provider may ask many questions. Some of these questions may seem silly or strange,  but they are a very important test to help diagnose and treat confusion. TREATMENT  An admission to the hospital may not be needed, but a person with confusion should not be left alone. Stay with a family member or friend until the confusion clears. Avoid alcohol, pain relievers, or sedative drugs until you have fully recovered. Do not drive until directed by your health care provider. HOME CARE INSTRUCTIONS  What family and friends can do:  To find out if someone is confused, ask the person to state his or her name, age, and the date. If the person is unsure or answers incorrectly, he or she is confused.  Always introduce yourself, no matter how well the person knows you.  Often remind the person of his or her location.  Place a calendar and clock near the confused person.  Help the person with his or her medicines. You may want to use a pill box, an alarm as a reminder, or give the person each dose as prescribed.  Talk about current events and plans for the day.  Try to keep the environment calm, quiet, and peaceful.  Make sure the person keeps follow-up visits with his or her health care provider. PREVENTION  Ways to prevent confusion:  Avoid alcohol.  Eat a balanced diet.  Get enough sleep.  Take medicine only as directed by your health care provider.  Do not become isolated. Spend time with other people and make plans for your days.  Keep careful watch on your blood sugar levels if you are diabetic. SEEK IMMEDIATE MEDICAL CARE IF:   You develop severe headaches, repeated vomiting, seizures, blackouts, or   slurred speech.  There is increasing confusion, weakness, numbness, restlessness, or personality changes.  You develop a loss of balance, have marked dizziness, feel uncoordinated, or fall.  You have delusions, hallucinations, or develop severe anxiety.  Your family members think you need to be rechecked.   This information is not intended to replace advice given  to you by your health care provider. Make sure you discuss any questions you have with your health care provider.   Document Released: 07/25/2004 Document Revised: 07/08/2014 Document Reviewed: 07/23/2013 Elsevier Interactive Patient Education 2016 [employer]  

## 2015-09-20 NOTE — NC FL2 (Signed)
[place] MEDICAID FL2 LEVEL OF CARE SCREENING TOOL     IDENTIFICATION  Patient Name: [name] Birthdate: [birth year] Sex: female Admission Date (Current Location): 09/17/2015  [facility] [place] and [place] Number:  [address]  ([id number]) Facility and Address:  [facility], [address] [place], [place] [address]      Provider Number: [id number]  Attending Physician Name and Address:  Delfino Lovett, MD  Relative Name and Phone Number:       Current Level of Care: Hospital Recommended Level of Care: Assisted Living Facility Prior Approval Number:    Date Approved/Denied:   PASRR Number:  ( [id number] )  Discharge Plan: Domiciliary (Rest home)    Current Diagnoses: Patient Active Problem List   Diagnosis Date Noted  . Altered mental state 09/18/2015  . UTI (lower urinary tract infection) 09/18/2015  . Pressure ulcer 09/18/2015  . Acute suppurative parotitis   . Atrial fibrillation with RVR (HCC)   . Hypokalemia   . Sepsis (HCC)   . Morbid obesity due to excess calories (HCC)   . Submandibular gland infection 08/13/2015  . Bilateral leg edema 10/24/2014  . Chronic atrial fibrillation (HCC) 08/23/2014  . Chronic systolic CHF (congestive heart failure) (HCC)   . Persistent atrial fibrillation (HCC)   . HTN (hypertension)   . Cognitive impairment   . Gait instability   . Depression   . Panic attacks     Orientation RESPIRATION BLADDER Height & Weight     Self, Place  Normal Incontinent Weight: 153 lb (69.4 kg) Height:   (162.6 cm)  BEHAVIORAL SYMPTOMS/MOOD NEUROLOGICAL BOWEL NUTRITION STATUS   (none )  (none ) Incontinent Cardiac Diet  AMBULATORY STATUS COMMUNICATION OF NEEDS Skin   Extensive Assist (Wheel Chair Bound ) Verbally PU Stage and Appropriate Care (Pressure Ulcer Stage 2: Left Buttocks & Sacrum. )                       Personal Care Assistance Level of Assistance  Bathing, Feeding, Dressing Bathing Assistance: Limited  assistance Feeding assistance: Limited assistance Dressing Assistance: Limited assistance     Functional Limitations Info  Sight, Hearing, Speech Sight Info: Adequate Hearing Info: Adequate Speech Info: Adequate    SPECIAL CARE FACTORS FREQUENCY   PT, OT and RN home health    PT and OT 2-3 days per week.                 Contractures      Additional Factors Info  Code Status, Allergies, Isolation Precautions Code Status Info:  (DNR ) Allergies Info:  (Aliskiren-amlodipine-hctz, Sulfa Antibiotics)     Isolation Precautions Info:  (MRSA Nasal Swab. )    Discharge Medications: Please see discharge summary for a list of discharge medications. DISCHARGE MEDICATIONS:   Current Discharge Medication List    CONTINUE these medications which have NOT CHANGED   Details  acetaminophen (TYLENOL) 325 MG tablet Take 650 mg by mouth every 6 (six) hours as needed for mild pain, moderate pain or fever.    Amantadine HCl 100 MG tablet Take 100 mg by mouth 2 (two) times daily.     apixaban (ELIQUIS) 5 MG TABS tablet Take 1 tablet (5 mg total) by mouth 2 (two) times daily. Qty: 60 tablet, Refills: 0    docusate sodium (COLACE) 100 MG capsule Take 100 mg by mouth 2 (two) times daily.    fexofenadine (ALLEGRA) 180 MG tablet Take 180 mg by mouth daily.  furosemide (LASIX) 20 MG tablet Take 20 mg by mouth 2 (two) times daily.     guaiFENesin (ROBITUSSIN) 100 MG/5ML liquid Take 200 mg by mouth every 6 (six) hours as needed for cough.    ipratropium-albuterol (DUONEB) 0.5-2.5 (3) MG/3ML SOLN Take 3 mLs by nebulization every 6 (six) hours as needed (for shortness of breath/wheezing.).     levothyroxine (SYNTHROID, LEVOTHROID) 50 MCG tablet Take 50 mcg by mouth daily after breakfast.     magnesium hydroxide (MILK OF MAGNESIA) 400 MG/5ML suspension Take 5 mLs by mouth 4 (four) times daily as needed for mild constipation or moderate constipation.     metoprolol (LOPRESSOR) 50 MG tablet Take 50 mg by mouth 2 (two) times daily.     montelukast (SINGULAIR) 10 MG tablet Take 10 mg by mouth at bedtime.     Multiple Vitamin (MULTIVITAMIN) tablet Take 1 tablet by mouth daily.    mupirocin ointment (BACTROBAN) 2 % Place 1 application into the nose 2 (two) times daily. Qty: 22 g, Refills: 0    nystatin (MYCOSTATIN) 100000 UNIT/ML suspension Take 5 mLs (500,000 Units total) by mouth 4 (four) times daily. Qty: 60 mL, Refills: 0    PARoxetine (PAXIL) 20 MG tablet Take 20 mg by mouth every morning.     potassium chloride SA (K-DUR,KLOR-CON) 20 MEQ tablet Take 20 mEq by mouth daily.            Relevant Imaging Results:  Relevant Lab Results:   Additional Information  (SSN: [id number])  Haig ProphetMorgan, Burr Soffer G, LCSW

## 2015-09-22 LAB — BLOOD GAS, VENOUS
ACID-BASE EXCESS: 8.1 mmol/L — AB (ref 0.0–3.0)
Bicarbonate: 34.9 mEq/L — ABNORMAL HIGH (ref 21.0–28.0)
PATIENT TEMPERATURE: 37
pCO2, Ven: 59 mmHg (ref 44.0–60.0)
pH, Ven: 7.38 (ref 7.320–7.430)
pO2, Ven: <31 mmHg — ABNORMAL LOW (ref 31.0–45.0)

## 2015-09-25 ENCOUNTER — Ambulatory Visit: Payer: Medicare Other | Admitting: Cardiovascular Disease

## 2015-10-02 ENCOUNTER — Emergency Department
Admission: EM | Admit: 2015-10-02 | Discharge: 2015-10-02 | Disposition: A | Discharge status: Home / Self Care | Payer: Medicare Other | Attending: Emergency Medicine | Admitting: Emergency Medicine

## 2015-10-02 ENCOUNTER — Encounter: Payer: Self-pay | Admitting: Emergency Medicine

## 2015-10-02 ENCOUNTER — Emergency Department: Payer: Medicare Other

## 2015-10-02 DIAGNOSIS — Y929 Unspecified place or not applicable: Secondary | ICD-10-CM | POA: Insufficient documentation

## 2015-10-02 DIAGNOSIS — E785 Hyperlipidemia, unspecified: Secondary | ICD-10-CM | POA: Diagnosis not present

## 2015-10-02 DIAGNOSIS — A419 Sepsis, unspecified organism: Secondary | ICD-10-CM | POA: Diagnosis not present

## 2015-10-02 DIAGNOSIS — Z792 Long term (current) use of antibiotics: Secondary | ICD-10-CM | POA: Diagnosis not present

## 2015-10-02 DIAGNOSIS — M1990 Unspecified osteoarthritis, unspecified site: Secondary | ICD-10-CM | POA: Insufficient documentation

## 2015-10-02 DIAGNOSIS — Z79899 Other long term (current) drug therapy: Secondary | ICD-10-CM | POA: Insufficient documentation

## 2015-10-02 DIAGNOSIS — F419 Anxiety disorder, unspecified: Secondary | ICD-10-CM | POA: Diagnosis not present

## 2015-10-02 DIAGNOSIS — R4182 Altered mental status, unspecified: Secondary | ICD-10-CM | POA: Diagnosis present

## 2015-10-02 DIAGNOSIS — G20 Parkinson's disease: Secondary | ICD-10-CM | POA: Diagnosis not present

## 2015-10-02 DIAGNOSIS — I110 Hypertensive heart disease with heart failure: Secondary | ICD-10-CM | POA: Diagnosis not present

## 2015-10-02 DIAGNOSIS — Y939 Activity, unspecified: Secondary | ICD-10-CM | POA: Diagnosis not present

## 2015-10-02 DIAGNOSIS — F329 Major depressive disorder, single episode, unspecified: Secondary | ICD-10-CM | POA: Insufficient documentation

## 2015-10-02 DIAGNOSIS — Z7901 Long term (current) use of anticoagulants: Secondary | ICD-10-CM | POA: Diagnosis not present

## 2015-10-02 DIAGNOSIS — F410 Panic disorder [episodic paroxysmal anxiety] without agoraphobia: Secondary | ICD-10-CM | POA: Insufficient documentation

## 2015-10-02 DIAGNOSIS — Z0000 Encounter for general adult medical examination without abnormal findings: Secondary | ICD-10-CM | POA: Insufficient documentation

## 2015-10-02 DIAGNOSIS — W19XXXA Unspecified fall, initial encounter: Secondary | ICD-10-CM

## 2015-10-02 DIAGNOSIS — W06XXXA Fall from bed, initial encounter: Secondary | ICD-10-CM | POA: Insufficient documentation

## 2015-10-02 DIAGNOSIS — I482 Chronic atrial fibrillation: Secondary | ICD-10-CM | POA: Diagnosis not present

## 2015-10-02 DIAGNOSIS — Y998 Other external cause status: Secondary | ICD-10-CM | POA: Insufficient documentation

## 2015-10-02 DIAGNOSIS — I5022 Chronic systolic (congestive) heart failure: Secondary | ICD-10-CM | POA: Diagnosis not present

## 2015-10-02 NOTE — ED Notes (Signed)
[facility] reports that [facility] was [facility]. EMS reports unwitnessed fall at said ocation. Per facility, pt is at baseline azt this time. Pt is alert to person and place. Pt is sleeping soundly with NAD noticed at this time.

## 2015-10-02 NOTE — ED Notes (Signed)
Changed patient diaper,with help from nurse Annette StableBill

## 2015-10-02 NOTE — ED Notes (Signed)
Per [facility], sent pt back via ems

## 2015-10-02 NOTE — ED Provider Notes (Signed)
[facility] Emergency Department Provider Note  ____________________________________________    I have reviewed the triage vital signs and the nursing notes.   HISTORY  Chief Complaint Fall    HPI [name] is a 82 y.o. female who presents from facility after being found on the floor next to her bed. Patient is bedbound. Apparently her baseline mental status is significantly confused. Patient has a history of frequent falls.     Past Medical History  Diagnosis Date  . Persistent atrial fibrillation (HCC)     a. Eliquis 5 mg bid; b. CHADS2VASc = 5 (CHF, HTN, age x 2, female)  . Chronic systolic CHF (congestive heart failure) (HCC)     a. echo 07/2014: EF 45-50%, dilated LA at 4.5 cm, mildly dilated RA, mod-sev MR, mod TR, mildly elevated PASP @ 45.2  . HTN (hypertension)   . Cognitive impairment   . Gait instability   . Depression   . Panic attacks   . Arthritis   . Hyperlipidemia   . Parkinson's disease (HCC)   . Anxiety disorder     Patient Active Problem List   Diagnosis Date Noted  . Altered mental state 09/18/2015  . UTI (lower urinary tract infection) 09/18/2015  . Pressure ulcer 09/18/2015  . Acute suppurative parotitis   . Atrial fibrillation with RVR (HCC)   . Hypokalemia   . Sepsis (HCC)   . Morbid obesity due to excess calories (HCC)   . Submandibular gland infection 08/13/2015  . Bilateral leg edema 10/24/2014  . Chronic atrial fibrillation (HCC) 08/23/2014  . Chronic systolic CHF (congestive heart failure) (HCC)   . Persistent atrial fibrillation (HCC)   . HTN (hypertension)   . Cognitive impairment   . Gait instability   . Depression   . Panic attacks     Past Surgical History  Procedure Laterality Date  . Eye surgery      Current Outpatient Rx  Name  Route  Sig  Dispense  Refill  . acetaminophen (TYLENOL) 325 MG tablet   Oral   Take 650 mg by mouth every 6 (six) hours as needed for mild pain, moderate pain or  fever.         . Amantadine HCl 100 MG tablet   Oral   Take 100 mg by mouth 2 (two) times daily.          Marland Kitchen apixaban (ELIQUIS) 5 MG TABS tablet   Oral   Take 1 tablet (5 mg total) by mouth 2 (two) times daily.   60 tablet   0   . azithromycin (ZITHROMAX) 250 MG tablet   Oral   Take 250 mg by mouth daily. For 4 days         . docusate sodium (COLACE) 100 MG capsule   Oral   Take 100 mg by mouth 2 (two) times daily.         . fexofenadine (ALLEGRA) 180 MG tablet   Oral   Take 180 mg by mouth daily.         . furosemide (LASIX) 20 MG tablet   Oral   Take 20 mg by mouth 2 (two) times daily.          Marland Kitchen ipratropium-albuterol (DUONEB) 0.5-2.5 (3) MG/3ML SOLN   Nebulization   Take 3 mLs by nebulization every 6 (six) hours as needed (for shortness of breath/wheezing.).          Marland Kitchen ipratropium-albuterol (DUONEB) 0.5-2.5 (3) MG/3ML SOLN  Nebulization   Take 3 mLs by nebulization every 12 (twelve) hours. For 1 week         . levothyroxine (SYNTHROID, LEVOTHROID) 50 MCG tablet   Oral   Take 50 mcg by mouth daily after breakfast.          . magnesium hydroxide (MILK OF MAGNESIA) 400 MG/5ML suspension   Oral   Take 5 mLs by mouth 4 (four) times daily as needed for mild constipation or moderate constipation.         . metoprolol (LOPRESSOR) 50 MG tablet   Oral   Take 50 mg by mouth 2 (two) times daily.          . montelukast (SINGULAIR) 10 MG tablet   Oral   Take 10 mg by mouth at bedtime.          . Multiple Vitamin (MULTIVITAMIN) tablet   Oral   Take 1 tablet by mouth daily.         . mupirocin ointment (BACTROBAN) 2 %   Nasal   Place 1 application into the nose 2 (two) times daily.   22 g   0   . PARoxetine (PAXIL) 20 MG tablet   Oral   Take 20 mg by mouth every morning.          . potassium chloride SA (K-DUR,KLOR-CON) 20 MEQ tablet   Oral   Take 20 mEq by mouth daily.            Allergies Aliskiren-hydrochlorothiazide;  Amlodipine; and Sulfa antibiotics  Family History  Problem Relation Age of Onset  . Heart disease Sister     Social History Social History  Substance Use Topics  . Smoking status: Never Smoker   . Smokeless tobacco: None  . Alcohol Use: No    Level V caveat: Unable to obtain full Review of Systems due to patient's baseline altered mental status  Constitutional: Negative for dizziness Eyes: Negative for change in vision ENT: Negative for neck pain Cardiovascular: Negative for chest pain Respiratory: Negative for shortness of breath. Gastrointestinal: Negative for abdominal pain Genitourinary: Denies dysuria Musculoskeletal: Denies back pain Skin: Negative for rash. Neurological: Negative for focal weakness Psychiatric: no anxiety    ____________________________________________   PHYSICAL EXAM:  VITAL SIGNS: ED Triage Vitals  Enc Vitals Group     BP 10/02/15 0559 122/80 mmHg     Pulse Rate 10/02/15 0559 59     Resp 10/02/15 0559 16     Temp 10/02/15 0559 97.5 F (36.4 C)     Temp Source 10/02/15 0559 Oral     SpO2 10/02/15 0559 99 %     Weight 10/02/15 0559 151 lb 3.2 oz (68.584 kg)     Height 10/02/15 0559 5\' 7"  (1.702 m)     Head Cir --      Peak Flow --      Pain Score 10/02/15 0557 Asleep     Pain Loc --      Pain Edu? --      Excl. in GC? --      Constitutional: Alert and oriented. Well appearing and in no distress.  Eyes: Conjunctivae are normal. No erythema or injection ENT   Head: Normocephalic and atraumatic.   Mouth/Throat: Mucous membranes are moist. Cardiovascular: Normal rate, regular rhythm. Normal and symmetric distal pulses are present in the upper extremities Respiratory: Normal respiratory effort without tachypnea nor retractions. Breath sounds are clear and equal bilaterally.  Gastrointestinal: Soft and non-tender in  all quadrants. No distention. There is no CVA tenderness. Genitourinary: deferred Musculoskeletal: Nontender  extremities. No pain with extensive ranging of hip joints and upper extremities. No bony abnormalities. Neurologic:  Normal speech and language. No gross focal neurologic deficits are appreciated. Skin:  Skin is warm, dry and intact. No rash noted. Psychiatric: Mood and affect are normal. Patient exhibits appropriate insight and judgment.  ____________________________________________    LABS (pertinent positives/negatives)  Labs Reviewed - No data to display  ____________________________________________   EKG  None  ____________________________________________    RADIOLOGY  CT head unremarkable  ____________________________________________   PROCEDURES  Procedure(s) performed: none  Critical Care performed: none  ____________________________________________   INITIAL IMPRESSION / ASSESSMENT AND PLAN / ED COURSE  Pertinent labs & imaging results that were available during my care of the patient were reviewed by me and considered in my medical decision making (see chart for details).  No evidence of bony abnormalities on extensive physical exam. CT head ordered given eliquis use.  CT negative. Patient appears to be at her baseline. We will discharge to her facility ____________________________________________   FINAL CLINICAL IMPRESSION(S) / ED DIAGNOSES  Final diagnoses:  Fall, initial encounter          Jene Every, MD 10/02/15 1610

## 2015-12-04 ENCOUNTER — Telehealth: Payer: Self-pay | Admitting: Cardiovascular Disease

## 2015-12-04 ENCOUNTER — Encounter: Payer: Medicare Other | Attending: Surgery | Admitting: Surgery

## 2015-12-04 DIAGNOSIS — I890 Lymphedema, not elsewhere classified: Secondary | ICD-10-CM | POA: Insufficient documentation

## 2015-12-04 DIAGNOSIS — S41101A Unspecified open wound of right upper arm, initial encounter: Secondary | ICD-10-CM | POA: Diagnosis present

## 2015-12-04 DIAGNOSIS — I4891 Unspecified atrial fibrillation: Secondary | ICD-10-CM | POA: Insufficient documentation

## 2015-12-04 DIAGNOSIS — G3184 Mild cognitive impairment, so stated: Secondary | ICD-10-CM | POA: Insufficient documentation

## 2015-12-04 DIAGNOSIS — F0390 Unspecified dementia without behavioral disturbance: Secondary | ICD-10-CM | POA: Diagnosis not present

## 2015-12-04 DIAGNOSIS — I110 Hypertensive heart disease with heart failure: Secondary | ICD-10-CM | POA: Insufficient documentation

## 2015-12-04 DIAGNOSIS — L89629 Pressure ulcer of left heel, unspecified stage: Secondary | ICD-10-CM | POA: Insufficient documentation

## 2015-12-04 DIAGNOSIS — J449 Chronic obstructive pulmonary disease, unspecified: Secondary | ICD-10-CM | POA: Diagnosis not present

## 2015-12-04 DIAGNOSIS — Z7901 Long term (current) use of anticoagulants: Secondary | ICD-10-CM | POA: Insufficient documentation

## 2015-12-04 DIAGNOSIS — X58XXXA Exposure to other specified factors, initial encounter: Secondary | ICD-10-CM | POA: Insufficient documentation

## 2015-12-04 DIAGNOSIS — I5042 Chronic combined systolic (congestive) and diastolic (congestive) heart failure: Secondary | ICD-10-CM | POA: Diagnosis not present

## 2015-12-04 NOTE — Progress Notes (Addendum)
LAKESIA, DAHLE ([id number]) Visit Report for 12/04/2015 Chief Complaint Document Details Patient Name: [name], [name] Date of Service: 12/04/2015 8:00 AM Medical Record Number: [id number] Patient Account Number: [id number] Date of Birth/Sex: 07/28/1928 (82 y.o. Female) Treating RN: Cornell Barman Primary Care Physician: SYSTEM, PCP Other Clinician: Referring Physician: Treating Physician/Extender: Frann Rider in Treatment: 0 Information Obtained from: Patient Chief Complaint Patient seen for complaints of Non-Healing Wound to the right upper arm laterally near the elbow for about a month. She also has some skin changes on her left heel. Electronic Signature(s) Signed: 12/04/2015 9:31:54 AM By: Christin Fudge MD, FACS Entered By: Christin Fudge on 12/04/2015 09:31:53 [name] ([id number]) -------------------------------------------------------------------------------- HPI Details Patient Name: [name] Date of Service: 12/04/2015 8:00 AM Medical Record Number: [id number] Patient Account Number: [id number] Date of Birth/Sex: [birth year] (82 y.o. Female) Treating RN: Cornell Barman Primary Care Physician: SYSTEM, PCP Other Clinician: Referring Physician: Treating Physician/Extender: Frann Rider in Treatment: 0 History of Present Illness Location: wound on her right arm laterally near the elbow Quality: Patient reports experiencing a dull pain to affected area(s). Severity: Patient states wound are getting worse. Duration: Patient has had the wound for < 4 weeks prior to presenting for treatment Timing: Pain in wound is Intermittent (comes and goes Context: The wound appeared gradually over time Modifying Factors: Other treatment(s) tried include:local care with dressing changes and has recently been on clindamycin Associated Signs and Symptoms: Patient reports having increase swelling all over the body including upper arms and legs. HPI Description: 82 year old patient was been  referred to [place] for a ulcerated area on her right elbow. She has a past medical history of CHF, atrial fibrillation, hypertension, gait instability, bilateral lower extremity edema, morbid obesity, altered mental status. She has never been a smoker. her cardiologist is Dr. Ida Rogue, who last saw her in March 2017.he has been seeing her with a history of hypertension, gait instability, cognitive impairment, A. fib with RVR and chronic systolic CHF. she is on Eliquis 5 mg twice a day and for the bilateral lower extremity lymphedema he had recommended elevation and compression hose. Electronic Signature(s) Signed: 12/04/2015 9:37:25 AM By: Christin Fudge MD, FACS Previous Signature: 12/04/2015 8:19:52 AM Version By: Christin Fudge MD, FACS Entered By: Christin Fudge on 12/04/2015 09:37:25 [name] ([id number]) -------------------------------------------------------------------------------- Physical Exam Details Patient Name: [name] Date of Service: 12/04/2015 8:00 AM Medical Record Number: [id number] Patient Account Number: [id number] Date of Birth/Sex: [birth year] (82 y.o. Female) Treating RN: Cornell Barman Primary Care Physician: SYSTEM, PCP Other Clinician: Referring Physician: Treating Physician/Extender: Frann Rider in Treatment: 0 Constitutional . Pulse regular. Respirations normal and unlabored. Afebrile. . Eyes Nonicteric. Reactive to light. Ears, Nose, Mouth, and Throat Lips, teeth, and gums WNL.Marland Kitchen Moist mucosa without lesions. Neck supple and nontender. No palpable supraclavicular or cervical adenopathy. Normal sized without goiter. Respiratory WNL. No retractions.. Breath sounds WNL, No rubs, rales, rhonchi, or wheeze.. Cardiovascular left ABI is 1.56. she has bilateral stage II lymphedema of both lower extremities. Gastrointestinal (GI) Abdomen without masses or tenderness.. No liver or spleen enlargement or tenderness.. Lymphatic No adneopathy. No adenopathy. No  adenopathy. Musculoskeletal Adexa without tenderness or enlargement.. Digits and nails w/o clubbing, cyanosis, infection, petechiae, ischemia, or inflammatory conditions.. Integumentary (Hair, Skin) No suspicious lesions. No crepitus or fluctuance. No peri-wound warmth or erythema. No masses.Marland Kitchen Psychiatric Judgement and insight Intact.. No evidence of depression, anxiety, or agitation.. Notes on her right arm laterally near the elbow she has got a large necrotic ulcer  with full-thickness skin and subcutaneous debris and this is not draining any pus and there is no surrounding cellulitis. on her left heel she has some unstageable pressure injuries and there is no surrounding cellulitis. Electronic Signature(s) Signed: 12/04/2015 9:39:20 AM By: Christin Fudge MD, FACS Previous Signature: 12/04/2015 9:38:26 AM Version By: Christin Fudge MD, FACS Entered By: Christin Fudge on 12/04/2015 09:39:19 [name] ([id number]) -------------------------------------------------------------------------------- Physician Orders Details Patient Name: [name] Date of Service: 12/04/2015 8:00 AM Medical Record Number: [id number] Patient Account Number: [id number] Date of Birth/Sex: June 06, 1929 (82 y.o. Female) Treating RN: Cornell Barman Primary Care Physician: SYSTEM, PCP Other Clinician: Referring Physician: Treating Physician/Extender: Frann Rider in Treatment: 0 Verbal / Phone Orders: Yes Clinician: Cornell Barman Read Back and Verified: Yes Diagnosis Coding Wound Cleansing Wound #1 Right Elbow o Clean wound with Normal Saline. Anesthetic Wound #1 Right Elbow o Topical Lidocaine 4% cream applied to wound bed prior to debridement Primary Wound Dressing Wound #1 Right Elbow o Santyl Ointment Secondary Dressing Wound #1 Right Elbow o Dry Gauze o Boardered Foam Dressing Dressing Change Frequency Wound #1 Right Elbow o Change dressing every day. Follow-up Appointments Wound #1 Right  Elbow o Return Appointment in 1 week. Edema Control o Elevate legs to the level of the heart and pump ankles as often as possible - Float heels, elevate legs on a couple pillows when in bed. Sage boots. Additional Orders / Instructions Wound #1 Right Elbow o Increase protein intake. Home Health Wound #1 Right Elbow [name], [name] ([id number]) o [facility] Nurse may visit PRN to address patientos wound care needs. o FACE TO FACE ENCOUNTER: MEDICARE and MEDICAID PATIENTS: I certify that this patient is under my care and that I had a face-to-face encounter that meets the physician face-to-face encounter requirements with this patient on this date. The encounter with the patient was in whole or in part for the following MEDICAL CONDITION: (primary reason for [place]) MEDICAL NECESSITY: I certify, that based on my findings, NURSING services are a medically necessary home health service. HOME BOUND STATUS: I certify that my clinical findings support that this patient is homebound (i.e., Due to illness or injury, pt requires aid of supportive devices such as crutches, cane, wheelchairs, walkers, the use of special transportation or the assistance of another person to leave their place of residence. There is a normal inability to leave the home and doing so requires considerable and taxing effort. Other absences are for medical reasons / religious services and are infrequent or of short duration when for other reasons). o If current dressing causes regression in wound condition, may D/C ordered dressing product/s and apply Normal Saline Moist Dressing daily until next [place] / Other MD appointment. [place] of regression in wound condition at [phone]. o Please direct any NON-WOUND related issues/requests for orders to patient's Primary Care Physician Medications-please add to medication  list. Wound #1 Right Elbow o Santyl Enzymatic Ointment o Other: - *****Nursing home to coordinate with cardiologist to hold Eliquis Saturday and Sunday; Patient to return to wound care on Monday for Debridement. Radiology o X-ray, elbow - ****Right Elbow x-ray to be done in facility. Please fax results to [phone]. Patient Medications Allergies: sulfur Notifications Medication Indication Start End Santyl Elbow wound 12/04/2015 DOSE topical 250 unit/gram ointment - ointment topical 90 grams Electronic Signature(s) Signed: 12/04/2015 4:01:06 PM By: Christin Fudge MD, FACS Signed: 12/05/2015 4:33:07 PM By: Gretta Cool, RN, BSN,  Maudie Mercury RN, BSN Entered By: Gretta Cool, RN, BSN, Kim on 12/04/2015 10:28:16 TALAJAH, SLIMP ([id number]) -------------------------------------------------------------------------------- Prescription 12/04/2015 Patient Name: [name] Physician: Christin Fudge MD Date of Birth: 14-Nov-1928 NPI#: [id number] Sex: F DEA#: [id number] Phone #: [phone] License #: Patient Address: [address] [place], [place] [address] [facility] [address] [place], [place] [address] [id number] Allergies sulfur Medication Medication: Route: Strength: Form: Santyl topical 250 unit/gram ointment Class: TOPICAL/MUCOUS MEMBR./SUBCUT. ENZYMES Dose: Frequency / Time: Indication: ointment topical 90 grams Elbow wound Number of Refills: Number of Units: 2 Sixty (60) Gram(s) Generic Substitution: Start Date: End Date: Administered at Substitution Permitted 11/01/84 Facility: No Note to Pharmacy: Manufacturer's Note: The total quantity has been calculated in grams due to Santyl being available in either 30 or 90 gram tubes. Signature(s): Date(s): BRELEE, RENK ([id number]) Electronic Signature(s) Signed: 12/04/2015 4:01:06 PM By: Christin Fudge MD, FACS Signed: 12/05/2015 4:33:07 PM By: Gretta Cool RN, BSN,  Kim RN, BSN Entered By: Gretta Cool, RN, BSN, Kim on 12/04/2015 10:28:16 [name] ([id number]) --------------------------------------------------------------------------------  Problem List Details Patient Name: [name] Date of Service: 12/04/2015 8:00 AM Medical Record Number: [id number] Patient Account Number: [id number] Date of Birth/Sex: 05-07-1929 (82 y.o. Female) Treating RN: Cornell Barman Primary Care Physician: SYSTEM, PCP Other Clinician: Referring Physician: Treating Physician/Extender: Frann Rider in Treatment: 0 Active Problems ICD-10 Encounter Code Description Active Date Diagnosis S41.101A Unspecified open wound of right upper arm, initial 12/04/2015 Yes encounter L89.629 Pressure ulcer of left heel, unspecified stage 12/04/2015 Yes I50.40 Unspecified combined systolic (congestive) and diastolic 09/07/7671 Yes (congestive) heart failure I89.0 Lymphedema, not elsewhere classified 12/04/2015 Yes Inactive Problems Resolved Problems Electronic Signature(s) Signed: 12/04/2015 9:42:25 AM By: Christin Fudge MD, FACS Previous Signature: 12/04/2015 9:31:17 AM Version By: Christin Fudge MD, FACS Entered By: Christin Fudge on 12/04/2015 09:42:25 [name] ([id number]) -------------------------------------------------------------------------------- Progress Note Details Patient Name: [name] Date of Service: 12/04/2015 8:00 AM Medical Record Number: [id number] Patient Account Number: [id number] Date of Birth/Sex: 01-Apr-1929 (82 y.o. Female) Treating RN: Cornell Barman Primary Care Physician: SYSTEM, PCP Other Clinician: Referring Physician: Treating Physician/Extender: Frann Rider in Treatment: 0 Subjective Chief Complaint Information obtained from Patient Patient seen for complaints of Non-Healing Wound to the right upper arm laterally near the elbow for about a month. She also has some skin changes on her left heel. History of Present Illness (HPI) The following HPI  elements were documented for the patient's wound: Location: wound on her right arm laterally near the elbow Quality: Patient reports experiencing a dull pain to affected area(s). Severity: Patient states wound are getting worse. Duration: Patient has had the wound for < 4 weeks prior to presenting for treatment Timing: Pain in wound is Intermittent (comes and goes Context: The wound appeared gradually over time Modifying Factors: Other treatment(s) tried include:local care with dressing changes and has recently been on clindamycin Associated Signs and Symptoms: Patient reports having increase swelling all over the body including upper arms and legs. 82 year old patient was been referred to [place] for a ulcerated area on her right elbow. She has a past medical history of CHF, atrial fibrillation, hypertension, gait instability, bilateral lower extremity edema, morbid obesity, altered mental status. She has never been a smoker. her cardiologist is Dr. Ida Rogue, who last saw her in March 2017.he has been seeing her with a history of hypertension, gait instability, cognitive impairment, A. fib with RVR and chronic systolic CHF. she is on Eliquis 5 mg  twice a day and for the bilateral lower extremity lymphedema he had recommended elevation and compression hose. Wound History Patient presents with 1 open wound that has been present for approximately 1 month. Patient has been treating wound in the following manner: bordered foam. Laboratory tests have not been performed in the last month. Patient reportedly has not tested positive for an antibiotic resistant organism. Patient reportedly has not tested positive for osteomyelitis. Patient reportedly has not had testing performed to evaluate circulation in the legs. Patient History Information obtained from Patient. Allergies Gregori, Drue ([id number]) sulfur Social History Never smoker, Marital Status - Single, Alcohol Use - Never, Drug Use - No  History, Caffeine Use - Daily. Medical History Eyes Patient has history of Cataracts - surgery Denies history of Glaucoma, Optic Neuritis Ear/Nose/Mouth/Throat Denies history of Chronic sinus problems/congestion, Middle ear problems Hematologic/Lymphatic Patient has history of Lymphedema Denies history of Anemia, Hemophilia, Human Immunodeficiency Virus, Sickle Cell Disease Respiratory Patient has history of Aspiration - Thickened liquids, Chronic Obstructive Pulmonary Disease (COPD) Denies history of Asthma, Pneumothorax, Sleep Apnea, Tuberculosis Cardiovascular Patient has history of Arrhythmia, Congestive Heart Failure, Hypertension, Peripheral Venous Disease Denies history of Angina, Coronary Artery Disease, Deep Vein Thrombosis, Hypotension, Myocardial Infarction, Peripheral Arterial Disease, Phlebitis Gastrointestinal Denies history of Cirrhosis , Colitis, Crohn s, Hepatitis A, Hepatitis B, Hepatitis C Endocrine Denies history of Type I Diabetes, Type II Diabetes Genitourinary Denies history of End Stage Renal Disease Immunological Denies history of Lupus Erythematosus, Raynaud s Integumentary (Skin) Denies history of History of Burn, History of pressure wounds Musculoskeletal Patient has history of Osteoarthritis Denies history of Gout, Rheumatoid Arthritis, Osteomyelitis Neurologic Patient has history of Dementia Denies history of Neuropathy, Quadriplegia, Paraplegia, Seizure Disorder Oncologic Denies history of Received Chemotherapy, Received Radiation Psychiatric Denies history of Anorexia/bulimia Hospitalization/Surgery History - 08/02/2015, [facility], Salava Gland infection. - 08/30/2015, [facility], UTI. Medical And Surgical History Notes Constitutional Symptoms (General Health) A fib; CHF; HTN; Arthritis; Parkinson's; AMS; UTI; Review of Systems (ROS) Constitutional Symptoms (General Health) Dragovich, Karalee ([id number]) The patient has no complaints or symptoms. Eyes The  patient has no complaints or symptoms. Ear/Nose/Mouth/Throat The patient has no complaints or symptoms. Hematologic/Lymphatic The patient has no complaints or symptoms. Respiratory The patient has no complaints or symptoms. Cardiovascular Complains or has symptoms of LE edema. Denies complaints or symptoms of Chest pain. Gastrointestinal The patient has no complaints or symptoms. Endocrine The patient has no complaints or symptoms. Genitourinary Complains or has symptoms of Incontinence/dribbling. Denies complaints or symptoms of Kidney failure/ Dialysis. Immunological The patient has no complaints or symptoms. Integumentary (Skin) Complains or has symptoms of Wounds, Bleeding or bruising tendency, Breakdown, Swelling. Musculoskeletal Complains or has symptoms of Muscle Weakness. Denies complaints or symptoms of Muscle Pain. Neurologic The patient has no complaints or symptoms. Oncologic The patient has no complaints or symptoms. Psychiatric Complains or has symptoms of Anxiety. Medications Mapap (acetaminophen) 325 mg tablet oral 2 2 tablet oral (650 mg.) every six hours as needed fexofenadine 180 mg tablet oral 1 1 tablet oral daily amantadine HCl 100 mg capsule oral 1 1 capsule oral two times daily ipratropium-albuterol 0.5 mg-3 mg(2.5 mg base)/3 mL nebulization soln inhalation 1 1 solution for nebulization inhalation every six hours as needed metoprolol tartrate 50 mg tablet oral 1 1 tablet oral two times daily Eliquis 5 mg tablet oral 1 1 tablet oral two times daily guaifenesin 100 mg/5 mL oral liquid oral liquid oral 10 mL every six hours as needed docusate sodium 100 mg  capsule oral 1 1 capsule oral two times daily montelukast 10 mg tablet oral 1 1 tablet oral nightly clindamycin 300 mg capsule oral 1 1 capsule oral three times daily furosemide 20 mg tablet oral 1 1 tablet oral two times daily multivitamin tablet oral 1 1 tablet oral daily Petit, Kameria  ([id number]) potassium chloride ER 20 mEq tablet,extended release oral 2 2 tablet (40 mEq.) extended release oral two times daily paroxetine 20 mg tablet oral 1 1 tablet oral daily levothyroxine 50 mcg tablet oral 1 1 tablet oral daily Objective Constitutional Pulse regular. Respirations normal and unlabored. Afebrile. Vitals Time Taken: 8:17 AM, Height: 64 in, Weight: 153 lbs, BMI: 26.3, Temperature: 98.7 F, Pulse: 78 bpm, Blood Pressure: 123/57 mmHg. Eyes Nonicteric. Reactive to light. Ears, Nose, Mouth, and Throat Lips, teeth, and gums WNL.Marland Kitchen Moist mucosa without lesions. Neck supple and nontender. No palpable supraclavicular or cervical adenopathy. Normal sized without goiter. Respiratory WNL. No retractions.. Breath sounds WNL, No rubs, rales, rhonchi, or wheeze.. Cardiovascular left ABI is 1.56. she has bilateral stage II lymphedema of both lower extremities. Gastrointestinal (GI) Abdomen without masses or tenderness.. No liver or spleen enlargement or tenderness.. Lymphatic No adneopathy. No adenopathy. No adenopathy. Musculoskeletal Adexa without tenderness or enlargement.. Digits and nails w/o clubbing, cyanosis, infection, petechiae, ischemia, or inflammatory conditions.Marland Kitchen Psychiatric Judgement and insight Intact.. No evidence of depression, anxiety, or agitation.. General Notes: on her right arm laterally near the elbow she has got a large necrotic ulcer with full- thickness skin and subcutaneous debris and this is not draining any pus and there is no surrounding cellulitis. on her left heel she has some unstageable pressure injuries and there is no surrounding cellulitis. [name], [name] ([id number]) Integumentary (Hair, Skin) No suspicious lesions. No crepitus or fluctuance. No peri-wound warmth or erythema. No masses.. Wound #1 status is Open. Original cause of wound was Gradually Appeared. The wound is located on the Right Elbow. The wound measures 4cm length x 3.5cm  width x 1.7cm depth; 10.996cm^2 area and 18.692cm^3 volume. The wound is limited to skin breakdown. There is no tunneling or undermining noted. There is a large amount of serosanguineous drainage noted. The wound margin is flat and intact. There is no granulation within the wound bed. There is a large (67-100%) amount of necrotic tissue within the wound bed including Eschar and Adherent Slough. The periwound skin appearance exhibited: Localized Edema, Moist, Erythema. The surrounding wound skin color is noted with erythema which is circumferential. Periwound temperature was noted as No Abnormality. The periwound has tenderness on palpation. Other Condition(s) Patient presents with Suspected Deep Tissue Injury located on the Left Foot. The skin appearance exhibited: Dry/Scaly. The skin appearance did not exhibit: Atrophie Blanche, Callus, Crepitus, Cyanosis, Ecchymosis, Erythema, Excoriation, Fluctuance, Friable, Hemosiderin Staining, Induration, Localized Edema, Maceration, Moist, Mottled, Pallor, Rash, Rubor, Scarring. General Notes: Left Heel and medial 1st met head; Assessment Active Problems ICD-10 S41.101A - Unspecified open wound of right upper arm, initial encounter L89.629 - Pressure ulcer of left heel, unspecified stage I50.40 - Unspecified combined systolic (congestive) and diastolic (congestive) heart failure I89.0 - Lymphedema, not elsewhere classified this 82 year old patient who comes along with her sister has multiple chronic medical problems but is here mainly for a significant open wound of her right upper arm which was sustained over a month ago. Incidentally she was also noted to have some unstageable pressure injuries to her left heel. Thorough discussion is had with her sister who has the power of attorney and at  present I have recommended: 1. X-ray of the right upper arm to review any possibility of bony involvement. 2. Talking to her cardiologist to hold the  anticoagulation appropriately at least for 48 hours before and debridement can be attempted in the office. 3. Santyl ointment locally which should be applied daily. 4. We have discussed offloading techniques in detail for the lower extremities and at the present time will apply some Betadine paint to the heels [name], [name] ([id number]) 5. Review at the wound center next week and I would be happy to discuss with his cardiologist if the need arises Plan Wound Cleansing: Wound #1 Right Elbow: Clean wound with Normal Saline. Anesthetic: Wound #1 Right Elbow: Topical Lidocaine 4% cream applied to wound bed prior to debridement Primary Wound Dressing: Wound #1 Right Elbow: Santyl Ointment Secondary Dressing: Wound #1 Right Elbow: Dry Gauze Boardered Foam Dressing Dressing Change Frequency: Wound #1 Right Elbow: Change dressing every day. Follow-up Appointments: Wound #1 Right Elbow: Return Appointment in 1 week. Edema Control: Elevate legs to the level of the heart and pump ankles as often as possible - Float heels, elevate legs on a couple pillows when in bed. Sage boots. Additional Orders / Instructions: Wound #1 Right Elbow: Increase protein intake. Home Health: Wound #1 Right Elbow: Continue Home Health Visits - [facility] Nurse may visit PRN to address patient s wound care needs. FACE TO FACE ENCOUNTER: MEDICARE and MEDICAID PATIENTS: I certify that this patient is under my care and that I had a face-to-face encounter that meets the physician face-to-face encounter requirements with this patient on this date. The encounter with the patient was in whole or in part for the following MEDICAL CONDITION: (primary reason for [place]) MEDICAL NECESSITY: I certify, that based on my findings, NURSING services are a medically necessary home health service. HOME BOUND STATUS: I certify that my clinical findings support that this patient is homebound (i.e., Due  to illness or injury, pt requires aid of supportive devices such as crutches, cane, wheelchairs, walkers, the use of special transportation or the assistance of another person to leave their place of residence. There is a normal inability to leave the home and doing so requires considerable and taxing effort. Other absences are for medical reasons / religious services and are infrequent or of short duration when for other reasons). If current dressing causes regression in wound condition, may D/C ordered dressing product/s and apply Normal Saline Moist Dressing daily until next [place] / Other MD appointment. [place] of regression in wound condition at [phone]. Please direct any NON-WOUND related issues/requests for orders to patient's Primary Care Physician Medications-please add to medication list.: Wound #1 Right Elbow: [name], [name] ([id number]) Santyl Enzymatic Ointment Other: - *****Nursing home to coordinate with cardiologist to hold Eliquis Saturday and Sunday; Patient to return to wound care on Monday for Debridement. Radiology ordered were: X-ray, elbow - ****Right Elbow x-ray to be done in facility. Please fax results to [phone]. The following medication(s) was prescribed: Santyl topical 250 unit/gram ointment ointment topical 90 grams for Elbow wound starting 12/04/2015 this 82 year old patient who comes along with her sister has multiple chronic medical problems but is here mainly for a significant open wound of her right upper arm which was sustained over a month ago. Incidentally she was also noted to have some unstageable pressure injuries to her left heel. Thorough discussion is had with her sister who has the power of attorney and at present I  have recommended: 1. X-ray of the right upper arm to review any possibility of bony involvement. 2. Talking to her cardiologist to hold the anticoagulation appropriately at least for 48 hours  before and debridement can be attempted in the office. 3. Santyl ointment locally which should be applied daily. 4. We have discussed offloading techniques in detail for the lower extremities and at the present time will apply some Betadine paint to the heels 5. Review at the wound center next week and I would be happy to discuss with his cardiologist if the need arises Electronic Signature(s) Signed: 12/04/2015 4:23:22 PM By: Christin Fudge MD, FACS Previous Signature: 12/04/2015 9:42:39 AM Version By: Christin Fudge MD, FACS Previous Signature: 12/04/2015 9:41:46 AM Version By: Christin Fudge MD, FACS Entered By: Christin Fudge on 12/04/2015 16:23:22 [name] ([id number]) -------------------------------------------------------------------------------- ROS/PFSH Details Patient Name: [name] Date of Service: 12/04/2015 8:00 AM Medical Record Number: [id number] Patient Account Number: [id number] Date of Birth/Sex: [birth year] (82 y.o. Female) Treating RN: Cornell Barman Primary Care Physician: SYSTEM, PCP Other Clinician: Referring Physician: Treating Physician/Extender: Frann Rider in Treatment: 0 Information Obtained From Patient Wound History Do you currently have one or more open woundso Yes How many open wounds do you currently haveo 1 Approximately how long have you had your woundso 1 month How have you been treating your wound(s) until nowo bordered foam Has your wound(s) ever healed and then re-openedo No Have you had any lab work done in the past montho No Have you tested positive for an antibiotic resistant organism (MRSA, VRE)o No Have you tested positive for osteomyelitis (bone infection)o No Have you had any tests for circulation on your legso No Cardiovascular Complaints and Symptoms: Positive for: LE edema Negative for: Chest pain Medical History: Positive for: Arrhythmia; Congestive Heart Failure; Hypertension; Peripheral Venous Disease Negative for: Angina;  Coronary Artery Disease; Deep Vein Thrombosis; Hypotension; Myocardial Infarction; Peripheral Arterial Disease; Phlebitis Genitourinary Complaints and Symptoms: Positive for: Incontinence/dribbling Negative for: Kidney failure/ Dialysis Medical History: Negative for: End Stage Renal Disease Integumentary (Skin) Complaints and Symptoms: Positive for: Wounds; Bleeding or bruising tendency; Breakdown; Swelling Medical History: Negative for: History of Burn; History of pressure wounds Musculoskeletal [name], [name] ([id number]) Complaints and Symptoms: Positive for: Muscle Weakness Negative for: Muscle Pain Medical History: Positive for: Osteoarthritis Negative for: Gout; Rheumatoid Arthritis; Osteomyelitis Psychiatric Complaints and Symptoms: Positive for: Anxiety Medical History: Negative for: Anorexia/bulimia Constitutional Symptoms (General Health) Complaints and Symptoms: No Complaints or Symptoms Medical History: Past Medical History Notes: A fib; CHF; HTN; Arthritis; Parkinson's; AMS; UTI; Eyes Complaints and Symptoms: No Complaints or Symptoms Medical History: Positive for: Cataracts - surgery Negative for: Glaucoma; Optic Neuritis Ear/Nose/Mouth/Throat Complaints and Symptoms: No Complaints or Symptoms Medical History: Negative for: Chronic sinus problems/congestion; Middle ear problems Hematologic/Lymphatic Complaints and Symptoms: No Complaints or Symptoms Medical History: Positive for: Lymphedema Negative for: Anemia; Hemophilia; Human Immunodeficiency Virus; Sickle Cell Disease [name], [name] ([id number]) Respiratory Complaints and Symptoms: No Complaints or Symptoms Medical History: Positive for: Aspiration - Thickened liquids; Chronic Obstructive Pulmonary Disease (COPD) Negative for: Asthma; Pneumothorax; Sleep Apnea; Tuberculosis Gastrointestinal Complaints and Symptoms: No Complaints or Symptoms Medical History: Negative for: Cirrhosis ; Colitis;  Crohnos; Hepatitis A; Hepatitis B; Hepatitis C Endocrine Complaints and Symptoms: No Complaints or Symptoms Medical History: Negative for: Type I Diabetes; Type II Diabetes Immunological Complaints and Symptoms: No Complaints or Symptoms Medical History: Negative for: Lupus Erythematosus; Raynaudos Neurologic Complaints and Symptoms: No Complaints or Symptoms Medical History: Positive for: Dementia Negative for:  Neuropathy; Quadriplegia; Paraplegia; Seizure Disorder Oncologic Complaints and Symptoms: No Complaints or Symptoms Medical History: Negative for: Received Chemotherapy; Received Radiation HBO Extended History Items [name], [name] ([id number]) Eyes: Cataracts Hospitalization / Surgery History Name of Hospital Purpose of Hospitalization/Surgery Date [facility] infection 08/02/2015 [place] UTI 08/30/2015 Family and Social History Never smoker; Marital Status - Single; Alcohol Use: Never; Drug Use: No History; Caffeine Use: Daily; Advanced Directives: No; Patient does not want information on Advanced Directives; Do not resuscitate: Yes (Copy provided); Living Will: No; Medical Power of Attorney: No Physician Affirmation I have reviewed and agree with the above information. Electronic Signature(s) Signed: 12/04/2015 9:38:42 AM By: Christin Fudge MD, FACS Signed: 12/05/2015 4:33:07 PM By: Gretta Cool RN, BSN, Kim RN, BSN Entered By: Christin Fudge on 12/04/2015 09:38:41 [name], [name] ([id number]) -------------------------------------------------------------------------------- [place] Details Patient Name: [name] Date of Service: 12/04/2015 Medical Record Number: [id number] Patient Account Number: [id number] Date of Birth/Sex: 27-Oct-1928 (82 y.o. Female) Treating RN: Cornell Barman Primary Care Physician: SYSTEM, PCP Other Clinician: Referring Physician: Treating Physician/Extender: Frann Rider in Treatment: 0 Diagnosis Coding ICD-10 Codes Code Description S41.101A  Unspecified open wound of right upper arm, initial encounter L89.629 Pressure ulcer of left heel, unspecified stage I50.40 Unspecified combined systolic (congestive) and diastolic (congestive) heart failure Facility Procedures CPT4 Code: 25750518 Description: 99214 - WOUND CARE VISIT-LEV 4 EST PT Modifier: Quantity: 1 Physician Procedures CPT4: Description Modifier Quantity Code [id number] [id number] - WC PHYS LEVEL 4 - NEW PT 1 ICD-10 Description Diagnosis S41.101A Unspecified open wound of right upper arm, initial encounter L89.629 Pressure ulcer of left heel, unspecified stage I50.40  Unspecified combined systolic (congestive) and diastolic (congestive) heart failure Electronic Signature(s) Signed: 12/04/2015 9:42:07 AM By: Christin Fudge MD, FACS Entered By: Christin Fudge on 12/04/2015 09:42:07

## 2015-12-04 NOTE — Telephone Encounter (Signed)
Patient has wound on elbow and needs debridement .  Wound center wants patient to hold eliquis 2 days prior to procedure.    Daughter also notes swelling Pt c/o swelling: STAT is pt has developed SOB within 24 hours  1. How long have you been experiencing swelling?  3 weeks for swelling in arm with wound and feet and legs she has had for a while also has compression sores on feet   2. Where is the swelling located? R am to fingers BLE to toes LE is where the sores are located  3.  Are you currently taking a "fluid pill"? Yes  X 2 daily   4.  Are you currently SOB? Yes per daughter noticed last night and hears wheezing  ( patient is on thickened liquids as they think she may have had some recent aspiration )   5.  Have you traveled recently?  Sits in wheel chair partial time with legs down then legs up

## 2015-12-04 NOTE — Telephone Encounter (Signed)
Spoke w/ Kathie RhodesBetty.  She reports that pt has a sore on her elbow that HH has been helping w/ but it needs to be debrided. She reports that operating MD recommends that pt hold Eliquis on Sat & Sun.  Advised her to have their office send over cardiac clearance request w/ recommendations.  She reports that pt has a lot of swelling in her LE. Pt has been taking lasix as prescribed, but she has not been wearing compression hose, as "one of those girls said they don't do any good." Advised her of the purpose of TED hose, but she states "everybody has their opinion". She is agreeable to having HH wrap pt's LE w/ ACE bandages.   [facility] that [facility] that pt has been "aspirating a little recently" and she would like pt evaluated. Advised her that Dr. Mariah MillingGollan is out of the office today; she requests an appt w/ Eula Listenyan Dunn, PA. Advised her that I will place her on the waiting list in the event of a cancellation.  She reports that pt has been using her nebulizer more frequently w/ good results.  Addressed each of pt's concerns and she has no further questions at this time.  Advised her that I will call if we have any cancellations this week.

## 2015-12-06 NOTE — Progress Notes (Signed)
[name], SCHOLTEN ([id number]) Visit Report for 12/04/2015 Allergy List Details Patient Name: [name], [name] Date of Service: 12/04/2015 8:00 AM Medical Record Number: [id number] Patient Account Number: [id number] Date of Birth/Sex: Mar 02, 1929 (82 y.o. Female) Treating RN: Cornell Barman Primary Care Physician: SYSTEM, PCP Other Clinician: Referring Physician: Treating Physician/Extender: Frann Rider in Treatment: 0 Allergies Active Allergies sulfur Allergy Notes Electronic Signature(s) Signed: 12/05/2015 4:33:07 PM By: Gretta Cool, RN, BSN, Kim RN, BSN Entered By: Gretta Cool, RN, BSN, Kim on 12/04/2015 08:41:09 [name] ([id number]) -------------------------------------------------------------------------------- Arrival Information Details Patient Name: [name] Date of Service: 12/04/2015 8:00 AM Medical Record Number: [id number] Patient Account Number: [id number] Date of Birth/Sex: [birth year] (82 y.o. Female) Treating RN: Cornell Barman Primary Care Physician: SYSTEM, PCP Other Clinician: Referring Physician: Treating Physician/Extender: Frann Rider in Treatment: 0 Visit Information Patient Arrived: Wheel Chair Arrival Time: 08:16 Accompanied By: Inez Catalina, sister Transfer Assistance: Manual Patient Identification Verified: Yes Secondary Verification Process Yes Completed: Patient Has Alerts: Yes Patient Alerts: Patient on Blood Thinner ABI: (L) 1.56 Eliquis Notes Two person assist for transfer. Electronic Signature(s) Signed: 12/05/2015 4:33:07 PM By: Gretta Cool, RN, BSN, Kim RN, BSN Entered By: Gretta Cool, RN, BSN, Kim on 12/04/2015 11:39:21 [name] ([id number]) -------------------------------------------------------------------------------- Clinic Level of Care Assessment Details Patient Name: [name] Date of Service: 12/04/2015 8:00 AM Medical Record Number: [id number] Patient Account Number: [id number] Date of Birth/Sex: 07-Apr-1929 (82 y.o. Female) Treating RN: Cornell Barman Primary Care Physician: SYSTEM, PCP Other Clinician: Referring Physician: Treating Physician/Extender: Frann Rider in Treatment: 0 Clinic Level of Care Assessment Items TOOL 2 Quantity Score []  - Use when only an EandM is performed on the INITIAL visit 0 ASSESSMENTS - Nursing Assessment / Reassessment X - General Physical Exam (combine w/ comprehensive assessment (listed just 1 20 below) when performed on new pt. evals) X - Comprehensive Assessment (HX, ROS, Risk Assessments, Wounds Hx, etc.) 1 25 ASSESSMENTS - Wound and Skin Assessment / Reassessment []  - Simple Wound Assessment / Reassessment - one wound 0 X - Complex Wound Assessment / Reassessment - multiple wounds 1 5 []  - Dermatologic / Skin Assessment (not related to wound area) 0 ASSESSMENTS - Ostomy and/or Continence Assessment and Care []  - Incontinence Assessment and Management 0 []  - Ostomy Care Assessment and Management (repouching, etc.) 0 PROCESS - Coordination of Care []  - Simple Patient / Family Education for ongoing care 0 X - Complex (extensive) Patient / Family Education for ongoing care 1 20 X - Staff obtains Programmer, systems, Records, Test Results / Process Orders 1 10 []  - Staff telephones HHA, Nursing Homes / Clarify orders / etc 0 []  - Routine Transfer to another Facility (non-emergent condition) 0 []  - Routine Hospital Admission (non-emergent condition) 0 []  - New Admissions / Biomedical engineer / Ordering NPWT, Apligraf, etc. 0 []  - Emergency Hospital Admission (emergent condition) 0 X - Simple Discharge Coordination 1 10 Viscuso, Jasslyn ([id number]) []  - Complex (extensive) Discharge Coordination 0 PROCESS - Special Needs []  - Pediatric / Minor Patient Management 0 []  - Isolation Patient Management 0 []  - Hearing / Language / Visual special needs 0 []  - Assessment of Community assistance (transportation, D/C planning, etc.) 0 []  - Additional assistance / Altered mentation 0 []  - Support  Surface(s) Assessment (bed, cushion, seat, etc.) 0 INTERVENTIONS - Wound Cleansing / Measurement X - Wound Imaging (photographs - any number of wounds) 1 5 []  - Wound Tracing (instead of photographs) 0 []  - Simple Wound Measurement - one wound 0 X - Complex Wound  Measurement - multiple wounds 1 5 []  - Simple Wound Cleansing - one wound 0 X - Complex Wound Cleansing - multiple wounds 1 5 INTERVENTIONS - Wound Dressings []  - Small Wound Dressing one or multiple wounds 0 X - Medium Wound Dressing one or multiple wounds 1 15 []  - Large Wound Dressing one or multiple wounds 0 []  - Application of Medications - injection 0 INTERVENTIONS - Miscellaneous []  - External ear exam 0 []  - Specimen Collection (cultures, biopsies, blood, body fluids, etc.) 0 []  - Specimen(s) / Culture(s) sent or taken to Lab for analysis 0 []  - Patient Transfer (multiple staff / Civil Service fast streamer / Similar devices) 0 []  - Simple Staple / Suture removal (25 or less) 0 []  - Complex Staple / Suture removal (26 or more) 0 Coaxum, Makaylyn ([id number]) []  - Hypo / Hyperglycemic Management (close monitor of Blood Glucose) 0 X - Ankle / Brachial Index (ABI) - do not check if billed separately 1 15 Has the patient been seen at the hospital within the last three years: Yes Total Score: 135 Level Of Care: New/Established - Level 4 Electronic Signature(s) Signed: 12/05/2015 4:33:07 PM By: Gretta Cool, RN, BSN, Kim RN, BSN Entered By: Gretta Cool, RN, BSN, Kim on 12/04/2015 09:14:55 [name] ([id number]) -------------------------------------------------------------------------------- Encounter Discharge Information Details Patient Name: [name] Date of Service: 12/04/2015 8:00 AM Medical Record Number: [id number] Patient Account Number: [id number] Date of Birth/Sex: 1929/06/15 (82 y.o. Female) Treating RN: Cornell Barman Primary Care Physician: SYSTEM, PCP Other Clinician: Referring Physician: Treating Physician/Extender: Frann Rider in  Treatment: 0 Encounter Discharge Information Items Discharge Pain Level: 0 Discharge Condition: Stable Ambulatory Status: Wheelchair Discharge Destination: Home Transportation: Private Auto Accompanied By: sister Schedule Follow-up Appointment: Yes Medication Reconciliation completed Yes and provided to Patient/Care Soila Printup: Provided on Clinical Summary of Care: 12/04/2015 Form Type Recipient Paper Patient MJ Electronic Signature(s) Signed: 12/04/2015 9:45:10 AM By: Sharon Mt Entered By: Sharon Mt on 12/04/2015 09:45:09 [name] ([id number]) -------------------------------------------------------------------------------- Lower Extremity Assessment Details Patient Name: [name] Date of Service: 12/04/2015 8:00 AM Medical Record Number: [id number] Patient Account Number: [id number] Date of Birth/Sex: Mar 28, 1929 (82 y.o. Female) Treating RN: Cornell Barman Primary Care Physician: SYSTEM, PCP Other Clinician: Referring Physician: Treating Physician/Extender: Frann Rider in Treatment: 0 Edema Assessment Assessed: [Left: No] [Right: No] E[Left: dema] [Right: :] Calf Left: Right: Point of Measurement: 28 cm From Medial Instep 38 cm cm Ankle Left: Right: Point of Measurement: 8 cm From Medial Instep 25.5 cm cm Vascular Assessment Pulses: Posterior Tibial Dorsalis Pedis Palpable: [Left:Yes] [Right:Yes] Doppler: [Left:Monophasic] [Right:Monophasic] Extremity colors, hair growth, and conditions: Extremity Color: [Left:Normal] [Right:Pale] Hair Growth on Extremity: [Left:Yes] [Right:Yes] Temperature of Extremity: [Left:Warm] [Right:Warm] Capillary Refill: [Left:> 3 seconds] [Right:> 3 seconds] Blood Pressure: Brachial: [Left:100] Dorsalis Pedis: 156 [Left:Dorsalis Pedis:] Ankle: Posterior Tibial: 90 [Left:Posterior Tibial: 1.56] Toe Nail Assessment Left: Right: Thick: No No Discolored: No No Deformed: No No Improper Length and Hygiene: No No Electronic  Signature(sSHAKEYA, KERKMAN ([id number]) Signed: 12/05/2015 4:33:07 PM By: Gretta Cool, RN, BSN, Kim RN, BSN Entered By: Gretta Cool, RN, BSN, Kim on 12/04/2015 08:38:29 [name] ([id number]) -------------------------------------------------------------------------------- Multi Wound Chart Details Patient Name: [name] Date of Service: 12/04/2015 8:00 AM Medical Record Number: [id number] Patient Account Number: [id number] Date of Birth/Sex: 1929-01-23 (82 y.o. Female) Treating RN: Cornell Barman Primary Care Physician: SYSTEM, PCP Other Clinician: Referring Physician: Treating Physician/Extender: Frann Rider in Treatment: 0 Vital Signs Height(in): 64 Pulse(bpm): 78 Weight(lbs): 153 Blood Pressure 123/57 (mmHg): Body Mass Index(BMI): 26  Temperature(F): 98.7 Respiratory Rate (breaths/min): Photos: [1:No Photos] [2:No Photos] [3:No Photos] Wound Location: [1:Right Elbow] [2:Left Calcaneus] [3:Left Foot] Wounding Event: [1:Gradually Appeared] [2:Gradually Appeared] [3:Gradually Appeared] Primary Etiology: [1:To be determined] [2:To be determined] [3:To be determined] Date Acquired: [1:11/14/2015] [2:10/04/2015] [3:10/04/2015] Weeks of Treatment: [1:0] [2:0] [3:0] Wound Status: [1:Open] [2:Open] [3:Open] Measurements L x W x D 4x3.5x1.7 [2:1x2.5x0.1] [3:1.5x1.5x0.1] (cm) Area (cm) : [1:10.996] [2:1.963] [3:1.767] Volume (cm) : [1:18.692] [2:0.196] [3:0.177] % Reduction in Area: [1:0.00%] [2:N/A] [3:N/A] % Reduction in Volume: 0.00% [2:N/A] [3:N/A] Classification: [1:Partial Thickness] [2:Partial Thickness] [3:Partial Thickness] Exudate Amount: [1:Large] [2:None Present] [3:None Present] Exudate Type: [1:Serosanguineous] [2:N/A] [3:N/A] Exudate Color: [1:red, brown] [2:N/A] [3:N/A] Foul Odor After [1:Yes] [2:No] [3:No] Cleansing: Odor Anticipated Due to No [2:N/A] [3:N/A] Product Use: Wound Margin: [1:Flat and Intact] [2:Flat and Intact] [3:Flat and Intact] Granulation Amount:  [1:None Present (0%)] [2:None Present (0%)] [3:None Present (0%)] Necrotic Amount: [1:Large (67-100%)] [2:Large (67-100%)] [3:Large (67-100%)] Necrotic Tissue: [1:Eschar, Adherent Slough] [2:Eschar] [3:Eschar] Exposed Structures: [1:Fascia: No Fat: No Tendon: No Muscle: No Joint: No] [2:Fascia: No Fat: No Tendon: No Muscle: No Joint: No] [3:N/A] Bone: No Bone: No Limited to Skin Limited to Skin Breakdown Breakdown Epithelialization: N/A None None Periwound Skin Texture: Edema: Yes Edema: Yes No Abnormalities Noted Periwound Skin Moist: Yes No Abnormalities Noted No Abnormalities Noted Moisture: Periwound Skin Color: Erythema: Yes No Abnormalities Noted No Abnormalities Noted Erythema Location: Circumferential N/A N/A Temperature: No Abnormality No Abnormality No Abnormality Tenderness on Yes Yes Yes Palpation: Wound Preparation: Ulcer Cleansing: Ulcer Cleansing: Ulcer Cleansing: Rinsed/Irrigated with Rinsed/Irrigated with Rinsed/Irrigated with Saline Saline Saline Topical Anesthetic Topical Anesthetic Topical Anesthetic Applied: Other: lidocaine Applied: Other: lidocaine Applied: Other: lidocaine 4% 4% 4% Treatment Notes Electronic Signature(s) Signed: 12/05/2015 4:33:07 PM By: Gretta Cool, RN, BSN, Kim RN, BSN Entered By: Gretta Cool, RN, BSN, Kim on 12/04/2015 08:52:38 [name] ([id number]) -------------------------------------------------------------------------------- Multi-Disciplinary Care Plan Details Patient Name: [name] Date of Service: 12/04/2015 8:00 AM Medical Record Number: [id number] Patient Account Number: [id number] Date of Birth/Sex: 22-Feb-1929 (82 y.o. Female) Treating RN: Cornell Barman Primary Care Physician: SYSTEM, PCP Other Clinician: Referring Physician: Treating Physician/Extender: Frann Rider in Treatment: 0 Active Inactive Abuse / Safety / Falls / Self Care Management Nursing Diagnoses: Potential for falls Goals: Patient will remain injury  free Date Initiated: 12/04/2015 Goal Status: Active Interventions: Assess fall risk on admission and as needed Notes: Nutrition Nursing Diagnoses: Potential for alteratiion in Nutrition/Potential for imbalanced nutrition Goals: Patient/caregiver agrees to and verbalizes understanding of need to obtain nutritional consultation Date Initiated: 12/04/2015 Goal Status: Active Interventions: Provide education on nutrition Notes: Orientation to the Wound Care Program Nursing Diagnoses: Knowledge deficit related to the wound healing center program Goals: Patient/caregiver will verbalize understanding of the [facility] Date Initiated: 12/04/2015 MEILAH, DELROSARIO ([id number]) Goal Status: Active Interventions: Provide education on orientation to the wound center Notes: Venous Leg Ulcer Nursing Diagnoses: Actual venous Insuffiency (use after diagnosis is confirmed) Goals: Verify adequate tissue perfusion prior to therapeutic compression application Date Initiated: 12/04/2015 Goal Status: Active Interventions: Provide education on venous insufficiency Notes: Wound/Skin Impairment Nursing Diagnoses: Impaired tissue integrity Goals: Ulcer/skin breakdown will heal within 14 weeks Date Initiated: 12/04/2015 Goal Status: Active Interventions: Assess patient/caregiver ability to obtain necessary supplies Notes: Electronic Signature(s) Signed: 12/05/2015 4:33:07 PM By: Gretta Cool, RN, BSN, Kim RN, BSN Entered By: Gretta Cool, RN, BSN, Kim on 12/04/2015 08:52:26 Kleppe, Jana Half ([id number]) -------------------------------------------------------------------------------- Non-Wound Condition Assessment Details Patient Name: [name] Date of Service: 12/04/2015 8:00 AM  Medical Record Number: [id number] Patient Account Number: [id number] Date of Birth/Sex: [birth year] (82 y.o. Female) Treating RN: Cornell Barman Primary Care Physician: SYSTEM, PCP Other Clinician: Referring Physician: Treating  Physician/Extender: Frann Rider in Treatment: 0 Non-Wound Condition: Condition: Suspected Deep Tissue Injury Location: Foot Side: Left Photos Periwound Skin Texture Texture Color No Abnormalities Noted: No No Abnormalities Noted: No Callus: No Atrophie Blanche: No Crepitus: No Cyanosis: No Excoriation: No Ecchymosis: No Fluctuance: No Erythema: No Friable: No Hemosiderin Staining: No Induration: No Mottled: No Localized Edema: No Pallor: No Rash: No Rubor: No Scarring: No Moisture No Abnormalities Noted: No Dry / Scaly: Yes Maceration: No Moist: No Notes Left Heel and medial 1st met head; Electronic Signature(s) Signed: 12/05/2015 4:33:07 PM By: Gretta Cool, RN, BSN, Kim RN, BSN [place], Ilithyia ([id number]) Entered By: Gretta Cool, RN, BSN, Kim on 12/04/2015 11:46:59 [name] ([id number]) -------------------------------------------------------------------------------- Pain Assessment Details Patient Name: [name] Date of Service: 12/04/2015 8:00 AM Medical Record Number: [id number] Patient Account Number: [id number] Date of Birth/Sex: [birth year] (82 y.o. Female) Treating RN: Cornell Barman Primary Care Physician: SYSTEM, PCP Other Clinician: Referring Physician: Treating Physician/Extender: Frann Rider in Treatment: 0 Active Problems Location of Pain Severity and Description of Pain Patient Has Paino Patient Unable to Respond Site Locations Pain Management and Medication Current Pain Management: Electronic Signature(s) Signed: 12/05/2015 4:33:07 PM By: Gretta Cool, RN, BSN, Kim RN, BSN Entered By: Gretta Cool, RN, BSN, Kim on 12/04/2015 08:17:21 [name] ([id number]) -------------------------------------------------------------------------------- Patient/Caregiver Education Details Patient Name: [name] Date of Service: 12/04/2015 8:00 AM Medical Record Number: [id number] Patient Account Number: [id number] Date of Birth/Gender: 09-20-1928 (82 y.o.  Female) Treating RN: Cornell Barman Primary Care Physician: SYSTEM, PCP Other Clinician: Referring Physician: Treating Physician/Extender: Frann Rider in Treatment: 0 Education Assessment Education Provided To: Patient Education Topics Provided Welcome To [facility]: Handouts: Welcome To [facility] Methods: Demonstration, Explain/Verbal Responses: State content correctly Electronic Signature(s) Signed: 12/05/2015 4:33:07 PM By: Gretta Cool, RN, BSN, Kim RN, BSN Entered By: Gretta Cool, RN, BSN, Kim on 12/04/2015 09:27:48 [name] ([id number]) -------------------------------------------------------------------------------- Wound Assessment Details Patient Name: [name] Date of Service: 12/04/2015 8:00 AM Medical Record Number: [id number] Patient Account Number: [id number] Date of Birth/Sex: June 21, 1929 (82 y.o. Female) Treating RN: Cornell Barman Primary Care Physician: SYSTEM, PCP Other Clinician: Referring Physician: Treating Physician/Extender: Frann Rider in Treatment: 0 Wound Status Wound Number: 1 Primary Etiology: To be determined Wound Location: Right Elbow Wound Status: Open Wounding Event: Gradually Appeared Date Acquired: 11/14/2015 Weeks Of Treatment: 0 Clustered Wound: No Photos Photo Uploaded By: Gretta Cool, RN, BSN, Kim on 12/04/2015 11:45:54 Wound Measurements Length: (cm) 4 Width: (cm) 3.5 Depth: (cm) 1.7 Area: (cm) 10.996 Volume: (cm) 18.692 % Reduction in Area: 0% % Reduction in Volume: 0% Tunneling: No Undermining: No Wound Description Classification: Partial Thickness Wound Margin: Flat and Intact Exudate Amount: Large Exudate Type: Serosanguineous Exudate Color: red, brown Foul Odor After Cleansing: Yes Due to Product Use: No Wound Bed Granulation Amount: None Present (0%) Exposed Structure Necrotic Amount: Large (67-100%) Fascia Exposed: No Necrotic Quality: Eschar, Adherent Slough Fat Layer Exposed: No Tendon  Exposed: No Lobue, Iracema ([id number]) Muscle Exposed: No Joint Exposed: No Bone Exposed: No Limited to Skin Breakdown Periwound Skin Texture Texture Color No Abnormalities Noted: No No Abnormalities Noted: No Localized Edema: Yes Erythema: Yes Erythema Location: Circumferential Moisture No Abnormalities Noted: No Temperature / Pain Moist: Yes Temperature: No Abnormality Tenderness on Palpation: Yes Wound Preparation Ulcer Cleansing: Rinsed/Irrigated with Saline Topical Anesthetic Applied:  Other: lidocaine 4%, Treatment Notes Wound #1 (Right Elbow) 1. Cleansed with: Clean wound with Normal Saline 2. Anesthetic Topical Lidocaine 4% cream to wound bed prior to debridement 4. Dressing Applied: Santyl Ointment 5. Secondary Dressing Applied Bordered Foam Dressing Electronic Signature(s) Signed: 12/05/2015 4:33:07 PM By: Gretta Cool, RN, BSN, Kim RN, BSN Entered By: Gretta Cool, RN, BSN, Kim on 12/04/2015 08:38:41 [name] ([id number]) -------------------------------------------------------------------------------- [place] Details Patient Name: [name] Date of Service: 12/04/2015 8:00 AM Medical Record Number: [id number] Patient Account Number: [id number] Date of Birth/Sex: [birth year] (82 y.o. Female) Treating RN: Cornell Barman Primary Care Physician: SYSTEM, PCP Other Clinician: Referring Physician: Treating Physician/Extender: Frann Rider in Treatment: 0 Vital Signs Time Taken: 08:17 Temperature (F): 98.7 Height (in): 64 Pulse (bpm): 78 Weight (lbs): 153 Blood Pressure (mmHg): 123/57 Body Mass Index (BMI): 26.3 Reference Range: 80 - 120 mg / dl Electronic Signature(s) Signed: 12/05/2015 4:33:07 PM By: Gretta Cool, RN, BSN, Kim RN, BSN Entered By: Gretta Cool, RN, BSN, Kim on 12/04/2015 14:43:60

## 2015-12-06 NOTE — Progress Notes (Signed)
Kirk, Anita ([id number]) Visit Report for 6/[place], WARRINERuse/Suicide Risk Screen Details Patient Name: [name], [name] Date of Service: 12/04/2015 8:00 AM Medical Record Number: [id number] Patient Account Number: [id number] Date of Birth/Sex: May 22, 1929 (82 y.o. Female) Treating RN: Huel Coventry Primary Care Physician: SYSTEM, PCP Other Clinician: Referring Physician: Treating Physician/Extender: Rudene Re in Treatment: 0 Abuse/Suicide Risk Screen Items Answer ABUSE/SUICIDE RISK SCREEN: Has anyone close to you tried to hurt or harm you recentlyo No Do you feel uncomfortable with anyone in your familyo No Has anyone forced you do things that you didnot want to doo No Do you have any thoughts of harming yourselfo No Patient displays signs or symptoms of abuse and/or neglect. No Electronic Signature(s) Signed: 12/05/2015 4:33:07 PM By: Elliot Gurney, RN, BSN, Kim RN, BSN Entered By: Elliot Gurney, RN, BSN, Kim on 12/04/2015 08:48:42 [name] ([id number]) -------------------------------------------------------------------------------- Activities of Daily Living Details Patient Name: [name] Date of Service: 12/04/2015 8:00 AM Medical Record Number: [id number] Patient Account Number: [id number] Date of Birth/Sex: [birth year] (82 y.o. Female) Treating RN: Huel Coventry Primary Care Physician: SYSTEM, PCP Other Clinician: Referring Physician: Treating Physician/Extender: Rudene Re in Treatment: 0 Activities of Daily Living Items Answer Activities of Daily Living (Please select one for each item) Drive Automobile Not Able Take Medications Not Able Use Telephone Not Able Care for Appearance Not Able Use Toilet Not Able Mady Haagensen / Shower Not Able Dress Self Not Able Feed Self Not Able Walk Not Able Get In / Out Bed Not Able Housework Not Able Prepare Meals Not Able Handle Money Not Able Shop for Self Not Able Electronic Signature(s) Signed: 12/05/2015 4:33:07 PM By: Elliot Gurney, RN, BSN, Kim  RN, BSN Entered By: Elliot Gurney, RN, BSN, Kim on 12/04/2015 08:49:10 [name] ([id number]) -------------------------------------------------------------------------------- Education Assessment Details Patient Name: [name] Date of Service: 12/04/2015 8:00 AM Medical Record Number: [id number] Patient Account Number: [id number] Date of Birth/Sex: 16-May-1929 (82 y.o. Female) Treating RN: Huel Coventry Primary Care Physician: SYSTEM, PCP Other Clinician: Referring Physician: Treating Physician/Extender: Rudene Re in Treatment: 0 Primary Learner Assessed: Patient Learning Preferences/Education Level/Primary Language Learning Preference: Explanation, Demonstration Highest Education Level: Grade School Preferred Language: English Cognitive Barrier Assessment/Beliefs Language Barrier: No Translator Needed: No Memory Deficit: Yes Emotional Barrier: No Cultural/Religious Beliefs Affecting Medical No Care: Physical Barrier Assessment Impaired Vision: Yes Glasses Impaired Hearing: Yes Complete Loss Knowledge/Comprehension Assessment Knowledge Level: Low Comprehension Level: Low Ability to understand written Low instructions: Ability to understand verbal Low instructions: Motivation Assessment Anxiety Level: Calm Cooperation: Cooperative Education Importance: Acknowledges Need Interest in Health Problems: Asks Questions Perception: Coherent Willingness to Engage in Self- High Management Activities: Readiness to Engage in Self- High Management Activities: Electronic Signature(s) Signed: 12/05/2015 4:33:07 PM By: Elliot Gurney, RN, BSN, Kim RN, BSN Alderson, Shanecia ([id number]) Entered By: Elliot Gurney, RN, BSN, Kim on 12/04/2015 08:50:08 [name] ([id number]) -------------------------------------------------------------------------------- Fall Risk Assessment Details Patient Name: [name] Date of Service: 12/04/2015 8:00 AM Medical Record Number: [id number] Patient Account Number:  [id number] Date of Birth/Sex: 1928/09/01 (82 y.o. Female) Treating RN: Huel Coventry Primary Care Physician: SYSTEM, PCP Other Clinician: Referring Physician: Treating Physician/Extender: Rudene Re in Treatment: 0 Fall Risk Assessment Items Have you had 2 or more falls in the last 12 monthso 0 Yes Have you had any fall that resulted in injury in the last 12 monthso 0 No FALL RISK ASSESSMENT: History of falling - immediate or within 3 months 25 Yes Secondary diagnosis 0 No Ambulatory aid None/bed rest/wheelchair/nurse 0 Yes Crutches/cane/walker 0 No Furniture 0  No IV Access/Saline Lock 0 No Gait/Training Normal/bed rest/immobile 0 No Weak 10 Yes Impaired 0 No Mental Status Oriented to own ability 0 Yes Electronic Signature(s) Signed: 12/05/2015 4:33:07 PM By: Elliot GurneyWoody, RN, BSN, Kim RN, BSN Entered By: Elliot GurneyWoody, RN, BSN, Kim on 12/04/2015 08:50:58 [name] ([id number]) -------------------------------------------------------------------------------- Foot Assessment Details Patient Name: [name] Date of Service: 12/04/2015 8:00 AM Medical Record Number: [id number] Patient Account Number: [id number] Date of Birth/Sex: 04-19-1929 (82 y.o. Female) Treating RN: Huel CoventryWoody, Kim Primary Care Physician: SYSTEM, PCP Other Clinician: Referring Physician: Treating Physician/Extender: Rudene ReBritto, Errol Weeks in Treatment: 0 Foot Assessment Items [x]  Unable to perform due to altered mental status Site Locations + = Sensation present, - = Sensation absent, C = Callus, U = Ulcer R = Redness, W = Warmth, M = Maceration, PU = Pre-ulcerative lesion F = Fissure, S = Swelling, D = Dryness Assessment Right: Left: Other Deformity: No No Prior Foot Ulcer: No No Prior Amputation: No No Charcot Joint: No No Ambulatory Status: Gait: Electronic Signature(s) Signed: 12/05/2015 4:33:07 PM By: Elliot GurneyWoody, RN, BSN, Kim RN, BSN Entered By: Elliot GurneyWoody, RN, BSN, Kim on 12/04/2015 08:51:24 Anita BubaJOBE, Kalis  ([id number]) -------------------------------------------------------------------------------- Nutrition Risk Assessment Details Patient Name: Anita BubaJOBE, Brindy Date of Service: 12/04/2015 8:00 AM Medical Record Number: [id number] Patient Account Number: [id number] Date of Birth/Sex: 04-19-1929 (82 y.o. Female) Treating RN: Huel CoventryWoody, Kim Primary Care Physician: SYSTEM, PCP Other Clinician: Referring Physician: Treating Physician/Extender: Rudene ReBritto, Errol Weeks in Treatment: 0 Height (in): 64 Weight (lbs): 153 Body Mass Index (BMI): 26.3 Nutrition Risk Assessment Items NUTRITION RISK SCREEN: I have an illness or condition that made me change the kind and/or 0 No amount of food I eat I eat fewer than two meals per day 0 No I eat few fruits and vegetables, or milk products 0 No I have three or more drinks of beer, liquor or wine almost every day 0 No I have tooth or mouth problems that make it hard for me to eat 2 Yes I don't always have enough money to buy the food I need 0 No I eat alone most of the time 0 No I take three or more different prescribed or over-the-counter drugs a 1 Yes day Without wanting to, I have lost or gained 10 pounds in the last six 0 No months I am not always physically able to shop, cook and/or feed myself 0 No Nutrition Protocols Good Risk Protocol Provide education on Moderate Risk Protocol 0 nutrition Electronic Signature(s) Signed: 12/05/2015 4:33:07 PM By: Elliot GurneyWoody, RN, BSN, Kim RN, BSN Entered By: Elliot GurneyWoody, RN, BSN, Kim on 12/04/2015 08:51:17

## 2015-12-11 ENCOUNTER — Encounter: Payer: Medicare Other | Admitting: Surgery

## 2015-12-11 DIAGNOSIS — S41101A Unspecified open wound of right upper arm, initial encounter: Secondary | ICD-10-CM | POA: Diagnosis not present

## 2015-12-11 NOTE — Progress Notes (Addendum)
ROSIO, [name] ([id number]) Visit Report for 12/11/2015 Chief Complaint Document Details Patient Name: [name], [name] 12/11/2015 10:00 Date of Service: AM Medical Record [id number] Number: Patient Account Number: [id number] [birth year] (82 y.o. Treating RN: Huel Coventry Date of Birth/Sex: Female) Other Clinician: Primary Care Physician: SYSTEM, PCP Treating Evlyn Kanner Referring Physician: Physician/Extender: Tania Ade in Treatment: 1 Information Obtained from: Patient Chief Complaint Patient seen for complaints of Non-Healing Wound to the right upper arm laterally near the elbow for about a month. She also has some skin changes on her left heel. Electronic Signature(s) Signed: 12/11/2015 11:19:11 AM By: Evlyn Kanner MD, FACS Entered By: Evlyn Kanner on 12/11/2015 11:19:11 [name] ([id number]) -------------------------------------------------------------------------------- Debridement Details Patient Name: [name], [name] 12/11/2015 10:00 Date of Service: AM Medical Record [id number] Number: Patient Account Number: [id number] [birth year] (82 y.o. Treating RN: Huel Coventry Date of Birth/Sex: Female) Other Clinician: Primary Care Physician: SYSTEM, PCP Treating Ayham Word Referring Physician: Physician/Extender: Tania Ade in Treatment: 1 Debridement Performed for Wound #1 Right Elbow Assessment: Performed By: Physician Evlyn Kanner, MD Debridement: Debridement Pre-procedure Yes Verification/Time Out Taken: Start Time: 10:40 Pain Control: Other : lidociane 4% Level: Skin/Subcutaneous Tissue Total Area Debrided (L x 3.6 (cm) x 2.2 (cm) = 7.92 (cm) W): Tissue and other Non-Viable, Eschar, Fat, Fibrin/Slough, Subcutaneous material debrided: Instrument: Forceps, Scissors Bleeding: None End Time: 10:45 Procedural Pain: 0 Post Procedural Pain: 0 Response to Treatment: Procedure was tolerated well Post Debridement Measurements of Total Wound Length: (cm) 3.6 Width: (cm)  2.2 Depth: (cm) 1.6 Volume: (cm) 9.953 Post Procedure Diagnosis Same as Pre-procedure Electronic Signature(s) Signed: 12/11/2015 11:19:04 AM By: Evlyn Kanner MD, FACS Signed: 12/11/2015 5:25:15 PM By: Elliot Gurney RN, BSN, Kim RN, BSN Entered By: Evlyn Kanner on 12/11/2015 11:19:04 [name] ([id number]) -------------------------------------------------------------------------------- HPI Details Patient Name: [name], [name] 12/11/2015 10:00 Date of Service: AM Medical Record [id number] Number: Patient Account Number: [id number] 04-23-1929 (82 y.o. Treating RN: Date of Birth/Sex: Female) Other Clinician: Primary Care Physician: SYSTEM, PCP Treating Everton Bertha Referring Physician: Physician/Extender: Tania Ade in Treatment: 1 History of Present Illness Location: wound on her right arm laterally near the elbow Quality: Patient reports experiencing a dull pain to affected area(s). Severity: Patient states wound are getting worse. Duration: Patient has had the wound for < 4 weeks prior to presenting for treatment Timing: Pain in wound is Intermittent (comes and goes Context: The wound appeared gradually over time Modifying Factors: Other treatment(s) tried include:local care with dressing changes and has recently been on clindamycin Associated Signs and Symptoms: Patient reports having increase swelling all over the body including upper arms and legs. HPI Description: 82 year old patient was been referred to [place] for a ulcerated area on her right elbow. She has a past medical history of CHF, atrial fibrillation, hypertension, gait instability, bilateral lower extremity edema, morbid obesity, altered mental status. She has never been a smoker. her cardiologist is Dr. Julien Nordmann, who last saw her in March 2017.he has been seeing her with a history of hypertension, gait instability, cognitive impairment, A. fib with RVR and chronic systolic CHF. she is on Eliquis 5 mg twice a [name] and for  the bilateral lower extremity lymphedema he had recommended elevation and compression hose. 12/11/2015 -- x-ray of the right elbow shows no evidence of fracture or bony pathology and the joint spaces are intact. The patient has held her anticoagulation for the last 72 hours Electronic Signature(s) Signed: 12/11/2015 11:19:31 AM By: Evlyn Kanner MD, FACS Previous Signature: 12/11/2015 10:24:45 AM Version By: Evlyn Kanner MD, FACS Entered  By: Evlyn Kanner on 12/11/2015 11:19:31 [name] ([id number]) -------------------------------------------------------------------------------- Physical Exam Details Patient Name: [name], [name] 12/11/2015 10:00 Date of Service: AM Medical Record [id number] Number: Patient Account Number: [id number] 11/16/1928 (82 y.o. Treating RN: Huel Coventry Date of Birth/Sex: Female) Other Clinician: Primary Care Physician: SYSTEM, PCP Treating Evlyn Kanner Referring Physician: Physician/Extender: Tania Ade in Treatment: 1 Constitutional . Pulse regular. Respirations normal and unlabored. Afebrile. . Eyes Nonicteric. Reactive to light. Ears, Nose, Mouth, and Throat Lips, teeth, and gums WNL.Marland Kitchen Moist mucosa without lesions. Neck supple and nontender. No palpable supraclavicular or cervical adenopathy. Normal sized without goiter. Respiratory WNL. No retractions.. Breath sounds WNL, No rubs, rales, rhonchi, or wheeze.. Cardiovascular Heart rhythm and rate regular, no murmur or gallop.. Pedal Pulses WNL. No clubbing, cyanosis or edema. Lymphatic No adneopathy. No adenopathy. No adenopathy. Musculoskeletal Adexa without tenderness or enlargement.. Digits and nails w/o clubbing, cyanosis, infection, petechiae, ischemia, or inflammatory conditions.. Integumentary (Hair, Skin) No suspicious lesions. No crepitus or fluctuance. No peri-wound warmth or erythema. No masses.Marland Kitchen Psychiatric Judgement and insight Intact.. No evidence of depression, anxiety, or  agitation.. Notes the right arm wound has been sharply debrided with a forcep and scissors and all the skin and subcutaneous tissue and necrotic debris was completely removed. There was no bleeding and no evidence of any exposed deeper structures. Her left heel continues to have an unstageable pressure injury with no surrounding cellulitis. Electronic Signature(s) Signed: 12/11/2015 11:20:28 AM By: Evlyn Kanner MD, FACS Entered By: Evlyn Kanner on 12/11/2015 11:20:27 [name] ([id number]) -------------------------------------------------------------------------------- Physician Orders Details Patient Name: [name], [name] 12/11/2015 10:00 Date of Service: AM Medical Record [id number] Number: Patient Account Number: [id number] [birth year] (82 y.o. Treating RN: Huel Coventry Date of Birth/Sex: Female) Other Clinician: Primary Care Physician: SYSTEM, PCP Treating Evlyn Kanner Referring Physician: Physician/Extender: Tania Ade in Treatment: 1 Verbal / Phone Orders: Yes Clinician: Huel Coventry Read Back and Verified: Yes Diagnosis Coding Wound Cleansing Wound #1 Right Elbow o Clean wound with Normal Saline. Wound #2 Right Calcaneus o Clean wound with Normal Saline. Wound #3 Right Hand - Dorsum o Clean wound with Normal Saline. Wound #4 Right Forearm o Clean wound with Normal Saline. Anesthetic Wound #1 Right Elbow o Topical Lidocaine 4% cream applied to wound bed prior to debridement Primary Wound Dressing Wound #1 Right Elbow o Santyl Ointment Wound #3 Right Hand - Dorsum o Mepitel One Wound #4 Right Forearm o Mepitel One Wound #2 Right Calcaneus o Other: - betadine paint Secondary Dressing Wound #1 Right Elbow o Dry Gauze o Boardered Foam Dressing Harbin, Layla ([id number]) Dressing Change Frequency Wound #1 Right Elbow o Change dressing every [name]. Wound #2 Right Calcaneus o Change dressing every [name]. Wound #3 Right Hand - Dorsum o Change  dressing every week - Leave dressing in place Wound #4 Right Forearm o Change dressing every week - Leave dressing in place Follow-up Appointments Wound #1 Right Elbow o Return Appointment in 1 week. Wound #2 Right Calcaneus o Return Appointment in 1 week. Wound #3 Right Hand - Dorsum o Return Appointment in 1 week. Wound #4 Right Forearm o Return Appointment in 1 week. Edema Control o Elevate legs to the level of the heart and pump ankles as often as possible - Float heels, elevate legs on a couple pillows when in bed. Sage boots. Additional Orders / Instructions Wound #1 Right Elbow o Increase protein intake. Home Health Wound #1 Right Elbow o Continue Home [facility] Visits - [facility]- Change elbow dressing daily o Home Health Nurse  may visit PRN to address patientos wound care needs. o FACE TO FACE ENCOUNTER: MEDICARE and MEDICAID PATIENTS: I certify that this patient is under my care and that I had a face-to-face encounter that meets the physician face-to-face encounter requirements with this patient on this date. The encounter with the patient was in whole or in part for the following MEDICAL CONDITION: (primary reason for Home Healthcare) MEDICAL NECESSITY: I certify, that based on my findings, NURSING services are a medically necessary home health service. HOME BOUND STATUS: I certify that my clinical findings support that this patient is homebound (i.e., Due to illness or injury, pt requires aid of supportive devices such as crutches, cane, wheelchairs, walkers, the use of special transportation or the assistance of another person to leave their place of residence. There is a normal inability to leave the home and doing so requires considerable and taxing effort. GIOVANA, FACIANE ([id number]) absences are for medical reasons / religious services and are infrequent or of short duration when for other reasons). o If current dressing causes regression in  wound condition, may D/C ordered dressing product/s and apply Normal Saline Moist Dressing daily until next Wound Healing Center / Other MD appointment. Notify Wound Healing Center of regression in wound condition at [phone]. o Please direct any NON-WOUND related issues/requests for orders to patient's Primary Care Physician Medications-please add to medication list. Wound #1 Right Elbow o Santyl Enzymatic Ointment o Other: - Resume Eliquis as prescribed. Electronic Signature(s) Signed: 12/11/2015 4:16:32 PM By: Evlyn Kanner MD, FACS Signed: 12/11/2015 5:25:15 PM By: Elliot Gurney RN, BSN, Kim RN, BSN Entered By: Elliot Gurney, RN, BSN, Kim on 12/11/2015 10:57:31 ENRICA, CORLISS ([id number]) -------------------------------------------------------------------------------- Problem List Details Patient Name: [name], [name] 12/11/2015 10:00 Date of Service: AM Medical Record [id number] Number: Patient Account Number: [id number] [birth year] (82 y.o. Treating RN: Huel Coventry Date of Birth/Sex: Female) Other Clinician: Primary Care Physician: SYSTEM, PCP Treating Evlyn Kanner Referring Physician: Physician/Extender: Tania Ade in Treatment: 1 Active Problems ICD-10 Encounter Code Description Active Date Diagnosis S41.101A Unspecified open wound of right upper arm, initial 12/04/2015 Yes encounter L89.629 Pressure ulcer of left heel, unspecified stage 12/04/2015 Yes I50.40 Unspecified combined systolic (congestive) and diastolic 12/04/2015 Yes (congestive) heart failure I89.0 Lymphedema, not elsewhere classified 12/04/2015 Yes Inactive Problems Resolved Problems Electronic Signature(s) Signed: 12/11/2015 11:18:44 AM By: Evlyn Kanner MD, FACS Entered By: Evlyn Kanner on 12/11/2015 11:18:43 [name] ([id number]) -------------------------------------------------------------------------------- Progress Note Details Patient Name: [name], [name] 12/11/2015 10:00 Date of Service: AM Medical  Record [id number] Number: Patient Account Number: [id number] 07/11/1928 (82 y.o. Treating RN: Huel Coventry Date of Birth/Sex: Female) Other Clinician: Primary Care Physician: SYSTEM, PCP Treating Evlyn Kanner Referring Physician: Physician/Extender: Tania Ade in Treatment: 1 Subjective Chief Complaint Information obtained from Patient Patient seen for complaints of Non-Healing Wound to the right upper arm laterally near the elbow for about a month. She also has some skin changes on her left heel. History of Present Illness (HPI) The following HPI elements were documented for the patient's wound: Location: wound on her right arm laterally near the elbow Quality: Patient reports experiencing a dull pain to affected area(s). Severity: Patient states wound are getting worse. Duration: Patient has had the wound for < 4 weeks prior to presenting for treatment Timing: Pain in wound is Intermittent (comes and goes Context: The wound appeared gradually over time Modifying Factors: Other treatment(s) tried include:local care with dressing changes and has recently been on clindamycin Associated Signs and Symptoms: Patient reports having increase swelling all over the  body including upper arms and legs. 82 year old patient was been referred to [place] for a ulcerated area on her right elbow. She has a past medical history of CHF, atrial fibrillation, hypertension, gait instability, bilateral lower extremity edema, morbid obesity, altered mental status. She has never been a smoker. her cardiologist is Dr. Julien Nordmann, who last saw her in March 2017.he has been seeing her with a history of hypertension, gait instability, cognitive impairment, A. fib with RVR and chronic systolic CHF. she is on Eliquis 5 mg twice a [name] and for the bilateral lower extremity lymphedema he had recommended elevation and compression hose. 12/11/2015 -- x-ray of the right elbow shows no evidence of fracture or bony pathology  and the joint spaces are intact. The patient has held her anticoagulation for the last 72 hours Objective [name], [name] ([id number]) Constitutional Pulse regular. Respirations normal and unlabored. Afebrile. Vitals Time Taken: 10:30 AM, Height: 64 in, Weight: 153 lbs, BMI: 26.3, Temperature: 97.2 F, Pulse: 85 bpm, Respiratory Rate: 18 breaths/min, Blood Pressure: 114/50 mmHg. Eyes Nonicteric. Reactive to light. Ears, Nose, Mouth, and Throat Lips, teeth, and gums WNL.Marland Kitchen Moist mucosa without lesions. Neck supple and nontender. No palpable supraclavicular or cervical adenopathy. Normal sized without goiter. Respiratory WNL. No retractions.. Breath sounds WNL, No rubs, rales, rhonchi, or wheeze.. Cardiovascular Heart rhythm and rate regular, no murmur or gallop.. Pedal Pulses WNL. No clubbing, cyanosis or edema. Lymphatic No adneopathy. No adenopathy. No adenopathy. Musculoskeletal Adexa without tenderness or enlargement.. Digits and nails w/o clubbing, cyanosis, infection, petechiae, ischemia, or inflammatory conditions.Marland Kitchen Psychiatric Judgement and insight Intact.. No evidence of depression, anxiety, or agitation.. General Notes: the right arm wound has been sharply debrided with a forcep and scissors and all the skin and subcutaneous tissue and necrotic debris was completely removed. There was no bleeding and no evidence of any exposed deeper structures. Her left heel continues to have an unstageable pressure injury with no surrounding cellulitis. Integumentary (Hair, Skin) No suspicious lesions. No crepitus or fluctuance. No peri-wound warmth or erythema. No masses.. Wound #1 status is Open. Original cause of wound was Gradually Appeared. The wound is located on the Right Elbow. The wound measures 3.6cm length x 2.2cm width x 1.4cm depth; 6.22cm^2 area and 8.708cm^3 volume. The wound is limited to skin breakdown. There is a large amount of serosanguineous drainage noted. The wound  margin is flat and intact. There is no granulation within the wound bed. There is a large (67-100%) amount of necrotic tissue within the wound bed including Eschar and Adherent Slough. The periwound skin appearance exhibited: Localized Edema, Moist, Erythema. The surrounding wound skin color is noted with erythema which is circumferential. Periwound temperature was noted as No Abnormality. The periwound has tenderness on palpation. [name], [name] ([id number]) Wound #2 status is Open. Original cause of wound was Gradually Appeared. The wound is located on the Right Calcaneus. The wound measures 2cm length x 3cm width x 0.1cm depth; 4.712cm^2 area and 0.471cm^3 volume. The wound is limited to skin breakdown. There is a none present amount of drainage noted. The wound margin is flat and intact. There is no granulation within the wound bed. There is a large (67-100%) amount of necrotic tissue within the wound bed including Eschar. The periwound skin appearance exhibited: Dry/Scaly. The periwound skin appearance did not exhibit: Callus, Crepitus, Excoriation, Fluctuance, Friable, Induration, Localized Edema, Rash, Scarring, Maceration, Moist, Atrophie Blanche, Cyanosis, Ecchymosis, Hemosiderin Staining, Mottled, Pallor, Rubor, Erythema. Wound #3 status is Open. Original cause of wound was  Trauma. The wound is located on the Right Forearm. The wound measures 2cm length x 0.5cm width x 0.1cm depth; 0.785cm^2 area and 0.079cm^3 volume. The wound is limited to skin breakdown. There is no tunneling or undermining noted. There is a small amount of serous drainage noted. The wound margin is flat and intact. There is small (1-33%) pink granulation within the wound bed. There is no necrotic tissue within the wound bed. The periwound skin appearance exhibited: Moist. The periwound skin appearance did not exhibit: Callus, Crepitus, Excoriation, Fluctuance, Friable, Induration, Localized Edema, Rash, Scarring,  Dry/Scaly, Maceration, Atrophie Blanche, Cyanosis, Ecchymosis, Hemosiderin Staining, Mottled, Pallor, Rubor, Erythema. Wound #4 status is Open. Original cause of wound was Trauma. The wound is located on the Right Hand - Dorsum. The wound measures 0.9cm length x 1.2cm width x 0.1cm depth; 0.848cm^2 area and 0.085cm^3 volume. The wound is limited to skin breakdown. There is no tunneling or undermining noted. There is a none present amount of drainage noted. The wound margin is flat and intact. There is large (67-100%) pink granulation within the wound bed. There is no necrotic tissue within the wound bed. The periwound skin appearance exhibited: Moist. The periwound skin appearance did not exhibit: Callus, Crepitus, Excoriation, Fluctuance, Friable, Induration, Localized Edema, Rash, Scarring, Dry/Scaly, Maceration, Atrophie Blanche, Cyanosis, Ecchymosis, Hemosiderin Staining, Mottled, Pallor, Rubor, Erythema. Assessment Active Problems ICD-10 S41.101A - Unspecified open wound of right upper arm, initial encounter L89.629 - Pressure ulcer of left heel, unspecified stage I50.40 - Unspecified combined systolic (congestive) and diastolic (congestive) heart failure I89.0 - Lymphedema, not elsewhere classified Thorough discussion is had with her sister who has the power of attorney and at present I have recommended: 1. X-ray of the right upper arm done and the report was discussed with the patient's sister. 2. she can resume her anticoagulation this evening [name], [name] ([id number]) 3. Santyl ointment locally which should be applied daily. 4. We have discussed offloading techniques in detail for the lower extremities and at the present time will apply some Betadine paint to the heels 5. Review at the wound center next week. Procedures Wound #1 Wound #1 is a To be determined located on the Right Elbow . There was a Skin/Subcutaneous Tissue Debridement ([id number]) debridement with total area of  7.92 sq cm performed by Evlyn KannerBritto, Susann Lawhorne, MD. with the following instrument(s): Forceps and Scissors to remove Non-Viable tissue/material including Fat, Fibrin/Slough, Eschar, and Subcutaneous after achieving pain control using Other (lidociane 4%). A time out was conducted prior to the start of the procedure. There was no bleeding. The procedure was tolerated well with a pain level of 0 throughout and a pain level of 0 following the procedure. Post Debridement Measurements: 3.6cm length x 2.2cm width x 1.6cm depth; 9.953cm^3 volume. Post procedure Diagnosis Wound #1: Same as Pre-Procedure Plan Wound Cleansing: Wound #1 Right Elbow: Clean wound with Normal Saline. Wound #2 Right Calcaneus: Clean wound with Normal Saline. Wound #3 Right Hand - Dorsum: Clean wound with Normal Saline. Wound #4 Right Forearm: Clean wound with Normal Saline. Anesthetic: Wound #1 Right Elbow: Topical Lidocaine 4% cream applied to wound bed prior to debridement Primary Wound Dressing: Wound #1 Right Elbow: Santyl Ointment Wound #3 Right Hand - Dorsum: Mepitel One Wound #4 Right Forearm: Mepitel One Wound #2 Right Calcaneus: Other: - betadine paint Secondary Dressing: Wound #1 Right Elbow: Dry Gauze Boardered Foam Dressing Dressing Change Frequency: [name], [name] ([id number]) Wound #1 Right Elbow: Change dressing every [name]. Wound #2 Right Calcaneus: Change dressing  every [name]. Wound #3 Right Hand - Dorsum: Change dressing every week - Leave dressing in place Wound #4 Right Forearm: Change dressing every week - Leave dressing in place Follow-up Appointments: Wound #1 Right Elbow: Return Appointment in 1 week. Wound #2 Right Calcaneus: Return Appointment in 1 week. Wound #3 Right Hand - Dorsum: Return Appointment in 1 week. Wound #4 Right Forearm: Return Appointment in 1 week. Edema Control: Elevate legs to the level of the heart and pump ankles as often as possible - Float heels, elevate legs on  a couple pillows when in bed. Sage boots. Additional Orders / Instructions: Wound #1 Right Elbow: Increase protein intake. Home Health: Wound #1 Right Elbow: Continue [facility] Visits - [facility]- Change elbow dressing daily Home Health Nurse may visit PRN to address patient s wound care needs. FACE TO FACE ENCOUNTER: MEDICARE and MEDICAID PATIENTS: I certify that this patient is under my care and that I had a face-to-face encounter that meets the physician face-to-face encounter requirements with this patient on this date. The encounter with the patient was in whole or in part for the following MEDICAL CONDITION: (primary reason for Home Healthcare) MEDICAL NECESSITY: I certify, that based on my findings, NURSING services are a medically necessary home health service. HOME BOUND STATUS: I certify that my clinical findings support that this patient is homebound (i.e., Due to illness or injury, pt requires aid of supportive devices such as crutches, cane, wheelchairs, walkers, the use of special transportation or the assistance of another person to leave their place of residence. There is a normal inability to leave the home and doing so requires considerable and taxing effort. Other absences are for medical reasons / religious services and are infrequent or of short duration when for other reasons). If current dressing causes regression in wound condition, may D/C ordered dressing product/s and apply Normal Saline Moist Dressing daily until next Wound Healing Center / Other MD appointment. Notify Wound Healing Center of regression in wound condition at [phone]. Please direct any NON-WOUND related issues/requests for orders to patient's Primary Care Physician Medications-please add to medication list.: Wound #1 Right Elbow: Santyl Enzymatic Ointment Other: - Resume Eliquis as prescribed. [name], [name] ([id number]) Thorough discussion is had with her sister who has the power of  attorney and at present I have recommended: 1. X-ray of the right upper arm done and the report was discussed with the patient's sister. 2. she can resume her anticoagulation this evening 3. Santyl ointment locally which should be applied daily. 4. We have discussed offloading techniques in detail for the lower extremities and at the present time will apply some Betadine paint to the heels 5. Review at the wound center next week. Electronic Signature(s) Signed: 12/11/2015 11:21:54 AM By: Evlyn Kanner MD, FACS Entered By: Evlyn Kanner on 12/11/2015 11:21:53 [name] ([id number]) -------------------------------------------------------------------------------- SuperBill Details Patient Name: [name] Date of Service: 12/11/2015 Medical Record Number: [id number] Patient Account Number: [id number] Date of Birth/Sex: 1928-08-02 (82 y.o. Female) Treating RN: Huel Coventry Primary Care Physician: SYSTEM, PCP Other Clinician: Referring Physician: Treating Physician/Extender: Rudene Re in Treatment: 1 Diagnosis Coding ICD-10 Codes Code Description S41.101A Unspecified open wound of right upper arm, initial encounter L89.629 Pressure ulcer of left heel, unspecified stage I50.40 Unspecified combined systolic (congestive) and diastolic (congestive) heart failure I89.0 Lymphedema, not elsewhere classified Facility Procedures CPT4: Description Modifier Quantity Code 29528413 11042 - DEB SUBQ TISSUE 20 SQ CM/< 1 ICD-10 Description Diagnosis S41.101A Unspecified open wound of right upper  arm, initial encounter L89.629 Pressure ulcer of left heel, unspecified stage I50.40  Unspecified combined systolic (congestive) and diastolic (congestive) heart failure I89.0 Lymphedema, not elsewhere classified Physician Procedures CPT4: Description Modifier Quantity Code 1610960 99213 - WC PHYS LEVEL 3 - EST PT 25 1 ICD-10 Description Diagnosis S41.101A Unspecified open wound of right upper arm,  initial encounter L89.629 Pressure ulcer of left heel, unspecified stage I50.40  Unspecified combined systolic (congestive) and diastolic (congestive) heart failure I89.0 Lymphedema, not elsewhere classified CPT4: 4540981 11042 - WC PHYS SUBQ TISS 20 SQ CM 1 ICD-10 Description Diagnosis S41.101A Unspecified open wound of right upper arm, initial encounter DAYANIS, BERGQUIST ([id number]) Electronic Signature(s) Signed: 12/11/2015 11:22:15 AM By: Evlyn Kanner MD, FACS Entered By: Evlyn Kanner on 12/11/2015 11:22:15

## 2015-12-12 NOTE — Progress Notes (Signed)
Anita, Kirk ([id number]) Visit Report for 12/11/2015 Arrival Information Details Patient Name: [name], [name] Date of Service: 12/11/2015 10:00 AM Medical Record Number: [id number] Patient Account Number: [id number] Date of Birth/Sex: [birth year] (82 y.o. Female) Treating RN: Huel Coventry Primary Care Physician: SYSTEM, PCP Other Clinician: Referring Physician: Treating Physician/Extender: Rudene Re in Treatment: 1 Visit Information History Since Last Visit Added or deleted any medications: No Patient Arrived: Wheel Chair Any new allergies or adverse reactions: No Arrival Time: 10:11 Had a fall or experienced change in No Accompanied By: sister activities of daily living that may affect Transfer Assistance: Manual risk of falls: Patient Identification Verified: Yes Signs or symptoms of abuse/neglect since No Secondary Verification Process Yes last visito Completed: Hospitalized since last visit: No Patient Requires Transmission- No Has Compression in Place as Prescribed: Yes Based Precautions: Has Footwear/Offloading in Place as Yes Patient Has Alerts: Yes Prescribed: Patient Alerts: Patient on Blood Left: Other:Sage Thinner Boot ABI: (L) 1.56 Right: Other:Sage Eliquis Boot Pain Present Now: No Electronic Signature(s) Signed: 12/11/2015 5:25:15 PM By: Elliot Gurney, RN, BSN, Kim RN, BSN Entered By: Elliot Gurney, RN, BSN, Kim on 12/11/2015 10:18:56 [name] ([id number]) -------------------------------------------------------------------------------- Encounter Discharge Information Details Patient Name: [name] Date of Service: 12/11/2015 10:00 AM Medical Record Number: [id number] Patient Account Number: [id number] Date of Birth/Sex: 05-Apr-1929 (82 y.o. Female) Treating RN: Primary Care Physician: SYSTEM, PCP Other Clinician: Referring Physician: Treating Physician/Extender: Rudene Re in Treatment: 1 Encounter Discharge Information Items Schedule Follow-up  Appointment: No Medication Reconciliation completed No and provided to Patient/Care Marilu Rylander: Provided on Clinical Summary of Care: 12/11/2015 Form Type Recipient Paper Patient MJ Electronic Signature(s) Signed: 12/11/2015 11:02:45 AM By: Gwenlyn Perking Entered By: Gwenlyn Perking on 12/11/2015 11:02:44 [name] ([id number]) -------------------------------------------------------------------------------- Lower Extremity Assessment Details Patient Name: [name] Date of Service: 12/11/2015 10:00 AM Medical Record Number: [id number] Patient Account Number: [id number] Date of Birth/Sex: 02/22/1929 (82 y.o. Female) Treating RN: Huel Coventry Primary Care Physician: SYSTEM, PCP Other Clinician: Referring Physician: Treating Physician/Extender: Rudene Re in Treatment: 1 Edema Assessment Assessed: [Left: No] [Right: No] E[Left: dema] [Right: :] Calf Left: Right: Point of Measurement: 28 cm From Medial Instep 38 cm cm Ankle Left: Right: Point of Measurement: 8 cm From Medial Instep 27 cm cm Vascular Assessment Pulses: Posterior Tibial Dorsalis Pedis Palpable: [Left:Yes] Extremity colors, hair growth, and conditions: Extremity Color: [Left:Pale] Hair Growth on Extremity: [Left:No] Temperature of Extremity: [Left:Cool] Capillary Refill: [Left:> 3 seconds] Toe Nail Assessment Left: Right: Thick: No Discolored: No Deformed: No Improper Length and Hygiene: No Electronic Signature(s) Signed: 12/11/2015 5:25:15 PM By: Elliot Gurney, RN, BSN, Kim RN, BSN Entered By: Elliot Gurney, RN, BSN, Kim on 12/11/2015 10:32:56 [name] ([id number]) -------------------------------------------------------------------------------- Multi Wound Chart Details Patient Name: [name] Date of Service: 12/11/2015 10:00 AM Medical Record Number: [id number] Patient Account Number: [id number] Date of Birth/Sex: [birth year] (82 y.o. Female) Treating RN: Huel Coventry Primary Care Physician: SYSTEM,  PCP Other Clinician: Referring Physician: Treating Physician/Extender: Rudene Re in Treatment: 1 Vital Signs Height(in): 64 Pulse(bpm): 85 Weight(lbs): 153 Blood Pressure 114/50 (mmHg): Body Mass Index(BMI): 26 Temperature(F): 97.2 Respiratory Rate 18 (breaths/min): Photos: Wound Location: Right Elbow Right Calcaneus Right Forearm Wounding Event: Gradually Appeared Gradually Appeared Trauma Primary Etiology: To be determined Pressure Ulcer Trauma, Other Comorbid History: Cataracts, Lymphedema, Cataracts, Lymphedema, Cataracts, Lymphedema, Aspiration, Chronic Aspiration, Chronic Aspiration, Chronic Obstructive Pulmonary Obstructive Pulmonary Obstructive Pulmonary Disease (COPD), Disease (COPD), Disease (COPD), Arrhythmia, Congestive Arrhythmia, Congestive Arrhythmia, Congestive Heart Failure, Heart Failure, Heart Failure, Hypertension, Peripheral Hypertension,  Peripheral Hypertension, Peripheral Venous Disease, Venous Disease, Venous Disease, Osteoarthritis, Dementia Osteoarthritis, Dementia Osteoarthritis, Dementia Date Acquired: 11/14/2015 10/30/2015 12/07/2015 Weeks of Treatment: 1 0 0 Wound Status: Open Open Open Measurements L x W x D 3.6x2.2x1.4 2x3x0.1 2x0.5x0.1 (cm) Area (cm) : 6.22 4.712 0.785 Volume (cm) : 8.708 0.471 0.079 % Reduction in Area: 43.40% 0.00% N/A % Reduction in Volume: 53.40% 0.00% N/A Classification: Unstageable/Unclassified Partial Thickness Madej, Arabella ([id number]) Full Thickness Without Exposed Support Structures Exudate Amount: Large None Present Small Exudate Type: Serosanguineous N/A Serous Exudate Color: red, brown N/A amber Foul Odor After Yes N/A N/A Cleansing: Odor Anticipated Due to No N/A N/A Product Use: Wound Margin: Flat and Intact Flat and Intact Flat and Intact Granulation Amount: None Present (0%) None Present (0%) Small (1-33%) Granulation Quality: N/A N/A Pink Necrotic Amount: Large (67-100%) Large (67-100%)  None Present (0%) Necrotic Tissue: Eschar, Adherent Slough Eschar N/A Exposed Structures: Fascia: No Fascia: No Fascia: No Fat: No Fat: No Fat: No Tendon: No Tendon: No Tendon: No Muscle: No Muscle: No Muscle: No Joint: No Joint: No Joint: No Bone: No Bone: No Bone: No Limited to Skin Limited to Skin Limited to Skin Breakdown Breakdown Breakdown Epithelialization: None N/A Medium (34-66%) Periwound Skin Texture: Edema: Yes Edema: No Edema: No Excoriation: No Excoriation: No Induration: No Induration: No Callus: No Callus: No Crepitus: No Crepitus: No Fluctuance: No Fluctuance: No Friable: No Friable: No Rash: No Rash: No Scarring: No Scarring: No Periwound Skin Moist: Yes Dry/Scaly: Yes Moist: Yes Moisture: Maceration: No Maceration: No Moist: No Dry/Scaly: No Periwound Skin Color: Erythema: Yes Atrophie Blanche: No Atrophie Blanche: No Cyanosis: No Cyanosis: No Ecchymosis: No Ecchymosis: No Erythema: No Erythema: No Hemosiderin Staining: No Hemosiderin Staining: No Mottled: No Mottled: No Pallor: No Pallor: No Rubor: No Rubor: No Erythema Location: Circumferential N/A N/A Temperature: No Abnormality N/A N/A Tenderness on Yes No No Palpation: Wound Preparation: Ulcer Cleansing: Ulcer Cleansing: Not Ulcer Cleansing: Rinsed/Irrigated with Cleansed Rinsed/Irrigated with Martos, Millisa ([id number]) Saline Saline Topical Anesthetic Topical Anesthetic Applied: None Topical Anesthetic Applied: Other: lidocaine Applied: None 4% Wound Number: 4 N/A N/A Photos: N/A N/A Wound Location: Right Hand - Dorsum N/A N/A Wounding Event: Trauma N/A N/A Primary Etiology: Trauma, Other N/A N/A Comorbid History: Cataracts, Lymphedema, N/A N/A Aspiration, Chronic Obstructive Pulmonary Disease (COPD), Arrhythmia, Congestive Heart Failure, Hypertension, Peripheral Venous Disease, Osteoarthritis, Dementia Date Acquired: 12/07/2015 N/A N/A Weeks of  Treatment: 0 N/A N/A Wound Status: Open N/A N/A Measurements L x W x D 0.9x1.2x0.1 N/A N/A (cm) Area (cm) : 0.848 N/A N/A Volume (cm) : 0.085 N/A N/A % Reduction in Area: N/A N/A N/A % Reduction in Volume: N/A N/A N/A Classification: Partial Thickness N/A N/A Exudate Amount: None Present N/A N/A Exudate Type: N/A N/A N/A Exudate Color: N/A N/A N/A Foul Odor After N/A N/A N/A Cleansing: Odor Anticipated Due to N/A N/A N/A Product Use: Wound Margin: Flat and Intact N/A N/A Granulation Amount: Large (67-100%) N/A N/A Granulation Quality: Pink N/A N/A Necrotic Amount: None Present (0%) N/A N/A Necrotic Tissue: N/A N/A N/A Exposed Structures: N/A N/A Mcqueen, Johnny [place] ([id number]) Fascia: No Fat: No Tendon: No Muscle: No Joint: No Bone: No Limited to Skin Breakdown Epithelialization: Large (67-100%) N/A N/A Periwound Skin Texture: Edema: No N/A N/A Excoriation: No Induration: No Callus: No Crepitus: No Fluctuance: No Friable: No Rash: No Scarring: No Periwound Skin Moist: Yes N/A N/A Moisture: Maceration: No Dry/Scaly: No Periwound Skin Color: Atrophie Blanche: No N/A N/A Cyanosis: No  Ecchymosis: No Erythema: No Hemosiderin Staining: No Mottled: No Pallor: No Rubor: No Erythema Location: N/A N/A N/A Temperature: N/A N/A N/A Tenderness on No N/A N/A Palpation: Wound Preparation: Ulcer Cleansing: N/A N/A Rinsed/Irrigated with Saline Topical Anesthetic Applied: None Treatment Notes Electronic Signature(s) Signed: 12/11/2015 5:25:15 PM By: Elliot Gurney, RN, BSN, Kim RN, BSN Entered By: Elliot Gurney, RN, BSN, Kim on 12/11/2015 10:40:59 [name] ([id number]) -------------------------------------------------------------------------------- Multi-Disciplinary Care Plan Details Patient Name: [name] Date of Service: 12/11/2015 10:00 AM Medical Record Number: [id number] Patient Account Number: [id number] Date of Birth/Sex: May 12, 1929 (82 y.o. Female) Treating RN: Huel Coventry Primary Care Physician: SYSTEM, PCP Other Clinician: Referring Physician: Treating Physician/Extender: Rudene Re in Treatment: 1 Active Inactive Abuse / Safety / Falls / Self Care Management Nursing Diagnoses: Potential for falls Goals: Patient will remain injury free Date Initiated: 12/04/2015 Goal Status: Active Interventions: Assess fall risk on admission and as needed Notes: Nutrition Nursing Diagnoses: Potential for alteratiion in Nutrition/Potential for imbalanced nutrition Goals: Patient/caregiver agrees to and verbalizes understanding of need to obtain nutritional consultation Date Initiated: 12/04/2015 Goal Status: Active Interventions: Provide education on nutrition Notes: Orientation to the Wound Care Program Nursing Diagnoses: Knowledge deficit related to the wound healing center program Goals: Patient/caregiver will verbalize understanding of the Wound Healing Center Program Date Initiated: 12/04/2015 JASARA, CORRIGAN ([id number]) Goal Status: Active Interventions: Provide education on orientation to the wound center Notes: Venous Leg Ulcer Nursing Diagnoses: Actual venous Insuffiency (use after diagnosis is confirmed) Goals: Verify adequate tissue perfusion prior to therapeutic compression application Date Initiated: 12/04/2015 Goal Status: Active Interventions: Provide education on venous insufficiency Notes: Wound/Skin Impairment Nursing Diagnoses: Impaired tissue integrity Goals: Ulcer/skin breakdown will heal within 14 weeks Date Initiated: 12/04/2015 Goal Status: Active Interventions: Assess patient/caregiver ability to obtain necessary supplies Notes: Electronic Signature(s) Signed: 12/11/2015 5:25:15 PM By: Elliot Gurney, RN, BSN, Kim RN, BSN Entered By: Elliot Gurney, RN, BSN, Kim on 12/11/2015 10:34:44 [name] ([id number]) -------------------------------------------------------------------------------- Pain Assessment Details Patient Name:  [name] Date of Service: 12/11/2015 10:00 AM Medical Record Number: [id number] Patient Account Number: [id number] Date of Birth/Sex: 1928-12-04 (82 y.o. Female) Treating RN: Huel Coventry Primary Care Physician: SYSTEM, PCP Other Clinician: Referring Physician: Treating Physician/Extender: Rudene Re in Treatment: 1 Active Problems Location of Pain Severity and Description of Pain Patient Has Paino No Site Locations With Dressing Change: No Pain Management and Medication Current Pain Management: Electronic Signature(s) Signed: 12/11/2015 5:25:15 PM By: Elliot Gurney, RN, BSN, Kim RN, BSN Entered By: Elliot Gurney, RN, BSN, Kim on 12/11/2015 10:19:03 [name] ([id number]) -------------------------------------------------------------------------------- Wound Assessment Details Patient Name: [name] Date of Service: 12/11/2015 10:00 AM Medical Record Number: [id number] Patient Account Number: [id number] Date of Birth/Sex: [birth year] (82 y.o. Female) Treating RN: Huel Coventry Primary Care Physician: SYSTEM, PCP Other Clinician: Referring Physician: Treating Physician/Extender: Rudene Re in Treatment: 1 Wound Status Wound Number: 1 Primary To be determined Etiology: Wound Location: Right Elbow Wound Open Wounding Event: Gradually Appeared Status: Date Acquired: 11/14/2015 Comorbid Cataracts, Lymphedema, Aspiration, Weeks Of Treatment: 1 History: Chronic Obstructive Pulmonary Disease Clustered Wound: No (COPD), Arrhythmia, Congestive Heart Failure, Hypertension, Peripheral Venous Disease, Osteoarthritis, Dementia Photos Wound Measurements Length: (cm) 3.6 Width: (cm) 2.2 Depth: (cm) 1.4 Area: (cm) 6.22 Volume: (cm) 8.708 % Reduction in Area: 43.4% % Reduction in Volume: 53.4% Epithelialization: None Wound Description Full Thickness Without Exposed Classification: Support Structures Wound Margin: Flat and Intact Exudate Large Amount: Exudate Type:  Serosanguineous Exudate Color: red, brown Foul Odor After Cleansing: Yes Due to Product Use:  No Wound Bed Granulation Amount: None Present (0%) Exposed Structure Kassabian, Zyasia ([id number]) Necrotic Amount: Large (67-100%) Fascia Exposed: No Necrotic Quality: Eschar, Adherent Slough Fat Layer Exposed: No Tendon Exposed: No Muscle Exposed: No Joint Exposed: No Bone Exposed: No Limited to Skin Breakdown Periwound Skin Texture Texture Color No Abnormalities Noted: No No Abnormalities Noted: No Localized Edema: Yes Erythema: Yes Erythema Location: Circumferential Moisture No Abnormalities Noted: No Temperature / Pain Moist: Yes Temperature: No Abnormality Tenderness on Palpation: Yes Wound Preparation Ulcer Cleansing: Rinsed/Irrigated with Saline Topical Anesthetic Applied: Other: lidocaine 4%, Electronic Signature(s) Signed: 12/11/2015 5:25:15 PM By: Elliot GurneyWoody, RN, BSN, Kim RN, BSN Entered By: Elliot GurneyWoody, RN, BSN, Kim on 12/11/2015 10:28:29 Anita BubaJOBE, Warren ([id number]) -------------------------------------------------------------------------------- Wound Assessment Details Patient Name: Anita BubaJOBE, Suki Date of Service: 12/11/2015 10:00 AM Medical Record Number: [id number] Patient Account Number: [id number] Date of Birth/Sex: [birth year] (82 y.o. Female) Treating RN: Huel CoventryWoody, Kim Primary Care Physician: SYSTEM, PCP Other Clinician: Referring Physician: Treating Physician/Extender: Rudene ReBritto, Errol Weeks in Treatment: 1 Wound Status Wound Number: 2 Primary Pressure Ulcer Etiology: Wound Location: Right Calcaneus Wound Open Wounding Event: Gradually Appeared Status: Date Acquired: 10/30/2015 Comorbid Cataracts, Lymphedema, Aspiration, Weeks Of Treatment: 0 History: Chronic Obstructive Pulmonary Disease Clustered Wound: No (COPD), Arrhythmia, Congestive Heart Failure, Hypertension, Peripheral Venous Disease, Osteoarthritis, Dementia Photos Wound Measurements Length: (cm) 2 Width: (cm)  3 Depth: (cm) 0.1 Area: (cm) 4.712 Volume: (cm) 0.471 % Reduction in Area: 0% % Reduction in Volume: 0% Wound Description Classification: Unstageable/Unclassified Wound Margin: Flat and Intact Exudate Amount: None Present Wound Bed Granulation Amount: None Present (0%) Exposed Structure Necrotic Amount: Large (67-100%) Fascia Exposed: No Necrotic Quality: Eschar Fat Layer Exposed: No Tendon Exposed: No Muscle Exposed: No Mazzeo, Zerenity ([id number]) Joint Exposed: No Bone Exposed: No Limited to Skin Breakdown Periwound Skin Texture Texture Color No Abnormalities Noted: No No Abnormalities Noted: No Callus: No Atrophie Blanche: No Crepitus: No Cyanosis: No Excoriation: No Ecchymosis: No Fluctuance: No Erythema: No Friable: No Hemosiderin Staining: No Induration: No Mottled: No Localized Edema: No Pallor: No Rash: No Rubor: No Scarring: No Moisture No Abnormalities Noted: No Dry / Scaly: Yes Maceration: No Moist: No Wound Preparation Ulcer Cleansing: Not Cleansed Topical Anesthetic Applied: None Electronic Signature(s) Signed: 12/11/2015 5:25:15 PM By: Elliot GurneyWoody, RN, BSN, Kim RN, BSN Entered By: Elliot GurneyWoody, RN, BSN, Kim on 12/11/2015 10:28:08 Anita BubaJOBE, Saniyah ([id number]) -------------------------------------------------------------------------------- Wound Assessment Details Patient Name: Anita BubaJOBE, Dolly Date of Service: 12/11/2015 10:00 AM Medical Record Number: [id number] Patient Account Number: [id number] Date of Birth/Sex: [birth year] (82 y.o. Female) Treating RN: Huel CoventryWoody, Kim Primary Care Physician: SYSTEM, PCP Other Clinician: Referring Physician: Treating Physician/Extender: Rudene ReBritto, Errol Weeks in Treatment: 1 Wound Status Wound Number: 3 Primary Trauma, Other Etiology: Wound Location: Right Forearm Wound Open Wounding Event: Trauma Status: Date Acquired: 12/07/2015 Comorbid Cataracts, Lymphedema, Aspiration, Weeks Of Treatment: 0 History: Chronic Obstructive  Pulmonary Disease Clustered Wound: No (COPD), Arrhythmia, Congestive Heart Failure, Hypertension, Peripheral Venous Disease, Osteoarthritis, Dementia Photos Wound Measurements Length: (cm) 2 Width: (cm) 0.5 Depth: (cm) 0.1 Area: (cm) 0.785 Volume: (cm) 0.079 % Reduction in Area: % Reduction in Volume: Epithelialization: Medium (34-66%) Tunneling: No Undermining: No Wound Description Classification: Partial Thickness Wound Margin: Flat and Intact Exudate Amount: Small Exudate Type: Serous Exudate Color: amber Wound Bed Granulation Amount: Small (1-33%) Exposed Structure Granulation Quality: Pink Fascia Exposed: No Necrotic Amount: None Present (0%) Fat Layer Exposed: No Joss, Oliviagrace ([id number]) Tendon Exposed: No Muscle Exposed: No Joint Exposed: No Bone Exposed: No Limited to Skin Breakdown Periwound Skin Texture  Texture Color No Abnormalities Noted: No No Abnormalities Noted: No Callus: No Atrophie Blanche: No Crepitus: No Cyanosis: No Excoriation: No Ecchymosis: No Fluctuance: No Erythema: No Friable: No Hemosiderin Staining: No Induration: No Mottled: No Localized Edema: No Pallor: No Rash: No Rubor: No Scarring: No Moisture No Abnormalities Noted: No Dry / Scaly: No Maceration: No Moist: Yes Wound Preparation Ulcer Cleansing: Rinsed/Irrigated with Saline Topical Anesthetic Applied: None Electronic Signature(s) Signed: 12/11/2015 5:25:15 PM By: Elliot Gurney, RN, BSN, Kim RN, BSN Entered By: Elliot Gurney, RN, BSN, Kim on 12/11/2015 10:30:04 [name] ([id number]) -------------------------------------------------------------------------------- Wound Assessment Details Patient Name: [name] Date of Service: 12/11/2015 10:00 AM Medical Record Number: [id number] Patient Account Number: [id number] Date of Birth/Sex: [birth year] (82 y.o. Female) Treating RN: Huel Coventry Primary Care Physician: SYSTEM, PCP Other Clinician: Referring Physician: Treating  Physician/Extender: Rudene Re in Treatment: 1 Wound Status Wound Number: 4 Primary Trauma, Other Etiology: Wound Location: Right Hand - Dorsum Wound Open Wounding Event: Trauma Status: Date Acquired: 12/07/2015 Comorbid Cataracts, Lymphedema, Aspiration, Weeks Of Treatment: 0 History: Chronic Obstructive Pulmonary Disease Clustered Wound: No (COPD), Arrhythmia, Congestive Heart Failure, Hypertension, Peripheral Venous Disease, Osteoarthritis, Dementia Photos Wound Measurements Length: (cm) 0.9 Width: (cm) 1.2 Depth: (cm) 0.1 Area: (cm) 0.848 Volume: (cm) 0.085 % Reduction in Area: % Reduction in Volume: Epithelialization: Large (67-100%) Tunneling: No Undermining: No Wound Description Classification: Partial Thickness Wound Margin: Flat and Intact Exudate Amount: None Present Wound Bed Granulation Amount: Large (67-100%) Exposed Structure Granulation Quality: Pink Fascia Exposed: No Necrotic Amount: None Present (0%) Fat Layer Exposed: No Tendon Exposed: No Muscle Exposed: No Leventhal, Dashanti ([id number]) Joint Exposed: No Bone Exposed: No Limited to Skin Breakdown Periwound Skin Texture Texture Color No Abnormalities Noted: No No Abnormalities Noted: No Callus: No Atrophie Blanche: No Crepitus: No Cyanosis: No Excoriation: No Ecchymosis: No Fluctuance: No Erythema: No Friable: No Hemosiderin Staining: No Induration: No Mottled: No Localized Edema: No Pallor: No Rash: No Rubor: No Scarring: No Moisture No Abnormalities Noted: No Dry / Scaly: No Maceration: No Moist: Yes Wound Preparation Ulcer Cleansing: Rinsed/Irrigated with Saline Topical Anesthetic Applied: None Electronic Signature(s) Signed: 12/11/2015 5:25:15 PM By: Elliot Gurney, RN, BSN, Kim RN, BSN Entered By: Elliot Gurney, RN, BSN, Kim on 12/11/2015 10:31:26 [name] ([id number]) -------------------------------------------------------------------------------- Vitals Details Patient  Name: [name] Date of Service: 12/11/2015 10:00 AM Medical Record Number: [id number] Patient Account Number: [id number] Date of Birth/Sex: 06-28-1929 (82 y.o. Female) Treating RN: Huel Coventry Primary Care Physician: SYSTEM, PCP Other Clinician: Referring Physician: Treating Physician/Extender: Rudene Re in Treatment: 1 Vital Signs Time Taken: 10:30 Temperature (F): 97.2 Height (in): 64 Pulse (bpm): 85 Weight (lbs): 153 Respiratory Rate (breaths/min): 18 Body Mass Index (BMI): 26.3 Blood Pressure (mmHg): 114/50 Reference Range: 80 - 120 mg / dl Electronic Signature(s) Signed: 12/11/2015 5:25:15 PM By: Elliot Gurney, RN, BSN, Kim RN, BSN Entered By: Elliot Gurney, RN, BSN, Kim on 12/11/2015 10:32:40

## 2015-12-17 ENCOUNTER — Emergency Department: Payer: Medicare Other

## 2015-12-17 ENCOUNTER — Other Ambulatory Visit: Payer: Self-pay

## 2015-12-17 ENCOUNTER — Encounter: Payer: Self-pay | Admitting: *Deleted

## 2015-12-17 ENCOUNTER — Emergency Department
Admission: EM | Admit: 2015-12-17 | Discharge: 2015-12-18 | Disposition: A | Discharge status: Home / Self Care | Payer: Medicare Other | Attending: Emergency Medicine | Admitting: Emergency Medicine

## 2015-12-17 DIAGNOSIS — Z79899 Other long term (current) drug therapy: Secondary | ICD-10-CM | POA: Insufficient documentation

## 2015-12-17 DIAGNOSIS — E785 Hyperlipidemia, unspecified: Secondary | ICD-10-CM | POA: Diagnosis not present

## 2015-12-17 DIAGNOSIS — I5022 Chronic systolic (congestive) heart failure: Secondary | ICD-10-CM | POA: Diagnosis not present

## 2015-12-17 DIAGNOSIS — M1990 Unspecified osteoarthritis, unspecified site: Secondary | ICD-10-CM | POA: Diagnosis not present

## 2015-12-17 DIAGNOSIS — G20 Parkinson's disease: Secondary | ICD-10-CM | POA: Diagnosis not present

## 2015-12-17 DIAGNOSIS — F329 Major depressive disorder, single episode, unspecified: Secondary | ICD-10-CM | POA: Insufficient documentation

## 2015-12-17 DIAGNOSIS — I481 Persistent atrial fibrillation: Secondary | ICD-10-CM | POA: Insufficient documentation

## 2015-12-17 DIAGNOSIS — I110 Hypertensive heart disease with heart failure: Secondary | ICD-10-CM | POA: Diagnosis not present

## 2015-12-17 DIAGNOSIS — R601 Generalized edema: Secondary | ICD-10-CM | POA: Diagnosis not present

## 2015-12-17 DIAGNOSIS — R0602 Shortness of breath: Secondary | ICD-10-CM | POA: Insufficient documentation

## 2015-12-17 DIAGNOSIS — R2243 Localized swelling, mass and lump, lower limb, bilateral: Secondary | ICD-10-CM | POA: Diagnosis present

## 2015-12-17 DIAGNOSIS — R0600 Dyspnea, unspecified: Secondary | ICD-10-CM

## 2015-12-17 LAB — BASIC METABOLIC PANEL
ANION GAP: 8 (ref 5–15)
BUN: 18 mg/dL (ref 6–20)
CALCIUM: 8.8 mg/dL — AB (ref 8.9–10.3)
CO2: 28 mmol/L (ref 22–32)
Chloride: 104 mmol/L (ref 101–111)
Creatinine, Ser: 0.98 mg/dL (ref 0.44–1.00)
GFR, EST AFRICAN AMERICAN: 59 mL/min — AB (ref >60)
GFR, EST NON AFRICAN AMERICAN: 51 mL/min — AB (ref >60)
GLUCOSE: 120 mg/dL — AB (ref 65–99)
Potassium: 3.9 mmol/L (ref 3.5–5.1)
SODIUM: 140 mmol/L (ref 135–145)

## 2015-12-17 LAB — CBC WITH DIFFERENTIAL/PLATELET
BASOS ABS: 0 10*3/uL (ref 0–0.1)
BASOS PCT: 1 %
EOS ABS: 0.3 10*3/uL (ref 0–0.7)
EOS PCT: 5 %
HCT: 28.4 % — ABNORMAL LOW (ref 35.0–47.0)
Hemoglobin: 9.3 g/dL — ABNORMAL LOW (ref 12.0–16.0)
Lymphocytes Relative: 17 %
Lymphs Abs: 1.1 10*3/uL (ref 1.0–3.6)
MCH: 34.1 pg — ABNORMAL HIGH (ref 26.0–34.0)
MCHC: 32.9 g/dL (ref 32.0–36.0)
MCV: 103.6 fL — AB (ref 80.0–100.0)
MONO ABS: 1.2 10*3/uL — AB (ref 0.2–0.9)
Monocytes Relative: 18 %
Neutro Abs: 4 10*3/uL (ref 1.4–6.5)
Neutrophils Relative %: 59 %
PLATELETS: 298 10*3/uL (ref 150–440)
RBC: 2.74 MIL/uL — ABNORMAL LOW (ref 3.80–5.20)
RDW: 14.2 % (ref 11.5–14.5)
WBC: 6.6 10*3/uL (ref 3.6–11.0)

## 2015-12-17 LAB — BRAIN NATRIURETIC PEPTIDE: B Natriuretic Peptide: 251 pg/mL — ABNORMAL HIGH (ref 0.0–100.0)

## 2015-12-17 LAB — TROPONIN I: Troponin I: <0.03 ng/mL (ref <0.031)

## 2015-12-17 MED ORDER — IPRATROPIUM-ALBUTEROL 0.5-2.5 (3) MG/3ML IN SOLN: 3.0000 mL | Freq: Two times a day (BID) | RESPIRATORY_TRACT | Status: DC

## 2015-12-17 MED ORDER — PREDNISONE 20 MG PO TABS: 40.0000 mg | ORAL_TABLET | Freq: Every day | ORAL | Status: DC

## 2015-12-17 MED ORDER — FUROSEMIDE 40 MG PO TABS: 40.0000 mg | ORAL_TABLET | Freq: Every day | ORAL | Status: DC

## 2015-12-17 NOTE — Discharge Instructions (Signed)
Edema °Edema is an abnormal buildup of fluids in your body tissues. Edema is somewhat dependent on gravity to pull the fluid to the lowest place in your body. That makes the condition more common in the legs and thighs (lower extremities). Painless swelling of the feet and ankles is common and becomes more likely as you get older. It is also common in looser tissues, like around your eyes.  °When the affected area is squeezed, the fluid may move out of that spot and leave a dent for a few moments. This dent is called pitting.  °CAUSES  °There are many possible causes of edema. Eating too much salt and being on your feet or sitting for a long time can cause edema in your legs and ankles. Hot weather may make edema worse. Common medical causes of edema include: °· Heart failure. °· Liver disease. °· Kidney disease. °· Weak blood vessels in your legs. °· Cancer. °· An injury. °· Pregnancy. °· Some medications. °· Obesity.  °SYMPTOMS  °Edema is usually painless. Your skin may look swollen or shiny.  °DIAGNOSIS  °Your health care provider may be able to diagnose edema by asking about your medical history and doing a physical exam. You may need to have tests such as X-rays, an electrocardiogram, or blood tests to check for medical conditions that may cause edema.  °TREATMENT  °Edema treatment depends on the cause. If you have heart, liver, or kidney disease, you need the treatment appropriate for these conditions. General treatment may include: °· Elevation of the affected body part above the level of your heart. °· Compression of the affected body part. Pressure from elastic bandages or support stockings squeezes the tissues and forces fluid back into the blood vessels. This keeps fluid from entering the tissues. °· Restriction of fluid and salt intake. °· Use of a water pill (diuretic). These medications are appropriate only for some types of edema. They pull fluid out of your body and make you urinate more often. This  gets rid of fluid and reduces swelling, but diuretics can have side effects. Only use diuretics as directed by your health care provider. °HOME CARE INSTRUCTIONS  °· Keep the affected body part above the level of your heart when you are lying down.   °· Do not sit still or stand for prolonged periods.   °· Do not put anything directly under your knees when lying down. °· Do not wear constricting clothing or garters on your upper legs.   °· Exercise your legs to work the fluid back into your blood vessels. This may help the swelling go down.   °· Wear elastic bandages or support stockings to reduce ankle swelling as directed by your health care provider.   °· Eat a low-salt diet to reduce fluid if your health care provider recommends it.   °· Only take medicines as directed by your health care provider.  °SEEK MEDICAL CARE IF:  °· Your edema is not responding to treatment. °· You have heart, liver, or kidney disease and notice symptoms of edema. °· You have edema in your legs that does not improve after elevating them.   °· You have sudden and unexplained weight gain. °SEEK IMMEDIATE MEDICAL CARE IF:  °· You develop shortness of breath or chest pain.   °· You cannot breathe when you lie down. °· You develop pain, redness, or warmth in the swollen areas.   °· You have heart, liver, or kidney disease and suddenly get edema. °· You have a fever and your symptoms suddenly get worse. °MAKE SURE YOU:  °·   Understand these instructions.  Will watch your condition.  Will get help right away if you are not doing well or get worse.   This information is not intended to replace advice given to you by your health care provider. Make sure you discuss any questions you have with your health care provider.   Document Released: 06/17/2005 Document Revised: 07/08/2014 Document Reviewed: 04/09/2013 Elsevier Interactive Patient Education 2016 [employer]  Shortness of Breath Shortness of breath means you have trouble  breathing. It could also mean that you have a medical problem. You should get immediate medical care for shortness of breath. CAUSES   Not enough oxygen in the air such as with high altitudes or a smoke-filled room.  Certain lung diseases, infections, or problems.  Heart disease or conditions, such as angina or heart failure.  Low red blood cells (anemia).  Poor physical fitness, which can cause shortness of breath when you exercise.  Chest or back injuries or stiffness.  Being overweight.  Smoking.  Anxiety, which can make you feel like you are not getting enough air. DIAGNOSIS  Serious medical problems can often be found during your physical exam. Tests may also be done to determine why you are having shortness of breath. Tests may include:  Chest X-rays.  Lung function tests.  Blood tests.  An electrocardiogram (ECG).  An ambulatory electrocardiogram. An ambulatory ECG records your heartbeat patterns over a 24-hour period.  Exercise testing.  A transthoracic echocardiogram (TTE). During echocardiography, sound waves are used to evaluate how blood flows through your heart.  A transesophageal echocardiogram (TEE).  Imaging scans. Your health care provider may not be able to find a cause for your shortness of breath after your exam. In this case, it is important to have a follow-up exam with your health care provider as directed.  TREATMENT  Treatment for shortness of breath depends on the cause of your symptoms and can vary greatly. HOME CARE INSTRUCTIONS   Do not smoke. Smoking is a common cause of shortness of breath. If you smoke, ask for help to quit.  Avoid being around chemicals or things that may bother your breathing, such as paint fumes and dust.  Rest as needed. Slowly resume your usual activities.  If medicines were prescribed, take them as directed for the full length of time directed. This includes oxygen and any inhaled medicines.  Keep all  follow-up appointments as directed by your health care provider. SEEK MEDICAL CARE IF:   Your condition does not improve in the time expected.  You have a hard time doing your normal activities even with rest.  You have any new symptoms. SEEK IMMEDIATE MEDICAL CARE IF:   Your shortness of breath gets worse.  You feel light-headed, faint, or develop a cough not controlled with medicines.  You start coughing up blood.  You have pain with breathing.  You have chest pain or pain in your arms, shoulders, or abdomen.  You have a fever.  You are unable to walk up stairs or exercise the way you normally do. MAKE SURE YOU:  Understand these instructions.  Will watch your condition.  Will get help right away if you are not doing well or get worse.   This information is not intended to replace advice given to you by your health care provider. Make sure you discuss any questions you have with your health care provider.   Document Released: 03/12/2001 Document Revised: 06/22/2013 Document Reviewed: 09/02/2011 Elsevier Interactive Patient Education 2016 Elsevier  Inc. ° °

## 2015-12-17 NOTE — ED Provider Notes (Addendum)
[facility] Emergency Department Provider Note        Time seen: ----------------------------------------- 10:21 PM on 12/17/2015 -----------------------------------------    I have reviewed the triage vital signs and the nursing notes.   HISTORY  Chief Complaint Leg Swelling and Shortness of Breath    HPI [name] is a 82 y.o. female who presents to ER for bilateral leg swelling is worsened today. She was also noted to have some shortness of breath, nursing administered one DuoNeb treatment and reports that help her feel better. She does have pitting edema noted on arrival. She was disoriented to time upon arrival. Patient denies other complaints at this time.   Past Medical History  Diagnosis Date  . Persistent atrial fibrillation (HCC)     a. Eliquis 5 mg bid; b. CHADS2VASc = 5 (CHF, HTN, age x 2, female)  . Chronic systolic CHF (congestive heart failure) (HCC)     a. echo 07/2014: EF 45-50%, dilated LA at 4.5 cm, mildly dilated RA, mod-sev MR, mod TR, mildly elevated PASP @ 45.2  . HTN (hypertension)   . Cognitive impairment   . Gait instability   . Depression   . Panic attacks   . Arthritis   . Hyperlipidemia   . Parkinson's disease (HCC)   . Anxiety disorder     Patient Active Problem List   Diagnosis Date Noted  . Altered mental state 09/18/2015  . UTI (lower urinary tract infection) 09/18/2015  . Pressure ulcer 09/18/2015  . Acute suppurative parotitis   . Atrial fibrillation with RVR (HCC)   . Hypokalemia   . Sepsis (HCC)   . Morbid obesity due to excess calories (HCC)   . Submandibular gland infection 08/13/2015  . Bilateral leg edema 10/24/2014  . Chronic atrial fibrillation (HCC) 08/23/2014  . Chronic systolic CHF (congestive heart failure) (HCC)   . Persistent atrial fibrillation (HCC)   . HTN (hypertension)   . Cognitive impairment   . Gait instability   . Depression   . Panic attacks     Past Surgical History   Procedure Laterality Date  . Eye surgery      Allergies Aliskiren-hydrochlorothiazide; Amlodipine; and Sulfa antibiotics  Social History Social History  Substance Use Topics  . Smoking status: Never Smoker   . Smokeless tobacco: None  . Alcohol Use: No    Review of Systems Constitutional: Negative for fever. Cardiovascular: Negative for chest pain. Respiratory: Positive for shortness of breath Gastrointestinal: Negative for abdominal pain, vomiting and diarrhea. Genitourinary: Negative for dysuria. Musculoskeletal: Negative for back pain. Positive for edema Skin: Negative for rash. Neurological: Negative for headaches, focal weakness or numbness.  10-point ROS otherwise negative.  ____________________________________________   PHYSICAL EXAM:  VITAL SIGNS: ED Triage Vitals  Enc Vitals Group     BP 12/17/15 2144 112/74 mmHg     Pulse Rate 12/17/15 2144 128     Resp 12/17/15 2144 22     Temp 12/17/15 2144 97.4 F (36.3 C)     Temp Source 12/17/15 2144 Axillary     SpO2 12/17/15 2144 98 %     Weight 12/17/15 2142 160 lb (72.576 kg)     Height --      Head Cir --      Peak Flow --      Pain Score --      Pain Loc --      Pain Edu? --      Excl. in GC? --  Constitutional: Alert But disoriented, no acute distress Eyes: Conjunctivae are normal. PERRL. Normal extraocular movements. ENT   Head: Normocephalic and atraumatic.   Nose: No congestion/rhinnorhea.   Mouth/Throat: Mucous membranes are moist.   Neck: No stridor. Cardiovascular: Rapid rate, regular rhythm. No murmurs, rubs, or gallops. Respiratory: Normal respiratory effort without tachypnea nor retractions. Breath sounds are clear and equal bilaterally. No wheezes/rales/rhonchi. Gastrointestinal: Soft and nontender. Normal bowel sounds Musculoskeletal: Pitting edema to the lower extremities., Edema noted to the right upper extremity as well as the left Neurologic:  Normal speech and  language. No gross focal neurologic deficits are appreciated.  Skin:  Skin is warm, dry and intact. No rash noted. Psychiatric: Mood and affect are normal. Speech and behavior are normal.  ____________________________________________  EKG: Interpreted by me. atrial fibrillation with a rate of 93 bpm, normal QRS, normal QT interval. Normal axis. No evidence of acute infarction  ____________________________________________  ED COURSE:  Pertinent labs & imaging results that were available during my care of the patient were reviewed by me and considered in my medical decision making (see chart for details). Patient resists the ER with edema and shortness of breath. We'll check cardiac labs, imaging. ____________________________________________    LABS (pertinent positives/negatives)  Labs Reviewed  CBC WITH DIFFERENTIAL/PLATELET - Abnormal; Notable for the following:    RBC 2.74 (*)    Hemoglobin 9.3 (*)    HCT 28.4 (*)    MCV 103.6 (*)    MCH 34.1 (*)    Monocytes Absolute 1.2 (*)    All other components within normal limits  BASIC METABOLIC PANEL - Abnormal; Notable for the following:    Glucose, Bld 120 (*)    Calcium 8.8 (*)    GFR calc non Af Amer 51 (*)    GFR calc Af Amer 59 (*)    All other components within normal limits  BRAIN NATRIURETIC PEPTIDE - Abnormal; Notable for the following:    B Natriuretic Peptide 251.0 (*)    All other components within normal limits  TROPONIN I    RADIOLOGY Images were viewed by me  Chest x-ray Does not reveal any acute abnormality ____________________________________________  FINAL ASSESSMENT AND PLAN  Dyspnea, edema  Plan: Patient with labs and imaging as dictated above. Patient presents to ER with dyspnea and edema. Heart rate here has been unremarkable at 87. She is in atrial fibrillation which is her baseline. Increase her Lasix gently to 40 mg twice daily for 3 days and she will be on a steroid taper. I will advise  breathing treatments twice daily. She is stable for outpatient follow-up.   Emily FilbertWilliams, Ege Muckey E, MD   Note: This dictation was prepared with Dragon dictation. Any transcriptional errors that result from this process are unintentional   Emily FilbertJonathan E Chlora Mcbain, MD 12/17/15 2325  Emily FilbertJonathan E Dardan Shelton, MD 12/17/15 [phone]

## 2015-12-17 NOTE — ED Notes (Addendum)
[facility] due [facility] bilateral leg swelling that has worsened today. Pt reports having been SOB earlier this morning. [facility] administered 1 duoneb treatment and reports that helped her to feel better. +3 pitting edema noted to both legs and swelling noted to right hand and arm. Pt is dosoriented to time upon arrival.

## 2015-12-18 ENCOUNTER — Encounter: Payer: Medicare Other | Admitting: Surgery

## 2015-12-18 DIAGNOSIS — S41101A Unspecified open wound of right upper arm, initial encounter: Secondary | ICD-10-CM | POA: Diagnosis not present

## 2015-12-18 NOTE — ED Notes (Signed)
Pt transported to [facility] by [facility], sister Norris CrossBetty Williams given DC info

## 2015-12-20 NOTE — Progress Notes (Signed)
Anita, Kirk ([id number]) Visit Report for 12/18/2015 Arrival Information Details Patient Name: [name], [name] Date of Service: 12/18/2015 2:45 PM Medical Record Number: [id number] Patient Account Number: [id number] Date of Birth/Sex: [birth year] (82 y.o. Female) Treating RN: Huel Coventry Primary Care Physician: SYSTEM, PCP Other Clinician: Referring Physician: Treating Physician/Extender: Rudene Re in Treatment: 2 Visit Information History Since Last Visit All ordered tests and consults were completed: No Patient Arrived: Wheel Chair Added or deleted any medications: No Arrival Time: 15:40 Any new allergies or adverse reactions: No Transfer Assistance: Manual Had a fall or experienced change in No Patient Requires Transmission- No activities of daily living that may affect Based Precautions: risk of falls: Patient Has Alerts: Yes Signs or symptoms of abuse/neglect since last No Patient Alerts: Patient on Blood visito [place] Hospitalized since last visit: No ABI: (L) 1.56 Eliquis Pain Present Now: Yes Electronic Signature(s) Signed: 12/20/2015 9:48:25 AM By: Elliot Gurney, RN, BSN, Kim RN, BSN Entered By: Elliot Gurney, RN, BSN, Kim on 12/18/2015 15:41:19 [name] ([id number]) -------------------------------------------------------------------------------- Encounter Discharge Information Details Patient Name: [name] Date of Service: 12/18/2015 2:45 PM Medical Record Number: [id number] Patient Account Number: [id number] Date of Birth/Sex: 04-22-1929 (82 y.o. Female) Treating RN: Huel Coventry Primary Care Physician: SYSTEM, PCP Other Clinician: Referring Physician: Treating Physician/Extender: Rudene Re in Treatment: 2 Encounter Discharge Information Items Discharge Pain Level: 0 Discharge Condition: Stable Ambulatory Status: Wheelchair Discharge Destination: Nursing Home Transportation: Private Auto Accompanied By: sister Schedule Follow-up Appointment:  Yes Medication Reconciliation completed and provided to Patient/Care Yes Labaron Digirolamo: Provided on Clinical Summary of Care: 12/18/2015 Form Type Recipient Paper Patient MJ Electronic Signature(s) Signed: 12/20/2015 9:48:25 AM By: Elliot Gurney RN, BSN, Kim RN, BSN Previous Signature: 12/18/2015 4:54:56 PM Version By: Gwenlyn Perking Entered By: Elliot Gurney RN, BSN, Kim on 12/18/2015 17:30:16 [name] ([id number]) -------------------------------------------------------------------------------- Lower Extremity Assessment Details Patient Name: [name] Date of Service: 12/18/2015 2:45 PM Medical Record Number: [id number] Patient Account Number: [id number] Date of Birth/Sex: Apr 21, 1929 (82 y.o. Female) Treating RN: Huel Coventry Primary Care Physician: SYSTEM, PCP Other Clinician: Referring Physician: Treating Physician/Extender: Rudene Re in Treatment: 2 Edema Assessment Assessed: [Left: Yes] [Right: Yes] Edema: [Left: Yes] [Right: Yes] Calf Left: Right: Point of Measurement: 28 cm From Medial Instep 40 cm 39 cm Ankle Left: Right: Point of Measurement: 10 cm From Medial Instep 28.5 cm 28 cm Vascular Assessment Pulses: Posterior Tibial Palpable: [Left:No] [Right:No] Doppler: [Left:Monophasic] [Right:Monophasic] Dorsalis Pedis Palpable: [Left:No] [Right:No] Doppler: [Left:Monophasic] [Right:Monophasic] Extremity colors, hair growth, and conditions: Hair Growth on Extremity: [Left:No] [Right:No] Temperature of Extremity: [Left:Warm] [Right:Warm] Capillary Refill: [Left:< 3 seconds] [Right:< 3 seconds] Dependent Rubor: [Left:No] [Right:No] Toe Nail Assessment Left: Right: Thick: Yes Yes Electronic Signature(s) Signed: 12/20/2015 9:48:25 AM By: Elliot Gurney, RN, BSN, Kim RN, BSN Entered By: Elliot Gurney, RN, BSN, Kim on 12/18/2015 15:53:33 Wigle, Johnny Bridge ([id number]) -------------------------------------------------------------------------------- Multi Wound Chart Details Patient Name: [name] Date of Service: 12/18/2015 2:45 PM Medical Record Number: [id number] Patient Account Number: [id number] Date of Birth/Sex: Dec 27, 1928 (82 y.o. Female) Treating RN: Huel Coventry Primary Care Physician: SYSTEM, PCP Other Clinician: Referring Physician: Treating Physician/Extender: Rudene Re in Treatment: 2 Vital Signs Height(in): 64 Pulse(bpm): 76 Weight(lbs): 153 Blood Pressure 111/54 (mmHg): Body Mass Index(BMI): 26 Temperature(F): 97.5 Respiratory Rate (breaths/min): Photos: [1:No Photos] [2:No Photos] [3:No Photos] Wound Location: [1:Right Elbow] [2:Right Calcaneus] [3:Right Hand - Dorsum] Wounding Event: [1:Gradually Appeared] [2:Gradually Appeared] [3:Trauma] Primary Etiology: [1:Infection - not elsewhere classified] [2:Pressure Ulcer] [3:Trauma, Other] Comorbid History: [1:Cataracts, Lymphedema, Aspiration, Chronic Obstructive Pulmonary  Disease (COPD), Arrhythmia, Congestive Heart Failure, Hypertension, Peripheral Venous Disease, Osteoarthritis, Dementia] [2:Cataracts, Lymphedema, Aspiration, Chronic  Obstructive Pulmonary Disease (COPD), Arrhythmia, Congestive Heart Failure, Hypertension, Peripheral Venous Disease, Osteoarthritis, Dementia] [3:Cataracts, Lymphedema, Aspiration, Chronic Obstructive Pulmonary Disease (COPD), Arrhythmia, Congestive  Heart Failure, Hypertension, Peripheral Venous Disease, Osteoarthritis, Dementia] Date Acquired: [1:11/14/2015] [2:10/30/2015] [3:12/07/2015] Weeks of Treatment: [1:2] [2:1] [3:1] Wound Status: [1:Open] [2:Open] [3:Open] Measurements L x W x D 3x2x1.4 [2:2.3x3x0.1] [3:0.6x0.5x0.1] (cm) Area (cm) : [1:4.712] [2:5.419] [3:0.236] Volume (cm) : [1:6.597] [2:0.542] [3:0.024] % Reduction in Area: [1:57.10%] [2:-15.00%] [3:72.20%] % Reduction in Volume: 64.70% [2:-15.10%] [3:71.80%] Classification: [1:Full Thickness Without Exposed Support Structures] [2:Unstageable/Unclassified] [3:Partial Thickness] Exudate Amount:  [1:Large] [2:None Present] [3:None Present] Exudate Type: [1:Serosanguineous] [2:N/A] [3:N/A] Exudate Color: [1:red, brown Yes] [2:N/A N/A] [3:N/A N/A] Foul Odor After Cleansing: Odor Anticipated Due to No N/A N/A Product Use: Wound Margin: Flat and Intact Flat and Intact Flat and Intact Granulation Amount: None Present (0%) None Present (0%) Large (67-100%) Granulation Quality: N/A N/A Pink Necrotic Amount: Large (67-100%) Large (67-100%) None Present (0%) Necrotic Tissue: Adherent Slough Eschar N/A Exposed Structures: Fascia: No Fascia: No Fascia: No Fat: No Fat: No Fat: No Tendon: No Tendon: No Tendon: No Muscle: No Muscle: No Muscle: No Joint: No Joint: No Joint: No Bone: No Bone: No Bone: No Limited to Skin Limited to Skin Limited to Skin Breakdown Breakdown Breakdown Epithelialization: None N/A Large (67-100%) Periwound Skin Texture: Edema: Yes Edema: No Edema: No Excoriation: No Excoriation: No Induration: No Induration: No Callus: No Callus: No Crepitus: No Crepitus: No Fluctuance: No Fluctuance: No Friable: No Friable: No Rash: No Rash: No Scarring: No Scarring: No Periwound Skin Moist: Yes Dry/Scaly: Yes Moist: Yes Moisture: Maceration: No Maceration: No Moist: No Dry/Scaly: No Periwound Skin Color: Erythema: Yes Atrophie Blanche: No Atrophie Blanche: No Cyanosis: No Cyanosis: No Ecchymosis: No Ecchymosis: No Erythema: No Erythema: No Hemosiderin Staining: No Hemosiderin Staining: No Mottled: No Mottled: No Pallor: No Pallor: No Rubor: No Rubor: No Erythema Location: Circumferential N/A N/A Temperature: No Abnormality N/A N/A Tenderness on Yes No No Palpation: Wound Preparation: Ulcer Cleansing: Ulcer Cleansing: Not Ulcer Cleansing: Rinsed/Irrigated with Cleansed Rinsed/Irrigated with Saline Saline Topical Anesthetic Topical Anesthetic Applied: None Topical Anesthetic Applied: Other: lidocaine Applied: None 4% Car,  Jamelah ([id number]) Wound Number: 4 5 N/A Photos: No Photos No Photos N/A Wound Location: Right Forearm Left Lower Leg N/A Wounding Event: Trauma Gradually Appeared N/A Primary Etiology: Trauma, Other Pressure Ulcer N/A Comorbid History: N/A Cataracts, Lymphedema, N/A Aspiration, Chronic Obstructive Pulmonary Disease (COPD), Arrhythmia, Congestive Heart Failure, Hypertension, Peripheral Venous Disease, Osteoarthritis, Dementia Date Acquired: 12/07/2015 12/13/2015 N/A Weeks of Treatment: 1 0 N/A Wound Status: Open Open N/A Measurements L x W x D 0.8x0.5x0.1 3x1.5x0.1 N/A (cm) Area (cm) : 0.314 3.534 N/A Volume (cm) : 0.031 0.353 N/A % Reduction in Area: 60.00% N/A N/A % Reduction in Volume: 60.80% N/A N/A Classification: Partial Thickness Category/Stage II N/A Exudate Amount: N/A Large N/A Exudate Type: N/A Serous N/A Exudate Color: N/A amber N/A Foul Odor After N/A No N/A Cleansing: Odor Anticipated Due to N/A N/A N/A Product Use: Wound Margin: N/A Indistinct, nonvisible N/A Granulation Amount: N/A Medium (34-66%) N/A Granulation Quality: N/A Red N/A Necrotic Amount: N/A Small (1-33%) N/A Necrotic Tissue: N/A Adherent Slough N/A Exposed Structures: N/A Fascia: No N/A Fat: No Tendon: No Muscle: No Joint: No Bone: No Limited to Skin Breakdown Epithelialization: N/A N/A N/A Periwound Skin Texture: No Abnormalities Noted Edema: No N/A Excoriation: No Induration:  No Callus: No Skalsky, Sarabelle ([id number]) Crepitus: No Fluctuance: No Friable: No Rash: No Scarring: No Periwound Skin No Abnormalities Noted Maceration: No N/A Moisture: Moist: No Dry/Scaly: No Periwound Skin Color: No Abnormalities Noted Atrophie Blanche: No N/A Cyanosis: No Ecchymosis: No Erythema: No Hemosiderin Staining: No Mottled: No Pallor: No Rubor: No Erythema Location: N/A N/A N/A Temperature: N/A N/A N/A Tenderness on No No N/A Palpation: Wound Preparation: N/A Ulcer Cleansing:  N/A Rinsed/Irrigated with Saline Topical Anesthetic Applied: Xylocaine 4% Topical Solution Treatment Notes Electronic Signature(s) Signed: 12/20/2015 9:48:25 AM By: Elliot Gurney, RN, BSN, Kim RN, BSN Entered By: Elliot Gurney, RN, BSN, Kim on 12/18/2015 16:33:52 [name] ([id number]) -------------------------------------------------------------------------------- Multi-Disciplinary Care Plan Details Patient Name: [name] Date of Service: 12/18/2015 2:45 PM Medical Record Number: [id number] Patient Account Number: [id number] Date of Birth/Sex: 1928-07-17 (82 y.o. Female) Treating RN: Huel Coventry Primary Care Physician: SYSTEM, PCP Other Clinician: Referring Physician: Treating Physician/Extender: Rudene Re in Treatment: 2 Active Inactive Abuse / Safety / Falls / Self Care Management Nursing Diagnoses: Potential for falls Goals: Patient will remain injury free Date Initiated: 12/04/2015 Goal Status: Active Interventions: Assess fall risk on admission and as needed Notes: Nutrition Nursing Diagnoses: Potential for alteratiion in Nutrition/Potential for imbalanced nutrition Goals: Patient/caregiver agrees to and verbalizes understanding of need to obtain nutritional consultation Date Initiated: 12/04/2015 Goal Status: Active Interventions: Provide education on nutrition Notes: Orientation to the Wound Care Program Nursing Diagnoses: Knowledge deficit related to the wound healing center program Goals: Patient/caregiver will verbalize understanding of the Wound Healing Center Program Date Initiated: 12/04/2015 TORUNN, CHANCELLOR ([id number]) Goal Status: Active Interventions: Provide education on orientation to the wound center Notes: Venous Leg Ulcer Nursing Diagnoses: Actual venous Insuffiency (use after diagnosis is confirmed) Goals: Verify adequate tissue perfusion prior to therapeutic compression application Date Initiated: 12/04/2015 Goal Status:  Active Interventions: Provide education on venous insufficiency Notes: Wound/Skin Impairment Nursing Diagnoses: Impaired tissue integrity Goals: Ulcer/skin breakdown will heal within 14 weeks Date Initiated: 12/04/2015 Goal Status: Active Interventions: Assess patient/caregiver ability to obtain necessary supplies Notes: Electronic Signature(s) Signed: 12/20/2015 9:48:25 AM By: Elliot Gurney, RN, BSN, Kim RN, BSN Entered By: Elliot Gurney, RN, BSN, Kim on 12/18/2015 16:33:43 Hoyt, Johnny Bridge ([id number]) -------------------------------------------------------------------------------- Non-Wound Condition Assessment Details Patient Name: [name] Date of Service: 12/18/2015 2:45 PM Medical Record Number: [id number] Patient Account Number: [id number] Date of Birth/Sex: [birth year] (82 y.o. Female) Treating RN: Huel Coventry Primary Care Physician: SYSTEM, PCP Other Clinician: Referring Physician: Treating Physician/Extender: Rudene Re in Treatment: 2 Non-Wound Condition: Condition: Suspected Deep Tissue Injury Location: Leg Side: Right Photos Periwound Skin Texture Texture Color No Abnormalities Noted: No No Abnormalities Noted: No Callus: No Atrophie Blanche: No Crepitus: No Cyanosis: No Excoriation: No Ecchymosis: No Fluctuance: No Erythema: Yes Friable: No Erythema Location: Circumferential Induration: No Hemosiderin Staining: No Localized Edema: No Mottled: No Rash: No Pallor: No Scarring: No Rubor: No Moisture Temperature / Pain No Abnormalities Noted: No Temperature: No Abnormality Dry / Scaly: No Maceration: No Moist: No Electronic Signature(s) Signed: 12/20/2015 9:48:25 AM By: Elliot Gurney, RN, BSN, Kim RN, BSN Entered By: Elliot Gurney, RN, BSN, Kim on 12/18/2015 17:38:43 Tiberio, Yajaira ([id number]) [name] ([id number]) -------------------------------------------------------------------------------- Pain Assessment Details Patient Name: [name] Date of Service:  12/18/2015 2:45 PM Medical Record Number: [id number] Patient Account Number: [id number] Date of Birth/Sex: [birth year] (82 y.o. Female) Treating RN: Huel Coventry Primary Care Physician: SYSTEM, PCP Other Clinician: Referring Physician: Treating Physician/Extender: Rudene Re in Treatment: 2 Active Problems Location of Pain Severity and Description of  Pain Patient Has Paino No Site Locations With Dressing Change: No Pain Management and Medication Current Pain Management: Electronic Signature(s) Signed: 12/20/2015 9:48:25 AM By: Elliot Gurney, RN, BSN, Kim RN, BSN Entered By: Elliot Gurney, RN, BSN, Kim on 12/18/2015 15:48:33 [name] ([id number]) -------------------------------------------------------------------------------- Patient/Caregiver Education Details Patient Name: [name] Date of Service: 12/18/2015 2:45 PM Medical Record Number: [id number] Patient Account Number: [id number] Date of Birth/Gender: [birth year] (82 y.o. Female) Treating RN: Huel Coventry Primary Care Physician: SYSTEM, PCP Other Clinician: Referring Physician: Treating Physician/Extender: Rudene Re in Treatment: 2 Education Assessment Education Provided To: Patient Education Topics Provided Wound/Skin Impairment: Handouts: Other: wound care as prescribed Methods: Demonstration Responses: State content correctly Electronic Signature(s) Signed: 12/20/2015 9:48:25 AM By: Elliot Gurney, RN, BSN, Kim RN, BSN Entered By: Elliot Gurney, RN, BSN, Kim on 12/18/2015 17:30:33 [name] ([id number]) -------------------------------------------------------------------------------- Wound Assessment Details Patient Name: [name] Date of Service: 12/18/2015 2:45 PM Medical Record Number: [id number] Patient Account Number: [id number] Date of Birth/Sex: [birth year] (82 y.o. Female) Treating RN: Huel Coventry Primary Care Physician: SYSTEM, PCP Other Clinician: Referring Physician: Treating Physician/Extender: Rudene Re in Treatment: 2 Wound Status Wound Number: 1 Primary Infection - not elsewhere classified Etiology: Wound Location: Right Elbow Wound Open Wounding Event: Gradually Appeared Status: Date Acquired: 11/14/2015 Comorbid Cataracts, Lymphedema, Aspiration, Weeks Of Treatment: 2 History: Chronic Obstructive Pulmonary Disease Clustered Wound: No (COPD), Arrhythmia, Congestive Heart Failure, Hypertension, Peripheral Venous Disease, Osteoarthritis, Dementia Photos Photo Uploaded By: Elliot Gurney, RN, BSN, Kim on 12/18/2015 17:37:31 Wound Measurements Length: (cm) 3 Width: (cm) 2 Depth: (cm) 1.4 Area: (cm) 4.712 Volume: (cm) 6.597 % Reduction in Area: 57.1% % Reduction in Volume: 64.7% Epithelialization: None Wound Description Full Thickness Without Exposed Foul Odor Af Classification: Support Structures Due to Produ Wound Margin: Flat and Intact Exudate Large Amount: Exudate Type: Serosanguineous Exudate Color: red, brown ter Cleansing: Yes ct Use: No Wound Bed Chiappetta, Melani ([id number]) Granulation Amount: None Present (0%) Exposed Structure Necrotic Amount: Large (67-100%) Fascia Exposed: No Necrotic Quality: Adherent Slough Fat Layer Exposed: No Tendon Exposed: No Muscle Exposed: No Joint Exposed: No Bone Exposed: No Limited to Skin Breakdown Periwound Skin Texture Texture Color No Abnormalities Noted: No No Abnormalities Noted: No Localized Edema: Yes Erythema: Yes Erythema Location: Circumferential Moisture No Abnormalities Noted: No Temperature / Pain Moist: Yes Temperature: No Abnormality Tenderness on Palpation: Yes Wound Preparation Ulcer Cleansing: Rinsed/Irrigated with Saline Topical Anesthetic Applied: Other: lidocaine 4%, Treatment Notes Wound #1 (Right Elbow) 1. Cleansed with: Clean wound with Normal Saline 2. Anesthetic Topical Lidocaine 4% cream to wound bed prior to debridement 4. Dressing Applied: Santyl Ointment 5. Secondary  Dressing Applied Bordered Foam Dressing Electronic Signature(s) Signed: 12/20/2015 9:48:25 AM By: Elliot Gurney, RN, BSN, Kim RN, BSN Entered By: Elliot Gurney, RN, BSN, Kim on 12/18/2015 16:00:46 [name] ([id number]) -------------------------------------------------------------------------------- Wound Assessment Details Patient Name: [name] Date of Service: 12/18/2015 2:45 PM Medical Record Number: [id number] Patient Account Number: [id number] Date of Birth/Sex: [birth year] (82 y.o. Female) Treating RN: Huel Coventry Primary Care Physician: SYSTEM, PCP Other Clinician: Referring Physician: Treating Physician/Extender: Rudene Re in Treatment: 2 Wound Status Wound Number: 2 Primary Pressure Ulcer Etiology: Wound Location: Right Calcaneus Wound Open Wounding Event: Gradually Appeared Status: Date Acquired: 10/30/2015 Comorbid Cataracts, Lymphedema, Aspiration, Weeks Of Treatment: 1 History: Chronic Obstructive Pulmonary Disease Clustered Wound: No (COPD), Arrhythmia, Congestive Heart Failure, Hypertension, Peripheral Venous Disease, Osteoarthritis, Dementia Photos Photo Uploaded By: Elliot Gurney, RN, BSN, Kim on 12/18/2015 17:37:31 Wound Measurements Length: (cm) 2.3 Width: (cm) 3 Depth: (cm)  0.1 Area: (cm) 5.419 Volume: (cm) 0.542 % Reduction in Area: -15% % Reduction in Volume: -15.1% Wound Description Classification: Unstageable/Unclassified Wound Margin: Flat and Intact Exudate Amount: None Present Wound Bed Granulation Amount: None Present (0%) Exposed Structure Necrotic Amount: Large (67-100%) Fascia Exposed: No Necrotic Quality: Eschar Fat Layer Exposed: No Tendon Exposed: No Papillion, Cheree ([id number]) Muscle Exposed: No Joint Exposed: No Bone Exposed: No Limited to Skin Breakdown Periwound Skin Texture Texture Color No Abnormalities Noted: No No Abnormalities Noted: No Callus: No Atrophie Blanche: No Crepitus: No Cyanosis: No Excoriation: No Ecchymosis:  No Fluctuance: No Erythema: No Friable: No Hemosiderin Staining: No Induration: No Mottled: No Localized Edema: No Pallor: No Rash: No Rubor: No Scarring: No Moisture No Abnormalities Noted: No Dry / Scaly: Yes Maceration: No Moist: No Wound Preparation Ulcer Cleansing: Not Cleansed Topical Anesthetic Applied: None Treatment Notes Wound #2 (Right Calcaneus) 1. Cleansed with: Clean wound with Normal Saline 4. Dressing Applied: Other dressing (specify in notes) 5. Secondary Dressing Applied Bordered Foam Dressing Notes Betadine paint and BFD Electronic Signature(s) Signed: 12/20/2015 9:48:25 AM By: Elliot GurneyWoody, RN, BSN, Kim RN, BSN Entered By: Elliot GurneyWoody, RN, BSN, Kim on 12/18/2015 16:02:40 Anita BubaJOBE, Karsynn ([id number]) -------------------------------------------------------------------------------- Wound Assessment Details Patient Name: Anita BubaJOBE, Issabelle Date of Service: 12/18/2015 2:45 PM Medical Record Number: [id number] Patient Account Number: [id number] Date of Birth/Sex: Nov 25, 1928 (82 y.o. Female) Treating RN: Huel CoventryWoody, Kim Primary Care Physician: SYSTEM, PCP Other Clinician: Referring Physician: Treating Physician/Extender: Rudene ReBritto, Errol Weeks in Treatment: 2 Wound Status Wound Number: 3 Primary Trauma, Other Etiology: Wound Location: Right Hand - Dorsum Wound Open Wounding Event: Trauma Status: Date Acquired: 12/07/2015 Comorbid Cataracts, Lymphedema, Aspiration, Weeks Of Treatment: 1 History: Chronic Obstructive Pulmonary Disease Clustered Wound: No (COPD), Arrhythmia, Congestive Heart Failure, Hypertension, Peripheral Venous Disease, Osteoarthritis, Dementia Photos Photo Uploaded By: Elliot GurneyWoody, RN, BSN, Kim on 12/18/2015 17:37:59 Wound Measurements Length: (cm) 0.6 Width: (cm) 0.5 Depth: (cm) 0.1 Area: (cm) 0.236 Volume: (cm) 0.024 % Reduction in Area: 72.2% % Reduction in Volume: 71.8% Epithelialization: Large (67-100%) Wound Description Classification: Partial  Thickness Wound Margin: Flat and Intact Exudate Amount: None Present Wound Bed Granulation Amount: Large (67-100%) Exposed Structure Granulation Quality: Pink Fascia Exposed: No Necrotic Amount: None Present (0%) Fat Layer Exposed: No Tendon Exposed: No Trager, Georgean ([id number]) Muscle Exposed: No Joint Exposed: No Bone Exposed: No Limited to Skin Breakdown Periwound Skin Texture Texture Color No Abnormalities Noted: No No Abnormalities Noted: No Callus: No Atrophie Blanche: No Crepitus: No Cyanosis: No Excoriation: No Ecchymosis: No Fluctuance: No Erythema: No Friable: No Hemosiderin Staining: No Induration: No Mottled: No Localized Edema: No Pallor: No Rash: No Rubor: No Scarring: No Moisture No Abnormalities Noted: No Dry / Scaly: No Maceration: No Moist: Yes Wound Preparation Ulcer Cleansing: Rinsed/Irrigated with Saline Topical Anesthetic Applied: None Treatment Notes Wound #3 (Right Hand - Dorsum) 5. Secondary Dressing Applied Bordered Foam Dressing Electronic Signature(s) Signed: 12/20/2015 9:48:25 AM By: Elliot GurneyWoody, RN, BSN, Kim RN, BSN Entered By: Elliot GurneyWoody, RN, BSN, Kim on 12/18/2015 16:03:02 Anita BubaJOBE, Charise ([id number]) -------------------------------------------------------------------------------- Wound Assessment Details Patient Name: Anita BubaJOBE, Renezmae Date of Service: 12/18/2015 2:45 PM Medical Record Number: [id number] Patient Account Number: [id number] Date of Birth/Sex: Nov 25, 1928 (82 y.o. Female) Treating RN: Huel CoventryWoody, Kim Primary Care Physician: SYSTEM, PCP Other Clinician: Referring Physician: Treating Physician/Extender: Rudene ReBritto, Errol Weeks in Treatment: 2 Wound Status Wound Number: 4 Primary Etiology: Trauma, Other Wound Location: Right Forearm Wound Status: Open Wounding Event: Trauma Date Acquired: 12/07/2015 Weeks Of Treatment: 1 Clustered Wound: No Photos Photo Uploaded  ByElliot Gurney, RN, BSN, Kim on 12/18/2015 17:37:59 Wound Measurements Length:  (cm) 0.8 Width: (cm) 0.5 Depth: (cm) 0.1 Area: (cm) 0.314 Volume: (cm) 0.031 % Reduction in Area: 60% % Reduction in Volume: 60.8% Wound Description Classification: Partial Thickness Periwound Skin Texture Texture Color No Abnormalities Noted: No No Abnormalities Noted: No Moisture No Abnormalities Noted: No Treatment Notes Wound #4 (Right Forearm) 5. Secondary Dressing Applied GENESI, STEFANKO ([id number]) Bordered Foam Dressing Electronic Signature(s) Signed: 12/20/2015 9:48:25 AM By: Elliot Gurney RN, BSN, Kim RN, BSN Entered By: Elliot Gurney, RN, BSN, Kim on 12/18/2015 16:02:08 [name] ([id number]) -------------------------------------------------------------------------------- Wound Assessment Details Patient Name: [name] Date of Service: 12/18/2015 2:45 PM Medical Record Number: [id number] Patient Account Number: [id number] Date of Birth/Sex: 1929/01/30 (82 y.o. Female) Treating RN: Huel Coventry Primary Care Physician: SYSTEM, PCP Other Clinician: Referring Physician: Treating Physician/Extender: Rudene Re in Treatment: 2 Wound Status Wound Number: 5 Primary Pressure Ulcer Etiology: Wound Location: Left Lower Leg Wound Open Wounding Event: Gradually Appeared Status: Date Acquired: 12/13/2015 Comorbid Cataracts, Lymphedema, Aspiration, Weeks Of Treatment: 0 History: Chronic Obstructive Pulmonary Disease Clustered Wound: No (COPD), Arrhythmia, Congestive Heart Failure, Hypertension, Peripheral Venous Disease, Osteoarthritis, Dementia Photos Photo Uploaded By: Elliot Gurney, RN, BSN, Kim on 12/18/2015 17:38:14 Wound Measurements Length: (cm) 3 % Reduction Width: (cm) 1.5 % Reduction Depth: (cm) 0.1 Area: (cm) 3.534 Volume: (cm) 0.353 in Area: in Volume: Wound Description Classification: Category/Stage II Wound Margin: Indistinct, nonvisible Exudate Amount: Large Exudate Type: Serous Exudate Color: amber Foul Odor After Cleansing: No Wound  Bed Granulation Amount: Medium (34-66%) Exposed Structure Granulation Quality: Red Fascia Exposed: No Kuhl, Ailsa ([id number]) Necrotic Amount: Small (1-33%) Fat Layer Exposed: No Necrotic Quality: Adherent Slough Tendon Exposed: No Muscle Exposed: No Joint Exposed: No Bone Exposed: No Limited to Skin Breakdown Periwound Skin Texture Texture Color No Abnormalities Noted: No No Abnormalities Noted: No Callus: No Atrophie Blanche: No Crepitus: No Cyanosis: No Excoriation: No Ecchymosis: No Fluctuance: No Erythema: No Friable: No Hemosiderin Staining: No Induration: No Mottled: No Localized Edema: No Pallor: No Rash: No Rubor: No Scarring: No Moisture No Abnormalities Noted: No Dry / Scaly: No Maceration: No Moist: No Wound Preparation Ulcer Cleansing: Rinsed/Irrigated with Saline Topical Anesthetic Applied: Xylocaine 4% Topical Solution Treatment Notes Wound #5 (Left Lower Leg) 1. Cleansed with: Clean wound with Normal Saline 4. Dressing Applied: Aquacel Ag 5. Secondary Dressing Applied Bordered Foam Dressing Electronic Signature(s) Signed: 12/20/2015 9:48:25 AM By: Elliot Gurney, RN, BSN, Kim RN, BSN Entered By: Elliot Gurney, RN, BSN, Kim on 12/18/2015 16:10:10 [name] ([id number]) -------------------------------------------------------------------------------- Vitals Details Patient Name: [name] Date of Service: 12/18/2015 2:45 PM Medical Record Number: [id number] Patient Account Number: [id number] Date of Birth/Sex: 07-24-1928 (82 y.o. Female) Treating RN: Huel Coventry Primary Care Physician: SYSTEM, PCP Other Clinician: Referring Physician: Treating Physician/Extender: Rudene Re in Treatment: 2 Vital Signs Time Taken: 03:41 Temperature (F): 97.5 Height (in): 64 Pulse (bpm): 76 Weight (lbs): 153 Blood Pressure (mmHg): 111/54 Body Mass Index (BMI): 26.3 Reference Range: 80 - 120 mg / dl Electronic Signature(s) Signed: 12/20/2015 9:48:25 AM  By: Elliot Gurney, RN, BSN, Kim RN, BSN Entered By: Elliot Gurney, RN, BSN, Kim on 12/18/2015 15:42:15

## 2015-12-20 NOTE — Progress Notes (Signed)
RAEANN, OFFNER ([id number]) Visit Report for 12/18/2015 Chief Complaint Document Details Patient Name: [name], [name] Date of Service: 12/18/2015 2:45 PM Medical Record Number: [id number] Patient Account Number: [id number] Date of Birth/Sex: 1929/04/27 (82 y.o. Female) Treating RN: Huel Coventry Primary Care Physician: SYSTEM, PCP Other Clinician: Referring Physician: Treating Physician/Extender: Rudene Re in Treatment: 2 Information Obtained from: Patient Chief Complaint Patient seen for complaints of Non-Healing Wound to the right upper arm laterally near the elbow for about a month. She also has some skin changes on her left heel. Electronic Signature(s) Signed: 12/18/2015 4:57:49 PM By: Evlyn Kanner MD, FACS Entered By: Evlyn Kanner on 12/18/2015 16:57:49 [name] ([id number]) -------------------------------------------------------------------------------- Debridement Details Patient Name: [name] Date of Service: 12/18/2015 2:45 PM Medical Record Number: [id number] Patient Account Number: [id number] Date of Birth/Sex: June 06, 1929 (82 y.o. Female) Treating RN: Huel Coventry Primary Care Physician: SYSTEM, PCP Other Clinician: Referring Physician: Treating Physician/Extender: Rudene Re in Treatment: 2 Debridement Performed for Wound #1 Right Elbow Assessment: Performed By: Physician Evlyn Kanner, MD Debridement: Debridement Pre-procedure Yes Verification/Time Out Taken: Start Time: 16:38 Pain Control: Other : lidocaine 4% Level: Skin/Subcutaneous Tissue Total Area Debrided (L x 3 (cm) x 2 (cm) = 6 (cm) W): Tissue and other Viable, Non-Viable, Fat, Fibrin/Slough, Subcutaneous material debrided: Instrument: Forceps, Scissors Bleeding: Moderate Hemostasis Achieved: Pressure End Time: 16:45 Procedural Pain: 0 Post Procedural Pain: 0 Response to Treatment: Procedure was tolerated well Post Debridement Measurements of Total Wound Length: (cm)  3 Width: (cm) 2 Depth: (cm) 1.4 Volume: (cm) 6.597 Post Procedure Diagnosis Same as Pre-procedure Electronic Signature(s) Signed: 12/18/2015 4:57:41 PM By: Evlyn Kanner MD, FACS Signed: 12/20/2015 9:48:25 AM By: Elliot Gurney RN, BSN, Kim RN, BSN Entered By: Evlyn Kanner on 12/18/2015 16:57:41 [name], [name] ([id number]) -------------------------------------------------------------------------------- HPI Details Patient Name: [name] Date of Service: 12/18/2015 2:45 PM Medical Record Number: [id number] Patient Account Number: [id number] Date of Birth/Sex: 1929/02/12 (82 y.o. Female) Treating RN: Huel Coventry Primary Care Physician: SYSTEM, PCP Other Clinician: Referring Physician: Treating Physician/Extender: Rudene Re in Treatment: 2 History of Present Illness Location: wound on her right arm laterally near the elbow Quality: Patient reports experiencing a dull pain to affected area(s). Severity: Patient states wound are getting worse. Duration: Patient has had the wound for < 4 weeks prior to presenting for treatment Timing: Pain in wound is Intermittent (comes and goes Context: The wound appeared gradually over time Modifying Factors: Other treatment(s) tried include:local care with dressing changes and has recently been on clindamycin Associated Signs and Symptoms: Patient reports having increase swelling all over the body including upper arms and legs. HPI Description: 82 year old patient was been referred to [place] for a ulcerated area on her right elbow. She has a past medical history of CHF, atrial fibrillation, hypertension, gait instability, bilateral lower extremity edema, morbid obesity, altered mental status. She has never been a smoker. her cardiologist is Dr. Julien Nordmann, who last saw her in March 2017.he has been seeing her with a history of hypertension, gait instability, cognitive impairment, A. fib with RVR and chronic systolic CHF. she is on Eliquis 5 mg  twice a day and for the bilateral lower extremity lymphedema he had recommended elevation and compression hose. 12/11/2015 -- x-ray of the right elbow shows no evidence of fracture or bony pathology and the joint spaces are intact. The patient has held her anticoagulation for the last 72 hours. 12/18/2015 -- the patient was seen in the ER for fluid overload and breathlessness and appropriate management was  done including increasing her dosage of Lasix after an appropriate workup. Details of this have been noted from the ER record -- chest x-ray did not reveal any acute abnormality and the labs were reviewed. She was put on Lasix 40 mg twice a day for 3 days and she will be on a steroid taper. She was also advised breathing treatments twice a day and to follow-up with her cardiologist. Electronic Signature(s) Signed: 12/18/2015 5:01:25 PM By: Evlyn Kanner MD, FACS Entered By: Evlyn Kanner on 12/18/2015 17:01:25 [name] ([id number]) -------------------------------------------------------------------------------- Physical Exam Details Patient Name: [name] Date of Service: 12/18/2015 2:45 PM Medical Record Number: [id number] Patient Account Number: [id number] Date of Birth/Sex: [birth year] (82 y.o. Female) Treating RN: Huel Coventry Primary Care Physician: SYSTEM, PCP Other Clinician: Referring Physician: Treating Physician/Extender: Rudene Re in Treatment: 2 Constitutional . Pulse regular. Respirations normal and unlabored. Afebrile. . Eyes Nonicteric. Reactive to light. Ears, Nose, Mouth, and Throat Lips, teeth, and gums WNL.Marland Kitchen Moist mucosa without lesions. Neck supple and nontender. No palpable supraclavicular or cervical adenopathy. Normal sized without goiter. Respiratory WNL. No retractions.. Cardiovascular Pedal Pulses WNL. pulses are palpable bilaterally. she has got +3 pitting edema both lower extremities and feet. Lymphatic No adneopathy. No adenopathy. No  adenopathy. Musculoskeletal Adexa without tenderness or enlargement.. Digits and nails w/o clubbing, cyanosis, infection, petechiae, ischemia, or inflammatory conditions.. Integumentary (Hair, Skin) No suspicious lesions. No crepitus or fluctuance. No peri-wound warmth or erythema. No masses.Marland Kitchen Psychiatric Judgement and insight Intact.. No evidence of depression, anxiety, or agitation.. Notes the right arm wound again need sharp debridement with a forcep and scissors and this has been done without much bleeding. She has a couple of new wounds on the right and left lower extremity from abrasions due to her TED hose. the superficial ulceration on her right forearm and hand have improved. The left heel continues to have a dry eschar which is not open at the present time Electronic Signature(s) Signed: 12/18/2015 5:02:50 PM By: Evlyn Kanner MD, FACS Entered By: Evlyn Kanner on 12/18/2015 17:02:50 [name] ([id number]) -------------------------------------------------------------------------------- Physician Orders Details Patient Name: [name] Date of Service: 12/18/2015 2:45 PM Medical Record Number: [id number] Patient Account Number: [id number] Date of Birth/Sex: [birth year] (82 y.o. Female) Treating RN: Huel Coventry Primary Care Physician: SYSTEM, PCP Other Clinician: Referring Physician: Treating Physician/Extender: Rudene Re in Treatment: 2 Verbal / Phone Orders: Yes Clinician: Huel Coventry Read Back and Verified: Yes Diagnosis Coding Wound Cleansing Wound #1 Right Elbow o Clean wound with Normal Saline. Wound #2 Right Calcaneus o Clean wound with Normal Saline. Wound #3 Right Hand - Dorsum o Clean wound with Normal Saline. Wound #4 Right Forearm o Clean wound with Normal Saline. Wound #5 Left Lower Leg o Clean wound with Normal Saline. Anesthetic Wound #1 Right Elbow o Topical Lidocaine 4% cream applied to wound bed prior to debridement Wound #3  Right Hand - Dorsum o Topical Lidocaine 4% cream applied to wound bed prior to debridement Primary Wound Dressing Wound #1 Right Elbow o Santyl Ointment Wound #2 Right Calcaneus o Other: - betadine paint Wound #3 Right Hand - Dorsum o Boardered Foam Dressing Wound #4 Right Forearm o Boardered Foam Dressing Secondary Dressing [name], [name] ([id number]) Wound #1 Right Elbow o Boardered Foam Dressing Wound #2 Right Calcaneus o Boardered Foam Dressing Dressing Change Frequency Wound #1 Right Elbow o Change dressing every day. Wound #2 Right Calcaneus o Change dressing every day. Wound #3 Right Hand - Dorsum o Change Dressing Monday,  Wednesday, Friday Wound #4 Right Forearm o Change Dressing Monday, Wednesday, Friday Wound #5 Left Lower Leg o Change Dressing Monday, Wednesday, Friday Follow-up Appointments Wound #1 Right Elbow o Return Appointment in 1 week. Wound #2 Right Calcaneus o Return Appointment in 1 week. Wound #3 Right Hand - Dorsum o Return Appointment in 1 week. Wound #4 Right Forearm o Return Appointment in 1 week. Wound #5 Left Lower Leg o Return Appointment in 1 week. Edema Control Wound #2 Right Calcaneus o Elevate legs to the level of the heart and pump ankles as often as possible - Float heels, elevate legs on a couple pillows when in bed. Sage boots. Additional Orders / Instructions Wound #1 Right Elbow o Increase protein intake. Wound #2 Right Calcaneus [name], [name] ([id number]) o Increase protein intake. Wound #3 Right Hand - Dorsum o Increase protein intake. Wound #4 Right Forearm o Increase protein intake. Wound #5 Left Lower Leg o Increase protein intake. Home Health Wound #1 Right Elbow o Continue Home Health Visits o Home Health Nurse may visit PRN to address patientos wound care needs. o FACE TO FACE ENCOUNTER: MEDICARE and MEDICAID PATIENTS: I certify that this patient is under my care  and that I had a face-to-face encounter that meets the physician face-to-face encounter requirements with this patient on this date. The encounter with the patient was in whole or in part for the following MEDICAL CONDITION: (primary reason for Home Healthcare) MEDICAL NECESSITY: I certify, that based on my findings, NURSING services are a medically necessary home health service. HOME BOUND STATUS: I certify that my clinical findings support that this patient is homebound (i.e., Due to illness or injury, pt requires aid of supportive devices such as crutches, cane, wheelchairs, walkers, the use of special transportation or the assistance of another person to leave their place of residence. There is a normal inability to leave the home and doing so requires considerable and taxing effort. Other absences are for medical reasons / religious services and are infrequent or of short duration when for other reasons). o If current dressing causes regression in wound condition, may D/C ordered dressing product/s and apply Normal Saline Moist Dressing daily until next Wound Healing Center / Other MD appointment. Notify Wound Healing Center of regression in wound condition at [phone]. o Please direct any NON-WOUND related issues/requests for orders to patient's Primary Care Physician Wound #2 Right Calcaneus o Continue Home Health Visits o Home Health Nurse may visit PRN to address patientos wound care needs. o FACE TO FACE ENCOUNTER: MEDICARE and MEDICAID PATIENTS: I certify that this patient is under my care and that I had a face-to-face encounter that meets the physician face-to-face encounter requirements with this patient on this date. The encounter with the patient was in whole or in part for the following MEDICAL CONDITION: (primary reason for Home Healthcare) MEDICAL NECESSITY: I certify, that based on my findings, NURSING services are a medically necessary home health service.  HOME BOUND STATUS: I certify that my clinical findings support that this patient is homebound (i.e., Due to illness or injury, pt requires aid of supportive devices such as crutches, cane, wheelchairs, walkers, the use of special transportation or the assistance of another person to leave their place of residence. There is a normal inability to leave the home and doing so requires considerable and taxing effort. Other absences are for medical reasons / religious services and are infrequent or of short duration when for other reasons). [name], [name] ([id number]) o If  current dressing causes regression in wound condition, may D/C ordered dressing product/s and apply Normal Saline Moist Dressing daily until next Wound Healing Center / Other MD appointment. Notify Wound Healing Center of regression in wound condition at [phone]. o Please direct any NON-WOUND related issues/requests for orders to patient's Primary Care Physician Wound #3 Right Hand - Dorsum o Continue Home Health Visits o Home Health Nurse may visit PRN to address patientos wound care needs. o FACE TO FACE ENCOUNTER: MEDICARE and MEDICAID PATIENTS: I certify that this patient is under my care and that I had a face-to-face encounter that meets the physician face-to-face encounter requirements with this patient on this date. The encounter with the patient was in whole or in part for the following MEDICAL CONDITION: (primary reason for Home Healthcare) MEDICAL NECESSITY: I certify, that based on my findings, NURSING services are a medically necessary home health service. HOME BOUND STATUS: I certify that my clinical findings support that this patient is homebound (i.e., Due to illness or injury, pt requires aid of supportive devices such as crutches, cane, wheelchairs, walkers, the use of special transportation or the assistance of another person to leave their place of residence. There is a normal inability to leave  the home and doing so requires considerable and taxing effort. Other absences are for medical reasons / religious services and are infrequent or of short duration when for other reasons). o If current dressing causes regression in wound condition, may D/C ordered dressing product/s and apply Normal Saline Moist Dressing daily until next Wound Healing Center / Other MD appointment. Notify Wound Healing Center of regression in wound condition at [phone]. o Please direct any NON-WOUND related issues/requests for orders to patient's Primary Care Physician Wound #4 Right Forearm o Continue Home Health Visits o Home Health Nurse may visit PRN to address patientos wound care needs. o FACE TO FACE ENCOUNTER: MEDICARE and MEDICAID PATIENTS: I certify that this patient is under my care and that I had a face-to-face encounter that meets the physician face-to-face encounter requirements with this patient on this date. The encounter with the patient was in whole or in part for the following MEDICAL CONDITION: (primary reason for Home Healthcare) MEDICAL NECESSITY: I certify, that based on my findings, NURSING services are a medically necessary home health service. HOME BOUND STATUS: I certify that my clinical findings support that this patient is homebound (i.e., Due to illness or injury, pt requires aid of supportive devices such as crutches, cane, wheelchairs, walkers, the use of special transportation or the assistance of another person to leave their place of residence. There is a normal inability to leave the home and doing so requires considerable and taxing effort. Other absences are for medical reasons / religious services and are infrequent or of short duration when for other reasons). o If current dressing causes regression in wound condition, may D/C ordered dressing product/s and apply Normal Saline Moist Dressing daily until next Wound Healing Center / Other MD appointment.  Notify Wound Healing Center of regression in wound condition at [phone]. o Please direct any NON-WOUND related issues/requests for orders to patient's Primary Care Physician Wound #5 Left Lower Leg o Continue Home Health Visits [name], [name] ([id number]) o Home Health Nurse may visit PRN to address patientos wound care needs. o FACE TO FACE ENCOUNTER: MEDICARE and MEDICAID PATIENTS: I certify that this patient is under my care and that I had a face-to-face encounter that meets the physician face-to-face encounter requirements with this patient  on this date. The encounter with the patient was in whole or in part for the following MEDICAL CONDITION: (primary reason for Home Healthcare) MEDICAL NECESSITY: I certify, that based on my findings, NURSING services are a medically necessary home health service. HOME BOUND STATUS: I certify that my clinical findings support that this patient is homebound (i.e., Due to illness or injury, pt requires aid of supportive devices such as crutches, cane, wheelchairs, walkers, the use of special transportation or the assistance of another person to leave their place of residence. There is a normal inability to leave the home and doing so requires considerable and taxing effort. Other absences are for medical reasons / religious services and are infrequent or of short duration when for other reasons). o If current dressing causes regression in wound condition, may D/C ordered dressing product/s and apply Normal Saline Moist Dressing daily until next Wound Healing Center / Other MD appointment. Notify Wound Healing Center of regression in wound condition at [phone]. o Please direct any NON-WOUND related issues/requests for orders to patient's Primary Care Physician Medications-please add to medication list. Wound #1 Right Elbow o Santyl Enzymatic Ointment Electronic Signature(s) Signed: 12/19/2015 8:18:50 AM By: Evlyn Kanner MD,  FACS Signed: 12/20/2015 9:48:25 AM By: Elliot Gurney RN, BSN, Kim RN, BSN Previous Signature: 12/18/2015 5:05:24 PM Version By: Evlyn Kanner MD, FACS Entered By: Elliot Gurney RN, BSN, Kim on 12/18/2015 17:25:38 [name] ([id number]) -------------------------------------------------------------------------------- Problem List Details Patient Name: [name] Date of Service: 12/18/2015 2:45 PM Medical Record Number: [id number] Patient Account Number: [id number] Date of Birth/Sex: 07/06/1928 (82 y.o. Female) Treating RN: Huel Coventry Primary Care Physician: SYSTEM, PCP Other Clinician: Referring Physician: Treating Physician/Extender: Rudene Re in Treatment: 2 Active Problems ICD-10 Encounter Code Description Active Date Diagnosis S41.101A Unspecified open wound of right upper arm, initial 12/04/2015 Yes encounter L89.629 Pressure ulcer of left heel, unspecified stage 12/04/2015 Yes I50.40 Unspecified combined systolic (congestive) and diastolic 12/04/2015 Yes (congestive) heart failure I89.0 Lymphedema, not elsewhere classified 12/04/2015 Yes Inactive Problems Resolved Problems Electronic Signature(s) Signed: 12/18/2015 4:57:19 PM By: Evlyn Kanner MD, FACS Entered By: Evlyn Kanner on 12/18/2015 16:57:19 [name] ([id number]) -------------------------------------------------------------------------------- Progress Note Details Patient Name: [name] Date of Service: 12/18/2015 2:45 PM Medical Record Number: [id number] Patient Account Number: [id number] Date of Birth/Sex: November 30, 1928 (82 y.o. Female) Treating RN: Huel Coventry Primary Care Physician: SYSTEM, PCP Other Clinician: Referring Physician: Treating Physician/Extender: Rudene Re in Treatment: 2 Subjective Chief Complaint Information obtained from Patient Patient seen for complaints of Non-Healing Wound to the right upper arm laterally near the elbow for about a month. She also has some skin changes on her left  heel. History of Present Illness (HPI) The following HPI elements were documented for the patient's wound: Location: wound on her right arm laterally near the elbow Quality: Patient reports experiencing a dull pain to affected area(s). Severity: Patient states wound are getting worse. Duration: Patient has had the wound for < 4 weeks prior to presenting for treatment Timing: Pain in wound is Intermittent (comes and goes Context: The wound appeared gradually over time Modifying Factors: Other treatment(s) tried include:local care with dressing changes and has recently been on clindamycin Associated Signs and Symptoms: Patient reports having increase swelling all over the body including upper arms and legs. 82 year old patient was been referred to [place] for a ulcerated area on her right elbow. She has a past medical history of CHF, atrial fibrillation, hypertension, gait instability, bilateral lower extremity edema, morbid obesity, altered mental status.  She has never been a smoker. her cardiologist is Dr. Julien Nordmann, who last saw her in March 2017.he has been seeing her with a history of hypertension, gait instability, cognitive impairment, A. fib with RVR and chronic systolic CHF. she is on Eliquis 5 mg twice a day and for the bilateral lower extremity lymphedema he had recommended elevation and compression hose. 12/11/2015 -- x-ray of the right elbow shows no evidence of fracture or bony pathology and the joint spaces are intact. The patient has held her anticoagulation for the last 72 hours. 12/18/2015 -- the patient was seen in the ER for fluid overload and breathlessness and appropriate management was done including increasing her dosage of Lasix after an appropriate workup. Details of this have been noted from the ER record -- chest x-ray did not reveal any acute abnormality and the labs were reviewed. She was put on Lasix 40 mg twice a day for 3 days and she will be on a steroid  taper. She was also advised breathing treatments twice a day and to follow-up with her cardiologist. [name], [name] ([id number]) Objective Constitutional Pulse regular. Respirations normal and unlabored. Afebrile. Vitals Time Taken: 3:41 AM, Height: 64 in, Weight: 153 lbs, BMI: 26.3, Temperature: 97.5 F, Pulse: 76 bpm, Blood Pressure: 111/54 mmHg. Eyes Nonicteric. Reactive to light. Ears, Nose, Mouth, and Throat Lips, teeth, and gums WNL.Marland Kitchen Moist mucosa without lesions. Neck supple and nontender. No palpable supraclavicular or cervical adenopathy. Normal sized without goiter. Respiratory WNL. No retractions.. Cardiovascular Pedal Pulses WNL. pulses are palpable bilaterally. she has got +3 pitting edema both lower extremities and feet. Lymphatic No adneopathy. No adenopathy. No adenopathy. Musculoskeletal Adexa without tenderness or enlargement.. Digits and nails w/o clubbing, cyanosis, infection, petechiae, ischemia, or inflammatory conditions.Marland Kitchen Psychiatric Judgement and insight Intact.. No evidence of depression, anxiety, or agitation.. General Notes: the right arm wound again need sharp debridement with a forcep and scissors and this has been done without much bleeding. She has a couple of new wounds on the right and left lower extremity from abrasions due to her TED hose. the superficial ulceration on her right forearm and hand have improved. The left heel continues to have a dry eschar which is not open at the present time Integumentary (Hair, Skin) No suspicious lesions. No crepitus or fluctuance. No peri-wound warmth or erythema. No masses.. Wound #1 status is Open. Original cause of wound was Gradually Appeared. The wound is located on the Right Elbow. The wound measures 3cm length x 2cm width x 1.4cm depth; 4.712cm^2 area and 6.597cm^3 volume. The wound is limited to skin breakdown. There is a large amount of serosanguineous drainage [name], [name] ([id number]) noted. The wound  margin is flat and intact. There is no granulation within the wound bed. There is a large (67-100%) amount of necrotic tissue within the wound bed including Adherent Slough. The periwound skin appearance exhibited: Localized Edema, Moist, Erythema. The surrounding wound skin color is noted with erythema which is circumferential. Periwound temperature was noted as No Abnormality. The periwound has tenderness on palpation. Wound #2 status is Open. Original cause of wound was Gradually Appeared. The wound is located on the Right Calcaneus. The wound measures 2.3cm length x 3cm width x 0.1cm depth; 5.419cm^2 area and 0.542cm^3 volume. The wound is limited to skin breakdown. There is a none present amount of drainage noted. The wound margin is flat and intact. There is no granulation within the wound bed. There is a large (67-100%) amount of necrotic tissue within  the wound bed including Eschar. The periwound skin appearance exhibited: Dry/Scaly. The periwound skin appearance did not exhibit: Callus, Crepitus, Excoriation, Fluctuance, Friable, Induration, Localized Edema, Rash, Scarring, Maceration, Moist, Atrophie Blanche, Cyanosis, Ecchymosis, Hemosiderin Staining, Mottled, Pallor, Rubor, Erythema. Wound #3 status is Open. Original cause of wound was Trauma. The wound is located on the Right Hand - Dorsum. The wound measures 0.6cm length x 0.5cm width x 0.1cm depth; 0.236cm^2 area and 0.024cm^3 volume. The wound is limited to skin breakdown. There is a none present amount of drainage noted. The wound margin is flat and intact. There is large (67-100%) pink granulation within the wound bed. There is no necrotic tissue within the wound bed. The periwound skin appearance exhibited: Moist. The periwound skin appearance did not exhibit: Callus, Crepitus, Excoriation, Fluctuance, Friable, Induration, Localized Edema, Rash, Scarring, Dry/Scaly, Maceration, Atrophie Blanche, Cyanosis, Ecchymosis, Hemosiderin  Staining, Mottled, Pallor, Rubor, Erythema. Wound #4 status is Open. Original cause of wound was Trauma. The wound is located on the Right Forearm. The wound measures 0.8cm length x 0.5cm width x 0.1cm depth; 0.314cm^2 area and 0.031cm^3 volume. Wound #5 status is Open. Original cause of wound was Gradually Appeared. The wound is located on the Left Lower Leg. The wound measures 3cm length x 1.5cm width x 0.1cm depth; 3.534cm^2 area and 0.353cm^3 volume. The wound is limited to skin breakdown. There is a large amount of serous drainage noted. The wound margin is indistinct and nonvisible. There is medium (34-66%) red granulation within the wound bed. There is a small (1-33%) amount of necrotic tissue within the wound bed including Adherent Slough. The periwound skin appearance did not exhibit: Callus, Crepitus, Excoriation, Fluctuance, Friable, Induration, Localized Edema, Rash, Scarring, Dry/Scaly, Maceration, Moist, Atrophie Blanche, Cyanosis, Ecchymosis, Hemosiderin Staining, Mottled, Pallor, Rubor, Erythema. Other Condition(s) Patient presents with Suspected Deep Tissue Injury located on the Right Leg. The skin appearance exhibited: Erythema. The skin appearance did not exhibit: Atrophie Blanche, Callus, Crepitus, Cyanosis, Dry/Scaly, Ecchymosis, Excoriation, Fluctuance, Friable, Hemosiderin Staining, Induration, Localized Edema, Maceration, Moist, Mottled, Pallor, Rash, Rubor, Scarring. Skin temperature was noted as No Abnormality. 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 Santyl ointment locally to the right elbow area,which should be applied daily. 2. We have discussed offloading  techniques in detail for the lower extremities and at the present time will apply some Betadine paint to the heels. 3. good control of her CHF and lymphedema. Elevation and exercises the only thing we can do at present time and I have told her to stop using the TED hose. 4. Review at the wound center next week. Procedures Wound #1 Wound #1 is an Infection - not elsewhere classified located on the Right Elbow . There was a Skin/Subcutaneous Tissue Debridement ([id number]) debridement with total area of 6 sq cm performed by Evlyn KannerBritto, Milynn Quirion, MD. with the following instrument(s): Forceps and Scissors to remove Viable and Non- Viable tissue/material including Fat, Fibrin/Slough, and Subcutaneous after achieving pain control using Other (lidocaine 4%). A time out was conducted prior to the start of the procedure. A Moderate amount of bleeding was controlled with Pressure. The procedure was tolerated well with a pain level of 0 throughout and a pain level of 0 following the procedure. Post Debridement Measurements: 3cm length x 2cm width  x 1.4cm depth; 6.597cm^3 volume. Post procedure Diagnosis Wound #1: Same as Pre-Procedure Plan Wound Cleansing: Wound #1 Right Elbow: Clean wound with Normal Saline. Wound #2 Right Calcaneus: Clean wound with Normal Saline. Wound #3 Right Hand - Dorsum: Clean wound with Normal Saline. [name], [name] ([id number]) Wound #4 Right Forearm: Clean wound with Normal Saline. Wound #5 Left Lower Leg: Clean wound with Normal Saline. Anesthetic: Wound #1 Right Elbow: Topical Lidocaine 4% cream applied to wound bed prior to debridement Wound #3 Right Hand - Dorsum: Topical Lidocaine 4% cream applied to wound bed prior to debridement Primary Wound Dressing: Wound #1 Right Elbow: Santyl Ointment Wound #2 Right Calcaneus: Other: - betadine paint Wound #3 Right Hand - Dorsum: Boardered Foam Dressing Wound #4 Right Forearm: Boardered Foam Dressing Secondary  Dressing: Wound #1 Right Elbow: Boardered Foam Dressing Wound #2 Right Calcaneus: Boardered Foam Dressing Dressing Change Frequency: Wound #1 Right Elbow: Change dressing every day. Wound #2 Right Calcaneus: Change dressing every day. Wound #3 Right Hand - Dorsum: Change Dressing Monday, Wednesday, Friday Wound #4 Right Forearm: Change Dressing Monday, Wednesday, Friday Wound #5 Left Lower Leg: Change Dressing Monday, Wednesday, Friday Follow-up Appointments: Wound #1 Right Elbow: Return Appointment in 1 week. Wound #2 Right Calcaneus: Return Appointment in 1 week. Wound #3 Right Hand - Dorsum: Return Appointment in 1 week. Wound #4 Right Forearm: Return Appointment in 1 week. Wound #5 Left Lower Leg: Return Appointment in 1 week. Edema Control: Wound #2 Right Calcaneus: Elevate legs to the level of the heart and pump ankles as often as possible - Float heels, elevate legs on a couple pillows when in bed. Sage boots. Additional Orders / Instructions: Wound #1 Right Elbow: [name], [name] ([id number]) Increase protein intake. Wound #2 Right Calcaneus: Increase protein intake. Wound #3 Right Hand - Dorsum: Increase protein intake. Wound #4 Right Forearm: Increase protein intake. Wound #5 Left Lower Leg: Increase protein intake. Home Health: Wound #1 Right Elbow: Continue Home Health Visits Home Health Nurse may visit PRN to address patient s wound care needs. FACE TO FACE ENCOUNTER: MEDICARE and MEDICAID PATIENTS: I certify that this patient is under my care and that I had a face-to-face encounter that meets the physician face-to-face encounter requirements with this patient on this date. The encounter with the patient was in whole or in part for the following MEDICAL CONDITION: (primary reason for Home Healthcare) MEDICAL NECESSITY: I certify, that based on my findings, NURSING services are a medically necessary home health service. HOME BOUND STATUS: I certify that my  clinical findings support that this patient is homebound (i.e., Due to illness or injury, pt requires aid of supportive devices such as crutches, cane, wheelchairs, walkers, the use of special transportation or the assistance of another person to leave their place of residence. There is a normal inability to leave the home and doing so requires considerable and taxing effort. Other absences are for medical reasons / religious services and are infrequent or of short duration when for other reasons). If current dressing causes regression in wound condition, may D/C ordered dressing product/s and apply Normal Saline Moist Dressing daily until next Wound Healing Center / Other MD appointment. Notify Wound Healing Center of regression in wound condition at [phone]. Please direct any NON-WOUND related issues/requests for orders to patient's Primary Care Physician Wound #2 Right Calcaneus: Continue Home Health Visits Home Health Nurse may visit PRN to address patient s wound care needs. FACE TO FACE ENCOUNTER: MEDICARE and MEDICAID PATIENTS: I certify  that this patient is under my care and that I had a face-to-face encounter that meets the physician face-to-face encounter requirements with this patient on this date. The encounter with the patient was in whole or in part for the following MEDICAL CONDITION: (primary reason for Home Healthcare) MEDICAL NECESSITY: I certify, that based on my findings, NURSING services are a medically necessary home health service. HOME BOUND STATUS: I certify that my clinical findings support that this patient is homebound (i.e., Due to illness or injury, pt requires aid of supportive devices such as crutches, cane, wheelchairs, walkers, the use of special transportation or the assistance of another person to leave their place of residence. There is a normal inability to leave the home and doing so requires considerable and taxing effort. Other absences are for  medical reasons / religious services and are infrequent or of short duration when for other reasons). If current dressing causes regression in wound condition, may D/C ordered dressing product/s and apply Normal Saline Moist Dressing daily until next Wound Healing Center / Other MD appointment. Notify Wound Healing Center of regression in wound condition at [phone]. Please direct any NON-WOUND related issues/requests for orders to patient's Primary Care Physician Wound #3 Right Hand - Dorsum: Continue Home Health Visits Home Health Nurse may visit PRN to address patient s wound care needs. FACE TO FACE ENCOUNTER: MEDICARE and MEDICAID PATIENTS: I certify that this patient is under my care and that I had a face-to-face encounter that meets the physician face-to-face encounter requirements with this patient on this date. The encounter with the patient was in whole or in part for the following MEDICAL CONDITION: (primary reason for Home Healthcare) MEDICAL NECESSITY: I certify, [name], [name] ([id number]) that based on my findings, NURSING services are a medically necessary home health service. HOME BOUND STATUS: I certify that my clinical findings support that this patient is homebound (i.e., Due to illness or injury, pt requires aid of supportive devices such as crutches, cane, wheelchairs, walkers, the use of special transportation or the assistance of another person to leave their place of residence. There is a normal inability to leave the home and doing so requires considerable and taxing effort. Other absences are for medical reasons / religious services and are infrequent or of short duration when for other reasons). If current dressing causes regression in wound condition, may D/C ordered dressing product/s and apply Normal Saline Moist Dressing daily until next Wound Healing Center / Other MD appointment. Notify Wound Healing Center of regression in wound condition at  [phone]. Please direct any NON-WOUND related issues/requests for orders to patient's Primary Care Physician Wound #4 Right Forearm: Continue Home Health Visits Home Health Nurse may visit PRN to address patient s wound care needs. FACE TO FACE ENCOUNTER: MEDICARE and MEDICAID PATIENTS: I certify that this patient is under my care and that I had a face-to-face encounter that meets the physician face-to-face encounter requirements with this patient on this date. The encounter with the patient was in whole or in part for the following MEDICAL CONDITION: (primary reason for Home Healthcare) MEDICAL NECESSITY: I certify, that based on my findings, NURSING services are a medically necessary home health service. HOME BOUND STATUS: I certify that my clinical findings support that this patient is homebound (i.e., Due to illness or injury, pt requires aid of supportive devices such as crutches, cane, wheelchairs, walkers, the use of special transportation or the assistance of another person to leave their place of residence. There is a  normal inability to leave the home and doing so requires considerable and taxing effort. Other absences are for medical reasons / religious services and are infrequent or of short duration when for other reasons). If current dressing causes regression in wound condition, may D/C ordered dressing product/s and apply Normal Saline Moist Dressing daily until next Wound Healing Center / Other MD appointment. Notify Wound Healing Center of regression in wound condition at [phone]. Please direct any NON-WOUND related issues/requests for orders to patient's Primary Care Physician Wound #5 Left Lower Leg: Continue Home Health Visits Home Health Nurse may visit PRN to address patient s wound care needs. FACE TO FACE ENCOUNTER: MEDICARE and MEDICAID PATIENTS: I certify that this patient is under my care and that I had a face-to-face encounter that meets the physician  face-to-face encounter requirements with this patient on this date. The encounter with the patient was in whole or in part for the following MEDICAL CONDITION: (primary reason for Home Healthcare) MEDICAL NECESSITY: I certify, that based on my findings, NURSING services are a medically necessary home health service. HOME BOUND STATUS: I certify that my clinical findings support that this patient is homebound (i.e., Due to illness or injury, pt requires aid of supportive devices such as crutches, cane, wheelchairs, walkers, the use of special transportation or the assistance of another person to leave their place of residence. There is a normal inability to leave the home and doing so requires considerable and taxing effort. Other absences are for medical reasons / religious services and are infrequent or of short duration when for other reasons). If current dressing causes regression in wound condition, may D/C ordered dressing product/s and apply Normal Saline Moist Dressing daily until next Wound Healing Center / Other MD appointment. Notify Wound Healing Center of regression in wound condition at [phone]. Please direct any NON-WOUND related issues/requests for orders to patient's Primary Care Physician Medications-please add to medication list.: Wound #1 Right Elbow: Santyl Enzymatic Ointment [name], [name] ([id number]) I have recommended: 1. Santyl ointment locally to the right elbow area,which should be applied daily. 2. We have discussed offloading techniques in detail for the lower extremities and at the present time will apply some Betadine paint to the heels. 3. good control of her CHF and lymphedema. Elevation and exercises the only thing we can do at present time and I have told her to stop using the TED hose. 4. Review at the wound center next week. Electronic Signature(s) Signed: 12/19/2015 8:20:35 AM By: Evlyn Kanner MD, FACS Previous Signature: 12/19/2015 8:20:20 AM Version  By: Evlyn Kanner MD, FACS Previous Signature: 12/18/2015 5:04:31 PM Version By: Evlyn Kanner MD, FACS Entered By: Evlyn Kanner on 12/19/2015 08:20:35 [name] ([id number]) -------------------------------------------------------------------------------- SuperBill Details Patient Name: [name] Date of Service: 12/18/2015 Medical Record Number: [id number] Patient Account Number: [id number] Date of Birth/Sex: 1929/02/18 (82 y.o. Female) Treating RN: Huel Coventry Primary Care Physician: SYSTEM, PCP Other Clinician: Referring Physician: Treating Physician/Extender: Rudene Re in Treatment: 2 Diagnosis Coding ICD-10 Codes Code Description S41.101A Unspecified open wound of right upper arm, initial encounter L89.629 Pressure ulcer of left heel, unspecified stage I50.40 Unspecified combined systolic (congestive) and diastolic (congestive) heart failure I89.0 Lymphedema, not elsewhere classified Facility Procedures CPT4: Description Modifier Quantity Code 40102725 11042 - DEB SUBQ TISSUE 20 SQ CM/< 1 ICD-10 Description Diagnosis S41.101A Unspecified open wound of right upper arm, initial encounter L89.629 Pressure ulcer of left heel, unspecified stage I50.40  Unspecified combined systolic (congestive) and diastolic (congestive) heart failure  I89.0 Lymphedema, not elsewhere classified Physician Procedures CPT4: Description Modifier Quantity Code 1610960 99213 - WC PHYS LEVEL 3 - EST PT 25 1 ICD-10 Description Diagnosis S41.101A Unspecified open wound of right upper arm, initial encounter L89.629 Pressure ulcer of left heel, unspecified stage I50.40  Unspecified combined systolic (congestive) and diastolic (congestive) heart failure I89.0 Lymphedema, not elsewhere classified CPT4: 4540981 11042 - WC PHYS SUBQ TISS 20 SQ CM 1 ICD-10 Description Diagnosis S41.101A Unspecified open wound of right upper arm, initial encounter [name], GALLI ([id number]) Electronic Signature(s) Signed:  12/18/2015 5:04:55 PM By: Evlyn Kanner MD, FACS Entered By: Evlyn Kanner on 12/18/2015 17:04:55

## 2015-12-25 ENCOUNTER — Encounter: Payer: Medicare Other | Admitting: Surgery

## 2015-12-25 DIAGNOSIS — S41101A Unspecified open wound of right upper arm, initial encounter: Secondary | ICD-10-CM | POA: Diagnosis not present

## 2015-12-25 NOTE — Progress Notes (Addendum)
Kirk, Anita ([id number]) Visit Report for 12/25/2015 Debridement Details Patient Name: [name], [name] 12/25/2015 12:45 Date of Service: PM Medical Record [id number] Number: Patient Account Number: [id number] February 26, 1929 (82 y.o. Treating RN: Phillis Haggis Date of Birth/Sex: Female) Other Clinician: Primary Care Physician: SYSTEM, PCP Treating Miho Monda Referring Physician: Physician/Extender: Tania Ade in Treatment: 3 Debridement Performed for Wound #1 Right Elbow Assessment: Performed By: Physician Evlyn Kanner, MD Debridement: Debridement Pre-procedure Yes Verification/Time Out Taken: Start Time: 13:48 Pain Control: Lidocaine 4% Topical Solution Level: Skin/Subcutaneous Tissue Total Area Debrided (L x 3.5 (cm) x 2.7 (cm) = 9.45 (cm) W): Tissue and other Viable, Non-Viable, Exudate, Fibrin/Slough, Subcutaneous material debrided: Instrument: Forceps, Scissors Bleeding: Minimum Hemostasis Achieved: Pressure End Time: 13:59 Procedural Pain: 0 Post Procedural Pain: 0 Response to Treatment: Procedure was tolerated well Post Debridement Measurements of Total Wound Length: (cm) 3.5 Width: (cm) 2.7 Depth: (cm) 0.7 Volume: (cm) 5.195 Post Procedure Diagnosis Same as Pre-procedure Electronic Signature(s) Signed: 12/25/2015 1:56:31 PM By: Evlyn Kanner MD, FACS Signed: 12/25/2015 5:53:23 PM By: Christiana Fuchs, Cambrey ([id number]) Entered By: Evlyn Kanner on 12/25/2015 13:56:31 [name] ([id number]) -------------------------------------------------------------------------------- Debridement Details Patient Name: [name], [name] 12/25/2015 12:45 Date of Service: PM Medical Record [id number] Number: Patient Account Number: [id number] 1928/07/10 (82 y.o. Treating RN: Phillis Haggis Date of Birth/Sex: Female) Other Clinician: Primary Care Physician: SYSTEM, PCP Treating Ravan Schlemmer Referring Physician: Physician/Extender: Tania Ade in Treatment: 3 Debridement  Performed for Wound #2 Left Calcaneus Assessment: Performed By: Physician Evlyn Kanner, MD Debridement: Debridement Pre-procedure Yes Verification/Time Out Taken: Start Time: 13:43 Pain Control: Lidocaine 4% Topical Solution Level: Skin/Subcutaneous Tissue Total Area Debrided (L x 1.8 (cm) x 2.9 (cm) = 5.22 (cm) W): Tissue and other Viable, Non-Viable, Exudate, Fibrin/Slough, Subcutaneous material debrided: Instrument: Forceps, Scissors Bleeding: Minimum Hemostasis Achieved: Pressure End Time: 13:45 Procedural Pain: 0 Post Procedural Pain: 0 Response to Treatment: Procedure was tolerated well Post Debridement Measurements of Total Wound Length: (cm) 1.8 Stage: Unstageable/Unclassified Width: (cm) 2.9 Depth: (cm) 0.2 Volume: (cm) 0.82 Post Procedure Diagnosis Same as Pre-procedure Electronic Signature(s) Signed: 12/25/2015 1:56:44 PM By: Evlyn Kanner MD, FACS Signed: 12/25/2015 5:53:23 PM By: Alejandro Mulling Entered By: Evlyn Kanner on 12/25/2015 13:56:44 Whiteman, Tate ([id number]) -------------------------------------------------------------------------------- Debridement Details Patient Name: Anita, Kirk 12/25/2015 12:45 Date of Service: PM Medical Record [id number] Number: Patient Account Number: [id number] [birth year] (82 y.o. Treating RN: Phillis Haggis Date of Birth/Sex: Female) Other Clinician: Primary Care Physician: SYSTEM, PCP Treating Hadar Elgersma Referring Physician: Physician/Extender: Tania Ade in Treatment: 3 Debridement Performed for Wound #7 Left,Lateral Elbow Assessment: Performed By: Physician Evlyn Kanner, MD Debridement: Debridement Pre-procedure Yes Verification/Time Out Taken: Start Time: 13:46 Pain Control: Lidocaine 4% Topical Solution Level: Skin/Subcutaneous Tissue Total Area Debrided (L x 0.5 (cm) x 0.3 (cm) = 0.15 (cm) W): Tissue and other Viable, Non-Viable, Exudate, Fibrin/Slough, Subcutaneous material  debrided: Instrument: Forceps, Scissors Bleeding: Minimum Hemostasis Achieved: Pressure End Time: 13:47 Procedural Pain: 0 Post Procedural Pain: 0 Response to Treatment: Procedure was tolerated well Post Debridement Measurements of Total Wound Length: (cm) 0.5 Width: (cm) 0.3 Depth: (cm) 0.1 Volume: (cm) 0.012 Post Procedure Diagnosis Same as Pre-procedure Electronic Signature(s) Signed: 12/25/2015 1:56:52 PM By: Evlyn Kanner MD, FACS Signed: 12/25/2015 5:53:23 PM By: Alejandro Mulling Entered By: Evlyn Kanner on 12/25/2015 13:56:52 Derossett, Farron ([id number]) -------------------------------------------------------------------------------- HPI Details Patient Name: [name], [name] 12/25/2015 12:45 Date of Service: PM Medical Record [id number] Number: Patient Account Number: [id number] Apr 29, 1929 (82 y.o. Treating RN: Phillis Haggis Date of Birth/Sex: Female) Other Clinician: Primary Care Physician: SYSTEM,  PCP Treating Hilary Pundt Referring Physician: Physician/Extender: Tania Ade in Treatment: 3 History of Present Illness Location: wound on her right arm laterally near the elbow Quality: Patient reports experiencing a dull pain to affected area(s). Severity: Patient states wound are getting worse. Duration: Patient has had the wound for < 4 weeks prior to presenting for treatment Timing: Pain in wound is Intermittent (comes and goes Context: The wound appeared gradually over time Modifying Factors: Other treatment(s) tried include:local care with dressing changes and has recently been on clindamycin Associated Signs and Symptoms: Patient reports having increase swelling all over the body including upper arms and legs. HPI Description: 82 year old patient was been referred to [place] for a ulcerated area on her right elbow. She has a past medical history of CHF, atrial fibrillation, hypertension, gait instability, bilateral lower extremity edema, morbid obesity, altered mental  status. She has never been a smoker. her cardiologist is Dr. Julien Nordmann, who last saw her in March 2017.he has been seeing her with a history of hypertension, gait instability, cognitive impairment, A. fib with RVR and chronic systolic CHF. she is on Eliquis 5 mg twice a day and for the bilateral lower extremity lymphedema he had recommended elevation and compression hose. 12/11/2015 -- x-ray of the right elbow shows no evidence of fracture or bony pathology and the joint spaces are intact. The patient has held her anticoagulation for the last 72 hours. 12/18/2015 -- the patient was seen in the ER for fluid overload and breathlessness and appropriate management was done including increasing her dosage of Lasix after an appropriate workup. Details of this have been noted from the ER record -- chest x-ray did not reveal any acute abnormality and the labs were reviewed. She was put on Lasix 40 mg twice a day for 3 days and she will be on a steroid taper. She was also advised breathing treatments twice a day and to follow-up with her cardiologist. 12/25/2015 -- she continues to have extensive lower extremity edema and has several new areas on both lower legs plus the left buttock and left elbow. These are all new since last week. Electronic Signature(s) Signed: 12/25/2015 1:57:46 PM By: Evlyn Kanner MD, FACS Entered By: Evlyn Kanner on 12/25/2015 13:57:46 KAROLE, OO ([id number]) -------------------------------------------------------------------------------- Physical Exam Details Patient Name: [name], CUNANAN 12/25/2015 12:45 Date of Service: PM Medical Record [id number] Number: Patient Account Number: [id number] 18-Aug-1928 (82 y.o. Treating RN: Phillis Haggis Date of Birth/Sex: Female) Other Clinician: Primary Care Physician: SYSTEM, PCP Treating Judithann Villamar Referring Physician: Physician/Extender: Tania Ade in Treatment: 3 Constitutional . Pulse regular. Respirations normal and  unlabored. Afebrile. . Eyes Nonicteric. Reactive to light. Ears, Nose, Mouth, and Throat Lips, teeth, and gums WNL.Marland Kitchen Moist mucosa without lesions. Neck supple and nontender. No palpable supraclavicular or cervical adenopathy. Normal sized without goiter. Respiratory WNL. No retractions.. Breath sounds WNL, No rubs, rales, rhonchi, or wheeze.. Cardiovascular Heart rhythm and rate regular, no murmur or gallop.. Pedal Pulses WNL. No clubbing, cyanosis or edema. Chest Breasts symmetical and no nipple discharge.. Breast tissue WNL, no masses, lumps, or tenderness.. Gastrointestinal (GI) Abdomen without masses or tenderness.. No liver or spleen enlargement or tenderness.. Genitourinary (GU) No hydrocele, spermatocele, tenderness of the cord, or testicular mass.Marland Kitchen Penis without lesions.Renetta Chalk without lesions. No cystocele, or rectocele. Pelvic support intact, no discharge.Marland Kitchen Urethra without masses, tenderness or scarring.Marland Kitchen Lymphatic No adneopathy. No adenopathy. No adenopathy. Musculoskeletal Adexa without tenderness or enlargement.. Digits and nails w/o clubbing, cyanosis, infection, petechiae, ischemia, or inflammatory conditions.. Integumentary (Hair, Skin)  No suspicious lesions. No crepitus or fluctuance. No peri-wound warmth or erythema. No masses.Marland Kitchen Psychiatric Judgement and insight Intact.. No evidence of depression, anxiety, or agitation.. Notes with forceps and scissors some of the loose subcutaneous tissue on the right elbow was sharply debrided and had to stop because of pain. She is now got a flap of loose skin on the left elbow which I have sharply Boller, Raniyah ([id number]) debrided with forceps and scissors. The area on her left heel continues to have subcutaneous debris and remove skin and subcutis tissue and some of the underlying fat sharply with forceps and scissors and bleeding controlled with pressure. Electronic Signature(s) Signed: 12/25/2015 1:58:41 PM By: Evlyn Kanner MD, FACS Entered By: Evlyn Kanner on 12/25/2015 13:58:40 [name] ([id number]) -------------------------------------------------------------------------------- Physician Orders Details Patient Name: [name], [name] 12/25/2015 12:45 Date of Service: PM Medical Record [id number] Number: Patient Account Number: [id number] 1929/01/08 (82 y.o. Treating RN: Phillis Haggis Date of Birth/Sex: Female) Other Clinician: Primary Care Physician: SYSTEM, PCP Treating Ivis Nicolson Referring Physician: Physician/Extender: Tania Ade in Treatment: 3 Verbal / Phone Orders: Yes Clinician: Pinkerton, Debi Read Back and Verified: Yes Diagnosis Coding ICD-10 Coding Code Description S41.101A Unspecified open wound of right upper arm, initial encounter L89.629 Pressure ulcer of left heel, unspecified stage I50.40 Unspecified combined systolic (congestive) and diastolic (congestive) heart failure I89.0 Lymphedema, not elsewhere classified Wound Cleansing Wound #1 Right Elbow o Clean wound with Normal Saline. Wound #2 Left Calcaneus o Clean wound with Normal Saline. Wound #4 Right Forearm o Clean wound with Normal Saline. Wound #5 Left Lower Leg o Clean wound with Normal Saline. Wound #6 Left,Medial Metatarsal head first o Clean wound with Normal Saline. Wound #7 Left,Lateral Elbow o Clean wound with Normal Saline. Wound #8 Left,Medial Gluteus o Clean wound with Normal Saline. Wound #9 Right,Posterior Calf o Clean wound with Normal Saline. Anesthetic Wound #1 Right Elbow o Topical Lidocaine 4% cream applied to wound bed prior to debridement - for clinic use Dilks, Cierrah ([id number]) Wound #2 Left Calcaneus o Topical Lidocaine 4% cream applied to wound bed prior to debridement - for clinic use Wound #4 Right Forearm o Topical Lidocaine 4% cream applied to wound bed prior to debridement - for clinic use Wound #5 Left Lower Leg o Topical Lidocaine 4% cream applied to  wound bed prior to debridement - for clinic use Wound #6 Left,Medial Metatarsal head first o Topical Lidocaine 4% cream applied to wound bed prior to debridement - for clinic use Wound #7 Left,Lateral Elbow o Topical Lidocaine 4% cream applied to wound bed prior to debridement - for clinic use Wound #8 Left,Medial Gluteus o Topical Lidocaine 4% cream applied to wound bed prior to debridement - for clinic use Wound #9 Right,Posterior Calf o Topical Lidocaine 4% cream applied to wound bed prior to debridement - for clinic use Skin Barriers/Peri-Wound Care Wound #1 Right Elbow o Skin Prep Wound #2 Left Calcaneus o Skin Prep Wound #4 Right Forearm o Skin Prep Wound #6 Left,Medial Metatarsal head first o Skin Prep Wound #7 Left,Lateral Elbow o Skin Prep Wound #8 Left,Medial Gluteus o Skin Prep Primary Wound Dressing Wound #1 Right Elbow o Santyl Ointment Wound #2 Left Calcaneus o Aquacel Ag Wound #8 Left,Medial Gluteus Caroll, Saba ([id number]) o Aquacel Ag Secondary Dressing Wound #1 Right Elbow o Boardered Foam Dressing Wound #2 Left Calcaneus o Boardered Foam Dressing Wound #4 Right Forearm o Boardered Foam Dressing Wound #6 Left,Medial Metatarsal head first o Boardered Foam Dressing Wound #7 Left,Lateral Elbow   o Boardered Foam Dressing Wound #8 Left,Medial Gluteus o Boardered Foam Dressing Dressing Change Frequency Wound #1 Right Elbow o Change dressing every day. Wound #2 Left Calcaneus o Change dressing every other day. Wound #4 Right Forearm o Change dressing every other day. Wound #6 Left,Medial Metatarsal head first o Change dressing every other day. Wound #7 Left,Lateral Elbow o Change dressing every other day. Wound #8 Left,Medial Gluteus o Change dressing every other day. - unless it falls off or becomes soiled Follow-up Appointments Wound #1 Right Elbow o Return Appointment in 1 week. Wound #2 Left  Calcaneus o Return Appointment in 1 week. Wound #4 Right Forearm Gallardo, Royalty ([id number]) o Return Appointment in 1 week. Wound #5 Left Lower Leg o Return Appointment in 1 week. Wound #6 Left,Medial Metatarsal head first o Return Appointment in 1 week. Wound #7 Left,Lateral Elbow o Return Appointment in 1 week. Wound #8 Left,Medial Gluteus o Return Appointment in 1 week. Wound #9 Right,Posterior Calf o Return Appointment in 1 week. Edema Control Wound #1 Right Elbow o Elevate legs to the level of the heart and pump ankles as often as possible Wound #2 Left Calcaneus o Elevate legs to the level of the heart and pump ankles as often as possible Wound #4 Right Forearm o Elevate legs to the level of the heart and pump ankles as often as possible Wound #5 Left Lower Leg o Elevate legs to the level of the heart and pump ankles as often as possible Wound #6 Left,Medial Metatarsal head first o Elevate legs to the level of the heart and pump ankles as often as possible Wound #7 Left,Lateral Elbow o Elevate legs to the level of the heart and pump ankles as often as possible Wound #8 Left,Medial Gluteus o Elevate legs to the level of the heart and pump ankles as often as possible Wound #9 Right,Posterior Calf o Elevate legs to the level of the heart and pump ankles as often as possible Off-Loading Wound #1 Right Elbow o Turn and reposition every 2 hours Wound #2 Left Calcaneus o Turn and reposition every 2 hours Montero, Kayliah ([id number]) Wound #4 Right Forearm o Turn and reposition every 2 hours Wound #5 Left Lower Leg o Turn and reposition every 2 hours Wound #6 Left,Medial Metatarsal head first o Turn and reposition every 2 hours Wound #7 Left,Lateral Elbow o Turn and reposition every 2 hours Wound #8 Left,Medial Gluteus o Turn and reposition every 2 hours Wound #9 Right,Posterior Calf o Turn and reposition every 2 hours Additional  Orders / Instructions Wound #1 Right Elbow o Increase protein intake. Wound #2 Left Calcaneus o Increase protein intake. Wound #4 Right Forearm o Increase protein intake. Wound #5 Left Lower Leg o Increase protein intake. Wound #6 Left,Medial Metatarsal head first o Increase protein intake. Wound #7 Left,Lateral Elbow o Increase protein intake. Wound #8 Left,Medial Gluteus o Increase protein intake. Wound #9 Right,Posterior Calf o Increase protein intake. Home Health Wound #1 Right Elbow o Continue Home Health Visits o Home Health Nurse may visit PRN to address patientos wound care needs. ONIA, [name] ([id number]) o FACE TO FACE ENCOUNTER: MEDICARE and MEDICAID PATIENTS: I certify that this patient is under my care and that I had a face-to-face encounter that meets the physician face-to-face encounter requirements with this patient on this date. The encounter with the patient was in whole or in part for the following MEDICAL CONDITION: (primary reason for Home Healthcare) MEDICAL NECESSITY: I certify, that based on my findings,  NURSING services are a medically necessary home health service. HOME BOUND STATUS: I certify that my clinical findings support that this patient is homebound (i.e., Due to illness or injury, pt requires aid of supportive devices such as crutches, cane, wheelchairs, walkers, the use of special transportation or the assistance of another person to leave their place of residence. There is a normal inability to leave the home and doing so requires considerable and taxing effort. Other absences are for medical reasons / religious services and are infrequent or of short duration when for other reasons). o If current dressing causes regression in wound condition, may D/C ordered dressing product/s and apply Normal Saline Moist Dressing daily until next Wound Healing Center / Other MD appointment. Notify Wound Healing Center of regression in  wound condition at [phone]. o Please direct any NON-WOUND related issues/requests for orders to patient's Primary Care Physician Wound #2 Left Calcaneus o Continue Home Health Visits o Home Health Nurse may visit PRN to address patientos wound care needs. o FACE TO FACE ENCOUNTER: MEDICARE and MEDICAID PATIENTS: I certify that this patient is under my care and that I had a face-to-face encounter that meets the physician face-to-face encounter requirements with this patient on this date. The encounter with the patient was in whole or in part for the following MEDICAL CONDITION: (primary reason for Home Healthcare) MEDICAL NECESSITY: I certify, that based on my findings, NURSING services are a medically necessary home health service. HOME BOUND STATUS: I certify that my clinical findings support that this patient is homebound (i.e., Due to illness or injury, pt requires aid of supportive devices such as crutches, cane, wheelchairs, walkers, the use of special transportation or the assistance of another person to leave their place of residence. There is a normal inability to leave the home and doing so requires considerable and taxing effort. Other absences are for medical reasons / religious services and are infrequent or of short duration when for other reasons). o If current dressing causes regression in wound condition, may D/C ordered dressing product/s and apply Normal Saline Moist Dressing daily until next Wound Healing Center / Other MD appointment. Notify Wound Healing Center of regression in wound condition at [phone]. o Please direct any NON-WOUND related issues/requests for orders to patient's Primary Care Physician Wound #4 Right Forearm o Continue Home Health Visits o Home Health Nurse may visit PRN to address patientos wound care needs. o FACE TO FACE ENCOUNTER: MEDICARE and MEDICAID PATIENTS: I certify that this patient is under my care and that I  had a face-to-face encounter that meets the physician face-to-face encounter requirements with this patient on this date. The encounter with the patient was in whole or in part for the following MEDICAL CONDITION: (primary reason for Home Healthcare) MEDICAL NECESSITY: I certify, that based on my findings, NURSING services are a medically necessary home health service. HOME BOUND STATUS: I certify that my clinical findings support that this patient is homebound (i.e., Due to illness or injury, pt requires aid of supportive devices such as crutches, cane, wheelchairs, walkers, the use of special Tabora, Oniya ([id number]) transportation or the assistance of another person to leave their place of residence. There is a normal inability to leave the home and doing so requires considerable and taxing effort. Other absences are for medical reasons / religious services and are infrequent or of short duration when for other reasons). o If current dressing causes regression in wound condition, may D/C ordered dressing product/s and apply Normal  Saline Moist Dressing daily until next Wound Healing Center / Other MD appointment. Notify Wound Healing Center of regression in wound condition at [phone]. o Please direct any NON-WOUND related issues/requests for orders to patient's Primary Care Physician Wound #5 Left Lower Leg o Continue Home Health Visits o Home Health Nurse may visit PRN to address patientos wound care needs. o FACE TO FACE ENCOUNTER: MEDICARE and MEDICAID PATIENTS: I certify that this patient is under my care and that I had a face-to-face encounter that meets the physician face-to-face encounter requirements with this patient on this date. The encounter with the patient was in whole or in part for the following MEDICAL CONDITION: (primary reason for Home Healthcare) MEDICAL NECESSITY: I certify, that based on my findings, NURSING services are a medically necessary home  health service. HOME BOUND STATUS: I certify that my clinical findings support that this patient is homebound (i.e., Due to illness or injury, pt requires aid of supportive devices such as crutches, cane, wheelchairs, walkers, the use of special transportation or the assistance of another person to leave their place of residence. There is a normal inability to leave the home and doing so requires considerable and taxing effort. Other absences are for medical reasons / religious services and are infrequent or of short duration when for other reasons). o If current dressing causes regression in wound condition, may D/C ordered dressing product/s and apply Normal Saline Moist Dressing daily until next Wound Healing Center / Other MD appointment. Notify Wound Healing Center of regression in wound condition at [phone]. o Please direct any NON-WOUND related issues/requests for orders to patient's Primary Care Physician Wound #6 Left,Medial Metatarsal head first o Continue Home Health Visits o Home Health Nurse may visit PRN to address patientos wound care needs. o FACE TO FACE ENCOUNTER: MEDICARE and MEDICAID PATIENTS: I certify that this patient is under my care and that I had a face-to-face encounter that meets the physician face-to-face encounter requirements with this patient on this date. The encounter with the patient was in whole or in part for the following MEDICAL CONDITION: (primary reason for Home Healthcare) MEDICAL NECESSITY: I certify, that based on my findings, NURSING services are a medically necessary home health service. HOME BOUND STATUS: I certify that my clinical findings support that this patient is homebound (i.e., Due to illness or injury, pt requires aid of supportive devices such as crutches, cane, wheelchairs, walkers, the use of special transportation or the assistance of another person to leave their place of residence. There is a normal inability to  leave the home and doing so requires considerable and taxing effort. Other absences are for medical reasons / religious services and are infrequent or of short duration when for other reasons). o If current dressing causes regression in wound condition, may D/C ordered dressing product/s and apply Normal Saline Moist Dressing daily until next Wound Healing Center / Other MD appointment. Notify Wound Healing Center of regression in wound condition at [phone]. AARINI, SLEE ([id number]) o Please direct any NON-WOUND related issues/requests for orders to patient's Primary Care Physician Wound #7 Left,Lateral Elbow o Continue Home Health Visits o Home Health Nurse may visit PRN to address patientos wound care needs. o FACE TO FACE ENCOUNTER: MEDICARE and MEDICAID PATIENTS: I certify that this patient is under my care and that I had a face-to-face encounter that meets the physician face-to-face encounter requirements with this patient on this date. The encounter with the patient was in whole or in part for  the following MEDICAL CONDITION: (primary reason for Home Healthcare) MEDICAL NECESSITY: I certify, that based on my findings, NURSING services are a medically necessary home health service. HOME BOUND STATUS: I certify that my clinical findings support that this patient is homebound (i.e., Due to illness or injury, pt requires aid of supportive devices such as crutches, cane, wheelchairs, walkers, the use of special transportation or the assistance of another person to leave their place of residence. There is a normal inability to leave the home and doing so requires considerable and taxing effort. Other absences are for medical reasons / religious services and are infrequent or of short duration when for other reasons). o If current dressing causes regression in wound condition, may D/C ordered dressing product/s and apply Normal Saline Moist Dressing daily until next Wound  Healing Center / Other MD appointment. Notify Wound Healing Center of regression in wound condition at [phone]. o Please direct any NON-WOUND related issues/requests for orders to patient's Primary Care Physician Wound #8 Left,Medial Gluteus o Continue Home Health Visits o Home Health Nurse may visit PRN to address patientos wound care needs. o FACE TO FACE ENCOUNTER: MEDICARE and MEDICAID PATIENTS: I certify that this patient is under my care and that I had a face-to-face encounter that meets the physician face-to-face encounter requirements with this patient on this date. The encounter with the patient was in whole or in part for the following MEDICAL CONDITION: (primary reason for Home Healthcare) MEDICAL NECESSITY: I certify, that based on my findings, NURSING services are a medically necessary home health service. HOME BOUND STATUS: I certify that my clinical findings support that this patient is homebound (i.e., Due to illness or injury, pt requires aid of supportive devices such as crutches, cane, wheelchairs, walkers, the use of special transportation or the assistance of another person to leave their place of residence. There is a normal inability to leave the home and doing so requires considerable and taxing effort. Other absences are for medical reasons / religious services and are infrequent or of short duration when for other reasons). o If current dressing causes regression in wound condition, may D/C ordered dressing product/s and apply Normal Saline Moist Dressing daily until next Wound Healing Center / Other MD appointment. Notify Wound Healing Center of regression in wound condition at [phone]. o Please direct any NON-WOUND related issues/requests for orders to patient's Primary Care Physician Wound #9 Right,Posterior Calf o Continue Home Health Visits o Home Health Nurse may visit PRN to address patientos wound care needs. o FACE TO FACE  ENCOUNTER: MEDICARE and MEDICAID PATIENTS: I certify that this patient is under my care and that I had a face-to-face encounter that meets the physician face-to-face Elray BubaJOBE, Alexah ([id number]) encounter requirements with this patient on this date. The encounter with the patient was in whole or in part for the following MEDICAL CONDITION: (primary reason for Home Healthcare) MEDICAL NECESSITY: I certify, that based on my findings, NURSING services are a medically necessary home health service. HOME BOUND STATUS: I certify that my clinical findings support that this patient is homebound (i.e., Due to illness or injury, pt requires aid of supportive devices such as crutches, cane, wheelchairs, walkers, the use of special transportation or the assistance of another person to leave their place of residence. There is a normal inability to leave the home and doing so requires considerable and taxing effort. Other absences are for medical reasons / religious services and are infrequent or of short duration when for other  reasons). o If current dressing causes regression in wound condition, may D/C ordered dressing product/s and apply Normal Saline Moist Dressing daily until next Wound Healing Center / Other MD appointment. Notify Wound Healing Center of regression in wound condition at [phone]. o Please direct any NON-WOUND related issues/requests for orders to patient's Primary Care Physician Electronic Signature(s) Signed: 12/25/2015 5:53:23 PM By: Alejandro Mulling Signed: 12/26/2015 7:43:18 AM By: Evlyn Kanner MD, FACS Previous Signature: 12/25/2015 4:13:37 PM Version By: Evlyn Kanner MD, FACS Entered By: Alejandro Mulling on 12/25/2015 16:32:46 [name], [name] ([id number]) -------------------------------------------------------------------------------- Problem List Details Patient Name: TAMEAH, MIHALKO 12/25/2015 12:45 Date of Service: PM Medical Record [id number] Number: Patient Account  Number: [id number] [birth year] (82 y.o. Treating RN: Phillis Haggis Date of Birth/Sex: Female) Other Clinician: Primary Care Physician: SYSTEM, PCP Treating Elexia Friedt Referring Physician: Physician/Extender: Tania Ade in Treatment: 3 Active Problems ICD-10 Encounter Code Description Active Date Diagnosis S41.101A Unspecified open wound of right upper arm, initial 12/04/2015 Yes encounter L89.629 Pressure ulcer of left heel, unspecified stage 12/04/2015 Yes I50.40 Unspecified combined systolic (congestive) and diastolic 12/04/2015 Yes (congestive) heart failure I89.0 Lymphedema, not elsewhere classified 12/04/2015 Yes L89.322 Pressure ulcer of left buttock, stage 2 12/25/2015 Yes S41.102A Unspecified open wound of left upper arm, initial 12/25/2015 Yes encounter Inactive Problems Resolved Problems Electronic Signature(s) Signed: 12/25/2015 1:56:11 PM By: Evlyn Kanner MD, FACS Previous Signature: 12/25/2015 1:19:04 PM Version By: Evlyn Kanner MD, FACS Entered By: Evlyn Kanner on 12/25/2015 13:56:11 [name] ([id number]) -------------------------------------------------------------------------------- Progress Note Details Patient Name: SUNDUS, [name] 12/25/2015 12:45 Date of Service: PM Medical Record [id number] Number: Patient Account Number: [id number] Apr 26, 1929 (82 y.o. Treating RN: Phillis Haggis Date of Birth/Sex: Female) Other Clinician: Primary Care Physician: SYSTEM, PCP Treating Amarisa Wilinski Referring Physician: Physician/Extender: Tania Ade in Treatment: 3 Subjective History of Present Illness (HPI) The following HPI elements were documented for the patient's wound: Location: wound on her right arm laterally near the elbow Quality: Patient reports experiencing a dull pain to affected area(s). Severity: Patient states wound are getting worse. Duration: Patient has had the wound for < 4 weeks prior to presenting for treatment Timing: Pain in wound is Intermittent (comes  and goes Context: The wound appeared gradually over time Modifying Factors: Other treatment(s) tried include:local care with dressing changes and has recently been on clindamycin Associated Signs and Symptoms: Patient reports having increase swelling all over the body including upper arms and legs. 82 year old patient was been referred to [place] for a ulcerated area on her right elbow. She has a past medical history of CHF, atrial fibrillation, hypertension, gait instability, bilateral lower extremity edema, morbid obesity, altered mental status. She has never been a smoker. her cardiologist is Dr. Julien Nordmann, who last saw her in March 2017.he has been seeing her with a history of hypertension, gait instability, cognitive impairment, A. fib with RVR and chronic systolic CHF. she is on Eliquis 5 mg twice a day and for the bilateral lower extremity lymphedema he had recommended elevation and compression hose. 12/11/2015 -- x-ray of the right elbow shows no evidence of fracture or bony pathology and the joint spaces are intact. The patient has held her anticoagulation for the last 72 hours. 12/18/2015 -- the patient was seen in the ER for fluid overload and breathlessness and appropriate management was done including increasing her dosage of Lasix after an appropriate workup. Details of this have been noted from the ER record -- chest x-ray did not reveal any acute abnormality and the labs were reviewed. She was  put on Lasix 40 mg twice a day for 3 days and she will be on a steroid taper. She was also advised breathing treatments twice a day and to follow-up with her cardiologist. 12/25/2015 -- she continues to have extensive lower extremity edema and has several new areas on both lower legs plus the left buttock and left elbow. These are all new since last week. Murfin, Jabree ([id number]) Objective Constitutional Pulse regular. Respirations normal and unlabored. Afebrile. Vitals Time Taken:  12:58 PM, Height: 64 in, Weight: 153 lbs, BMI: 26.3, Temperature: 97.8 F, Pulse: 105 bpm, Respiratory Rate: 16 breaths/min, Blood Pressure: 126/77 mmHg. Eyes Nonicteric. Reactive to light. Ears, Nose, Mouth, and Throat Lips, teeth, and gums WNL.Marland Kitchen Moist mucosa without lesions. Neck supple and nontender. No palpable supraclavicular or cervical adenopathy. Normal sized without goiter. Respiratory WNL. No retractions.. Breath sounds WNL, No rubs, rales, rhonchi, or wheeze.. Cardiovascular Heart rhythm and rate regular, no murmur or gallop.. Pedal Pulses WNL. No clubbing, cyanosis or edema. Chest Breasts symmetical and no nipple discharge.. Breast tissue WNL, no masses, lumps, or tenderness.. Gastrointestinal (GI) Abdomen without masses or tenderness.. No liver or spleen enlargement or tenderness.. Genitourinary (GU) No hydrocele, spermatocele, tenderness of the cord, or testicular mass.Marland Kitchen Penis without lesions.Renetta Chalk without lesions. No cystocele, or rectocele. Pelvic support intact, no discharge.Marland Kitchen Urethra without masses, tenderness or scarring.Marland Kitchen Lymphatic No adneopathy. No adenopathy. No adenopathy. Musculoskeletal Adexa without tenderness or enlargement.. Digits and nails w/o clubbing, cyanosis, infection, petechiae, ischemia, or inflammatory conditions.Marland Kitchen Psychiatric Judgement and insight Intact.. No evidence of depression, anxiety, or agitation.. General Notes: with forceps and scissors some of the loose subcutaneous tissue on the right elbow was Hoggard, Yoselyn ([id number]) sharply debrided and had to stop because of pain. She is now got a flap of loose skin on the left elbow which I have sharply debrided with forceps and scissors. The area on her left heel continues to have subcutaneous debris and remove skin and subcutis tissue and some of the underlying fat sharply with forceps and scissors and bleeding controlled with pressure. Integumentary (Hair, Skin) No suspicious lesions.  No crepitus or fluctuance. No peri-wound warmth or erythema. No masses.. Wound #1 status is Open. Original cause of wound was Gradually Appeared. The wound is located on the Right Elbow. The wound measures 3.5cm length x 2.7cm width x 0.7cm depth; 7.422cm^2 area and 5.195cm^3 volume. The wound is limited to skin breakdown. There is undermining starting at 11:00 and ending at 3:00 with a maximum distance of 2cm. There is a large amount of serosanguineous drainage noted. The wound margin is flat and intact. There is no granulation within the wound bed. There is a large (67-100%) amount of necrotic tissue within the wound bed including Adherent Slough. The periwound skin appearance exhibited: Localized Edema, Moist, Erythema. The surrounding wound skin color is noted with erythema which is circumferential. Periwound temperature was noted as No Abnormality. The periwound has tenderness on palpation. Wound #2 status is Open. Original cause of wound was Gradually Appeared. The wound is located on the Left Calcaneus. The wound measures 1.8cm length x 2.9cm width x 0.1cm depth; 4.1cm^2 area and 0.41cm^3 volume. The wound is limited to skin breakdown. There is a small amount of serosanguineous drainage noted. The wound margin is flat and intact. There is no granulation within the wound bed. There is a large (67-100%) amount of necrotic tissue within the wound bed including Eschar. The periwound skin appearance exhibited: Dry/Scaly. The periwound skin appearance did not exhibit:  Callus, Crepitus, Excoriation, Fluctuance, Friable, Induration, Localized Edema, Rash, Scarring, Maceration, Moist, Atrophie Blanche, Cyanosis, Ecchymosis, Hemosiderin Staining, Mottled, Pallor, Rubor, Erythema. Wound #3 status is Healed - Epithelialized. Original cause of wound was Trauma. The wound is located on the Right Hand - Dorsum. The wound measures 0cm length x 0cm width x 0cm depth; 0cm^2 area and 0cm^3 volume. The wound  is limited to skin breakdown. There is a none present amount of drainage noted. The wound margin is flat and intact. There is large (67-100%) pink granulation within the wound bed. There is no necrotic tissue within the wound bed. The periwound skin appearance exhibited: Moist. The periwound skin appearance did not exhibit: Callus, Crepitus, Excoriation, Fluctuance, Friable, Induration, Localized Edema, Rash, Scarring, Dry/Scaly, Maceration, Atrophie Blanche, Cyanosis, Ecchymosis, Hemosiderin Staining, Mottled, Pallor, Rubor, Erythema. Wound #4 status is Open. Original cause of wound was Trauma. The wound is located on the Right Forearm. The wound measures 0.2cm length x 2.5cm width x 0.1cm depth; 0.393cm^2 area and 0.039cm^3 volume. The wound is limited to skin breakdown. There is no tunneling or undermining noted. There is a small amount of serous drainage noted. The wound margin is distinct with the outline attached to the wound base. There is medium (34-66%) red granulation within the wound bed. There is a medium (34-66%) amount of necrotic tissue within the wound bed including Adherent Slough. Wound #5 status is Open. Original cause of wound was Gradually Appeared. The wound is located on the Left Lower Leg. The wound measures 0.7cm length x 3.5cm width x 0.1cm depth; 1.924cm^2 area and 0.192cm^3 volume. The wound is limited to skin breakdown. There is a large amount of serous drainage noted. The wound margin is indistinct and nonvisible. There is medium (34-66%) red granulation within the wound bed. There is a small (1-33%) amount of necrotic tissue within the wound bed including Adherent Slough. The periwound skin appearance did not exhibit: Callus, Crepitus, Excoriation, Fluctuance, Friable, Induration, Localized Edema, Rash, Scarring, Dry/Scaly, Maceration, Moist, Atrophie Blanche, Cyanosis, Ecchymosis, Hemosiderin Staining, Mottled, Pallor, Rubor, Erythema. MARIAGUADALUPE, FIALKOWSKI  ([id number]) Wound #6 status is Open. Original cause of wound was Pressure Injury. The wound is located on the Left,Medial Metatarsal head first. The wound measures 0.8cm length x 1cm width x 0.1cm depth; 0.628cm^2 area and 0.063cm^3 volume. The wound is limited to skin breakdown. There is no tunneling or undermining noted. There is a small amount of serous drainage noted. The wound margin is distinct with the outline attached to the wound base. There is no granulation within the wound bed. There is a large (67- 100%) amount of necrotic tissue within the wound bed including Eschar. The periwound skin appearance exhibited: Dry/Scaly, Erythema. The periwound skin appearance did not exhibit: Localized Edema, Maceration. The surrounding wound skin color is noted with erythema which is circumferential. Wound #7 status is Open. Original cause of wound was Trauma. The wound is located on the Left,Lateral Elbow. The wound measures 0.5cm length x 0.3cm width x 0.1cm depth; 0.118cm^2 area and 0.012cm^3 volume. The wound is limited to skin breakdown. There is no tunneling or undermining noted. There is a large amount of serosanguineous drainage noted. The wound margin is distinct with the outline attached to the wound base. There is no granulation within the wound bed. There is a large (67-100%) amount of necrotic tissue within the wound bed including Adherent Slough. The periwound skin appearance exhibited: Moist, Erythema. The periwound skin appearance did not exhibit: Localized Edema. The surrounding wound skin color is  noted with erythema which is circumferential. Periwound temperature was noted as No Abnormality. The periwound has tenderness on palpation. Wound #8 status is Open. Original cause of wound was Pressure Injury. The wound is located on the Left,Medial Gluteus. The wound measures 2cm length x 0.5cm width x 0.1cm depth; 0.785cm^2 area and 0.079cm^3 volume. The wound is limited to skin  breakdown. There is a medium amount of serosanguineous drainage noted. There is no granulation within the wound bed. There is a large (67-100%) amount of necrotic tissue within the wound bed including Adherent Slough. The periwound skin appearance exhibited: Moist, Erythema. The periwound skin appearance did not exhibit: Localized Edema. The surrounding wound skin color is noted with erythema which is circumferential. Periwound temperature was noted as No Abnormality. Wound #9 status is Open. Original cause of wound was Pressure Injury. The wound is located on the Right,Posterior Calf. The wound measures 0.3cm length x 4.5cm width x 0.1cm depth; 1.06cm^2 area and 0.106cm^3 volume. The wound is limited to skin breakdown. There is a small amount of serous drainage noted. The wound margin is distinct with the outline attached to the wound base. There is large (67-100%) red granulation within the wound bed. There is no necrotic tissue within the wound bed. The periwound skin appearance exhibited: Erythema. The surrounding wound skin color is noted with erythema. Periwound temperature was noted as No Abnormality. Assessment Active Problems ICD-10 S41.101A - Unspecified open wound of right upper arm, initial encounter L89.629 - Pressure ulcer of left heel, unspecified stage I50.40 - Unspecified combined systolic (congestive) and diastolic (congestive) heart failure I89.0 - Lymphedema, not elsewhere classified L89.322 - Pressure ulcer of left buttock, stage 2 Barnhart, Kemaya ([id number]) S41.102A - Unspecified open wound of left upper arm, initial encounter Procedures Wound #1 Wound #1 is an Infection - not elsewhere classified located on the Right Elbow . There was a Skin/Subcutaneous Tissue Debridement ([id number]) debridement with total area of 9.45 sq cm performed by Evlyn Kanner, MD. with the following instrument(s): Forceps and Scissors to remove Viable and Non-Viable tissue/material  including Exudate, Fibrin/Slough, and Subcutaneous after achieving pain control using Lidocaine 4% Topical Solution. A time out was conducted prior to the start of the procedure. A Minimum amount of bleeding was controlled with Pressure. The procedure was tolerated well with a pain level of 0 throughout and a pain level of 0 following the procedure. Post Debridement Measurements: 3.5cm length x 2.7cm width x 0.7cm depth; 5.195cm^3 volume. Post procedure Diagnosis Wound #1: Same as Pre-Procedure Wound #2 Wound #2 is a Pressure Ulcer located on the Left Calcaneus . There was a Skin/Subcutaneous Tissue Debridement ([id number]) debridement with total area of 5.22 sq cm performed by Evlyn Kanner, MD. with the following instrument(s): Forceps and Scissors to remove Viable and Non-Viable tissue/material including Exudate, Fibrin/Slough, and Subcutaneous after achieving pain control using Lidocaine 4% Topical Solution. A time out was conducted prior to the start of the procedure. A Minimum amount of bleeding was controlled with Pressure. The procedure was tolerated well with a pain level of 0 throughout and a pain level of 0 following the procedure. Post Debridement Measurements: 1.8cm length x 2.9cm width x 0.2cm depth; 0.82cm^3 volume. Post debridement Stage noted as Unstageable/Unclassified. Post procedure Diagnosis Wound #2: Same as Pre-Procedure Wound #7 Wound #7 is a Skin Tear located on the Left,Lateral Elbow . There was a Skin/Subcutaneous Tissue Debridement ([id number]) debridement with total area of 0.15 sq cm performed by Evlyn Kanner, MD. with the following instrument(s): Forceps and  Scissors to remove Viable and Non-Viable tissue/material including Exudate, Fibrin/Slough, and Subcutaneous after achieving pain control using Lidocaine 4% Topical Solution. A time out was conducted prior to the start of the procedure. A Minimum amount of bleeding was controlled with Pressure. The procedure  was tolerated well with a pain level of 0 throughout and a pain level of 0 following the procedure. Post Debridement Measurements: 0.5cm length x 0.3cm width x 0.1cm depth; 0.012cm^3 volume. Post procedure Diagnosis Wound #7: Same as Pre-Procedure Plan Lackey, Beatryce ([id number]) Wound Cleansing: Wound #1 Right Elbow: Clean wound with Normal Saline. Wound #2 Left Calcaneus: Clean wound with Normal Saline. Wound #4 Right Forearm: Clean wound with Normal Saline. Wound #5 Left Lower Leg: Clean wound with Normal Saline. Wound #6 Left,Medial Metatarsal head first: Clean wound with Normal Saline. Wound #7 Left,Lateral Elbow: Clean wound with Normal Saline. Wound #8 Left,Medial Gluteus: Clean wound with Normal Saline. Wound #9 Right,Posterior Calf: Clean wound with Normal Saline. Anesthetic: Wound #1 Right Elbow: Topical Lidocaine 4% cream applied to wound bed prior to debridement - for clinic use Wound #2 Left Calcaneus: Topical Lidocaine 4% cream applied to wound bed prior to debridement - for clinic use Wound #4 Right Forearm: Topical Lidocaine 4% cream applied to wound bed prior to debridement - for clinic use Wound #5 Left Lower Leg: Topical Lidocaine 4% cream applied to wound bed prior to debridement - for clinic use Wound #6 Left,Medial Metatarsal head first: Topical Lidocaine 4% cream applied to wound bed prior to debridement - for clinic use Wound #7 Left,Lateral Elbow: Topical Lidocaine 4% cream applied to wound bed prior to debridement - for clinic use Wound #8 Left,Medial Gluteus: Topical Lidocaine 4% cream applied to wound bed prior to debridement - for clinic use Wound #9 Right,Posterior Calf: Topical Lidocaine 4% cream applied to wound bed prior to debridement - for clinic use Skin Barriers/Peri-Wound Care: Wound #1 Right Elbow: Skin Prep Wound #2 Left Calcaneus: Skin Prep Wound #4 Right Forearm: Skin Prep Wound #6 Left,Medial Metatarsal head first: Skin  Prep Wound #7 Left,Lateral Elbow: Skin Prep Wound #8 Left,Medial Gluteus: Skin Prep Primary Wound Dressing: Wound #1 Right Elbow: Santyl Ointment Fabrizio, Sheng ([id number]) Wound #2 Left Calcaneus: Aquacel Ag Wound #8 Left,Medial Gluteus: Aquacel Ag Secondary Dressing: Wound #1 Right Elbow: Boardered Foam Dressing Wound #2 Left Calcaneus: Boardered Foam Dressing Wound #4 Right Forearm: Boardered Foam Dressing Wound #6 Left,Medial Metatarsal head first: Boardered Foam Dressing Wound #7 Left,Lateral Elbow: Boardered Foam Dressing Wound #8 Left,Medial Gluteus: Boardered Foam Dressing Dressing Change Frequency: Wound #1 Right Elbow: Change dressing every day. Wound #2 Left Calcaneus: Change dressing every other day. Wound #4 Right Forearm: Change dressing every other day. Wound #6 Left,Medial Metatarsal head first: Change dressing every other day. Wound #7 Left,Lateral Elbow: Change dressing every other day. Wound #8 Left,Medial Gluteus: Change dressing every other day. - unless it falls off or becomes soiled Follow-up Appointments: Wound #1 Right Elbow: Return Appointment in 1 week. Wound #2 Left Calcaneus: Return Appointment in 1 week. Wound #4 Right Forearm: Return Appointment in 1 week. Wound #5 Left Lower Leg: Return Appointment in 1 week. Wound #6 Left,Medial Metatarsal head first: Return Appointment in 1 week. Wound #7 Left,Lateral Elbow: Return Appointment in 1 week. Wound #8 Left,Medial Gluteus: Return Appointment in 1 week. Wound #9 Right,Posterior Calf: Return Appointment in 1 week. Edema Control: Wound #1 Right Elbow: Elevate legs to the level of the heart and pump ankles as often as possible Wound #2  Left Calcaneus: Haro, Natividad ([id number]) Elevate legs to the level of the heart and pump ankles as often as possible Wound #4 Right Forearm: Elevate legs to the level of the heart and pump ankles as often as possible Wound #5 Left Lower Leg: Elevate  legs to the level of the heart and pump ankles as often as possible Wound #6 Left,Medial Metatarsal head first: Elevate legs to the level of the heart and pump ankles as often as possible Wound #7 Left,Lateral Elbow: Elevate legs to the level of the heart and pump ankles as often as possible Wound #8 Left,Medial Gluteus: Elevate legs to the level of the heart and pump ankles as often as possible Wound #9 Right,Posterior Calf: Elevate legs to the level of the heart and pump ankles as often as possible Off-Loading: Wound #1 Right Elbow: Turn and reposition every 2 hours Wound #2 Left Calcaneus: Turn and reposition every 2 hours Wound #4 Right Forearm: Turn and reposition every 2 hours Wound #5 Left Lower Leg: Turn and reposition every 2 hours Wound #6 Left,Medial Metatarsal head first: Turn and reposition every 2 hours Wound #7 Left,Lateral Elbow: Turn and reposition every 2 hours Wound #8 Left,Medial Gluteus: Turn and reposition every 2 hours Wound #9 Right,Posterior Calf: Turn and reposition every 2 hours Additional Orders / Instructions: Wound #1 Right Elbow: Increase protein intake. Wound #2 Left Calcaneus: Increase protein intake. Wound #4 Right Forearm: Increase protein intake. Wound #5 Left Lower Leg: Increase protein intake. Wound #6 Left,Medial Metatarsal head first: Increase protein intake. Wound #7 Left,Lateral Elbow: Increase protein intake. Wound #8 Left,Medial Gluteus: Increase protein intake. Wound #9 Right,Posterior Calf: Increase protein intake. Home Health: Wound #1 Right Elbow: Continue Home Health Visits Home Health Nurse may visit PRN to address patient s wound care needs. [name], [name] ([id number]) FACE TO FACE ENCOUNTER: MEDICARE and MEDICAID PATIENTS: I certify that this patient is under my care and that I had a face-to-face encounter that meets the physician face-to-face encounter requirements with this patient on this date. The encounter with  the patient was in whole or in part for the following MEDICAL CONDITION: (primary reason for Home Healthcare) MEDICAL NECESSITY: I certify, that based on my findings, NURSING services are a medically necessary home health service. HOME BOUND STATUS: I certify that my clinical findings support that this patient is homebound (i.e., Due to illness or injury, pt requires aid of supportive devices such as crutches, cane, wheelchairs, walkers, the use of special transportation or the assistance of another person to leave their place of residence. There is a normal inability to leave the home and doing so requires considerable and taxing effort. Other absences are for medical reasons / religious services and are infrequent or of short duration when for other reasons). If current dressing causes regression in wound condition, may D/C ordered dressing product/s and apply Normal Saline Moist Dressing daily until next Wound Healing Center / Other MD appointment. Notify Wound Healing Center of regression in wound condition at [phone]. Please direct any NON-WOUND related issues/requests for orders to patient's Primary Care Physician Wound #2 Left Calcaneus: Continue Home Health Visits Home Health Nurse may visit PRN to address patient s wound care needs. FACE TO FACE ENCOUNTER: MEDICARE and MEDICAID PATIENTS: I certify that this patient is under my care and that I had a face-to-face encounter that meets the physician face-to-face encounter requirements with this patient on this date. The encounter with the patient was in whole or in part for the following  MEDICAL CONDITION: (primary reason for Home Healthcare) MEDICAL NECESSITY: I certify, that based on my findings, NURSING services are a medically necessary home health service. HOME BOUND STATUS: I certify that my clinical findings support that this patient is homebound (i.e., Due to illness or injury, pt requires aid of supportive devices such as  crutches, cane, wheelchairs, walkers, the use of special transportation or the assistance of another person to leave their place of residence. There is a normal inability to leave the home and doing so requires considerable and taxing effort. Other absences are for medical reasons / religious services and are infrequent or of short duration when for other reasons). If current dressing causes regression in wound condition, may D/C ordered dressing product/s and apply Normal Saline Moist Dressing daily until next Wound Healing Center / Other MD appointment. Notify Wound Healing Center of regression in wound condition at [phone]. Please direct any NON-WOUND related issues/requests for orders to patient's Primary Care Physician Wound #4 Right Forearm: Continue Home Health Visits Home Health Nurse may visit PRN to address patient s wound care needs. FACE TO FACE ENCOUNTER: MEDICARE and MEDICAID PATIENTS: I certify that this patient is under my care and that I had a face-to-face encounter that meets the physician face-to-face encounter requirements with this patient on this date. The encounter with the patient was in whole or in part for the following MEDICAL CONDITION: (primary reason for Home Healthcare) MEDICAL NECESSITY: I certify, that based on my findings, NURSING services are a medically necessary home health service. HOME BOUND STATUS: I certify that my clinical findings support that this patient is homebound (i.e., Due to illness or injury, pt requires aid of supportive devices such as crutches, cane, wheelchairs, walkers, the use of special transportation or the assistance of another person to leave their place of residence. There is a normal inability to leave the home and doing so requires considerable and taxing effort. Other absences are for medical reasons / religious services and are infrequent or of short duration when for other reasons). If current dressing causes regression in  wound condition, may D/C ordered dressing product/s and apply Normal Saline Moist Dressing daily until next Wound Healing Center / Other MD appointment. Notify Wound Healing Center of regression in wound condition at [phone]. Please direct any NON-WOUND related issues/requests for orders to patient's Primary Care Physician Wound #5 Left Lower Leg: Continue Home Health Visits Home Health Nurse may visit PRN to address patient s wound care needs. [name], [name] ([id number]) FACE TO FACE ENCOUNTER: MEDICARE and MEDICAID PATIENTS: I certify that this patient is under my care and that I had a face-to-face encounter that meets the physician face-to-face encounter requirements with this patient on this date. The encounter with the patient was in whole or in part for the following MEDICAL CONDITION: (primary reason for Home Healthcare) MEDICAL NECESSITY: I certify, that based on my findings, NURSING services are a medically necessary home health service. HOME BOUND STATUS: I certify that my clinical findings support that this patient is homebound (i.e., Due to illness or injury, pt requires aid of supportive devices such as crutches, cane, wheelchairs, walkers, the use of special transportation or the assistance of another person to leave their place of residence. There is a normal inability to leave the home and doing so requires considerable and taxing effort. Other absences are for medical reasons / religious services and are infrequent or of short duration when for other reasons). If current dressing causes regression in wound condition,  may D/C ordered dressing product/s and apply Normal Saline Moist Dressing daily until next Wound Healing Center / Other MD appointment. Notify Wound Healing Center of regression in wound condition at [phone]. Please direct any NON-WOUND related issues/requests for orders to patient's Primary Care Physician Wound #6 Left,Medial Metatarsal head  first: Continue Home Health Visits Home Health Nurse may visit PRN to address patient s wound care needs. FACE TO FACE ENCOUNTER: MEDICARE and MEDICAID PATIENTS: I certify that this patient is under my care and that I had a face-to-face encounter that meets the physician face-to-face encounter requirements with this patient on this date. The encounter with the patient was in whole or in part for the following MEDICAL CONDITION: (primary reason for Home Healthcare) MEDICAL NECESSITY: I certify, that based on my findings, NURSING services are a medically necessary home health service. HOME BOUND STATUS: I certify that my clinical findings support that this patient is homebound (i.e., Due to illness or injury, pt requires aid of supportive devices such as crutches, cane, wheelchairs, walkers, the use of special transportation or the assistance of another person to leave their place of residence. There is a normal inability to leave the home and doing so requires considerable and taxing effort. Other absences are for medical reasons / religious services and are infrequent or of short duration when for other reasons). If current dressing causes regression in wound condition, may D/C ordered dressing product/s and apply Normal Saline Moist Dressing daily until next Wound Healing Center / Other MD appointment. Notify Wound Healing Center of regression in wound condition at [phone]. Please direct any NON-WOUND related issues/requests for orders to patient's Primary Care Physician Wound #7 Left,Lateral Elbow: Continue Home Health Visits Home Health Nurse may visit PRN to address patient s wound care needs. FACE TO FACE ENCOUNTER: MEDICARE and MEDICAID PATIENTS: I certify that this patient is under my care and that I had a face-to-face encounter that meets the physician face-to-face encounter requirements with this patient on this date. The encounter with the patient was in whole or in part for  the following MEDICAL CONDITION: (primary reason for Home Healthcare) MEDICAL NECESSITY: I certify, that based on my findings, NURSING services are a medically necessary home health service. HOME BOUND STATUS: I certify that my clinical findings support that this patient is homebound (i.e., Due to illness or injury, pt requires aid of supportive devices such as crutches, cane, wheelchairs, walkers, the use of special transportation or the assistance of another person to leave their place of residence. There is a normal inability to leave the home and doing so requires considerable and taxing effort. Other absences are for medical reasons / religious services and are infrequent or of short duration when for other reasons). If current dressing causes regression in wound condition, may D/C ordered dressing product/s and apply Normal Saline Moist Dressing daily until next Wound Healing Center / Other MD appointment. Notify Wound Healing Center of regression in wound condition at [phone]. Please direct any NON-WOUND related issues/requests for orders to patient's Primary Care Physician Wound #8 Left,Medial Gluteus: Continue Home Health Visits Home Health Nurse may visit PRN to address patient s wound care needs. [name], [name] ([id number]) FACE TO FACE ENCOUNTER: MEDICARE and MEDICAID PATIENTS: I certify that this patient is under my care and that I had a face-to-face encounter that meets the physician face-to-face encounter requirements with this patient on this date. The encounter with the patient was in whole or in part for the following MEDICAL CONDITION: (  primary reason for Home Healthcare) MEDICAL NECESSITY: I certify, that based on my findings, NURSING services are a medically necessary home health service. HOME BOUND STATUS: I certify that my clinical findings support that this patient is homebound (i.e., Due to illness or injury, pt requires aid of supportive devices such as crutches,  cane, wheelchairs, walkers, the use of special transportation or the assistance of another person to leave their place of residence. There is a normal inability to leave the home and doing so requires considerable and taxing effort. Other absences are for medical reasons / religious services and are infrequent or of short duration when for other reasons). If current dressing causes regression in wound condition, may D/C ordered dressing product/s and apply Normal Saline Moist Dressing daily until next Wound Healing Center / Other MD appointment. Notify Wound Healing Center of regression in wound condition at [phone]. Please direct any NON-WOUND related issues/requests for orders to patient's Primary Care Physician Wound #9 Right,Posterior Calf: Continue Home Health Visits Home Health Nurse may visit PRN to address patient s wound care needs. FACE TO FACE ENCOUNTER: MEDICARE and MEDICAID PATIENTS: I certify that this patient is under my care and that I had a face-to-face encounter that meets the physician face-to-face encounter requirements with this patient on this date. The encounter with the patient was in whole or in part for the following MEDICAL CONDITION: (primary reason for Home Healthcare) MEDICAL NECESSITY: I certify, that based on my findings, NURSING services are a medically necessary home health service. HOME BOUND STATUS: I certify that my clinical findings support that this patient is homebound (i.e., Due to illness or injury, pt requires aid of supportive devices such as crutches, cane, wheelchairs, walkers, the use of special transportation or the assistance of another person to leave their place of residence. There is a normal inability to leave the home and doing so requires considerable and taxing effort. Other absences are for medical reasons / religious services and are infrequent or of short duration when for other reasons). If current dressing causes regression in  wound condition, may D/C ordered dressing product/s and apply Normal Saline Moist Dressing daily until next Wound Healing Center / Other MD appointment. Notify Wound Healing Center of regression in wound condition at [phone]. Please direct any NON-WOUND related issues/requests for orders to patient's Primary Care Physician I have recommended: 1. Santyl ointment locally to the right elbow area,which should be applied daily. 2. to the left elbow we will apply a piece of border foam. 3. We have discussed offloading techniques in detail for the lower extremities and at the present time will apply some Aquacel Ag to the left heels. 4. good control of her CHF and lymphedema. Elevation and exercises the only thing we can do at present time and I have told her to stop using the TED hose. 5. Review at the wound center next week. Electronic Signature(s) Signed: 12/26/2015 7:44:14 AM By: Evlyn Kanner MD, FACS Previous Signature: 12/25/2015 4:20:04 PM Version By: Evlyn Kanner MD, FACS [place], Candus ([id number]) Previous Signature: 12/25/2015 1:59:53 PM Version By: Evlyn Kanner MD, FACS Entered By: Evlyn Kanner on 12/26/2015 07:44:14 [name] ([id number]) -------------------------------------------------------------------------------- SuperBill Details Patient Name: [name] Date of Service: 12/25/2015 Medical Record Number: [id number] Patient Account Number: [id number] Date of Birth/Sex: January 12, 1929 (82 y.o. Female) Treating RN: Ashok Cordia, Debi Primary Care Physician: SYSTEM, PCP Other Clinician: Referring Physician: Treating Physician/Extender: Rudene Re in Treatment: 3 Diagnosis Coding ICD-10 Codes Code Description S41.101A Unspecified open wound of  right upper arm, initial encounter L89.629 Pressure ulcer of left heel, unspecified stage I50.40 Unspecified combined systolic (congestive) and diastolic (congestive) heart failure I89.0 Lymphedema, not elsewhere  classified L89.322 Pressure ulcer of left buttock, stage 2 S41.102A Unspecified open wound of left upper arm, initial encounter Facility Procedures CPT4: Description Modifier Quantity Code [id number] 11042 - DEB SUBQ TISSUE 20 SQ CM/< 1 ICD-10 Description Diagnosis S41.101A Unspecified open wound of right upper arm, initial encounter L89.629 Pressure ulcer of left heel, unspecified stage I50.40  Unspecified combined systolic (congestive) and diastolic (congestive) heart failure S41.102A Unspecified open wound of left upper arm, initial encounter Physician Procedures CPT4: Description Modifier Quantity Code [id number] 99213 - WC PHYS LEVEL 3 - EST PT 25 1 ICD-10 Description Diagnosis S41.101A Unspecified open wound of right upper arm, initial encounter S41.102A Unspecified open wound of left upper arm, initial encounter  L89.629 Pressure ulcer of left heel, unspecified stage CPT4: [id number] 11042 - WC PHYS SUBQ TISS 20 SQ CM 1 ICD-10 Description Diagnosis Froio, Saachi ([id number]) Electronic Signature(s) Signed: 12/25/2015 2:00:17 PM By: Evlyn Kanner MD, FACS Entered By: Evlyn Kanner on 12/25/2015 14:00:17

## 2015-12-25 NOTE — Progress Notes (Addendum)
[name], Jaymi ([id number]) Visit Report for 12/25/2015 Arrival Information Details Patient Name: [name], [name] Date of Service: 12/25/2015 12:45 PM Medical Record Number: [id number] Patient Account Number: [id number] Date of Birth/Sex: 1929/01/01 (82 y.o. Female) Treating RN: Ashok CordiaPinkerton, Debi Primary Care Physician: SYSTEM, PCP Other Clinician: Referring Physician: Treating Physician/Extender: Rudene ReBritto, Errol Weeks in Treatment: 3 Visit Information History Since Last Visit All ordered tests and consults were completed: No Patient Arrived: Wheel Chair Added or deleted any medications: No Arrival Time: 12:57 Any new allergies or adverse reactions: No Accompanied By: sister Had a fall or experienced change in No Transfer Assistance: EasyPivot Patient activities of daily living that may affect Lift risk of falls: Patient Identification Verified: Yes Signs or symptoms of abuse/neglect since last No Secondary Verification Process Yes visito Completed: Hospitalized since last visit: No Patient Requires Transmission- No Pain Present Now: No Based Precautions: Patient Has Alerts: Yes Patient Alerts: Patient on Blood Thinner ABI: (L) 1.56 Eliquis Electronic Signature(s) Signed: 12/25/2015 5:53:23 PM By: Alejandro MullingPinkerton, Debra Entered By: Alejandro MullingPinkerton, Debra on 12/25/2015 12:57:44 [name], [name] ([id number]) -------------------------------------------------------------------------------- Encounter Discharge Information Details Patient Name: [name], [name] Date of Service: 12/25/2015 12:45 PM Medical Record Number: [id number] Patient Account Number: [id number] Date of Birth/Sex: 1929/01/01 (82 y.o. Female) Treating RN: Ashok CordiaPinkerton, Debi Primary Care Physician: SYSTEM, PCP Other Clinician: Referring Physician: Treating Physician/Extender: Rudene ReBritto, Errol Weeks in Treatment: 3 Encounter Discharge Information Items Discharge Pain Level: 0 Discharge Condition: Stable Ambulatory Status:  Wheelchair Discharge Destination: Nursing Home Transportation: Private Auto Accompanied By: sister Schedule Follow-up Appointment: Yes Medication Reconciliation completed and provided to Patient/Care Yes Neftaly Swiss: Provided on Clinical Summary of Care: 12/25/2015 Form Type Recipient Paper Patient MJ Electronic Signature(s) Signed: 12/25/2015 2:20:11 PM By: Gwenlyn PerkingMoore, Shelia Entered By: Gwenlyn PerkingMoore, Shelia on 12/25/2015 14:20:11 [name], [name] ([id number]) -------------------------------------------------------------------------------- Lower Extremity Assessment Details Patient Name: [name], [name] Date of Service: 12/25/2015 12:45 PM Medical Record Number: [id number] Patient Account Number: [id number] Date of Birth/Sex: 1929/01/01 (82 y.o. Female) Treating RN: Ashok CordiaPinkerton, Debi Primary Care Physician: SYSTEM, PCP Other Clinician: Referring Physician: Treating Physician/Extender: Rudene ReBritto, Errol Weeks in Treatment: 3 Edema Assessment Assessed: [Left: No] [Right: No] E[Left: dema] [Right: :] Calf Left: Right: Point of Measurement: 28 cm From Medial Instep 39.8 cm 39 cm Ankle Left: Right: Point of Measurement: 10 cm From Medial Instep 27 cm 27.4 cm Vascular Assessment Pulses: Posterior Tibial Dorsalis Pedis Palpable: [Left:No] [Right:No] Doppler: [Left:Monophasic] [Right:Monophasic] Extremity colors, hair growth, and conditions: Temperature of Extremity: [Left:Warm] [Right:Warm] Capillary Refill: [Left:< 3 seconds] [Right:< 3 seconds] Electronic Signature(s) Signed: 12/25/2015 5:53:23 PM By: Alejandro MullingPinkerton, Debra Entered By: Alejandro MullingPinkerton, Debra on 12/25/2015 13:01:26 [name], [name] ([id number]) -------------------------------------------------------------------------------- Multi Wound Chart Details Patient Name: [name], [name] Date of Service: 12/25/2015 12:45 PM Medical Record Number: [id number] Patient Account Number: [id number] Date of Birth/Sex: 1929/01/01 (82 y.o. Female) Treating RN: Ashok CordiaPinkerton,  Debi Primary Care Physician: SYSTEM, PCP Other Clinician: Referring Physician: Treating Physician/Extender: Rudene ReBritto, Errol Weeks in Treatment: 3 Vital Signs Height(in): 64 Pulse(bpm): 105 Weight(lbs): 153 Blood Pressure 126/77 (mmHg): Body Mass Index(BMI): 26 Temperature(F): 97.8 Respiratory Rate 16 (breaths/min): Photos: [1:No Photos] [2:No Photos] [3:No Photos] Wound Location: [1:Right Elbow] [2:Left Calcaneus] [3:Right Hand - Dorsum] Wounding Event: [1:Gradually Appeared] [2:Gradually Appeared] [3:Trauma] Primary Etiology: [1:Infection - not elsewhere classified] [2:Pressure Ulcer] [3:Trauma, Other] Comorbid History: [1:Cataracts, Lymphedema, Aspiration, Chronic Obstructive Pulmonary Disease (COPD), Arrhythmia, Congestive Heart Failure, Hypertension, Peripheral Venous Disease, Osteoarthritis, Dementia] [2:Cataracts, Lymphedema, Aspiration, Chronic  Obstructive Pulmonary Disease (COPD), Arrhythmia, Congestive Heart Failure, Hypertension, Peripheral Venous Disease, Osteoarthritis, Dementia] [3:Cataracts, Lymphedema, Aspiration, Chronic Obstructive Pulmonary  Disease (COPD), Arrhythmia, Congestive  Heart Failure, Hypertension, Peripheral Venous Disease, Osteoarthritis, Dementia] Date Acquired: [1:11/14/2015] [2:10/30/2015] [3:12/07/2015] Weeks of Treatment: [1:3] [2:2] [3:2] Wound Status: [1:Open] [2:Open] [3:Healed - Epithelialized] Measurements L x W x D 3.5x2.7x0.7 [2:1.8x2.9x0.1] [3:0x0x0] (cm) Area (cm) : [1:7.422] [2:4.1] [3:0] Volume (cm) : [1:5.195] [2:0.41] [3:0] % Reduction in Area: [1:32.50%] [2:13.00%] [3:100.00%] % Reduction in Volume: 72.20% [2:13.00%] [3:100.00%] Starting Position 1 11 (o'clock): Ending Position 1 [1:3] (o'clock): Maximum Distance 1 2 (cm): Undermining: [1:Yes] [2:N/A] [3:N/A] Classification: Full Thickness Without Unstageable/Unclassified Partial Thickness Exposed Support Structures Exudate Amount: Large Small None Present Exudate Type:  Serosanguineous Serosanguineous N/A Exudate Color: red, brown red, brown N/A Foul Odor After Yes No N/A Cleansing: Odor Anticipated Due to No N/A N/A Product Use: Wound Margin: Flat and Intact Flat and Intact Flat and Intact Granulation Amount: None Present (0%) None Present (0%) Large (67-100%) Granulation Quality: N/A N/A Pink Necrotic Amount: Large (67-100%) Large (67-100%) None Present (0%) Necrotic Tissue: Adherent Slough Eschar N/A Exposed Structures: Fascia: No Fascia: No Fascia: No Fat: No Fat: No Fat: No Tendon: No Tendon: No Tendon: No Muscle: No Muscle: No Muscle: No Joint: No Joint: No Joint: No Bone: No Bone: No Bone: No Limited to Skin Limited to Skin Limited to Skin Breakdown Breakdown Breakdown Epithelialization: None N/A Large (67-100%) Debridement: Debridement ([id number] Debridement ([id number]- N/A 11047) [id number]) Time-Out Taken: Yes Yes N/A Pain Control: Lidocaine 4% Topical Lidocaine 4% Topical N/A Solution Solution Tissue Debrided: Fibrin/Slough, Exudates, Fibrin/Slough, Exudates, N/A Subcutaneous Subcutaneous Level: Skin/Subcutaneous Skin/Subcutaneous N/A Tissue Tissue Debridement Area (sq 9.45 5.22 N/A cm): Instrument: Forceps, Scissors Forceps, Scissors N/A Bleeding: Minimum Minimum N/A Hemostasis Achieved: Pressure Pressure N/A Procedural Pain: 0 0 N/A Post Procedural Pain: 0 0 N/A Debridement Treatment Procedure was tolerated Procedure was tolerated N/A Response: well well Post Debridement 3.5x2.7x0.7 1.8x2.9x0.2 N/A Measurements L x W x D (cm) Post Debridement 5.195 0.82 N/A Volume: (cm) Post Debridement N/A Unstageable/Unclassified N/A Stage: Kirk, [name] ([id number]) Periwound Skin Texture: Edema: Yes Edema: No Edema: No Excoriation: No Excoriation: No Induration: No Induration: No Callus: No Callus: No Crepitus: No Crepitus: No Fluctuance: No Fluctuance: No Friable: No Friable: No Rash: No Rash: No Scarring: No Scarring:  No Periwound Skin Moist: Yes Dry/Scaly: Yes Moist: Yes Moisture: Maceration: No Maceration: No Moist: No Dry/Scaly: No Periwound Skin Color: Erythema: Yes Atrophie Blanche: No Atrophie Blanche: No Cyanosis: No Cyanosis: No Ecchymosis: No Ecchymosis: No Erythema: No Erythema: No Hemosiderin Staining: No Hemosiderin Staining: No Mottled: No Mottled: No Pallor: No Pallor: No Rubor: No Rubor: No Erythema Location: Circumferential N/A N/A Temperature: No Abnormality N/A N/A Tenderness on Yes No No Palpation: Wound Preparation: Ulcer Cleansing: Ulcer Cleansing: Not Ulcer Cleansing: Rinsed/Irrigated with Cleansed Rinsed/Irrigated with Saline Saline Topical Anesthetic Topical Anesthetic Applied: None Topical Anesthetic Applied: Other: lidocaine Applied: None 4% Procedures Performed: N/A Debridement N/A Wound Number: [id number]  Photos: No Photos No Photos No Photos Wound Location: Right Forearm Left Lower Leg Left Metatarsal head first - Medial Wounding Event: Trauma Gradually Appeared Pressure Injury Primary Etiology: Trauma, Other Pressure Ulcer Pressure Ulcer Comorbid History: Cataracts, Lymphedema, Cataracts, Lymphedema, Cataracts, Lymphedema, Aspiration, Chronic Aspiration, Chronic Aspiration, Chronic Obstructive Pulmonary Obstructive Pulmonary Obstructive Pulmonary Disease (COPD), Disease (COPD), Disease (COPD), Arrhythmia, Congestive Arrhythmia, Congestive Arrhythmia, Congestive Heart Failure, Heart Failure, Heart Failure, Hypertension, Peripheral Hypertension, Peripheral Hypertension, Peripheral Venous Disease, Venous Disease, Venous Disease, Osteoarthritis, Dementia Osteoarthritis, Dementia Osteoarthritis, Dementia Date Acquired: 12/07/2015 12/13/2015 12/25/2015 Weeks of Treatment: 2 1 0 Resnik, Kindle ([id number])  Wound Status: Open Open Open Measurements L x W x D 0.2x2.5x0.1 0.7x3.5x0.1 0.8x1x0.1 (cm) Area (cm) : 0.393 1.924 0.628 Volume (cm) : 0.039 0.192  0.063 % Reduction in Area: 49.90% 45.60% N/A % Reduction in Volume: 50.60% 45.60% N/A Undermining: No N/A No Classification: Partial Thickness Category/Stage II Category/Stage II Exudate Amount: Small Large Small Exudate Type: Serous Serous Serous Exudate Color: amber amber amber Foul Odor After No No No Cleansing: Odor Anticipated Due to N/A N/A N/A Product Use: Wound Margin: Distinct, outline attached Indistinct, nonvisible Distinct, outline attached Granulation Amount: Medium (34-66%) Medium (34-66%) None Present (0%) Granulation Quality: Red Red N/A Necrotic Amount: Medium (34-66%) Small (1-33%) Large (67-100%) Necrotic Tissue: Adherent Slough Adherent Slough Eschar Exposed Structures: Fascia: No Fascia: No Fascia: No Fat: No Fat: No Fat: No Tendon: No Tendon: No Tendon: No Muscle: No Muscle: No Muscle: No Joint: No Joint: No Joint: No Bone: No Bone: No Bone: No Limited to Skin Limited to Skin Limited to Skin Breakdown Breakdown Breakdown Epithelialization: None N/A None Debridement: N/A N/A N/A Time-Out Taken: N/A N/A N/A Pain Control: N/A N/A N/A Tissue Debrided: N/A N/A N/A Level: N/A N/A N/A Debridement Area (sq N/A N/A N/A cm): Instrument: N/A N/A N/A Bleeding: N/A N/A N/A Hemostasis Achieved: N/A N/A N/A Procedural Pain: N/A N/A N/A Post Procedural Pain: N/A N/A N/A Debridement Treatment N/A N/A N/A Response: Post Debridement N/A N/A N/A Measurements L x W x D (cm) Post Debridement N/A N/A N/A Volume: (cm) Mensing, Sharai ([id number]) Post Debridement N/A N/A N/A Stage: Periwound Skin Texture: No Abnormalities Noted Edema: No Edema: No Excoriation: No Induration: No Callus: No Crepitus: No Fluctuance: No Friable: No Rash: No Scarring: No Periwound Skin No Abnormalities Noted Maceration: No Dry/Scaly: Yes Moisture: Moist: No Maceration: No Dry/Scaly: No Periwound Skin Color: No Abnormalities Noted Atrophie Blanche: No Erythema:  Yes Cyanosis: No Ecchymosis: No Erythema: No Hemosiderin Staining: No Mottled: No Pallor: No Rubor: No Erythema Location: N/A N/A Circumferential Temperature: N/A N/A N/A Tenderness on No No No Palpation: Wound Preparation: N/A Ulcer Cleansing: Ulcer Cleansing: Rinsed/Irrigated with Rinsed/Irrigated with Saline Saline Topical Anesthetic Topical Anesthetic Applied: Xylocaine 4% Applied: Other: lidocaine Topical Solution 4% Procedures Performed: N/A N/A N/A Wound Number: [id number]  Photos: No Photos No Photos No Photos Wound Location: Left Elbow - Lateral Left Gluteus - Medial Right Calf - Posterior Wounding Event: Trauma Pressure Injury Pressure Injury Primary Etiology: Skin Tear Pressure Ulcer Pressure Ulcer Comorbid History: Cataracts, Lymphedema, Cataracts, Lymphedema, Cataracts, Lymphedema, Aspiration, Chronic Aspiration, Chronic Aspiration, Chronic Obstructive Pulmonary Obstructive Pulmonary Obstructive Pulmonary Disease (COPD), Disease (COPD), Disease (COPD), Arrhythmia, Congestive Arrhythmia, Congestive Arrhythmia, Congestive Heart Failure, Heart Failure, Heart Failure, Hypertension, Peripheral Hypertension, Peripheral Hypertension, Peripheral Venous Disease, Venous Disease, Venous Disease, Osteoarthritis, Dementia Osteoarthritis, Dementia Osteoarthritis, Dementia Date Acquired: 12/23/2015 12/11/2015 12/13/2015 VANECIA, LIMPERT ([id number]) Weeks of Treatment: 0 0 0 Wound Status: Open Open Open Measurements L x W x D 0.5x0.3x0.1 2x0.5x0.1 0.3x4.5x0.1 (cm) Area (cm) : 0.118 0.785 1.06 Volume (cm) : 0.012 0.079 0.106 % Reduction in Area: N/A N/A N/A % Reduction in Volume: N/A N/A N/A Undermining: No N/A N/A Classification: Full Thickness Without Category/Stage II Category/Stage II Exposed Support Structures Exudate Amount: Large Medium Small Exudate Type: Serosanguineous Serosanguineous Serous Exudate Color: red, brown red, brown amber Foul Odor After No No  No Cleansing: Odor Anticipated Due to N/A N/A N/A Product Use: Wound Margin: Distinct, outline attached N/A Distinct, outline attached Granulation Amount: None Present (0%) None Present (0%) Large (67-100%)  Granulation Quality: N/A N/A Red Necrotic Amount: Large (67-100%) Large (67-100%) None Present (0%) Necrotic Tissue: Adherent Slough Adherent Slough N/A Exposed Structures: Fascia: No Fascia: No Fascia: No Fat: No Fat: No Fat: No Tendon: No Tendon: No Tendon: No Muscle: No Muscle: No Muscle: No Joint: No Joint: No Joint: No Bone: No Bone: No Bone: No Limited to Skin Limited to Skin Limited to Skin Breakdown Breakdown Breakdown Epithelialization: None None None Debridement: Debridement ([id number]- N/A N/A 11047) Time-Out Taken: Yes N/A N/A Pain Control: Lidocaine 4% Topical N/A N/A Solution Tissue Debrided: Fibrin/Slough, Exudates, N/A N/A Subcutaneous Level: Skin/Subcutaneous N/A N/A Tissue Debridement Area (sq 0.15 N/A N/A cm): Instrument: Forceps, Scissors N/A N/A Bleeding: Minimum N/A N/A Hemostasis Achieved: Pressure N/A N/A Procedural Pain: 0 N/A N/A Post Procedural Pain: 0 N/A N/A Wieser, Seren ([id number]) Debridement Treatment Procedure was tolerated N/A N/A Response: well Post Debridement 0.5x0.3x0.1 N/A N/A Measurements L x W x D (cm) Post Debridement 0.012 N/A N/A Volume: (cm) Post Debridement N/A N/A N/A Stage: Periwound Skin Texture: Edema: No Edema: No No Abnormalities Noted Periwound Skin Moist: Yes Moist: Yes No Abnormalities Noted Moisture: Periwound Skin Color: Erythema: Yes Erythema: Yes Erythema: Yes Erythema Location: Circumferential Circumferential N/A Temperature: No Abnormality No Abnormality No Abnormality Tenderness on Yes No No Palpation: Wound Preparation: Ulcer Cleansing: Ulcer Cleansing: Ulcer Cleansing: Rinsed/Irrigated with Rinsed/Irrigated with Rinsed/Irrigated with Saline Saline Saline Topical Anesthetic Topical  Anesthetic Applied: Other: lidocaine Applied: Other: Lidocaine 4% 4% Procedures Performed: Debridement N/A N/A Treatment Notes Electronic Signature(s) Signed: 12/25/2015 5:53:23 PM By: Alejandro Mulling Entered By: Alejandro Mulling on 12/25/2015 13:48:16 Hostetler, Walda ([id number]) -------------------------------------------------------------------------------- Multi-Disciplinary Care Plan Details Patient Name: Anita Buba Date of Service: 12/25/2015 12:45 PM Medical Record Number: [id number] Patient Account Number: [id number] Date of Birth/Sex: 10/17/1928 (82 y.o. Female) Treating RN: Ashok Cordia, Debi Primary Care Physician: SYSTEM, PCP Other Clinician: Referring Physician: Treating Physician/Extender: Rudene Re in Treatment: 3 Active Inactive Abuse / Safety / Falls / Self Care Management Nursing Diagnoses: Potential for falls Goals: Patient will remain injury free Date Initiated: 12/04/2015 Goal Status: Active Interventions: Assess fall risk on admission and as needed Notes: Nutrition Nursing Diagnoses: Potential for alteratiion in Nutrition/Potential for imbalanced nutrition Goals: Patient/caregiver agrees to and verbalizes understanding of need to obtain nutritional consultation Date Initiated: 12/04/2015 Goal Status: Active Interventions: Provide education on nutrition Notes: Orientation to the Wound Care Program Nursing Diagnoses: Knowledge deficit related to the wound healing center program Goals: Patient/caregiver will verbalize understanding of the Wound Healing Center Program Date Initiated: 12/04/2015 LOKELANI, LUTES ([id number]) Goal Status: Active Interventions: Provide education on orientation to the wound center Notes: Venous Leg Ulcer Nursing Diagnoses: Actual venous Insuffiency (use after diagnosis is confirmed) Goals: Verify adequate tissue perfusion prior to therapeutic compression application Date Initiated: 12/04/2015 Goal Status:  Active Interventions: Provide education on venous insufficiency Notes: Wound/Skin Impairment Nursing Diagnoses: Impaired tissue integrity Goals: Ulcer/skin breakdown will heal within 14 weeks Date Initiated: 12/04/2015 Goal Status: Active Interventions: Assess patient/caregiver ability to obtain necessary supplies Notes: Electronic Signature(s) Signed: 12/25/2015 5:53:23 PM By: Alejandro Mulling Entered By: Alejandro Mulling on 12/25/2015 13:48:09 Marion, Kazia ([id number]) -------------------------------------------------------------------------------- Pain Assessment Details Patient Name: Anita Buba Date of Service: 12/25/2015 12:45 PM Medical Record Number: [id number] Patient Account Number: [id number] Date of Birth/Sex: [birth year] (82 y.o. Female) Treating RN: Ashok Cordia, Debi Primary Care Physician: SYSTEM, PCP Other Clinician: Referring Physician: Treating Physician/Extender: Rudene Re in Treatment: 3 Active Problems Location of Pain Severity and Description of Pain Patient Has Paino No  Site Locations With Dressing Change: No Pain Management and Medication Current Pain Management: Electronic Signature(s) Signed: 12/25/2015 5:53:23 PM By: Alejandro Mulling Entered By: Alejandro Mulling on 12/25/2015 12:57:51 Anita Buba ([id number]) -------------------------------------------------------------------------------- Patient/Caregiver Education Details Patient Name: Anita Buba Date of Service: 12/25/2015 12:45 PM Medical Record Number: [id number] Patient Account Number: [id number] Date of Birth/Gender: 28-Apr-1929 (82 y.o. Female) Treating RN: Ashok Cordia, Debi Primary Care Physician: SYSTEM, PCP Other Clinician: Referring Physician: Treating Physician/Extender: Rudene Re in Treatment: 3 Education Assessment Education Provided To: Patient Education Topics Provided Wound/Skin Impairment: Handouts: Other: change dressing as ordered Methods: Demonstration,  Explain/Verbal Responses: State content correctly Electronic Signature(s) Signed: 12/25/2015 5:53:23 PM By: Alejandro Mulling Entered By: Alejandro Mulling on 12/25/2015 13:56:24 Inscore, Yeilin ([id number]) -------------------------------------------------------------------------------- Wound Assessment Details Patient Name: Anita Buba Date of Service: 12/25/2015 12:45 PM Medical Record Number: [id number] Patient Account Number: [id number] Date of Birth/Sex: Jul 09, 1928 (82 y.o. Female) Treating RN: Ashok Cordia, Debi Primary Care Physician: SYSTEM, PCP Other Clinician: Referring Physician: Treating Physician/Extender: Rudene Re in Treatment: 3 Wound Status Wound Number: 1 Primary Infection - not elsewhere classified Etiology: Wound Location: Right Elbow Wound Open Wounding Event: Gradually Appeared Status: Date Acquired: 11/14/2015 Comorbid Cataracts, Lymphedema, Aspiration, Weeks Of Treatment: 3 History: Chronic Obstructive Pulmonary Disease Clustered Wound: No (COPD), Arrhythmia, Congestive Heart Failure, Hypertension, Peripheral Venous Disease, Osteoarthritis, Dementia Photos Photo Uploaded By: Alejandro Mulling on 12/25/2015 17:09:27 Wound Measurements Length: (cm) 3.5 Width: (cm) 2.7 Depth: (cm) 0.7 Area: (cm) 7.422 Volume: (cm) 5.195 % Reduction in Area: 32.5% % Reduction in Volume: 72.2% Epithelialization: None Undermining: Yes Starting Position (o'clock): 11 Ending Position (o'clock): 3 Maximum Distance: (cm) 2 Wound Description Full Thickness Without Exposed Foul Odor A Classification: Support Structures Due to Prod Wound Margin: Flat and Intact Exudate Large Amount: Exudate Type: Serosanguineous Beecher, Vesta ([id number]) fter Cleansing: Yes uct Use: No Exudate Color: red, brown Wound Bed Granulation Amount: None Present (0%) Exposed Structure Necrotic Amount: Large (67-100%) Fascia Exposed: No Necrotic Quality: Adherent Slough Fat Layer  Exposed: No Tendon Exposed: No Muscle Exposed: No Joint Exposed: No Bone Exposed: No Limited to Skin Breakdown Periwound Skin Texture Texture Color No Abnormalities Noted: No No Abnormalities Noted: No Localized Edema: Yes Erythema: Yes Erythema Location: Circumferential Moisture No Abnormalities Noted: No Temperature / Pain Moist: Yes Temperature: No Abnormality Tenderness on Palpation: Yes Wound Preparation Ulcer Cleansing: Rinsed/Irrigated with Saline Topical Anesthetic Applied: Other: lidocaine 4%, Treatment Notes Wound #1 (Right Elbow) 1. Cleansed with: Clean wound with Normal Saline 2. Anesthetic Topical Lidocaine 4% cream to wound bed prior to debridement 3. Peri-wound Care: Skin Prep 4. Dressing Applied: Santyl Ointment 5. Secondary Dressing Applied Bordered Foam Dressing Electronic Signature(s) Signed: 12/25/2015 5:53:23 PM By: Alejandro Mulling Entered By: Alejandro Mulling on 12/25/2015 13:07:28 Anita Buba ([id number]) -------------------------------------------------------------------------------- Wound Assessment Details Patient Name: Anita Buba Date of Service: 12/25/2015 12:45 PM Medical Record Number: [id number] Patient Account Number: [id number] Date of Birth/Sex: [birth year] (82 y.o. Female) Treating RN: Ashok Cordia, Debi Primary Care Physician: SYSTEM, PCP Other Clinician: Referring Physician: Treating Physician/Extender: Rudene Re in Treatment: 3 Wound Status Wound Number: 2 Primary Pressure Ulcer Etiology: Wound Location: Left Calcaneus Wound Open Wounding Event: Gradually Appeared Status: Date Acquired: 10/30/2015 Comorbid Cataracts, Lymphedema, Aspiration, Weeks Of Treatment: 2 History: Chronic Obstructive Pulmonary Disease Clustered Wound: No (COPD), Arrhythmia, Congestive Heart Failure, Hypertension, Peripheral Venous Disease, Osteoarthritis, Dementia Photos Photo Uploaded By: Alejandro Mulling on 12/25/2015 17:09:46 Wound  Measurements Length: (cm) 1.8 Width: (cm) 2.9 Depth: (cm) 0.1 Area: (cm) 4.1 Volume: (cm)  0.41 % Reduction in Area: 13% % Reduction in Volume: 13% Wound Description Classification: Unstageable/Unclassified Wound Margin: Flat and Intact Exudate Amount: Small Exudate Type: Serosanguineous Exudate Color: red, brown Foul Odor After Cleansing: No Wound Bed Granulation Amount: None Present (0%) Exposed Structure Necrotic Amount: Large (67-100%) Fascia Exposed: No Hearst, Adilynne ([id number]) Necrotic Quality: Eschar Fat Layer Exposed: No Tendon Exposed: No Muscle Exposed: No Joint Exposed: No Bone Exposed: No Limited to Skin Breakdown Periwound Skin Texture Texture Color No Abnormalities Noted: No No Abnormalities Noted: No Callus: No Atrophie Blanche: No Crepitus: No Cyanosis: No Excoriation: No Ecchymosis: No Fluctuance: No Erythema: No Friable: No Hemosiderin Staining: No Induration: No Mottled: No Localized Edema: No Pallor: No Rash: No Rubor: No Scarring: No Moisture No Abnormalities Noted: No Dry / Scaly: Yes Maceration: No Moist: No Wound Preparation Ulcer Cleansing: Not Cleansed Topical Anesthetic Applied: None Treatment Notes Wound #2 (Left Calcaneus) 1. Cleansed with: Clean wound with Normal Saline 2. Anesthetic Topical Lidocaine 4% cream to wound bed prior to debridement 3. Peri-wound Care: Skin Prep 4. Dressing Applied: Aquacel Ag 5. Secondary Dressing Applied Bordered Foam Dressing Electronic Signature(s) Signed: 12/25/2015 5:53:23 PM By: Alejandro Mulling Entered By: Alejandro Mulling on 12/25/2015 13:13:41 Wandell, Karissa ([id number]) JULIAN, ASKIN ([id number]) -------------------------------------------------------------------------------- Wound Assessment Details Patient Name: Anita Buba Date of Service: 12/25/2015 12:45 PM Medical Record Number: [id number] Patient Account Number: [id number] Date of Birth/Sex: 1929-01-21 (82 y.o.  Female) Treating RN: Ashok Cordia, Debi Primary Care Physician: SYSTEM, PCP Other Clinician: Referring Physician: Treating Physician/Extender: Rudene Re in Treatment: 3 Wound Status Wound Number: 3 Primary Trauma, Other Etiology: Wound Location: Right Hand - Dorsum Wound Healed - Epithelialized Wounding Event: Trauma Status: Date Acquired: 12/07/2015 Comorbid Cataracts, Lymphedema, Aspiration, Weeks Of Treatment: 2 History: Chronic Obstructive Pulmonary Disease Clustered Wound: No (COPD), Arrhythmia, Congestive Heart Failure, Hypertension, Peripheral Venous Disease, Osteoarthritis, Dementia Photos Photo Uploaded By: Alejandro Mulling on 12/25/2015 17:09:47 Wound Measurements Length: (cm) 0 Width: (cm) 0 Depth: (cm) 0 Area: (cm) 0 Volume: (cm) 0 % Reduction in Area: 100% % Reduction in Volume: 100% Epithelialization: Large (67-100%) Wound Description Classification: Partial Thickness Wound Margin: Flat and Intact Exudate Amount: None Present Wound Bed Granulation Amount: Large (67-100%) Exposed Structure Granulation Quality: Pink Fascia Exposed: No Necrotic Amount: None Present (0%) Fat Layer Exposed: No Tendon Exposed: No Amster, Amellia ([id number]) Muscle Exposed: No Joint Exposed: No Bone Exposed: No Limited to Skin Breakdown Periwound Skin Texture Texture Color No Abnormalities Noted: No No Abnormalities Noted: No Callus: No Atrophie Blanche: No Crepitus: No Cyanosis: No Excoriation: No Ecchymosis: No Fluctuance: No Erythema: No Friable: No Hemosiderin Staining: No Induration: No Mottled: No Localized Edema: No Pallor: No Rash: No Rubor: No Scarring: No Moisture No Abnormalities Noted: No Dry / Scaly: No Maceration: No Moist: Yes Wound Preparation Ulcer Cleansing: Rinsed/Irrigated with Saline Topical Anesthetic Applied: None Electronic Signature(s) Signed: 12/25/2015 5:53:23 PM By: Alejandro Mulling Entered By: Alejandro Mulling on  12/25/2015 13:40:16 Fly, Nara ([id number]) -------------------------------------------------------------------------------- Wound Assessment Details Patient Name: Anita Buba Date of Service: 12/25/2015 12:45 PM Medical Record Number: [id number] Patient Account Number: [id number] Date of Birth/Sex: 1928/11/19 (82 y.o. Female) Treating RN: Ashok Cordia, Debi Primary Care Physician: SYSTEM, PCP Other Clinician: Referring Physician: Treating Physician/Extender: Rudene Re in Treatment: 3 Wound Status Wound Number: 4 Primary Trauma, Other Etiology: Wound Location: Right Forearm Wound Open Wounding Event: Trauma Status: Date Acquired: 12/07/2015 Comorbid Cataracts, Lymphedema, Aspiration, Weeks Of Treatment: 2 History: Chronic Obstructive Pulmonary Disease Clustered Wound: No (COPD), Arrhythmia, Congestive Heart  Failure, Hypertension, Peripheral Venous Disease, Osteoarthritis, Dementia Wound Measurements Length: (cm) 0.2 Width: (cm) 2.5 Depth: (cm) 0.1 Area: (cm) 0.393 Volume: (cm) 0.039 % Reduction in Area: 49.9% % Reduction in Volume: 50.6% Epithelialization: None Tunneling: No Undermining: No Wound Description Classification: Partial Thickness Wound Margin: Distinct, outline attached Exudate Amount: Small Exudate Type: Serous Exudate Color: amber Foul Odor After Cleansing: No Wound Bed Granulation Amount: Medium (34-66%) Exposed Structure Granulation Quality: Red Fascia Exposed: No Necrotic Amount: Medium (34-66%) Fat Layer Exposed: No Necrotic Quality: Adherent Slough Tendon Exposed: No Muscle Exposed: No Joint Exposed: No Bone Exposed: No Limited to Skin Breakdown Periwound Skin Texture Texture Color No Abnormalities Noted: No No Abnormalities Noted: No Moisture Wuertz, Lizabeth ([id number]) No Abnormalities Noted: No Treatment Notes Wound #4 (Right Forearm) 1. Cleansed with: Clean wound with Normal Saline 2. Anesthetic Topical Lidocaine 4%  cream to wound bed prior to debridement 3. Peri-wound Care: Skin Prep 5. Secondary Dressing Applied Bordered Foam Dressing Electronic Signature(s) Signed: 12/25/2015 5:53:23 PM By: Alejandro Mulling Entered By: Alejandro Mulling on 12/25/2015 13:10:26 Anita Buba ([id number]) -------------------------------------------------------------------------------- Wound Assessment Details Patient Name: Anita Buba Date of Service: 12/25/2015 12:45 PM Medical Record Number: [id number] Patient Account Number: [id number] Date of Birth/Sex: 07/31/1928 (82 y.o. Female) Treating RN: Ashok Cordia, Debi Primary Care Physician: SYSTEM, PCP Other Clinician: Referring Physician: Treating Physician/Extender: Rudene Re in Treatment: 3 Wound Status Wound Number: 5 Primary Pressure Ulcer Etiology: Wound Location: Left Lower Leg Wound Open Wounding Event: Gradually Appeared Status: Date Acquired: 12/13/2015 Comorbid Cataracts, Lymphedema, Aspiration, Weeks Of Treatment: 1 History: Chronic Obstructive Pulmonary Disease Clustered Wound: No (COPD), Arrhythmia, Congestive Heart Failure, Hypertension, Peripheral Venous Disease, Osteoarthritis, Dementia Photos Photo Uploaded By: Alejandro Mulling on 12/25/2015 17:10:47 Wound Measurements Length: (cm) 0.7 Width: (cm) 3.5 Depth: (cm) 0.1 Area: (cm) 1.924 Volume: (cm) 0.192 % Reduction in Area: 45.6% % Reduction in Volume: 45.6% Wound Description Classification: Category/Stage II Wound Margin: Indistinct, nonvisible Exudate Amount: Large Exudate Type: Serous Exudate Color: amber Foul Odor After Cleansing: No Wound Bed Granulation Amount: Medium (34-66%) Exposed Structure Granulation Quality: Red Fascia Exposed: No Vargo, Seriyah ([id number]) Necrotic Amount: Small (1-33%) Fat Layer Exposed: No Necrotic Quality: Adherent Slough Tendon Exposed: No Muscle Exposed: No Joint Exposed: No Bone Exposed: No Limited to Skin Breakdown Periwound  Skin Texture Texture Color No Abnormalities Noted: No No Abnormalities Noted: No Callus: No Atrophie Blanche: No Crepitus: No Cyanosis: No Excoriation: No Ecchymosis: No Fluctuance: No Erythema: No Friable: No Hemosiderin Staining: No Induration: No Mottled: No Localized Edema: No Pallor: No Rash: No Rubor: No Scarring: No Moisture No Abnormalities Noted: No Dry / Scaly: No Maceration: No Moist: No Wound Preparation Ulcer Cleansing: Rinsed/Irrigated with Saline Topical Anesthetic Applied: Xylocaine 4% Topical Solution Treatment Notes Wound #5 (Left Lower Leg) 1. Cleansed with: Clean wound with Normal Saline 2. Anesthetic Topical Lidocaine 4% cream to wound bed prior to debridement Notes keep clean and dry Electronic Signature(s) Signed: 12/25/2015 5:53:23 PM By: Alejandro Mulling Entered By: Alejandro Mulling on 12/25/2015 13:16:16 Midkiff, Trishna ([id number]) -------------------------------------------------------------------------------- Wound Assessment Details Patient Name: Anita Buba Date of Service: 12/25/2015 12:45 PM Medical Record Number: [id number] Patient Account Number: [id number] Date of Birth/Sex: [birth year] (82 y.o. Female) Treating RN: Ashok Cordia, Debi Primary Care Physician: SYSTEM, PCP Other Clinician: Referring Physician: Treating Physician/Extender: Rudene Re in Treatment: 3 Wound Status Wound Number: 6 Primary Pressure Ulcer Etiology: Wound Location: Left Metatarsal head first - Medial Wound Open Status: Wounding Event: Pressure Injury Comorbid Cataracts, Lymphedema, Aspiration, Date Acquired: 12/25/2015  History: Chronic Obstructive Pulmonary Disease Weeks Of Treatment: 0 (COPD), Arrhythmia, Congestive Heart Clustered Wound: No Failure, Hypertension, Peripheral Venous Disease, Osteoarthritis, Dementia Photos Photo Uploaded By: Alejandro Mulling on 12/25/2015 17:10:47 Wound Measurements Length: (cm) 0.8 Width: (cm) 1 Depth:  (cm) 0.1 Area: (cm) 0.628 Volume: (cm) 0.063 % Reduction in Area: % Reduction in Volume: Epithelialization: None Tunneling: No Undermining: No Wound Description Classification: Category/Stage II Wound Margin: Distinct, outline attached Exudate Amount: Small Exudate Type: Serous Exudate Color: amber Foul Odor After Cleansing: No Wound Bed Granulation Amount: None Present (0%) Exposed Structure Necrotic Amount: Large (67-100%) Fascia Exposed: No Titsworth, Kyesha ([id number]) Necrotic Quality: Eschar Fat Layer Exposed: No Tendon Exposed: No Muscle Exposed: No Joint Exposed: No Bone Exposed: No Limited to Skin Breakdown Periwound Skin Texture Texture Color No Abnormalities Noted: No No Abnormalities Noted: No Localized Edema: No Erythema: Yes Erythema Location: Circumferential Moisture No Abnormalities Noted: No Dry / Scaly: Yes Maceration: No Wound Preparation Ulcer Cleansing: Rinsed/Irrigated with Saline Topical Anesthetic Applied: Other: lidocaine 4%, Treatment Notes Wound #6 (Left, Medial Metatarsal head first) 1. Cleansed with: Clean wound with Normal Saline 2. Anesthetic Topical Lidocaine 4% cream to wound bed prior to debridement 3. Peri-wound Care: Skin Prep 5. Secondary Dressing Applied Bordered Foam Dressing Electronic Signature(s) Signed: 12/25/2015 5:53:23 PM By: Alejandro Mulling Entered By: Alejandro Mulling on 12/25/2015 13:20:08 Anita Buba ([id number]) -------------------------------------------------------------------------------- Wound Assessment Details Patient Name: Anita Buba Date of Service: 12/25/2015 12:45 PM Medical Record Number: [id number] Patient Account Number: [id number] Date of Birth/Sex: 1929-02-14 (82 y.o. Female) Treating RN: Ashok Cordia, Debi Primary Care Physician: SYSTEM, PCP Other Clinician: Referring Physician: Treating Physician/Extender: Rudene Re in Treatment: 3 Wound Status Wound Number: 7 Primary Skin  Tear Etiology: Wound Location: Left Elbow - Lateral Wound Open Wounding Event: Trauma Status: Date Acquired: 12/23/2015 Comorbid Cataracts, Lymphedema, Aspiration, Weeks Of Treatment: 0 History: Chronic Obstructive Pulmonary Disease Clustered Wound: No (COPD), Arrhythmia, Congestive Heart Failure, Hypertension, Peripheral Venous Disease, Osteoarthritis, Dementia Photos Photo Uploaded By: Alejandro Mulling on 12/25/2015 17:11:12 Wound Measurements Length: (cm) 0.5 Width: (cm) 0.3 Depth: (cm) 0.1 Area: (cm) 0.118 Volume: (cm) 0.012 % Reduction in Area: % Reduction in Volume: Epithelialization: None Tunneling: No Undermining: No Wound Description Full Thickness Without Exposed Classification: Support Structures Wound Margin: Distinct, outline attached Exudate Large Amount: Exudate Type: Serosanguineous Exudate Color: red, brown Foul Odor After Cleansing: No Wound Bed Senner, Shany ([id number]) Granulation Amount: None Present (0%) Exposed Structure Necrotic Amount: Large (67-100%) Fascia Exposed: No Necrotic Quality: Adherent Slough Fat Layer Exposed: No Tendon Exposed: No Muscle Exposed: No Joint Exposed: No Bone Exposed: No Limited to Skin Breakdown Periwound Skin Texture Texture Color No Abnormalities Noted: No No Abnormalities Noted: No Localized Edema: No Erythema: Yes Erythema Location: Circumferential Moisture No Abnormalities Noted: No Temperature / Pain Moist: Yes Temperature: No Abnormality Tenderness on Palpation: Yes Wound Preparation Ulcer Cleansing: Rinsed/Irrigated with Saline Topical Anesthetic Applied: Other: lidocaine 4%, Treatment Notes Wound #7 (Left, Lateral Elbow) 1. Cleansed with: Clean wound with Normal Saline 2. Anesthetic Topical Lidocaine 4% cream to wound bed prior to debridement 3. Peri-wound Care: Skin Prep 5. Secondary Dressing Applied Bordered Foam Dressing Electronic Signature(s) Signed: 12/25/2015 5:53:23 PM  By: Alejandro Mulling Entered By: Alejandro Mulling on 12/25/2015 13:23:25 Anita Buba ([id number]) -------------------------------------------------------------------------------- Wound Assessment Details Patient Name: Anita Buba Date of Service: 12/25/2015 12:45 PM Medical Record Number: [id number] Patient Account Number: [id number] Date of Birth/Sex: 1929-02-28 (82 y.o. Female) Treating RN: Phillis Haggis Primary Care Physician: SYSTEM, PCP Other Clinician: Referring  Physician: Treating Physician/Extender: Rudene Re in Treatment: 3 Wound Status Wound Number: 8 Primary Pressure Ulcer Etiology: Wound Location: Left Gluteus - Medial Wound Open Wounding Event: Pressure Injury Status: Date Acquired: 12/11/2015 Comorbid Cataracts, Lymphedema, Aspiration, Weeks Of Treatment: 0 History: Chronic Obstructive Pulmonary Disease Clustered Wound: No (COPD), Arrhythmia, Congestive Heart Failure, Hypertension, Peripheral Venous Disease, Osteoarthritis, Dementia Photos Photo Uploaded By: Alejandro Mulling on 12/25/2015 17:11:12 Wound Measurements Length: (cm) 2 Width: (cm) 0.5 Depth: (cm) 0.1 Area: (cm) 0.785 Volume: (cm) 0.079 % Reduction in Area: % Reduction in Volume: Epithelialization: None Wound Description Classification: Category/Stage II Exudate Amount: Medium Exudate Type: Serosanguineous Exudate Color: red, brown Foul Odor After Cleansing: No Wound Bed Granulation Amount: None Present (0%) Exposed Structure Necrotic Amount: Large (67-100%) Fascia Exposed: No Necrotic Quality: Adherent Slough Fat Layer Exposed: No Monnig, Patrina ([id number]) Tendon Exposed: No Muscle Exposed: No Joint Exposed: No Bone Exposed: No Limited to Skin Breakdown Periwound Skin Texture Texture Color No Abnormalities Noted: No No Abnormalities Noted: No Localized Edema: No Erythema: Yes Erythema Location: Circumferential Moisture No Abnormalities Noted: No Temperature /  Pain Moist: Yes Temperature: No Abnormality Wound Preparation Ulcer Cleansing: Rinsed/Irrigated with Saline Topical Anesthetic Applied: Other: Lidocaine 4%, Treatment Notes Wound #8 (Left, Medial Gluteus) 1. Cleansed with: Clean wound with Normal Saline 2. Anesthetic Topical Lidocaine 4% cream to wound bed prior to debridement 3. Peri-wound Care: Skin Prep 4. Dressing Applied: Aquacel Ag 5. Secondary Dressing Applied Bordered Foam Dressing Electronic Signature(s) Signed: 12/25/2015 5:53:23 PM By: Alejandro Mulling Entered By: Alejandro Mulling on 12/25/2015 13:27:07 Anita Buba ([id number]) -------------------------------------------------------------------------------- Wound Assessment Details Patient Name: Anita Buba Date of Service: 12/25/2015 12:45 PM Medical Record Number: [id number] Patient Account Number: [id number] Date of Birth/Sex: 23-Jun-1929 (82 y.o. Female) Treating RN: Ashok Cordia, Debi Primary Care Physician: SYSTEM, PCP Other Clinician: Referring Physician: Treating Physician/Extender: Rudene Re in Treatment: 3 Wound Status Wound Number: 9 Primary Pressure Ulcer Etiology: Wound Location: Right Calf - Posterior Wound Open Wounding Event: Pressure Injury Status: Date Acquired: 12/13/2015 Comorbid Cataracts, Lymphedema, Aspiration, Weeks Of Treatment: 0 History: Chronic Obstructive Pulmonary Disease Clustered Wound: No (COPD), Arrhythmia, Congestive Heart Failure, Hypertension, Peripheral Venous Disease, Osteoarthritis, Dementia Photos Photo Uploaded By: Alejandro Mulling on 12/25/2015 17:11:34 Wound Measurements Length: (cm) 0.3 Width: (cm) 4.5 Depth: (cm) 0.1 Area: (cm) 1.06 Volume: (cm) 0.106 % Reduction in Area: % Reduction in Volume: Epithelialization: None Wound Description Classification: Category/Stage II Wound Margin: Distinct, outline attached Exudate Amount: Small Exudate Type: Serous Exudate Color: amber Foul Odor After  Cleansing: No Wound Bed Granulation Amount: Large (67-100%) Exposed Structure Granulation Quality: Red Fascia Exposed: No Fortson, Rida ([id number]) Necrotic Amount: None Present (0%) Fat Layer Exposed: No Tendon Exposed: No Muscle Exposed: No Joint Exposed: No Bone Exposed: No Limited to Skin Breakdown Periwound Skin Texture Texture Color No Abnormalities Noted: No No Abnormalities Noted: No Erythema: Yes Moisture No Abnormalities Noted: No Temperature / Pain Temperature: No Abnormality Wound Preparation Ulcer Cleansing: Rinsed/Irrigated with Saline Treatment Notes Wound #9 (Right, Posterior Calf) 1. Cleansed with: Clean wound with Normal Saline 2. Anesthetic Topical Lidocaine 4% cream to wound bed prior to debridement Notes keep clean and dry Electronic Signature(s) Signed: 12/25/2015 5:53:23 PM By: Alejandro Mulling Entered By: Alejandro Mulling on 12/25/2015 13:36:46 Collard, Tawona ([id number]) -------------------------------------------------------------------------------- Vitals Details Patient Name: Anita Buba Date of Service: 12/25/2015 12:45 PM Medical Record Number: [id number] Patient Account Number: [id number] Date of Birth/Sex: 01/24/1929 (82 y.o. Female) Treating RN: Phillis Haggis Primary Care Physician: SYSTEM, PCP Other Clinician: Referring  Physician: Treating Physician/Extender: Rudene Re in Treatment: 3 Vital Signs Time Taken: 12:58 Temperature (F): 97.8 Height (in): 64 Pulse (bpm): 105 Weight (lbs): 153 Respiratory Rate (breaths/min): 16 Body Mass Index (BMI): 26.3 Blood Pressure (mmHg): 126/77 Reference Range: 80 - 120 mg / dl Electronic Signature(s) Signed: 12/25/2015 5:53:23 PM By: Alejandro Mulling Entered By: Alejandro Mulling on 12/25/2015 13:01:48

## 2015-12-27 ENCOUNTER — Ambulatory Visit (INDEPENDENT_AMBULATORY_CARE_PROVIDER_SITE_OTHER): Payer: Medicare Other | Admitting: Cardiovascular Disease

## 2015-12-27 ENCOUNTER — Encounter: Payer: Self-pay | Admitting: Cardiovascular Disease

## 2015-12-27 VITALS — BP 122/64 | HR 98 | Ht 64.0 in | Wt 153.0 lb

## 2015-12-27 DIAGNOSIS — D539 Nutritional anemia, unspecified: Secondary | ICD-10-CM

## 2015-12-27 DIAGNOSIS — R600 Localized edema: Secondary | ICD-10-CM

## 2015-12-27 DIAGNOSIS — I482 Chronic atrial fibrillation, unspecified: Secondary | ICD-10-CM

## 2015-12-27 DIAGNOSIS — R531 Weakness: Secondary | ICD-10-CM

## 2015-12-27 DIAGNOSIS — R0602 Shortness of breath: Secondary | ICD-10-CM

## 2015-12-27 DIAGNOSIS — I4891 Unspecified atrial fibrillation: Secondary | ICD-10-CM

## 2015-12-27 DIAGNOSIS — I5022 Chronic systolic (congestive) heart failure: Secondary | ICD-10-CM

## 2015-12-27 NOTE — Progress Notes (Signed)
Patient ID: [name], female   DOB: [birth year], 82 y.o.   MRN: [id number] Cardiology Office Note  Date:  12/27/2015   ID:  [name], DOB [birth year], MRN [id number]  PCP:  Pcp Not In System   Chief Complaint  Patient presents with  . other    6 month follow up. Meds reviewed by the patient's med list. Pt. c/o swelling all over, shortness of breath and wheezing.     HPI:  [name] is a 82 year old woman with history of HTN, gait instability, cognitive impairment, depression, and panic attacks who returns for routine follow up of a-fib with RVR and chronic systolic CHF.   She lives in a skilled nursing facility, Family helps to take care of her Previous ejection fraction 45-50%, significant MR, TR, mildly elevated right heart pressures  She presents today in a wheelchair. She slept through most of the evaluation today Review of lab work shows gradual decline in blood count, hematocrit previously 36 (6 months ago now, now 28 Worsening leg swelling in the past several months Recently in the emergency room 12/17/2015 for increasing leg edema Recommended to increase Lasix up to 40 mill grams twice a day for 3 days Unclear if this helped her symptoms which have persisted Unable to wear compression hose, this caused wounds on her legs She does have visiting nurse that comes daily  EKG shows atrial fibrillation with ventricular rate 86 bpm  Other past medical history Previous hospitalization 08/13/2015 for parotiditis, sepsis, atrial fibrillation with RVR Treated with antibiotics with improvement of her symptoms Potassium was low at the time of admission  essentially wheelchair bound as her legs are now very weak. significant tremor.  She was in the hospital January 2016 for atrial fibrillation, acute systolic CHF [facility] 7/82-9/[id number]/21301/15-1/18/2016   Prior to her above admission January 2016, over the past 2-3 weeks she noted a sensation of "hot" and would pat her chest. Per  her sister her care providers felt like this was her anxiety. No work up was pursued at that time. She denied any chest pain. Ultimately, her weakness continued to progress to the point that she was unable to stand from a seated postion, prompting her to be [facility]. Weight has remained stable.   [facility] she was found to be in new onset a-fib with RVR, rates into the 160s at times, She was already on propranolol for her anxiety. Her TSH was found to be in the 6 range with normal free T4. Echo showed EF 45-50%, dilated LA of 4.5 cm, dilated RA, moderate to severe MR, moderate TR, mildly elevated PASP at 45.2 mm Hg.  Head CT showed chronic infarcts along the right frontal and temporal lobes, with associated encephalomalacia.   CHADSVASc score >= 6 giving her an annual estimated risk of stroke of 9%.   PMH:   has a past medical history of Persistent atrial fibrillation (HCC); Chronic systolic CHF (congestive heart failure) (HCC); HTN (hypertension); Cognitive impairment; Gait instability; Depression; Panic attacks; Arthritis; Hyperlipidemia; Parkinson's disease (HCC); and Anxiety disorder.  PSH:    Past Surgical History  Procedure Laterality Date  . Eye surgery      Current Outpatient Prescriptions  Medication Sig Dispense Refill  . acetaminophen (TYLENOL) 325 MG tablet Take 650 mg by mouth every 6 (six) hours as needed for mild pain, moderate pain or fever.    . Amantadine HCl 100 MG tablet Take 100 mg by  mouth 2 (two) times daily.     Marland Kitchen. apixaban (ELIQUIS) 5 MG TABS tablet Take 1 tablet (5 mg total) by mouth 2 (two) times daily. 60 tablet 0  . docusate sodium (COLACE) 100 MG capsule Take 100 mg by mouth 2 (two) times daily.    . fexofenadine (ALLEGRA) 180 MG tablet Take 180 mg by mouth daily.    . furosemide (LASIX) 20 MG tablet Take 20 mg by mouth daily.    Marland Kitchen. guaiFENesin-dextromethorphan (ROBITUSSIN DM) 100-10 MG/5ML syrup Take 10 mLs by mouth  every 6 (six) hours as needed for cough.    Marland Kitchen. ipratropium-albuterol (DUONEB) 0.5-2.5 (3) MG/3ML SOLN Take 3 mLs by nebulization 2 (two) times daily. 360 mL 1  . levothyroxine (SYNTHROID, LEVOTHROID) 50 MCG tablet Take 50 mcg by mouth daily after breakfast.     . magnesium hydroxide (MILK OF MAGNESIA) 400 MG/5ML suspension Take 5 mLs by mouth 4 (four) times daily as needed for mild constipation or moderate constipation.    . metoprolol (LOPRESSOR) 50 MG tablet Take 50 mg by mouth 2 (two) times daily.     . montelukast (SINGULAIR) 10 MG tablet Take 10 mg by mouth at bedtime.     . Multiple Vitamin (MULTIVITAMIN) tablet Take 1 tablet by mouth daily.    Marland Kitchen. PARoxetine (PAXIL) 20 MG tablet Take 20 mg by mouth every morning.     . potassium chloride SA (K-DUR,KLOR-CON) 20 MEQ tablet Take 40 mEq by mouth 2 (two) times daily.      No current facility-administered medications for this visit.     Allergies:   Aliskiren-hydrochlorothiazide; Amlodipine; and Sulfa antibiotics   Social History:  The patient  reports that she has never smoked. She does not have any smokeless tobacco history on file. She reports that she does not drink alcohol or use illicit drugs.   Family History:   family history includes Heart disease in her sister.    Review of Systems: Review of Systems  Unable to perform ROS    PHYSICAL EXAM: VS:  BP 122/64 mmHg  Pulse 98  Ht 5\' 4"  (1.626 m)  Wt 153 lb (69.4 kg)  BMI 26.25 kg/m2 , BMI Body mass index is 26.25 kg/(m^2). GEN: Well nourished, well developed, in no acute distress HEENT: normal Neck: no JVD, carotid bruits, or masses Cardiac: RRR; no murmurs, rubs, or gallops,no edema  Respiratory:  clear to auscultation bilaterally, normal work of breathing GI: soft, nontender, nondistended, + BS MS: no deformity or atrophy Skin: warm and dry, no rash Neuro:  Strength and sensation are intact Psych: euthymic mood, full affect   Recent Labs: 09/17/2015: ALT  29 09/19/2015: Magnesium 2.0 12/17/2015: B Natriuretic Peptide 251.0*; BUN 18; Creatinine, Ser 0.98; Hemoglobin 9.3*; Platelets 298; Potassium 3.9; Sodium 140    Lipid Panel No results found for: CHOL, HDL, LDLCALC, TRIG    Wt Readings from Last 3 Encounters:  12/27/15 153 lb (69.4 kg)  12/17/15 160 lb (72.576 kg)  10/02/15 151 lb 3.2 oz (68.584 kg)       ASSESSMENT AND PLAN:  Weakness - Plan: Iron Binding Cap (TIBC), Ferritin, Iron, Transferrin Tremendously deconditioned, does not ambulate anymore Also with anemia  Chronic atrial fibrillation (HCC) - Plan: EKG 12-Lead On anticoagulation, rate well controlled on metoprolol  Anemia associated with nutritional deficiency, unspecified nutritional anemia type - Plan: Iron Binding Cap (TIBC), Ferritin, Iron, Transferrin Iron studies drawn today after long discussion with the daughter May need iron supplementation  Chronic systolic  CHF (congestive heart failure) (HCC)  Will increase Lasix up to 40 mg in the morning, 20 mg after lunch Continue potassium supplement  Leg edema Likely multifactorial including venous sufficiency, anemia and systolic/diastolic CHF Will increase Lasix as above, Recommended Ace wraps during the daytime, leg elevation   Total encounter time more than 25 minutes  Greater than 50% was spent in counseling and coordination of care with the patient  Disposition:   F/U  3 months   Orders Placed This Encounter  Procedures  . Iron Binding Cap (TIBC)  . Ferritin  . Iron  . Transferrin  . EKG 12-Lead     Signed, Dossie Arbour, M.D., Ph.D. 12/27/2015  [facility] [place], [place] [phone]

## 2015-12-27 NOTE — Patient Instructions (Signed)
Medication Instructions:   Please increase lasix up to 40 mg in the AM, 20 mg at 2 pm  Home health nurse to place ACE wraps daily on lower extremities Please take ace wraps off before bed  Labwork:  We will check iron studies for anemia  Testing/Procedures:  No further testing  Follow-Up: It was a pleasure seeing you in the office today. Please call us if you have new issues that need to be addressed before your next appt.  [phone]  Your physician wants you to follow-up in: 3 months.  You will receive a reminder letter in the mail two months in advance. If you don't receive a letter, please call our office to schedule the follow-up appointment.  If you need a refill on your cardiac medications before your next appointment, please call your pharmacy.

## 2015-12-28 LAB — TRANSFERRIN: Transferrin: 220 mg/dL (ref 200–370)

## 2015-12-28 LAB — IRON AND TIBC
Iron Saturation: 28 % (ref 15–55)
Iron: 71 ug/dL (ref 27–139)
Total Iron Binding Capacity: 255 ug/dL (ref 250–450)
UIBC: 184 ug/dL (ref 118–369)

## 2015-12-28 LAB — FERRITIN: Ferritin: 115 ng/mL (ref 15–150)

## 2016-01-01 ENCOUNTER — Encounter: Payer: Medicare Other | Attending: Surgery | Admitting: Surgery

## 2016-01-01 DIAGNOSIS — S41101A Unspecified open wound of right upper arm, initial encounter: Secondary | ICD-10-CM | POA: Diagnosis not present

## 2016-01-01 DIAGNOSIS — L89322 Pressure ulcer of left buttock, stage 2: Secondary | ICD-10-CM | POA: Diagnosis not present

## 2016-01-01 DIAGNOSIS — I110 Hypertensive heart disease with heart failure: Secondary | ICD-10-CM | POA: Insufficient documentation

## 2016-01-01 DIAGNOSIS — S41102A Unspecified open wound of left upper arm, initial encounter: Secondary | ICD-10-CM | POA: Insufficient documentation

## 2016-01-01 DIAGNOSIS — I4891 Unspecified atrial fibrillation: Secondary | ICD-10-CM | POA: Diagnosis not present

## 2016-01-01 DIAGNOSIS — I890 Lymphedema, not elsewhere classified: Secondary | ICD-10-CM | POA: Insufficient documentation

## 2016-01-01 DIAGNOSIS — I5040 Unspecified combined systolic (congestive) and diastolic (congestive) heart failure: Secondary | ICD-10-CM | POA: Diagnosis not present

## 2016-01-01 DIAGNOSIS — X58XXXA Exposure to other specified factors, initial encounter: Secondary | ICD-10-CM | POA: Insufficient documentation

## 2016-01-01 DIAGNOSIS — F0390 Unspecified dementia without behavioral disturbance: Secondary | ICD-10-CM | POA: Diagnosis not present

## 2016-01-01 DIAGNOSIS — L89629 Pressure ulcer of left heel, unspecified stage: Secondary | ICD-10-CM | POA: Diagnosis not present

## 2016-01-01 DIAGNOSIS — Z7901 Long term (current) use of anticoagulants: Secondary | ICD-10-CM | POA: Insufficient documentation

## 2016-01-01 DIAGNOSIS — J449 Chronic obstructive pulmonary disease, unspecified: Secondary | ICD-10-CM | POA: Insufficient documentation

## 2016-01-01 NOTE — Progress Notes (Addendum)
NIAH, HEINLE ([id number]) Visit Report for 01/01/2016 Arrival Information Details Patient Name: LARUA, COLLIER Date of Service: 01/01/2016 10:00 AM Medical Record Number: [id number] Patient Account Number: [id number] Date of Birth/Sex: 11-18-1928 (82 y.o. Female) Treating RN: Afful, RN, BSN, [place] Sink Primary Care Physician: SYSTEM, PCP Other Clinician: Referring Physician: Treating Physician/Extender: Rudene Re in Treatment: 4 Visit Information History Since Last Visit Added or deleted any medications: No Patient Arrived: Wheel Chair Hospitalized since last visit: No Arrival Time: 10:06 Has Dressing in Place as Prescribed: Yes Accompanied By: sister Pain Present Now: No Transfer Assistance: EasyPivot Patient Lift Patient Identification Verified: Yes Secondary Verification Process Yes Completed: Patient Requires Transmission- No Based Precautions: Patient Has Alerts: Yes Patient Alerts: Patient on Blood Thinner ABI: (L) 1.56 Eliquis Electronic Signature(s) Signed: 01/01/2016 10:07:38 AM By: Elpidio Eric BSN, RN Entered By: Elpidio Eric on 01/01/2016 10:07:38 [name] ([id number]) -------------------------------------------------------------------------------- Encounter Discharge Information Details Patient Name: [name] Date of Service: 01/01/2016 10:00 AM Medical Record Number: [id number] Patient Account Number: [id number] Date of Birth/Sex: 07/13/1928 (82 y.o. Female) Treating RN: Afful, RN, BSN, [place] Sink Primary Care Physician: SYSTEM, PCP Other Clinician: Referring Physician: Treating Physician/Extender: Rudene Re in Treatment: 4 Encounter Discharge Information Items Discharge Pain Level: 0 Discharge Condition: Stable Ambulatory Status: Wheelchair Discharge Destination: Nursing Home Transportation: Other Accompanied By: sister Schedule Follow-up Appointment: No Medication Reconciliation completed No and provided to Patient/Care Danta Baumgardner: Provided  on Clinical Summary of Care: 01/01/2016 Form Type Recipient Paper Patient MJ Electronic Signature(s) Signed: 01/01/2016 12:57:48 PM By: Elpidio Eric BSN, RN Previous Signature: 01/01/2016 10:58:26 AM Version By: Gwenlyn Perking Entered By: Elpidio Eric on 01/01/2016 12:57:48 [name] ([id number]) -------------------------------------------------------------------------------- Lower Extremity Assessment Details Patient Name: [name] Date of Service: 01/01/2016 10:00 AM Medical Record Number: [id number] Patient Account Number: [id number] Date of Birth/Sex: [birth year] (82 y.o. Female) Treating RN: Afful, RN, BSN, Rita Primary Care Physician: SYSTEM, PCP Other Clinician: Referring Physician: Treating Physician/Extender: Rudene Re in Treatment: 4 Edema Assessment Assessed: [Left: No] [Right: No] E[Left: dema] [Right: :] Calf Left: Right: Point of Measurement: 28 cm From Medial Instep 39.7 cm 39.3 cm Ankle Left: Right: Point of Measurement: 10 cm From Medial Instep 27.5 cm 27.4 cm Vascular Assessment Pulses: Posterior Tibial Palpable: [Left:No] [Right:No] Dorsalis Pedis Doppler: [Left:Monophasic] [Right:Monophasic] Extremity colors, hair growth, and conditions: Extremity Color: [Left:Mottled] [Right:Mottled] Hair Growth on Extremity: [Left:No] [Right:No] Temperature of Extremity: [Left:Warm] [Right:Warm] Capillary Refill: [Left:< 3 seconds] [Right:< 3 seconds] Toe Nail Assessment Left: Right: Thick: Yes Yes Discolored: Yes Yes Deformed: No No Improper Length and Hygiene: No No Electronic Signature(s) Signed: 01/01/2016 10:09:02 AM By: Elpidio Eric BSN, RN Entered By: Elpidio Eric on 01/01/2016 10:09:02 [name] ([name] ([id number]) -------------------------------------------------------------------------------- Multi Wound Chart Details Patient Name: [name] Date of Service: 01/01/2016 10:00 AM Medical Record Number: [id number] Patient Account  Number: [id number] Date of Birth/Sex: 18-Feb-1929 (82 y.o. Female) Treating RN: Afful, RN, BSN, [place] Sink Primary Care Physician: SYSTEM, PCP Other Clinician: Referring Physician: Treating Physician/Extender: Rudene Re in Treatment: 4 Vital Signs Height(in): 64 Pulse(bpm): 79 Weight(lbs): 153 Blood Pressure 105/57 (mmHg): Body Mass Index(BMI): 26 Temperature(F): 98.1 Respiratory Rate 17 (breaths/min): Photos: [1:No Photos] [2:No Photos] [4:No Photos] Wound Location: [1:Right Elbow] [2:Left Calcaneus] [4:Right Forearm] Wounding Event: [1:Gradually Appeared] [2:Gradually Appeared] [4:Trauma] Primary Etiology: [1:Infection - not elsewhere classified] [2:Pressure Ulcer] [4:Trauma, Other] Comorbid History: [1:Cataracts, Lymphedema, Aspiration, Chronic Obstructive Pulmonary Disease (COPD), Arrhythmia, Congestive Heart Failure, Hypertension, Peripheral Venous Disease, Osteoarthritis, Dementia] [2:Cataracts, Lymphedema, Aspiration, Chronic  Obstructive Pulmonary  Disease (COPD), Arrhythmia, Congestive Heart Failure, Hypertension, Peripheral Venous Disease, Osteoarthritis, Dementia] [4:Cataracts, Lymphedema, Aspiration, Chronic Obstructive Pulmonary Disease (COPD), Arrhythmia, Congestive  Heart Failure, Hypertension, Peripheral Venous Disease, Osteoarthritis, Dementia] Date Acquired: [1:11/14/2015] [2:10/30/2015] [4:12/07/2015] Weeks of Treatment: [1:4] [2:3] [4:3] Wound Status: [1:Open] [2:Open] [4:Healed - Epithelialized] Measurements L x W x D 3.4x3.4x0.7 [2:1.5x2.5x0.1] [4:0x0x0] (cm) Area (cm) : [1:9.079] [2:2.945] [4:0] Volume (cm) : [1:6.355] [2:0.295] [4:0] % Reduction in Area: [1:17.40%] [2:37.50%] [4:100.00%] % Reduction in Volume: 66.00% [2:37.40%] [4:100.00%] Classification: [1:Full Thickness Without Exposed Support Structures] [2:Unstageable/Unclassified] [4:Partial Thickness] Exudate Amount: [1:Large] [2:Small] [4:None Present] Exudate Type: [1:Serosanguineous]  [2:Serosanguineous] [4:N/A] Exudate Color: [1:red, brown Yes] [2:red, brown No] [4:N/A No] Foul Odor After Cleansing: Odor Anticipated Due to No N/A N/A Product Use: Wound Margin: Flat and Intact Flat and Intact Distinct, outline attached Granulation Amount: None Present (0%) None Present (0%) None Present (0%) Granulation Quality: N/A N/A N/A Necrotic Amount: Large (67-100%) Large (67-100%) None Present (0%) Necrotic Tissue: Adherent Slough Eschar, Adherent Slough N/A Exposed Structures: Fascia: No Fascia: No Fascia: No Fat: No Fat: No Fat: No Tendon: No Tendon: No Tendon: No Muscle: No Muscle: No Muscle: No Joint: No Joint: No Joint: No Bone: No Bone: No Bone: No Limited to Skin Limited to Skin Limited to Skin Breakdown Breakdown Breakdown Epithelialization: None None Large (67-100%) Debridement: N/A Debridement ([id number]) Time-Out Taken: N/A Yes N/A Pain Control: N/A Lidocaine 4% Topical N/A Solution Tissue Debrided: N/A Fibrin/Slough, Skin, N/A Subcutaneous Level: N/A Skin/Subcutaneous N/A Tissue Debridement Area (sq N/A 4 N/A cm): Instrument: N/A Curette N/A Bleeding: N/A Moderate N/A Hemostasis Achieved: N/A Silver Nitrate N/A Procedural Pain: N/A 2 N/A Post Procedural Pain: N/A 0 N/A Debridement Treatment N/A Procedure was tolerated N/A Response: well Post Debridement N/A 1.5x2.5x0.1 N/A Measurements L x W x D (cm) Post Debridement N/A 0.295 N/A Volume: (cm) Post Debridement N/A Unstageable/Unclassified N/A Stage: Periwound Skin Texture: Edema: Yes Edema: Yes Edema: No Excoriation: No Excoriation: No Induration: No Induration: No Callus: No Callus: No Crepitus: No Crepitus: No Fluctuance: No Fluctuance: No Friable: No Friable: No Bardwell, Beverlie ([id number]) Rash: No Rash: No Scarring: No Scarring: No Periwound Skin Moist: Yes Moist: Yes Dry/Scaly: Yes Moisture: Dry/Scaly: Yes Maceration: No Maceration: No Moist:  No Periwound Skin Color: Erythema: Yes Atrophie Blanche: No Atrophie Blanche: No Cyanosis: No Cyanosis: No Ecchymosis: No Ecchymosis: No Erythema: No Erythema: No Hemosiderin Staining: No Hemosiderin Staining: No Mottled: No Mottled: No Pallor: No Pallor: No Rubor: No Rubor: No Erythema Location: Circumferential N/A N/A Erythema Change: N/A N/A N/A Temperature: No Abnormality No Abnormality No Abnormality Tenderness on Yes Yes No Palpation: Wound Preparation: Ulcer Cleansing: Ulcer Cleansing: Ulcer Cleansing: Rinsed/Irrigated with Rinsed/Irrigated with Rinsed/Irrigated with Saline Saline Saline Topical Anesthetic Topical Anesthetic Topical Anesthetic Applied: Other: lidocaine Applied: Other: lidocaine Applied: None 4% 4% Procedures Performed: N/A Debridement N/A Wound Number: Photos: No Photos No Photos No Photos Wound Location: Left Lower Leg Left Metatarsal head first - Left Elbow - Lateral Medial Wounding Event: Gradually Appeared Pressure Injury Trauma Primary Etiology: Pressure Ulcer Pressure Ulcer Skin Tear Comorbid History: Cataracts, Lymphedema, Cataracts, Lymphedema, Cataracts, Lymphedema, Aspiration, Chronic Aspiration, Chronic Aspiration, Chronic Obstructive Pulmonary Obstructive Pulmonary Obstructive Pulmonary Disease (COPD), Disease (COPD), Disease (COPD), Arrhythmia, Congestive Arrhythmia, Congestive Arrhythmia, Congestive Heart Failure, Heart Failure, Heart Failure, Hypertension, Peripheral Hypertension, Peripheral Hypertension, Peripheral Venous Disease, Venous Disease, Venous Disease, Osteoarthritis, Dementia Osteoarthritis, Dementia Osteoarthritis, Dementia Date Acquired: 12/13/2015 12/25/2015 12/23/2015 Weeks of Treatment: Wound  Status: Open Open Open Measurements L x W x D 0.5x3x0.1 0.5x1x0.1 1x1x0.1 (cm) Area (cm) : 1.178 0.393 0.785 Volume (cm) : 0.118 0.039 0.079 % Reduction in Area: 66.70% 37.40% -565.30% Efferson, Katerine  ([id number]) % Reduction in Volume: 66.60% 38.10% -558.30% Classification: Category/Stage II Category/Stage II Full Thickness Without Exposed Support Structures Exudate Amount: Large Small Large Exudate Type: Serous Serous Serosanguineous Exudate Color: amber amber red, brown Foul Odor After No No No Cleansing: Odor Anticipated Due to N/A N/A N/A Product Use: Wound Margin: Indistinct, nonvisible Distinct, outline attached Distinct, outline attached Granulation Amount: Medium (34-66%) Medium (34-66%) Large (67-100%) Granulation Quality: Red Pink, Pale Pink, Pale Necrotic Amount: Small (1-33%) Small (1-33%) None Present (0%) Necrotic Tissue: Adherent Slough Adherent Slough N/A Exposed Structures: Fascia: No Fascia: No Fascia: No Fat: No Fat: No Fat: No Tendon: No Tendon: No Tendon: No Muscle: No Muscle: No Muscle: No Joint: No Joint: No Joint: No Bone: No Bone: No Bone: No Limited to Skin Limited to Skin Limited to Skin Breakdown Breakdown Breakdown Epithelialization: Small (1-33%) Medium (34-66%) Large (67-100%) Debridement: N/A N/A N/A Time-Out Taken: N/A N/A N/A Pain Control: N/A N/A N/A Tissue Debrided: N/A N/A N/A Level: N/A N/A N/A Debridement Area (sq N/A N/A N/A cm): Instrument: N/A N/A N/A Bleeding: N/A N/A N/A Hemostasis Achieved: N/A N/A N/A Procedural Pain: N/A N/A N/A Post Procedural Pain: N/A N/A N/A Debridement Treatment N/A N/A N/A Response: Post Debridement N/A N/A N/A Measurements L x W x D (cm) Post Debridement N/A N/A N/A Volume: (cm) Post Debridement N/A N/A N/A Stage: Periwound Skin Texture: Edema: Yes Edema: No Edema: Yes Excoriation: No Excoriation: No Induration: No Induration: No Miotke, Estefania ([id number]) Callus: No Callus: No Crepitus: No Crepitus: No Fluctuance: No Fluctuance: No Friable: No Friable: No Rash: No Rash: No Scarring: No Scarring: No Periwound Skin Moist: Yes Dry/Scaly: Yes Moist:  Yes Moisture: Maceration: No Maceration: No Maceration: No Dry/Scaly: No Dry/Scaly: No Periwound Skin Color: Atrophie Blanche: No Erythema: Yes Atrophie Blanche: No Cyanosis: No Cyanosis: No Ecchymosis: No Ecchymosis: No Erythema: No Erythema: No Hemosiderin Staining: No Hemosiderin Staining: No Mottled: No Mottled: No Pallor: No Pallor: No Rubor: No Rubor: No Erythema Location: N/A Circumferential N/A Erythema Change: N/A Decreased N/A Temperature: No Abnormality No Abnormality No Abnormality Tenderness on Yes No Yes Palpation: Wound Preparation: Ulcer Cleansing: Ulcer Cleansing: Ulcer Cleansing: Rinsed/Irrigated with Rinsed/Irrigated with Rinsed/Irrigated with Saline Saline Saline Topical Anesthetic Topical Anesthetic Topical Anesthetic Applied: Other: lidocaine Applied: Other: lidocaine Applied: Other: lidocaine 4% 4% 4% Procedures Performed: N/A N/A N/A Wound Number: [id number] N/A Photos: No Photos No Photos N/A Wound Location: Left Gluteus - Medial Right Calf - Posterior N/A Wounding Event: Pressure Injury Pressure Injury N/A Primary Etiology: Pressure Ulcer Pressure Ulcer N/A Comorbid History: Cataracts, Lymphedema, Cataracts, Lymphedema, N/A Aspiration, Chronic Aspiration, Chronic Obstructive Pulmonary Obstructive Pulmonary Disease (COPD), Disease (COPD), Arrhythmia, Congestive Arrhythmia, Congestive Heart Failure, Heart Failure, Hypertension, Peripheral Hypertension, Peripheral Venous Disease, Venous Disease, Osteoarthritis, Dementia Osteoarthritis, Dementia Date Acquired: 12/11/2015 12/13/2015 N/A Weeks of Treatment: 1 1 N/A Wound Status: Open Open N/A Measurements L x W x D 0.5x2x1 0.5x10x0.1 N/A (cm) Thumm, Kenlynn ([id number]) Area (cm) : 0.785 3.927 N/A Volume (cm) : 0.785 0.393 N/A % Reduction in Area: 0.00% -270.50% N/A % Reduction in Volume: -893.70% -270.80% N/A Classification: Category/Stage II Category/Stage II N/A Exudate Amount: Medium  Small N/A Exudate Type: Serosanguineous Serous N/A Exudate Color: red, brown amber N/A Foul Odor After No No N/A Cleansing: Odor Anticipated  Due to N/A N/A N/A Product Use: Wound Margin: Distinct, outline attached Distinct, outline attached N/A Granulation Amount: Medium (34-66%) Large (67-100%) N/A Granulation Quality: Pink, Pale Red N/A Necrotic Amount: Medium (34-66%) None Present (0%) N/A Necrotic Tissue: Adherent Slough N/A N/A Exposed Structures: Fascia: No Fascia: No N/A Fat: No Fat: No Tendon: No Tendon: No Muscle: No Muscle: No Joint: No Joint: No Bone: No Bone: No Limited to Skin Limited to Skin Breakdown Breakdown Epithelialization: None Large (67-100%) N/A Debridement: N/A N/A N/A Time-Out Taken: N/A N/A N/A Pain Control: N/A N/A N/A Tissue Debrided: N/A N/A N/A Level: N/A N/A N/A Debridement Area (sq N/A N/A N/A cm): Instrument: N/A N/A N/A Bleeding: N/A N/A N/A Hemostasis Achieved: N/A N/A N/A Procedural Pain: N/A N/A N/A Post Procedural Pain: N/A N/A N/A Debridement Treatment N/A N/A N/A Response: Post Debridement N/A N/A N/A Measurements L x W x D (cm) Post Debridement N/A N/A N/A Volume: (cm) Post Debridement N/A N/A N/A Stage: Periwound Skin Texture: Edema: No Edema: No N/A Excoriation: No Dunkerson, Caniya ([id number]) Induration: No Callus: No Crepitus: No Fluctuance: No Friable: No Rash: No Scarring: No Periwound Skin Moist: Yes Moist: Yes N/A Moisture: Maceration: No Dry/Scaly: No Periwound Skin Color: Erythema: Yes Atrophie Blanche: No N/A Cyanosis: No Ecchymosis: No Erythema: No Hemosiderin Staining: No Mottled: No Pallor: No Rubor: No Erythema Location: Circumferential N/A N/A Erythema Change: N/A N/A N/A Temperature: No Abnormality No Abnormality N/A Tenderness on No No N/A Palpation: Wound Preparation: Ulcer Cleansing: Ulcer Cleansing: N/A Rinsed/Irrigated with Rinsed/Irrigated with Saline Saline Topical  Anesthetic Topical Anesthetic Applied: Other: Lidocaine Applied: None 4% Procedures Performed: N/A N/A N/A Treatment Notes Electronic Signature(s) Signed: 01/01/2016 3:39:38 PM By: Elpidio Eric BSN, RN Entered By: Elpidio Eric on 01/01/2016 10:54:18 [name] ([id number]) -------------------------------------------------------------------------------- Multi-Disciplinary Care Plan Details Patient Name: [name] Date of Service: 01/01/2016 10:00 AM Medical Record Number: [id number] Patient Account Number: [id number] Date of Birth/Sex: 09-30-1928 (82 y.o. Female) Treating RN: Afful, RN, BSN, [place] Sink Primary Care Physician: SYSTEM, PCP Other Clinician: Referring Physician: Treating Physician/Extender: Rudene Re in Treatment: 4 Active Inactive Abuse / Safety / Falls / Self Care Management Nursing Diagnoses: Potential for falls Goals: Patient will remain injury free Date Initiated: 12/04/2015 Goal Status: Active Interventions: Assess fall risk on admission and as needed Notes: Nutrition Nursing Diagnoses: Potential for alteratiion in Nutrition/Potential for imbalanced nutrition Goals: Patient/caregiver agrees to and verbalizes understanding of need to obtain nutritional consultation Date Initiated: 12/04/2015 Goal Status: Active Interventions: Provide education on nutrition Notes: Orientation to the Wound Care Program Nursing Diagnoses: Knowledge deficit related to the wound healing center program Goals: Patient/caregiver will verbalize understanding of the Wound Healing Center Program Date Initiated: 12/04/2015 BENELLI, WINTHER ([id number]) Goal Status: Active Interventions: Provide education on orientation to the wound center Notes: Venous Leg Ulcer Nursing Diagnoses: Actual venous Insuffiency (use after diagnosis is confirmed) Goals: Verify adequate tissue perfusion prior to therapeutic compression application Date Initiated: 12/04/2015 Goal Status:  Active Interventions: Provide education on venous insufficiency Notes: Wound/Skin Impairment Nursing Diagnoses: Impaired tissue integrity Goals: Ulcer/skin breakdown will heal within 14 weeks Date Initiated: 12/04/2015 Goal Status: Active Interventions: Assess patient/caregiver ability to obtain necessary supplies Notes: Electronic Signature(s) Signed: 01/01/2016 3:39:38 PM By: Elpidio Eric BSN, RN Entered By: Elpidio Eric on 01/01/2016 10:53:59 [name] ([id number]) -------------------------------------------------------------------------------- Pain Assessment Details Patient Name: [name] Date of Service: 01/01/2016 10:00 AM Medical Record Number: [id number] Patient Account Number: [id number] Date of Birth/Sex: [birth year] (82 y.o. Female) Treating RN: Afful, RN,  BSN, Rita Primary Care Physician: SYSTEM, PCP Other Clinician: Referring Physician: Treating Physician/Extender: Rudene ReBritto, Errol Weeks in Treatment: 4 Active Problems Location of Pain Severity and Description of Pain Patient Has Paino No Site Locations With Dressing Change: No Pain Management and Medication Current Pain Management: [place] Signature(s) Signed: 01/01/2016 10:07:44 AM By: Elpidio EricAfful, Rita BSN, RN Entered By: Elpidio EricAfful, Rita on 01/01/2016 10:07:44 Elray BubaJOBE, Lanisha ([id number]) -------------------------------------------------------------------------------- Patient/Caregiver Education Details Patient Name: Elray BubaJOBE, Sussan Date of Service: 01/01/2016 10:00 AM Medical Record Number: [id number] Patient Account Number: [id number] Date of Birth/Gender: Sep 27, 1928 (82 y.o. Female) Treating RN: Clover MealyAfful, RN, BSN, [place] Primary Care Physician: SYSTEM, PCP Other Clinician: Referring Physician: Treating Physician/Extender: Rudene ReBritto, Errol Weeks in Treatment: 4 Education Assessment Education Provided To: Patient and Caregiver Education Topics Provided Nutrition: Methods: Explain/Verbal Responses: State content  correctly Venous: Methods: Explain/Verbal Responses: State content correctly Welcome To The W[facility] Center: Methods: Explain/Verbal Electronic Signature(s) Signed: 01/01/2016 3:39:38 PM By: Elpidio EricAfful, Rita BSN, RN Entered By: Elpidio EricAfful, Rita on 01/01/2016 12:58:25 Elray BubaJOBE, Kaja ([id number]) -------------------------------------------------------------------------------- Wound Assessment Details Patient Name: Elray BubaJOBE, Julie-Anne Date of Service: 01/01/2016 10:00 AM Medical Record Number: [id number] Patient Account Number: [id number] Date of Birth/Sex: Sep 27, 1928 (82 y.o. Female) Treating RN: Afful, RN, BSN, [employer] Primary Care Physician: SYSTEM, PCP Other Clinician: Referring Physician: Treating Physician/Extender: Rudene ReBritto, Errol Weeks in Treatment: 4 Wound Status Wound Number: 1 Primary Infection - not elsewhere classified Etiology: Wound Location: Right Elbow Wound Open Wounding Event: Gradually Appeared Status: Date Acquired: 11/14/2015 Comorbid Cataracts, Lymphedema, Aspiration, Weeks Of Treatment: 4 History: Chronic Obstructive Pulmonary Disease Clustered Wound: No (COPD), Arrhythmia, Congestive Heart Failure, Hypertension, Peripheral Venous Disease, Osteoarthritis, Dementia Photos Photo Uploaded By: Elpidio EricAfful, Rita on 01/01/2016 14:59:48 Wound Measurements Length: (cm) 3.4 Width: (cm) 3.4 Depth: (cm) 0.7 Area: (cm) 9.079 Volume: (cm) 6.355 % Reduction in Area: 17.4% % Reduction in Volume: 66% Epithelialization: None Tunneling: No Undermining: No Wound Description Full Thickness Without Exposed Foul Odor Af Classification: Support Structures Due to Produ Wound Margin: Flat and Intact Exudate Large Amount: Exudate Type: Serosanguineous Exudate Color: red, brown ter Cleansing: Yes ct Use: No Wound Bed Shell, Hanako ([id number]) Granulation Amount: None Present (0%) Exposed Structure Necrotic Amount: Large (67-100%) Fascia Exposed: No Necrotic Quality: Adherent Slough Fat  Layer Exposed: No Tendon Exposed: No Muscle Exposed: No Joint Exposed: No Bone Exposed: No Limited to Skin Breakdown Periwound Skin Texture Texture Color No Abnormalities Noted: No No Abnormalities Noted: No Localized Edema: Yes Erythema: Yes Erythema Location: Circumferential Moisture No Abnormalities Noted: No Temperature / Pain Moist: Yes Temperature: No Abnormality Tenderness on Palpation: Yes Wound Preparation Ulcer Cleansing: Rinsed/Irrigated with Saline Topical Anesthetic Applied: Other: lidocaine 4%, Treatment Notes Wound #1 (Right Elbow) 1. Cleansed with: Clean wound with Normal Saline 4. Dressing Applied: Santyl Ointment 5. Secondary Dressing Applied Bordered Foam Dressing Dry Gauze Electronic Signature(s) Signed: 01/01/2016 3:39:38 PM By: Elpidio EricAfful, Rita BSN, RN Entered By: Elpidio EricAfful, Rita on 01/01/2016 10:36:00 Elray BubaJOBE, Tiphanie ([id number]) -------------------------------------------------------------------------------- Wound Assessment Details Patient Name: Elray BubaJOBE, Symia Date of Service: 01/01/2016 10:00 AM Medical Record Number: [id number] Patient Account Number: [id number] Date of Birth/Sex: Sep 27, 1928 (82 y.o. Female) Treating RN: Afful, RN, BSN, [place] Primary Care Physician: SYSTEM, PCP Other Clinician: Referring Physician: Treating Physician/Extender: Rudene ReBritto, Errol Weeks in Treatment: 4 Wound Status Wound Number: 2 Primary Pressure Ulcer Etiology: Wound Location: Left Calcaneus Wound Open Wounding Event: Gradually Appeared Status: Date Acquired: 10/30/2015 Comorbid Cataracts, Lymphedema, Aspiration, Weeks Of Treatment: 3 History: Chronic Obstructive Pulmonary Disease Clustered Wound: No (COPD), Arrhythmia, Congestive Heart Failure, Hypertension, Peripheral Venous  Disease, Osteoarthritis, Dementia Photos Photo Uploaded By: Elpidio EricAfful, Rita on 01/01/2016 14:59:48 Wound Measurements Length: (cm) 1.5 Width: (cm) 2.5 Depth: (cm) 0.1 Area: (cm) 2.945 Volume:  (cm) 0.295 % Reduction in Area: 37.5% % Reduction in Volume: 37.4% Epithelialization: None Tunneling: No Undermining: No Wound Description Classification: Unstageable/Unclassified Wound Margin: Flat and Intact Exudate Amount: Small Exudate Type: Serosanguineous Exudate Color: red, brown Foul Odor After Cleansing: No Wound Bed Granulation Amount: None Present (0%) Exposed Structure Necrotic Amount: Large (67-100%) Fascia Exposed: No Linarez, Norena ([id number]) Necrotic Quality: Eschar, Adherent Slough Fat Layer Exposed: No Tendon Exposed: No Muscle Exposed: No Joint Exposed: No Bone Exposed: No Limited to Skin Breakdown Periwound Skin Texture Texture Color No Abnormalities Noted: No No Abnormalities Noted: No Callus: No Atrophie Blanche: No Crepitus: No Cyanosis: No Excoriation: No Ecchymosis: No Fluctuance: No Erythema: No Friable: No Hemosiderin Staining: No Induration: No Mottled: No Localized Edema: Yes Pallor: No Rash: No Rubor: No Scarring: No Temperature / Pain Moisture Temperature: No Abnormality No Abnormalities Noted: No Tenderness on Palpation: Yes Dry / Scaly: Yes Maceration: No Moist: Yes Wound Preparation Ulcer Cleansing: Rinsed/Irrigated with Saline Topical Anesthetic Applied: Other: lidocaine 4%, Treatment Notes Wound #2 (Left Calcaneus) 1. Cleansed with: Clean wound with Normal Saline 4. Dressing Applied: Santyl Ointment 5. Secondary Dressing Applied Bordered Foam Dressing Dry Gauze Electronic Signature(s) Signed: 01/01/2016 3:39:38 PM By: Elpidio EricAfful, Rita BSN, RN Entered By: Elpidio EricAfful, Rita on 01/01/2016 10:36:47 Elray BubaJOBE, Lorna ([id number]) -------------------------------------------------------------------------------- Wound Assessment Details Patient Name: Elray BubaJOBE, Shanequia Date of Service: 01/01/2016 10:00 AM Medical Record Number: [id number] Patient Account Number: [id number] Date of Birth/Sex: 1928-08-19 (82 y.o. Female) Treating RN: Afful, RN,  BSN, Rita Primary Care Physician: SYSTEM, PCP Other Clinician: Referring Physician: Treating Physician/Extender: Rudene ReBritto, Errol Weeks in Treatment: 4 Wound Status Wound Number: 4 Primary Trauma, Other Etiology: Wound Location: Right Forearm Wound Healed - Epithelialized Wounding Event: Trauma Status: Date Acquired: 12/07/2015 Comorbid Cataracts, Lymphedema, Aspiration, Weeks Of Treatment: 3 History: Chronic Obstructive Pulmonary Disease Clustered Wound: No (COPD), Arrhythmia, Congestive Heart Failure, Hypertension, Peripheral Venous Disease, Osteoarthritis, Dementia Photos Photo Uploaded By: Elpidio EricAfful, Rita on 01/01/2016 15:00:36 Wound Measurements Length: (cm) 0 % Reductio Width: (cm) 0 % Reductio Depth: (cm) 0 Epithelial Area: (cm) 0 Tunneling Volume: (cm) 0 Undermini n in Area: 100% n in Volume: 100% ization: Large (67-100%) : No ng: No Wound Description Classification: Partial Thickness Wound Margin: Distinct, outline attached Exudate Amount: None Present Foul Odor After Cleansing: No Wound Bed Granulation Amount: None Present (0%) Exposed Structure Necrotic Amount: None Present (0%) Fascia Exposed: No Fat Layer Exposed: No Tendon Exposed: No Lovern, Farhiya ([id number]) Muscle Exposed: No Joint Exposed: No Bone Exposed: No Limited to Skin Breakdown Periwound Skin Texture Texture Color No Abnormalities Noted: No No Abnormalities Noted: No Callus: No Atrophie Blanche: No Crepitus: No Cyanosis: No Excoriation: No Ecchymosis: No Fluctuance: No Erythema: No Friable: No Hemosiderin Staining: No Induration: No Mottled: No Localized Edema: No Pallor: No Rash: No Rubor: No Scarring: No Temperature / Pain Moisture Temperature: No Abnormality No Abnormalities Noted: No Dry / Scaly: Yes Maceration: No Moist: No Wound Preparation Ulcer Cleansing: Rinsed/Irrigated with Saline Topical Anesthetic Applied: None Electronic Signature(s) Signed: 01/01/2016  3:39:38 PM By: Elpidio EricAfful, Rita BSN, RN Entered By: Elpidio EricAfful, Rita on 01/01/2016 10:37:07 Elray BubaJOBE, Kawanda ([id number]) -------------------------------------------------------------------------------- Wound Assessment Details Patient Name: Elray BubaJOBE, Keliyah Date of Service: 01/01/2016 10:00 AM Medical Record Number: [id number] Patient Account Number: [id number] Date of Birth/Sex: 1928-08-19 (82 y.o. Female) Treating RN: Afful, RN, BSN, [facility] Primary Care Physician:  SYSTEM, PCP Other Clinician: Referring Physician: Treating Physician/Extender: Rudene Re in Treatment: 4 Wound Status Wound Number: 5 Primary Pressure Ulcer Etiology: Wound Location: Left Lower Leg Wound Open Wounding Event: Gradually Appeared Status: Date Acquired: 12/13/2015 Comorbid Cataracts, Lymphedema, Aspiration, Weeks Of Treatment: 2 History: Chronic Obstructive Pulmonary Disease Clustered Wound: No (COPD), Arrhythmia, Congestive Heart Failure, Hypertension, Peripheral Venous Disease, Osteoarthritis, Dementia Wound Measurements Length: (cm) 0.5 Width: (cm) 3 Depth: (cm) 0.1 Area: (cm) 1.178 Volume: (cm) 0.118 % Reduction in Area: 66.7% % Reduction in Volume: 66.6% Epithelialization: Small (1-33%) Tunneling: No Undermining: No Wound Description Classification: Category/Stage II Wound Margin: Indistinct, nonvisible Exudate Amount: Large Exudate Type: Serous Exudate Color: amber Foul Odor After Cleansing: No Wound Bed Granulation Amount: Medium (34-66%) Exposed Structure Granulation Quality: Red Fascia Exposed: No Necrotic Amount: Small (1-33%) Fat Layer Exposed: No Necrotic Quality: Adherent Slough Tendon Exposed: No Muscle Exposed: No Joint Exposed: No Bone Exposed: No Limited to Skin Breakdown Periwound Skin Texture Texture Color No Abnormalities Noted: No No Abnormalities Noted: No Callus: No Atrophie Blanche: No Harmes, Prapti ([id number]) Crepitus: No Cyanosis: No Excoriation:  No Ecchymosis: No Fluctuance: No Erythema: No Friable: No Hemosiderin Staining: No Induration: No Mottled: No Localized Edema: Yes Pallor: No Rash: No Rubor: No Scarring: No Temperature / Pain Moisture Temperature: No Abnormality No Abnormalities Noted: No Tenderness on Palpation: Yes Dry / Scaly: No Maceration: No Moist: Yes Wound Preparation Ulcer Cleansing: Rinsed/Irrigated with Saline Topical Anesthetic Applied: Other: lidocaine 4%, Treatment Notes Wound #5 (Left Lower Leg) 1. Cleansed with: Clean wound with Normal Saline 5. Secondary Dressing Applied Bordered Foam Dressing Electronic Signature(s) Signed: 01/01/2016 3:39:38 PM By: Elpidio Eric BSN, RN Entered By: Elpidio Eric on 01/01/2016 10:39:26 [name] ([id number]) -------------------------------------------------------------------------------- Wound Assessment Details Patient Name: [name] Date of Service: 01/01/2016 10:00 AM Medical Record Number: [id number] Patient Account Number: [id number] Date of Birth/Sex: 05-09-1929 (82 y.o. Female) Treating RN: Afful, RN, BSN, [employer] Primary Care Physician: SYSTEM, PCP Other Clinician: Referring Physician: Treating Physician/Extender: Rudene Re in Treatment: 4 Wound Status Wound Number: 6 Primary Pressure Ulcer Etiology: Wound Location: Left Metatarsal head first - Medial Wound Open Status: Wounding Event: Pressure Injury Comorbid Cataracts, Lymphedema, Aspiration, Date Acquired: 12/25/2015 History: Chronic Obstructive Pulmonary Disease Weeks Of Treatment: 1 (COPD), Arrhythmia, Congestive Heart Clustered Wound: No Failure, Hypertension, Peripheral Venous Disease, Osteoarthritis, Dementia Photos Photo Uploaded By: Elpidio Eric on 01/01/2016 15:00:36 Wound Measurements Length: (cm) 0.5 Width: (cm) 1 Depth: (cm) 0.1 Area: (cm) 0.393 Volume: (cm) 0.039 % Reduction in Area: 37.4% % Reduction in Volume: 38.1% Epithelialization: Medium  (34-66%) Tunneling: No Undermining: No Wound Description Classification: Category/Stage II Wound Margin: Distinct, outline attached Exudate Amount: Small Exudate Type: Serous Exudate Color: amber Foul Odor After Cleansing: No Wound Bed Granulation Amount: Medium (34-66%) Exposed Structure Granulation Quality: Pink, Pale Fascia Exposed: No Thomley, Keylah ([id number]) Necrotic Amount: Small (1-33%) Fat Layer Exposed: No Necrotic Quality: Adherent Slough Tendon Exposed: No Muscle Exposed: No Joint Exposed: No Bone Exposed: No Limited to Skin Breakdown Periwound Skin Texture Texture Color No Abnormalities Noted: No No Abnormalities Noted: No Localized Edema: No Erythema: Yes Erythema Location: Circumferential Moisture Erythema Change: Decreased No Abnormalities Noted: No Dry / Scaly: Yes Temperature / Pain Maceration: No Temperature: No Abnormality Wound Preparation Ulcer Cleansing: Rinsed/Irrigated with Saline Topical Anesthetic Applied: Other: lidocaine 4%, Treatment Notes Wound #6 (Left, Medial Metatarsal head first) 1. Cleansed with: Clean wound with Normal Saline 5. Secondary Dressing Applied Bordered Foam Dressing Electronic Signature(s) Signed: 01/01/2016 3:39:38  PM By: Elpidio Eric BSN, RN Entered By: Elpidio Eric on 01/01/2016 10:40:54 [name] ([id number]) -------------------------------------------------------------------------------- Wound Assessment Details Patient Name: [name] Date of Service: 01/01/2016 10:00 AM Medical Record Number: [id number] Patient Account Number: [id number] Date of Birth/Sex: 1928-11-18 (82 y.o. Female) Treating RN: Afful, RN, BSN, Rita Primary Care Physician: SYSTEM, PCP Other Clinician: Referring Physician: Treating Physician/Extender: Rudene Re in Treatment: 4 Wound Status Wound Number: 7 Primary Skin Tear Etiology: Wound Location: Left Elbow - Lateral Wound Open Wounding Event: Trauma Status: Date  Acquired: 12/23/2015 Comorbid Cataracts, Lymphedema, Aspiration, Weeks Of Treatment: 1 History: Chronic Obstructive Pulmonary Disease Clustered Wound: No (COPD), Arrhythmia, Congestive Heart Failure, Hypertension, Peripheral Venous Disease, Osteoarthritis, Dementia Photos Photo Uploaded By: Elpidio Eric on 01/01/2016 15:04:30 Wound Measurements Length: (cm) 1 Width: (cm) 1 Depth: (cm) 0.1 Area: (cm) 0.785 Volume: (cm) 0.079 % Reduction in Area: -565.3% % Reduction in Volume: -558.3% Epithelialization: Large (67-100%) Tunneling: No Undermining: No Wound Description Full Thickness Without Exposed Classification: Support Structures Wound Margin: Distinct, outline attached Exudate Large Amount: Exudate Type: Serosanguineous Exudate Color: red, brown Foul Odor After Cleansing: No Wound Bed Zawislak, Ameirah ([id number]) Granulation Amount: Large (67-100%) Exposed Structure Granulation Quality: Pink, Pale Fascia Exposed: No Necrotic Amount: None Present (0%) Fat Layer Exposed: No Tendon Exposed: No Muscle Exposed: No Joint Exposed: No Bone Exposed: No Limited to Skin Breakdown Periwound Skin Texture Texture Color No Abnormalities Noted: No No Abnormalities Noted: No Callus: No Atrophie Blanche: No Crepitus: No Cyanosis: No Excoriation: No Ecchymosis: No Fluctuance: No Erythema: No Friable: No Hemosiderin Staining: No Induration: No Mottled: No Localized Edema: Yes Pallor: No Rash: No Rubor: No Scarring: No Temperature / Pain Moisture Temperature: No Abnormality No Abnormalities Noted: No Tenderness on Palpation: Yes Dry / Scaly: No Maceration: No Moist: Yes Wound Preparation Ulcer Cleansing: Rinsed/Irrigated with Saline Topical Anesthetic Applied: Other: lidocaine 4%, Treatment Notes Wound #7 (Left, Lateral Elbow) 1. Cleansed with: Clean wound with Normal Saline 5. Secondary Dressing Applied Bordered Foam Dressing Electronic Signature(s) Signed:  01/01/2016 3:39:38 PM By: Elpidio Eric BSN, RN Entered By: Elpidio Eric on 01/01/2016 10:41:22 [name] ([id number]) -------------------------------------------------------------------------------- Wound Assessment Details Patient Name: [name] Date of Service: 01/01/2016 10:00 AM Medical Record Number: [id number] Patient Account Number: [id number] Date of Birth/Sex: 1929/02/10 (82 y.o. Female) Treating RN: Afful, RN, BSN, Rita Primary Care Physician: SYSTEM, PCP Other Clinician: Referring Physician: Treating Physician/Extender: Rudene Re in Treatment: 4 Wound Status Wound Number: 8 Primary Pressure Ulcer Etiology: Wound Location: Left Gluteus - Medial Wound Open Wounding Event: Pressure Injury Status: Date Acquired: 12/11/2015 Comorbid Cataracts, Lymphedema, Aspiration, Weeks Of Treatment: 1 History: Chronic Obstructive Pulmonary Disease Clustered Wound: No (COPD), Arrhythmia, Congestive Heart Failure, Hypertension, Peripheral Venous Disease, Osteoarthritis, Dementia Photos Photo Uploaded By: Elpidio Eric on 01/01/2016 15:04:31 Wound Measurements Length: (cm) 0.5 Width: (cm) 2 Depth: (cm) 1 Area: (cm) 0.785 Volume: (cm) 0.785 % Reduction in Area: 0% % Reduction in Volume: -893.7% Epithelialization: None Tunneling: No Undermining: No Wound Description Classification: Category/Stage II Wound Margin: Distinct, outline attached Minehart, Yliana ([id number]) Foul Odor After Cleansing: No Exudate Amount: Medium Exudate Type: Serosanguineous Exudate Color: red, brown Wound Bed Granulation Amount: Medium (34-66%) Exposed Structure Granulation Quality: Pink, Pale Fascia Exposed: No Necrotic Amount: Medium (34-66%) Fat Layer Exposed: No Necrotic Quality: Adherent Slough Tendon Exposed: No Muscle Exposed: No Joint Exposed: No Bone Exposed: No Limited to Skin Breakdown Periwound Skin Texture Texture Color No Abnormalities Noted: No No Abnormalities Noted:  No Localized Edema: No Erythema:  Yes Erythema Location: Circumferential Moisture No Abnormalities Noted: No Temperature / Pain Moist: Yes Temperature: No Abnormality Wound Preparation Ulcer Cleansing: Rinsed/Irrigated with Saline Topical Anesthetic Applied: Other: Lidocaine 4%, Treatment Notes Wound #8 (Left, Medial Gluteus) 1. Cleansed with: Clean wound with Normal Saline 5. Secondary Dressing Applied Bordered Foam Dressing Electronic Signature(s) Signed: 01/01/2016 3:39:38 PM By: Elpidio Eric BSN, RN Entered By: Elpidio Eric on 01/01/2016 10:41:48 [name] ([id number]) -------------------------------------------------------------------------------- Wound Assessment Details Patient Name: [name] Date of Service: 01/01/2016 10:00 AM Medical Record Number: [id number] Patient Account Number: [id number] Date of Birth/Sex: 1928-09-18 (82 y.o. Female) Treating RN: Afful, RN, BSN, [employer] Primary Care Physician: SYSTEM, PCP Other Clinician: Referring Physician: Treating Physician/Extender: Rudene Re in Treatment: 4 Wound Status Wound Number: 9 Primary Pressure Ulcer Etiology: Wound Location: Right Calf - Posterior Wound Open Wounding Event: Pressure Injury Status: Date Acquired: 12/13/2015 Comorbid Cataracts, Lymphedema, Aspiration, Weeks Of Treatment: 1 History: Chronic Obstructive Pulmonary Disease Clustered Wound: No (COPD), Arrhythmia, Congestive Heart Failure, Hypertension, Peripheral Venous Disease, Osteoarthritis, Dementia Photos Photo Uploaded By: Elpidio Eric on 01/01/2016 15:05:21 Wound Measurements Length: (cm) 0.5 Width: (cm) 10 Depth: (cm) 0.1 Area: (cm) 3.927 Volume: (cm) 0.393 % Reduction in Area: -270.5% % Reduction in Volume: -270.8% Epithelialization: Large (67-100%) Tunneling: No Undermining: No Wound Description Classification: Category/Stage II Wound Margin: Distinct, outline attached Raffield, Khloi ([id number]) Foul Odor After  Cleansing: No Exudate Amount: Small Exudate Type: Serous Exudate Color: amber Wound Bed Granulation Amount: Large (67-100%) Exposed Structure Granulation Quality: Red Fascia Exposed: No Necrotic Amount: None Present (0%) Fat Layer Exposed: No Tendon Exposed: No Muscle Exposed: No Joint Exposed: No Bone Exposed: No Limited to Skin Breakdown Periwound Skin Texture Texture Color No Abnormalities Noted: No No Abnormalities Noted: No Callus: No Atrophie Blanche: No Crepitus: No Cyanosis: No Excoriation: No Ecchymosis: No Fluctuance: No Erythema: No Friable: No Hemosiderin Staining: No Induration: No Mottled: No Localized Edema: No Pallor: No Rash: No Rubor: No Scarring: No Temperature / Pain Moisture Temperature: No Abnormality No Abnormalities Noted: No Dry / Scaly: No Maceration: No Moist: Yes Wound Preparation Ulcer Cleansing: Rinsed/Irrigated with Saline Topical Anesthetic Applied: None Treatment Notes Wound #9 (Right, Posterior Calf) 1. Cleansed with: Clean wound with Normal Saline 5. Secondary Dressing Applied Bordered Foam Dressing Electronic Signature(s) Signed: 01/01/2016 3:39:38 PM By: Elpidio Eric BSN, RN Entered By: Elpidio Eric on 01/01/2016 10:42:14 KHRYSTAL, JEANMARIE ([id number]) BRINAE, WOODS ([id number]) -------------------------------------------------------------------------------- Vitals Details Patient Name: [name] Date of Service: 01/01/2016 10:00 AM Medical Record Number: [id number] Patient Account Number: [id number] Date of Birth/Sex: Aug 04, 1928 (82 y.o. Female) Treating RN: Afful, RN, BSN, Rita Primary Care Physician: SYSTEM, PCP Other Clinician: Referring Physician: Treating Physician/Extender: Rudene Re in Treatment: 4 Vital Signs Time Taken: 10:12 Temperature (F): 98.1 Height (in): 64 Pulse (bpm): 79 Weight (lbs): 153 Respiratory Rate (breaths/min): 17 Body Mass Index (BMI): 26.3 Blood Pressure (mmHg):  105/57 Reference Range: 80 - 120 mg / dl Electronic Signature(s) Signed: 01/01/2016 3:39:38 PM By: Elpidio Eric BSN, RN Entered By: Elpidio Eric on 01/01/2016 10:13:10

## 2016-01-01 NOTE — Progress Notes (Addendum)
RENU, ASBY ([id number]) Visit Report for 01/01/2016 Chief Complaint Document Details Patient Name: [name], [name] Date of Service: 01/01/2016 10:00 AM Medical Record Patient Account Number: [id number] Number: Afful, RN, BSN, Treating RN: 08-Feb-1929 (82 y.o. [place] Sink Date of Birth/Sex: Female) Other Clinician: Primary Care Physician: SYSTEM, PCP Treating Grainne Knights Referring Physician: Physician/Extender: Tania Ade in Treatment: 4 Information Obtained from: Patient Chief Complaint Patient seen for complaints of Non-Healing Wound to the right upper arm laterally near the elbow for about a month. She also has some skin changes on her left heel. Electronic Signature(s) Signed: 01/01/2016 10:44:04 AM By: Evlyn Kanner MD, FACS Entered By: Evlyn Kanner on 01/01/2016 10:44:04 [name] ([id number]) -------------------------------------------------------------------------------- Debridement Details Patient Name: [name] Date of Service: 01/01/2016 10:00 AM Medical Record Patient Account Number: [id number] Number: Afful, RN, BSN, Treating RN: [birth year] (82 y.o. [place] Sink Date of Birth/Sex: Female) Other Clinician: Primary Care Physician: SYSTEM, PCP Treating Demere Dotzler Referring Physician: Physician/Extender: Tania Ade in Treatment: 4 Debridement Performed for Wound #2 Left Calcaneus Assessment: Performed By: Physician Evlyn Kanner, MD Debridement: Debridement Pre-procedure Yes Verification/Time Out Taken: Start Time: 10:35 Pain Control: Lidocaine 4% Topical Solution Level: Skin/Subcutaneous Tissue Total Area Debrided (L x 2 (cm) x 2 (cm) = 4 (cm) W): Tissue and other Viable, Non-Viable, Fibrin/Slough, Skin, Subcutaneous material debrided: Instrument: Curette Bleeding: Moderate Hemostasis Achieved: Silver Nitrate End Time: 10:39 Procedural Pain: 2 Post Procedural Pain: 0 Response to Treatment: Procedure was tolerated well Post Debridement Measurements of  Total Wound Length: (cm) 1.5 Stage: Unstageable/Unclassified Width: (cm) 2.5 Depth: (cm) 0.1 Volume: (cm) 0.295 Post Procedure Diagnosis Same as Pre-procedure Electronic Signature(s) Signed: 01/01/2016 10:43:55 AM By: Evlyn Kanner MD, FACS Signed: 01/01/2016 3:39:38 PM By: Elpidio Eric BSN, RN Entered By: Evlyn Kanner on 01/01/2016 10:43:55 [name] ([id number]) -------------------------------------------------------------------------------- HPI Details Patient Name: [name] Date of Service: 01/01/2016 10:00 AM Medical Record Patient Account Number: [id number] Number: Afful, RN, BSN, Treating RN: June 08, 1929 (82 y.o. [place] Sink Date of Birth/Sex: Female) Other Clinician: Primary Care Physician: SYSTEM, PCP Treating Delmar Arriaga Referring Physician: Physician/Extender: Tania Ade in Treatment: 4 History of Present Illness Location: wound on her right arm laterally near the elbow Quality: Patient reports experiencing a dull pain to affected area(s). Severity: Patient states wound are getting worse. Duration: Patient has had the wound for < 4 weeks prior to presenting for treatment Timing: Pain in wound is Intermittent (comes and goes Context: The wound appeared gradually over time Modifying Factors: Other treatment(s) tried include:local care with dressing changes and has recently been on clindamycin Associated Signs and Symptoms: Patient reports having increase swelling all over the body including upper arms and legs. HPI Description: 82 year old patient was been referred to [place] for a ulcerated area on her right elbow. She has a past medical history of CHF, atrial fibrillation, hypertension, gait instability, bilateral lower extremity edema, morbid obesity, altered mental status. She has never been a smoker. her cardiologist is Dr. Julien Nordmann, who last saw her in March 2017.he has been seeing her with a history of hypertension, gait instability, cognitive impairment,  A. fib with RVR and chronic systolic CHF. she is on Eliquis 5 mg twice a day and for the bilateral lower extremity lymphedema he had recommended elevation and compression hose. 12/11/2015 -- x-ray of the right elbow shows no evidence of fracture or bony pathology and the joint spaces are intact. The patient has held her anticoagulation for the last 72 hours. 12/18/2015 -- the patient was seen in the ER for fluid overload  and breathlessness and appropriate management was done including increasing her dosage of Lasix after an appropriate workup. Details of this have been noted from the ER record -- chest x-ray did not reveal any acute abnormality and the labs were reviewed. She was put on Lasix 40 mg twice a day for 3 days and she will be on a steroid taper. She was also advised breathing treatments twice a day and to follow-up with her cardiologist. 12/25/2015 -- she continues to have extensive lower extremity edema and has several new areas on both lower legs plus the left buttock and left elbow. These are all new since last week. Electronic Signature(s) Signed: 01/01/2016 10:44:08 AM By: Evlyn Kanner MD, FACS Entered By: Evlyn Kanner on 01/01/2016 10:44:08 [name] ([id number]) -------------------------------------------------------------------------------- Physical Exam Details Patient Name: [name] Date of Service: 01/01/2016 10:00 AM Medical Record Patient Account Number: [id number] Number: Afful, RN, BSN, Treating RN: May 01, 1929 (82 y.o. [employer] Sink Date of Birth/Sex: Female) Other Clinician: Primary Care Physician: SYSTEM, PCP Treating Maitri Schnoebelen Referring Physician: Physician/Extender: Tania Ade in Treatment: 4 Constitutional . Pulse regular. Respirations normal and unlabored. Afebrile. . Eyes Nonicteric. Reactive to light. Ears, Nose, Mouth, and Throat Lips, teeth, and gums WNL.Marland Kitchen Moist mucosa without lesions. Neck supple and nontender. No palpable supraclavicular  or cervical adenopathy. Normal sized without goiter. Respiratory WNL. No retractions.. Cardiovascular Pedal Pulses WNL. No clubbing, cyanosis or edema. Chest Breasts symmetical and no nipple discharge.. Breast tissue WNL, no masses, lumps, or tenderness.. Lymphatic No adneopathy. No adenopathy. No adenopathy. Musculoskeletal Adexa without tenderness or enlargement.. Digits and nails w/o clubbing, cyanosis, infection, petechiae, ischemia, or inflammatory conditions.. Integumentary (Hair, Skin) No suspicious lesions. No crepitus or fluctuance. No peri-wound warmth or erythema. No masses.Marland Kitchen Psychiatric Judgement and insight Intact.. No evidence of depression, anxiety, or agitation.. Notes the left calcaneal area now has a lot of necrotic debris and with a #3 curet I have removed a lot of necrotic debris which sharp dissection and brisk bleeding controlled with Silver nitrate and pressure. The right elbow continues to have a lot of slough and we will use Santyl ointment here. Rest of the pressure injuries or superficial and we will protected with a bordered foam on the left elbow and the gluteal area. Electronic Signature(s) Signed: 01/01/2016 10:45:10 AM By: Evlyn Kanner MD, FACS Entered By: Evlyn Kanner on 01/01/2016 10:45:10 YENTY, BLOCH ([id number]) SUZY, KUGEL ([id number]) -------------------------------------------------------------------------------- Physician Orders Details Patient Name: [name] Date of Service: 01/01/2016 10:00 AM Medical Record Patient Account Number: [id number] Number: Afful, RN, BSN, Treating RN: 08/21/1928 (82 y.o. [place] Sink Date of Birth/Sex: Female) Other Clinician: Primary Care Physician: SYSTEM, PCP Treating Linah Klapper Referring Physician: Physician/Extender: Tania Ade in Treatment: 4 Verbal / Phone Orders: Yes Clinician: Afful, RN, BSN, Rita Read Back and Verified: Yes Diagnosis Coding ICD-10 Coding Code Description S41.101A  Unspecified open wound of right upper arm, initial encounter L89.629 Pressure ulcer of left heel, unspecified stage I50.40 Unspecified combined systolic (congestive) and diastolic (congestive) heart failure I89.0 Lymphedema, not elsewhere classified L89.322 Pressure ulcer of left buttock, stage 2 S41.102A Unspecified open wound of left upper arm, initial encounter Wound Cleansing Wound #1 Right Elbow o Clean wound with Normal Saline. Wound #2 Left Calcaneus o Clean wound with Normal Saline. Wound #5 Left Lower Leg o Clean wound with Normal Saline. Wound #6 Left,Medial Metatarsal head first o Clean wound with Normal Saline. Wound #7 Left,Lateral Elbow o Clean wound with Normal Saline. Wound #8 Left,Medial Gluteus o Clean wound with Normal Saline.  Wound #9 Right,Posterior Calf o Clean wound with Normal Saline. Anesthetic Wound #1 Right Elbow o Topical Lidocaine 4% cream applied to wound bed prior to debridement - for clinic use Rogalski, Alyssabeth ([id number]) Wound #2 Left Calcaneus o Topical Lidocaine 4% cream applied to wound bed prior to debridement - for clinic use Wound #5 Left Lower Leg o Topical Lidocaine 4% cream applied to wound bed prior to debridement - for clinic use Wound #6 Left,Medial Metatarsal head first o Topical Lidocaine 4% cream applied to wound bed prior to debridement - for clinic use Wound #7 Left,Lateral Elbow o Topical Lidocaine 4% cream applied to wound bed prior to debridement - for clinic use Wound #8 Left,Medial Gluteus o Topical Lidocaine 4% cream applied to wound bed prior to debridement - for clinic use Wound #9 Right,Posterior Calf o Topical Lidocaine 4% cream applied to wound bed prior to debridement - for clinic use Skin Barriers/Peri-Wound Care Wound #1 Right Elbow o Skin Prep Wound #2 Left Calcaneus o Skin Prep Wound #6 Left,Medial Metatarsal head first o Skin Prep Wound #7 Left,Lateral Elbow o Skin  Prep Wound #8 Left,Medial Gluteus o Skin Prep Primary Wound Dressing Wound #1 Right Elbow o Santyl Ointment Wound #2 Left Calcaneus o Santyl Ointment Wound #5 Left Lower Leg o Boardered Foam Dressing Wound #6 Left,Medial Metatarsal head first o Boardered Foam Dressing Wound #7 Left,Lateral Elbow Bartunek, Camrynn ([id number]) o Boardered Foam Dressing Wound #8 Left,Medial Gluteus o Boardered Foam Dressing Secondary Dressing Wound #1 Right Elbow o Boardered Foam Dressing Wound #2 Left Calcaneus o Boardered Foam Dressing Wound #6 Left,Medial Metatarsal head first o Boardered Foam Dressing Wound #7 Left,Lateral Elbow o Boardered Foam Dressing Wound #8 Left,Medial Gluteus o Boardered Foam Dressing Dressing Change Frequency Wound #1 Right Elbow o Change dressing every day. Wound #2 Left Calcaneus o Change dressing every other day. Wound #6 Left,Medial Metatarsal head first o Change dressing every other day. Wound #7 Left,Lateral Elbow o Change dressing every other day. Wound #8 Left,Medial Gluteus o Change dressing every other day. - unless it falls off or becomes soiled Follow-up Appointments Wound #1 Right Elbow o Return Appointment in 1 week. Wound #2 Left Calcaneus o Return Appointment in 1 week. Wound #5 Left Lower Leg o Return Appointment in 1 week. Wound #6 Left,Medial Metatarsal head first Nijjar, Yuridia ([id number]) o Return Appointment in 1 week. Wound #7 Left,Lateral Elbow o Return Appointment in 1 week. Wound #8 Left,Medial Gluteus o Return Appointment in 1 week. Wound #9 Right,Posterior Calf o Return Appointment in 1 week. Edema Control Wound #1 Right Elbow o Elevate legs to the level of the heart and pump ankles as often as possible Wound #2 Left Calcaneus o Elevate legs to the level of the heart and pump ankles as often as possible Wound #5 Left Lower Leg o Elevate legs to the level of the heart and  pump ankles as often as possible Wound #6 Left,Medial Metatarsal head first o Elevate legs to the level of the heart and pump ankles as often as possible Wound #7 Left,Lateral Elbow o Elevate legs to the level of the heart and pump ankles as often as possible Wound #8 Left,Medial Gluteus o Elevate legs to the level of the heart and pump ankles as often as possible Wound #9 Right,Posterior Calf o Elevate legs to the level of the heart and pump ankles as often as possible Off-Loading Wound #1 Right Elbow o Turn and reposition every 2 hours Wound #2 Left Calcaneus o  Turn and reposition every 2 hours Wound #5 Left Lower Leg o Turn and reposition every 2 hours Wound #6 Left,Medial Metatarsal head first o Turn and reposition every 2 hours Wound #7 Left,Lateral Elbow o Turn and reposition every 2 hours Bovey, Geanette ([id number]) Wound #8 Left,Medial Gluteus o Turn and reposition every 2 hours Wound #9 Right,Posterior Calf o Turn and reposition every 2 hours Additional Orders / Instructions Wound #1 Right Elbow o Increase protein intake. Wound #2 Left Calcaneus o Increase protein intake. Wound #5 Left Lower Leg o Increase protein intake. Wound #6 Left,Medial Metatarsal head first o Increase protein intake. Wound #7 Left,Lateral Elbow o Increase protein intake. Wound #8 Left,Medial Gluteus o Increase protein intake. Wound #9 Right,Posterior Calf o Increase protein intake. Home Health Wound #1 Right Elbow o Continue Home Health Visits o Home Health Nurse may visit PRN to address patientos wound care needs. o FACE TO FACE ENCOUNTER: MEDICARE and MEDICAID PATIENTS: I certify that this patient is under my care and that I had a face-to-face encounter that meets the physician face-to-face encounter requirements with this patient on this date. The encounter with the patient was in whole or in part for the following MEDICAL CONDITION: (primary  reason for Home Healthcare) MEDICAL NECESSITY: I certify, that based on my findings, NURSING services are a medically necessary home health service. HOME BOUND STATUS: I certify that my clinical findings support that this patient is homebound (i.e., Due to illness or injury, pt requires aid of supportive devices such as crutches, cane, wheelchairs, walkers, the use of special transportation or the assistance of another person to leave their place of residence. There is a normal inability to leave the home and doing so requires considerable and taxing effort. Other absences are for medical reasons / religious services and are infrequent or of short duration when for other reasons). o If current dressing causes regression in wound condition, may D/C ordered dressing product/s and apply Normal Saline Moist Dressing daily until next Wound Healing Center / Other MD appointment. Notify Wound Healing Center of regression in wound condition at [phone]. Anita BubaJOBE, Elanor ([id number]) o Please direct any NON-WOUND related issues/requests for orders to patient's Primary Care Physician Wound #2 Left Calcaneus o Continue Home Health Visits o Home Health Nurse may visit PRN to address patientos wound care needs. o FACE TO FACE ENCOUNTER: MEDICARE and MEDICAID PATIENTS: I certify that this patient is under my care and that I had a face-to-face encounter that meets the physician face-to-face encounter requirements with this patient on this date. The encounter with the patient was in whole or in part for the following MEDICAL CONDITION: (primary reason for Home Healthcare) MEDICAL NECESSITY: I certify, that based on my findings, NURSING services are a medically necessary home health service. HOME BOUND STATUS: I certify that my clinical findings support that this patient is homebound (i.e., Due to illness or injury, pt requires aid of supportive devices such as crutches, cane, wheelchairs, walkers,  the use of special transportation or the assistance of another person to leave their place of residence. There is a normal inability to leave the home and doing so requires considerable and taxing effort. Other absences are for medical reasons / religious services and are infrequent or of short duration when for other reasons). o If current dressing causes regression in wound condition, may D/C ordered dressing product/s and apply Normal Saline Moist Dressing daily until next Wound Healing Center / Other MD appointment. Notify Wound Healing Center of regression  in wound condition at [phone]. o Please direct any NON-WOUND related issues/requests for orders to patient's Primary Care Physician Wound #5 Left Lower Leg o Continue Home Health Visits o Home Health Nurse may visit PRN to address patientos wound care needs. o FACE TO FACE ENCOUNTER: MEDICARE and MEDICAID PATIENTS: I certify that this patient is under my care and that I had a face-to-face encounter that meets the physician face-to-face encounter requirements with this patient on this date. The encounter with the patient was in whole or in part for the following MEDICAL CONDITION: (primary reason for Home Healthcare) MEDICAL NECESSITY: I certify, that based on my findings, NURSING services are a medically necessary home health service. HOME BOUND STATUS: I certify that my clinical findings support that this patient is homebound (i.e., Due to illness or injury, pt requires aid of supportive devices such as crutches, cane, wheelchairs, walkers, the use of special transportation or the assistance of another person to leave their place of residence. There is a normal inability to leave the home and doing so requires considerable and taxing effort. Other absences are for medical reasons / religious services and are infrequent or of short duration when for other reasons). o If current dressing causes regression in wound  condition, may D/C ordered dressing product/s and apply Normal Saline Moist Dressing daily until next Wound Healing Center / Other MD appointment. Notify Wound Healing Center of regression in wound condition at [phone]. o Please direct any NON-WOUND related issues/requests for orders to patient's Primary Care Physician Wound #6 Left,Medial Metatarsal head first o Continue Home Health Visits o Home Health Nurse may visit PRN to address patientos wound care needs. o FACE TO FACE ENCOUNTER: MEDICARE and MEDICAID PATIENTS: I certify that this patient is under my care and that I had a face-to-face encounter that meets the physician face-to-face WENONAH, MILO ([id number]) encounter requirements with this patient on this date. The encounter with the patient was in whole or in part for the following MEDICAL CONDITION: (primary reason for Home Healthcare) MEDICAL NECESSITY: I certify, that based on my findings, NURSING services are a medically necessary home health service. HOME BOUND STATUS: I certify that my clinical findings support that this patient is homebound (i.e., Due to illness or injury, pt requires aid of supportive devices such as crutches, cane, wheelchairs, walkers, the use of special transportation or the assistance of another person to leave their place of residence. There is a normal inability to leave the home and doing so requires considerable and taxing effort. Other absences are for medical reasons / religious services and are infrequent or of short duration when for other reasons). o If current dressing causes regression in wound condition, may D/C ordered dressing product/s and apply Normal Saline Moist Dressing daily until next Wound Healing Center / Other MD appointment. Notify Wound Healing Center of regression in wound condition at [phone]. o Please direct any NON-WOUND related issues/requests for orders to patient's Primary Care Physician Wound #7  Left,Lateral Elbow o Continue Home Health Visits o Home Health Nurse may visit PRN to address patientos wound care needs. o FACE TO FACE ENCOUNTER: MEDICARE and MEDICAID PATIENTS: I certify that this patient is under my care and that I had a face-to-face encounter that meets the physician face-to-face encounter requirements with this patient on this date. The encounter with the patient was in whole or in part for the following MEDICAL CONDITION: (primary reason for Home Healthcare) MEDICAL NECESSITY: I certify, that based on my findings, NURSING  services are a medically necessary home health service. HOME BOUND STATUS: I certify that my clinical findings support that this patient is homebound (i.e., Due to illness or injury, pt requires aid of supportive devices such as crutches, cane, wheelchairs, walkers, the use of special transportation or the assistance of another person to leave their place of residence. There is a normal inability to leave the home and doing so requires considerable and taxing effort. Other absences are for medical reasons / religious services and are infrequent or of short duration when for other reasons). o If current dressing causes regression in wound condition, may D/C ordered dressing product/s and apply Normal Saline Moist Dressing daily until next Wound Healing Center / Other MD appointment. Notify Wound Healing Center of regression in wound condition at [phone]. o Please direct any NON-WOUND related issues/requests for orders to patient's Primary Care Physician Wound #8 Left,Medial Gluteus o Continue Home Health Visits o Home Health Nurse may visit PRN to address patientos wound care needs. o FACE TO FACE ENCOUNTER: MEDICARE and MEDICAID PATIENTS: I certify that this patient is under my care and that I had a face-to-face encounter that meets the physician face-to-face encounter requirements with this patient on this date. The encounter  with the patient was in whole or in part for the following MEDICAL CONDITION: (primary reason for Home Healthcare) MEDICAL NECESSITY: I certify, that based on my findings, NURSING services are a medically necessary home health service. HOME BOUND STATUS: I certify that my clinical findings support that this patient is homebound (i.e., Due to illness or injury, pt requires aid of supportive devices such as crutches, cane, wheelchairs, walkers, the use of special transportation or the assistance of another person to leave their place of residence. There is a normal inability to leave the home and doing so requires considerable and taxing effort. AVALIE, OCONNOR ([id number]) absences are for medical reasons / religious services and are infrequent or of short duration when for other reasons). o If current dressing causes regression in wound condition, may D/C ordered dressing product/s and apply Normal Saline Moist Dressing daily until next Wound Healing Center / Other MD appointment. Notify Wound Healing Center of regression in wound condition at [phone]. o Please direct any NON-WOUND related issues/requests for orders to patient's Primary Care Physician Wound #9 Right,Posterior Calf o Continue Home Health Visits o Home Health Nurse may visit PRN to address patientos wound care needs. o FACE TO FACE ENCOUNTER: MEDICARE and MEDICAID PATIENTS: I certify that this patient is under my care and that I had a face-to-face encounter that meets the physician face-to-face encounter requirements with this patient on this date. The encounter with the patient was in whole or in part for the following MEDICAL CONDITION: (primary reason for Home Healthcare) MEDICAL NECESSITY: I certify, that based on my findings, NURSING services are a medically necessary home health service. HOME BOUND STATUS: I certify that my clinical findings support that this patient is homebound (i.e., Due to illness or  injury, pt requires aid of supportive devices such as crutches, cane, wheelchairs, walkers, the use of special transportation or the assistance of another person to leave their place of residence. There is a normal inability to leave the home and doing so requires considerable and taxing effort. Other absences are for medical reasons / religious services and are infrequent or of short duration when for other reasons). o If current dressing causes regression in wound condition, may D/C ordered dressing product/s and apply Normal Saline  Moist Dressing daily until next Wound Healing Center / Other MD appointment. Notify Wound Healing Center of regression in wound condition at [phone]. o Please direct any NON-WOUND related issues/requests for orders to patient's Primary Care Physician Electronic Signature(s) Signed: 01/01/2016 3:39:38 PM By: Elpidio Eric BSN, RN Signed: 01/01/2016 4:11:02 PM By: Evlyn Kanner MD, FACS Entered By: Elpidio Eric on 01/01/2016 10:57:07 [name] ([id number]) -------------------------------------------------------------------------------- Problem List Details Patient Name: [name] Date of Service: 01/01/2016 10:00 AM Medical Record Patient Account Number: [id number] Number: Afful, RN, BSN, Treating RN: 1928-11-02 (82 y.o. [place] Date of Birth/Sex: Female) Other Clinician: Primary Care Physician: SYSTEM, PCP Treating Giovanne Nickolson Referring Physician: Physician/Extender: Tania Ade in Treatment: 4 Active Problems ICD-10 Encounter Code Description Active Date Diagnosis S41.101A Unspecified open wound of right upper arm, initial 12/04/2015 Yes encounter L89.629 Pressure ulcer of left heel, unspecified stage 12/04/2015 Yes I50.40 Unspecified combined systolic (congestive) and diastolic 12/04/2015 Yes (congestive) heart failure I89.0 Lymphedema, not elsewhere classified 12/04/2015 Yes L89.322 Pressure ulcer of left buttock, stage 2 12/25/2015  Yes S41.102A Unspecified open wound of left upper arm, initial 12/25/2015 Yes encounter Inactive Problems Resolved Problems Electronic Signature(s) Signed: 01/01/2016 10:42:42 AM By: Evlyn Kanner MD, FACS Entered By: Evlyn Kanner on 01/01/2016 10:42:41 [name] ([id number]) -------------------------------------------------------------------------------- Progress Note Details Patient Name: [name] Date of Service: 01/01/2016 10:00 AM Medical Record Patient Account Number: [id number] Number: Afful, RN, BSN, Treating RN: [birth year] (82 y.o. [place] Date of Birth/Sex: Female) Other Clinician: Primary Care Physician: SYSTEM, PCP Treating Noreene Boreman Referring Physician: Physician/Extender: Tania Ade in Treatment: 4 Subjective Chief Complaint Information obtained from Patient Patient seen for complaints of Non-Healing Wound to the right upper arm laterally near the elbow for about a month. She also has some skin changes on her left heel. History of Present Illness (HPI) The following HPI elements were documented for the patient's wound: Location: wound on her right arm laterally near the elbow Quality: Patient reports experiencing a dull pain to affected area(s). Severity: Patient states wound are getting worse. Duration: Patient has had the wound for < 4 weeks prior to presenting for treatment Timing: Pain in wound is Intermittent (comes and goes Context: The wound appeared gradually over time Modifying Factors: Other treatment(s) tried include:local care with dressing changes and has recently been on clindamycin Associated Signs and Symptoms: Patient reports having increase swelling all over the body including upper arms and legs. 82 year old patient was been referred to [place] for a ulcerated area on her right elbow. She has a past medical history of CHF, atrial fibrillation, hypertension, gait instability, bilateral lower extremity edema, morbid obesity, altered mental  status. She has never been a smoker. her cardiologist is Dr. Julien Nordmann, who last saw her in March 2017.he has been seeing her with a history of hypertension, gait instability, cognitive impairment, A. fib with RVR and chronic systolic CHF. she is on Eliquis 5 mg twice a day and for the bilateral lower extremity lymphedema he had recommended elevation and compression hose. 12/11/2015 -- x-ray of the right elbow shows no evidence of fracture or bony pathology and the joint spaces are intact. The patient has held her anticoagulation for the last 72 hours. 12/18/2015 -- the patient was seen in the ER for fluid overload and breathlessness and appropriate management was done including increasing her dosage of Lasix after an appropriate workup. Details of this have been noted from the ER record -- chest x-ray did not reveal any acute abnormality and the labs were reviewed. She  was put on Lasix 40 mg twice a day for 3 days and she will be on a steroid taper. She was also advised breathing treatments twice a day and to follow-up with her cardiologist. ALINDA, EGOLF ([id number]) 12/25/2015 -- she continues to have extensive lower extremity edema and has several new areas on both lower legs plus the left buttock and left elbow. These are all new since last week. Objective Constitutional Pulse regular. Respirations normal and unlabored. Afebrile. Vitals Time Taken: 10:12 AM, Height: 64 in, Weight: 153 lbs, BMI: 26.3, Temperature: 98.1 F, Pulse: 79 bpm, Respiratory Rate: 17 breaths/min, Blood Pressure: 105/57 mmHg. Eyes Nonicteric. Reactive to light. Ears, Nose, Mouth, and Throat Lips, teeth, and gums WNL.Marland Kitchen Moist mucosa without lesions. Neck supple and nontender. No palpable supraclavicular or cervical adenopathy. Normal sized without goiter. Respiratory WNL. No retractions.. Cardiovascular Pedal Pulses WNL. No clubbing, cyanosis or edema. Chest Breasts symmetical and no nipple discharge..  Breast tissue WNL, no masses, lumps, or tenderness.. Lymphatic No adneopathy. No adenopathy. No adenopathy. Musculoskeletal Adexa without tenderness or enlargement.. Digits and nails w/o clubbing, cyanosis, infection, petechiae, ischemia, or inflammatory conditions.Marland Kitchen Psychiatric Judgement and insight Intact.. No evidence of depression, anxiety, or agitation.. General Notes: the left calcaneal area now has a lot of necrotic debris and with a #3 curet I have removed a lot of necrotic debris which sharp dissection and brisk bleeding controlled with Silver nitrate and pressure. The right elbow continues to have a lot of slough and we will use Santyl ointment here. Rest of the pressure injuries or superficial and we will protected with a bordered foam on the left elbow and the gluteal area. Egger, Katilynn ([id number]) Integumentary (Hair, Skin) No suspicious lesions. No crepitus or fluctuance. No peri-wound warmth or erythema. No masses.. Wound #1 status is Open. Original cause of wound was Gradually Appeared. The wound is located on the Right Elbow. The wound measures 3.4cm length x 3.4cm width x 0.7cm depth; 9.079cm^2 area and 6.355cm^3 volume. The wound is limited to skin breakdown. There is no tunneling or undermining noted. There is a large amount of serosanguineous drainage noted. The wound margin is flat and intact. There is no granulation within the wound bed. There is a large (67-100%) amount of necrotic tissue within the wound bed including Adherent Slough. The periwound skin appearance exhibited: Localized Edema, Moist, Erythema. The surrounding wound skin color is noted with erythema which is circumferential. Periwound temperature was noted as No Abnormality. The periwound has tenderness on palpation. Wound #2 status is Open. Original cause of wound was Gradually Appeared. The wound is located on the Left Calcaneus. The wound measures 1.5cm length x 2.5cm width x 0.1cm depth; 2.945cm^2  area and 0.295cm^3 volume. The wound is limited to skin breakdown. There is no tunneling or undermining noted. There is a small amount of serosanguineous drainage noted. The wound margin is flat and intact. There is no granulation within the wound bed. There is a large (67-100%) amount of necrotic tissue within the wound bed including Eschar and Adherent Slough. The periwound skin appearance exhibited: Localized Edema, Dry/Scaly, Moist. The periwound skin appearance did not exhibit: Callus, Crepitus, Excoriation, Fluctuance, Friable, Induration, Rash, Scarring, Maceration, Atrophie Blanche, Cyanosis, Ecchymosis, Hemosiderin Staining, Mottled, Pallor, Rubor, Erythema. Periwound temperature was noted as No Abnormality. The periwound has tenderness on palpation. Wound #4 status is Healed - Epithelialized. Original cause of wound was Trauma. The wound is located on the Right Forearm. The wound measures 0cm length x 0cm width x 0cm  depth; 0cm^2 area and 0cm^3 volume. The wound is limited to skin breakdown. There is no tunneling or undermining noted. There is a none present amount of drainage noted. The wound margin is distinct with the outline attached to the wound base. There is no granulation within the wound bed. There is no necrotic tissue within the wound bed. The periwound skin appearance exhibited: Dry/Scaly. The periwound skin appearance did not exhibit: Callus, Crepitus, Excoriation, Fluctuance, Friable, Induration, Localized Edema, Rash, Scarring, Maceration, Moist, Atrophie Blanche, Cyanosis, Ecchymosis, Hemosiderin Staining, Mottled, Pallor, Rubor, Erythema. Periwound temperature was noted as No Abnormality. Wound #5 status is Open. Original cause of wound was Gradually Appeared. The wound is located on the Left Lower Leg. The wound measures 0.5cm length x 3cm width x 0.1cm depth; 1.178cm^2 area and 0.118cm^3 volume. The wound is limited to skin breakdown. There is no tunneling or  undermining noted. There is a large amount of serous drainage noted. The wound margin is indistinct and nonvisible. There is medium (34-66%) red granulation within the wound bed. There is a small (1-33%) amount of necrotic tissue within the wound bed including Adherent Slough. The periwound skin appearance exhibited: Localized Edema, Moist. The periwound skin appearance did not exhibit: Callus, Crepitus, Excoriation, Fluctuance, Friable, Induration, Rash, Scarring, Dry/Scaly, Maceration, Atrophie Blanche, Cyanosis, Ecchymosis, Hemosiderin Staining, Mottled, Pallor, Rubor, Erythema. Periwound temperature was noted as No Abnormality. The periwound has tenderness on palpation. Wound #6 status is Open. Original cause of wound was Pressure Injury. The wound is located on the Left,Medial Metatarsal head first. The wound measures 0.5cm length x 1cm width x 0.1cm depth; 0.393cm^2 area and 0.039cm^3 volume. The wound is limited to skin breakdown. There is no tunneling or undermining noted. There is a small amount of serous drainage noted. The wound margin is distinct with the outline attached to the wound base. There is medium (34-66%) pink, pale granulation within the wound bed. There is a small (1-33%) amount of necrotic tissue within the wound bed including Adherent Slough. Hoerner, Bryson ([id number]) The periwound skin appearance exhibited: Dry/Scaly, Erythema. The periwound skin appearance did not exhibit: Localized Edema, Maceration. The surrounding wound skin color is noted with erythema which is circumferential. Periwound temperature was noted as No Abnormality. Wound #7 status is Open. Original cause of wound was Trauma. The wound is located on the Left,Lateral Elbow. The wound measures 1cm length x 1cm width x 0.1cm depth; 0.785cm^2 area and 0.079cm^3 volume. The wound is limited to skin breakdown. There is no tunneling or undermining noted. There is a large amount of serosanguineous drainage  noted. The wound margin is distinct with the outline attached to the wound base. There is large (67-100%) pink, pale granulation within the wound bed. There is no necrotic tissue within the wound bed. The periwound skin appearance exhibited: Localized Edema, Moist. The periwound skin appearance did not exhibit: Callus, Crepitus, Excoriation, Fluctuance, Friable, Induration, Rash, Scarring, Dry/Scaly, Maceration, Atrophie Blanche, Cyanosis, Ecchymosis, Hemosiderin Staining, Mottled, Pallor, Rubor, Erythema. Periwound temperature was noted as No Abnormality. The periwound has tenderness on palpation. Wound #8 status is Open. Original cause of wound was Pressure Injury. The wound is located on the Left,Medial Gluteus. The wound measures 0.5cm length x 2cm width x 1cm depth; 0.785cm^2 area and 0.785cm^3 volume. The wound is limited to skin breakdown. There is no tunneling or undermining noted. There is a medium amount of serosanguineous drainage noted. The wound margin is distinct with the outline attached to the wound base. There is medium (34-66%) pink,  pale granulation within the wound bed. There is a medium (34-66%) amount of necrotic tissue within the wound bed including Adherent Slough. The periwound skin appearance exhibited: Moist, Erythema. The periwound skin appearance did not exhibit: Localized Edema. The surrounding wound skin color is noted with erythema which is circumferential. Periwound temperature was noted as No Abnormality. Wound #9 status is Open. Original cause of wound was Pressure Injury. The wound is located on the Right,Posterior Calf. The wound measures 0.5cm length x 10cm width x 0.1cm depth; 3.927cm^2 area and 0.393cm^3 volume. The wound is limited to skin breakdown. There is no tunneling or undermining noted. There is a small amount of serous drainage noted. The wound margin is distinct with the outline attached to the wound base. There is large (67-100%) red granulation  within the wound bed. There is no necrotic tissue within the wound bed. The periwound skin appearance exhibited: Moist. The periwound skin appearance did not exhibit: Callus, Crepitus, Excoriation, Fluctuance, Friable, Induration, Localized Edema, Rash, Scarring, Dry/Scaly, Maceration, Atrophie Blanche, Cyanosis, Ecchymosis, Hemosiderin Staining, Mottled, Pallor, Rubor, Erythema. Periwound temperature was noted as No Abnormality. Assessment Active Problems ICD-10 S41.101A - Unspecified open wound of right upper arm, initial encounter L89.629 - Pressure ulcer of left heel, unspecified stage I50.40 - Unspecified combined systolic (congestive) and diastolic (congestive) heart failure I89.0 - Lymphedema, not elsewhere classified L89.322 - Pressure ulcer of left buttock, stage 2 S41.102A - Unspecified open wound of left upper arm, initial encounter Mura, Jerzee ([id number]) Procedures Wound #2 Wound #2 is a Pressure Ulcer located on the Left Calcaneus . There was a Skin/Subcutaneous Tissue Debridement ([id number]) debridement with total area of 4 sq cm performed by Evlyn Kanner, MD. with the following instrument(s): Curette to remove Viable and Non-Viable tissue/material including Fibrin/Slough, Skin, and Subcutaneous after achieving pain control using Lidocaine 4% Topical Solution. A time out was conducted prior to the start of the procedure. A Moderate amount of bleeding was controlled with Silver Nitrate. The procedure was tolerated well with a pain level of 2 throughout and a pain level of 0 following the procedure. Post Debridement Measurements: 1.5cm length x 2.5cm width x 0.1cm depth; 0.295cm^3 volume. Post debridement Stage noted as Unstageable/Unclassified. Post procedure Diagnosis Wound #2: Same as Pre-Procedure Plan Wound Cleansing: Wound #1 Right Elbow: Clean wound with Normal Saline. Wound #2 Left Calcaneus: Clean wound with Normal Saline. Wound #5 Left Lower Leg: Clean  wound with Normal Saline. Wound #6 Left,Medial Metatarsal head first: Clean wound with Normal Saline. Wound #7 Left,Lateral Elbow: Clean wound with Normal Saline. Wound #8 Left,Medial Gluteus: Clean wound with Normal Saline. Wound #9 Right,Posterior Calf: Clean wound with Normal Saline. Anesthetic: Wound #1 Right Elbow: Topical Lidocaine 4% cream applied to wound bed prior to debridement - for clinic use Wound #2 Left Calcaneus: Topical Lidocaine 4% cream applied to wound bed prior to debridement - for clinic use Wound #5 Left Lower Leg: Topical Lidocaine 4% cream applied to wound bed prior to debridement - for clinic use Wound #6 Left,Medial Metatarsal head first: Topical Lidocaine 4% cream applied to wound bed prior to debridement - for clinic use Assad, Regnia ([id number]) Wound #7 Left,Lateral Elbow: Topical Lidocaine 4% cream applied to wound bed prior to debridement - for clinic use Wound #8 Left,Medial Gluteus: Topical Lidocaine 4% cream applied to wound bed prior to debridement - for clinic use Wound #9 Right,Posterior Calf: Topical Lidocaine 4% cream applied to wound bed prior to debridement - for clinic use Skin Barriers/Peri-Wound Care: Wound #1  Right Elbow: Skin Prep Wound #2 Left Calcaneus: Skin Prep Wound #6 Left,Medial Metatarsal head first: Skin Prep Wound #7 Left,Lateral Elbow: Skin Prep Wound #8 Left,Medial Gluteus: Skin Prep Primary Wound Dressing: Wound #1 Right Elbow: Santyl Ointment Wound #2 Left Calcaneus: Santyl Ointment Wound #5 Left Lower Leg: Boardered Foam Dressing Wound #6 Left,Medial Metatarsal head first: Boardered Foam Dressing Wound #7 Left,Lateral Elbow: Boardered Foam Dressing Wound #8 Left,Medial Gluteus: Boardered Foam Dressing Secondary Dressing: Wound #1 Right Elbow: Boardered Foam Dressing Wound #2 Left Calcaneus: Boardered Foam Dressing Wound #6 Left,Medial Metatarsal head first: Boardered Foam Dressing Wound #7  Left,Lateral Elbow: Boardered Foam Dressing Wound #8 Left,Medial Gluteus: Boardered Foam Dressing Dressing Change Frequency: Wound #1 Right Elbow: Change dressing every day. Wound #2 Left Calcaneus: Change dressing every other day. Wound #6 Left,Medial Metatarsal head first: Change dressing every other day. Wound #7 Left,Lateral Elbow: Change dressing every other day. Wound #8 Left,Medial Gluteus: Wendel, Sherrel ([id number]) Change dressing every other day. - unless it falls off or becomes soiled Follow-up Appointments: Wound #1 Right Elbow: Return Appointment in 1 week. Wound #2 Left Calcaneus: Return Appointment in 1 week. Wound #5 Left Lower Leg: Return Appointment in 1 week. Wound #6 Left,Medial Metatarsal head first: Return Appointment in 1 week. Wound #7 Left,Lateral Elbow: Return Appointment in 1 week. Wound #8 Left,Medial Gluteus: Return Appointment in 1 week. Wound #9 Right,Posterior Calf: Return Appointment in 1 week. Edema Control: Wound #1 Right Elbow: Elevate legs to the level of the heart and pump ankles as often as possible Wound #2 Left Calcaneus: Elevate legs to the level of the heart and pump ankles as often as possible Wound #5 Left Lower Leg: Elevate legs to the level of the heart and pump ankles as often as possible Wound #6 Left,Medial Metatarsal head first: Elevate legs to the level of the heart and pump ankles as often as possible Wound #7 Left,Lateral Elbow: Elevate legs to the level of the heart and pump ankles as often as possible Wound #8 Left,Medial Gluteus: Elevate legs to the level of the heart and pump ankles as often as possible Wound #9 Right,Posterior Calf: Elevate legs to the level of the heart and pump ankles as often as possible Off-Loading: Wound #1 Right Elbow: Turn and reposition every 2 hours Wound #2 Left Calcaneus: Turn and reposition every 2 hours Wound #5 Left Lower Leg: Turn and reposition every 2 hours Wound #6  Left,Medial Metatarsal head first: Turn and reposition every 2 hours Wound #7 Left,Lateral Elbow: Turn and reposition every 2 hours Wound #8 Left,Medial Gluteus: Turn and reposition every 2 hours Wound #9 Right,Posterior Calf: Turn and reposition every 2 hours Additional Orders / Instructions: Wound #1 Right Elbow: Increase protein intake. Wound #2 Left Calcaneus: Increase protein intake. Halladay, Hawley ([id number]) Wound #5 Left Lower Leg: Increase protein intake. Wound #6 Left,Medial Metatarsal head first: Increase protein intake. Wound #7 Left,Lateral Elbow: Increase protein intake. Wound #8 Left,Medial Gluteus: Increase protein intake. Wound #9 Right,Posterior Calf: Increase protein intake. Home Health: Wound #1 Right Elbow: Continue Home Health Visits Home Health Nurse may visit PRN to address patient s wound care needs. FACE TO FACE ENCOUNTER: MEDICARE and MEDICAID PATIENTS: I certify that this patient is under my care and that I had a face-to-face encounter that meets the physician face-to-face encounter requirements with this patient on this date. The encounter with the patient was in whole or in part for the following MEDICAL CONDITION: (primary reason for Home Healthcare) MEDICAL NECESSITY: I  certify, that based on my findings, NURSING services are a medically necessary home health service. HOME BOUND STATUS: I certify that my clinical findings support that this patient is homebound (i.e., Due to illness or injury, pt requires aid of supportive devices such as crutches, cane, wheelchairs, walkers, the use of special transportation or the assistance of another person to leave their place of residence. There is a normal inability to leave the home and doing so requires considerable and taxing effort. Other absences are for medical reasons / religious services and are infrequent or of short duration when for other reasons). If current dressing causes regression in wound  condition, may D/C ordered dressing product/s and apply Normal Saline Moist Dressing daily until next Wound Healing Center / Other MD appointment. Notify Wound Healing Center of regression in wound condition at [phone]. Please direct any NON-WOUND related issues/requests for orders to patient's Primary Care Physician Wound #2 Left Calcaneus: Continue Home Health Visits Home Health Nurse may visit PRN to address patient s wound care needs. FACE TO FACE ENCOUNTER: MEDICARE and MEDICAID PATIENTS: I certify that this patient is under my care and that I had a face-to-face encounter that meets the physician face-to-face encounter requirements with this patient on this date. The encounter with the patient was in whole or in part for the following MEDICAL CONDITION: (primary reason for Home Healthcare) MEDICAL NECESSITY: I certify, that based on my findings, NURSING services are a medically necessary home health service. HOME BOUND STATUS: I certify that my clinical findings support that this patient is homebound (i.e., Due to illness or injury, pt requires aid of supportive devices such as crutches, cane, wheelchairs, walkers, the use of special transportation or the assistance of another person to leave their place of residence. There is a normal inability to leave the home and doing so requires considerable and taxing effort. Other absences are for medical reasons / religious services and are infrequent or of short duration when for other reasons). If current dressing causes regression in wound condition, may D/C ordered dressing product/s and apply Normal Saline Moist Dressing daily until next Wound Healing Center / Other MD appointment. Notify Wound Healing Center of regression in wound condition at [phone]. Please direct any NON-WOUND related issues/requests for orders to patient's Primary Care Physician Wound #5 Left Lower Leg: Continue Home Health Visits Home Health Nurse may visit  PRN to address patient s wound care needs. FACE TO FACE ENCOUNTER: MEDICARE and MEDICAID PATIENTS: I certify that this patient is under my care and that I had a face-to-face encounter that meets the physician face-to-face encounter requirements with this patient on this date. The encounter with the patient was in whole or in part for the [place], [place] ([id number]) following MEDICAL CONDITION: (primary reason for Home Healthcare) MEDICAL NECESSITY: I certify, that based on my findings, NURSING services are a medically necessary home health service. HOME BOUND STATUS: I certify that my clinical findings support that this patient is homebound (i.e., Due to illness or injury, pt requires aid of supportive devices such as crutches, cane, wheelchairs, walkers, the use of special transportation or the assistance of another person to leave their place of residence. There is a normal inability to leave the home and doing so requires considerable and taxing effort. Other absences are for medical reasons / religious services and are infrequent or of short duration when for other reasons). If current dressing causes regression in wound condition, may D/C ordered dressing product/s and apply Normal Saline Moist  Dressing daily until next Wound Healing Center / Other MD appointment. Notify Wound Healing Center of regression in wound condition at [phone]. Please direct any NON-WOUND related issues/requests for orders to patient's Primary Care Physician Wound #6 Left,Medial Metatarsal head first: Continue Home Health Visits Home Health Nurse may visit PRN to address patient s wound care needs. FACE TO FACE ENCOUNTER: MEDICARE and MEDICAID PATIENTS: I certify that this patient is under my care and that I had a face-to-face encounter that meets the physician face-to-face encounter requirements with this patient on this date. The encounter with the patient was in whole or in part for the following MEDICAL  CONDITION: (primary reason for Home Healthcare) MEDICAL NECESSITY: I certify, that based on my findings, NURSING services are a medically necessary home health service. HOME BOUND STATUS: I certify that my clinical findings support that this patient is homebound (i.e., Due to illness or injury, pt requires aid of supportive devices such as crutches, cane, wheelchairs, walkers, the use of special transportation or the assistance of another person to leave their place of residence. There is a normal inability to leave the home and doing so requires considerable and taxing effort. Other absences are for medical reasons / religious services and are infrequent or of short duration when for other reasons). If current dressing causes regression in wound condition, may D/C ordered dressing product/s and apply Normal Saline Moist Dressing daily until next Wound Healing Center / Other MD appointment. Notify Wound Healing Center of regression in wound condition at [phone]. Please direct any NON-WOUND related issues/requests for orders to patient's Primary Care Physician Wound #7 Left,Lateral Elbow: Continue Home Health Visits Home Health Nurse may visit PRN to address patient s wound care needs. FACE TO FACE ENCOUNTER: MEDICARE and MEDICAID PATIENTS: I certify that this patient is under my care and that I had a face-to-face encounter that meets the physician face-to-face encounter requirements with this patient on this date. The encounter with the patient was in whole or in part for the following MEDICAL CONDITION: (primary reason for Home Healthcare) MEDICAL NECESSITY: I certify, that based on my findings, NURSING services are a medically necessary home health service. HOME BOUND STATUS: I certify that my clinical findings support that this patient is homebound (i.e., Due to illness or injury, pt requires aid of supportive devices such as crutches, cane, wheelchairs, walkers, the use of special  transportation or the assistance of another person to leave their place of residence. There is a normal inability to leave the home and doing so requires considerable and taxing effort. Other absences are for medical reasons / religious services and are infrequent or of short duration when for other reasons). If current dressing causes regression in wound condition, may D/C ordered dressing product/s and apply Normal Saline Moist Dressing daily until next Wound Healing Center / Other MD appointment. Notify Wound Healing Center of regression in wound condition at [phone]. Please direct any NON-WOUND related issues/requests for orders to patient's Primary Care Physician Wound #8 Left,Medial Gluteus: Continue Home Health Visits Home Health Nurse may visit PRN to address patient s wound care needs. FACE TO FACE ENCOUNTER: MEDICARE and MEDICAID PATIENTS: I certify that this patient is under my care and that I had a face-to-face encounter that meets the physician face-to-face encounter requirements with this patient on this date. The encounter with the patient was in whole or in part for the [place] ([id number]) following MEDICAL CONDITION: (primary reason for Home Healthcare) MEDICAL NECESSITY: I certify, that  based on my findings, NURSING services are a medically necessary home health service. HOME BOUND STATUS: I certify that my clinical findings support that this patient is homebound (i.e., Due to illness or injury, pt requires aid of supportive devices such as crutches, cane, wheelchairs, walkers, the use of special transportation or the assistance of another person to leave their place of residence. There is a normal inability to leave the home and doing so requires considerable and taxing effort. Other absences are for medical reasons / religious services and are infrequent or of short duration when for other reasons). If current dressing causes regression in wound condition, may D/C  ordered dressing product/s and apply Normal Saline Moist Dressing daily until next Wound Healing Center / Other MD appointment. Notify Wound Healing Center of regression in wound condition at [phone]. Please direct any NON-WOUND related issues/requests for orders to patient's Primary Care Physician Wound #9 Right,Posterior Calf: Continue Home Health Visits Home Health Nurse may visit PRN to address patient s wound care needs. FACE TO FACE ENCOUNTER: MEDICARE and MEDICAID PATIENTS: I certify that this patient is under my care and that I had a face-to-face encounter that meets the physician face-to-face encounter requirements with this patient on this date. The encounter with the patient was in whole or in part for the following MEDICAL CONDITION: (primary reason for Home Healthcare) MEDICAL NECESSITY: I certify, that based on my findings, NURSING services are a medically necessary home health service. HOME BOUND STATUS: I certify that my clinical findings support that this patient is homebound (i.e., Due to illness or injury, pt requires aid of supportive devices such as crutches, cane, wheelchairs, walkers, the use of special transportation or the assistance of another person to leave their place of residence. There is a normal inability to leave the home and doing so requires considerable and taxing effort. Other absences are for medical reasons / religious services and are infrequent or of short duration when for other reasons). If current dressing causes regression in wound condition, may D/C ordered dressing product/s and apply Normal Saline Moist Dressing daily until next Wound Healing Center / Other MD appointment. Notify Wound Healing Center of regression in wound condition at [phone]. Please direct any NON-WOUND related issues/requests for orders to patient's Primary Care Physician I have recommended: 1. Santyl ointment locally to the right elbow area, and the left heel wound,  which should be applied daily. 2. to the left elbow we will apply a piece of border foam. 3. We have discussed offloading techniques in detail for the lower extremities and at the present time will apply some Santyl ointment to the left heels. 4. good control of her CHF and lymphedema. Elevation and exercises the only thing we can do at present time and I have told her to stop using the TED hose. 5. Review at the wound center next week. Electronic Signature(s) Signed: 01/01/2016 4:10:11 PM By: Evlyn Kanner MD, FACS Previous Signature: 01/01/2016 4:09:56 PM Version By: Evlyn Kanner MD, FACS Previous Signature: 01/01/2016 10:46:21 AM Version By: Evlyn Kanner MD, FACS [place], Tyechia ([id number]) Entered By: Evlyn Kanner on 01/01/2016 16:10:11 [name] ([id number]) -------------------------------------------------------------------------------- SuperBill Details Patient Name: [name] Date of Service: 01/01/2016 Medical Record Patient Account Number: [id number] Number: Afful, RN, BSN, Treating RN: [birth year] (82 y.o. [place] Date of Birth/Sex: Female) Other Clinician: Primary Care Physician: SYSTEM, PCP Treating Katalin Colledge Referring Physician: Physician/Extender: Tania Ade in Treatment: 4 Diagnosis Coding ICD-10 Codes Code Description S41.101A Unspecified open wound of right upper  arm, initial encounter L89.629 Pressure ulcer of left heel, unspecified stage I50.40 Unspecified combined systolic (congestive) and diastolic (congestive) heart failure I89.0 Lymphedema, not elsewhere classified L89.322 Pressure ulcer of left buttock, stage 2 S41.102A Unspecified open wound of left upper arm, initial encounter Facility Procedures CPT4 Code: [id number] Description: 11042 - DEB SUBQ TISSUE 20 SQ CM/< ICD-10 Description Diagnosis S41.101A Unspecified open wound of right upper arm, initial L89.629 Pressure ulcer of left heel, unspecified stage I89.0 Lymphedema, not elsewhere  classified Modifier: encounter Quantity: 1 Physician Procedures CPT4 Code: [id number] Description: 11042 - WC PHYS SUBQ TISS 20 SQ CM ICD-10 Description Diagnosis S41.101A Unspecified open wound of right upper arm, initial L89.629 Pressure ulcer of left heel, unspecified stage I89.0 Lymphedema, not elsewhere classified Modifier: encounter Quantity: 1 Electronic Signature(s) Signed: 01/01/2016 10:46:39 AM By: Evlyn Kanner MD, FACS Entered By: Evlyn Kanner on 01/01/2016 10:46:38

## 2016-01-08 ENCOUNTER — Encounter: Payer: Self-pay | Admitting: General Surgery

## 2016-01-08 ENCOUNTER — Encounter (HOSPITAL_BASED_OUTPATIENT_CLINIC_OR_DEPARTMENT_OTHER): Payer: Medicare Other | Admitting: General Surgery

## 2016-01-08 DIAGNOSIS — L8990 Pressure ulcer of unspecified site, unspecified stage: Secondary | ICD-10-CM

## 2016-01-08 DIAGNOSIS — S41101A Unspecified open wound of right upper arm, initial encounter: Secondary | ICD-10-CM | POA: Diagnosis not present

## 2016-01-08 NOTE — Progress Notes (Signed)
Debrided left heel with curette curette Wound 2 cm diameter.  Rx with Santyl Daily

## 2016-01-09 NOTE — Progress Notes (Signed)
[name], November ([id number]) Visit Report for 01/08/2016 Arrival Information Details Patient Name: [name], [name] Date of Service: 01/08/2016 3:00 PM Medical Record Number: [id number] Patient Account Number: [id number] Date of Birth/Sex: 1928/07/04 (82 y.o. Female) Treating RN: Curtis Sitesorthy, Joanna Primary Care Physician: SYSTEM, PCP Other Clinician: Referring Physician: Treating Physician/Extender: Elayne SnarePARKER, PETER Weeks in Treatment: 5 Visit Information History Since Last Visit Added or deleted any medications: No Patient Arrived: Wheel Chair Any new allergies or adverse reactions: No Arrival Time: 15:07 Had a fall or experienced change in No Accompanied By: sister activities of daily living that may affect Transfer Assistance: Michiel SitesHoyer Lift risk of falls: Patient Identification Verified: Yes Signs or symptoms of abuse/neglect since last No Secondary Verification Process Yes visito Completed: Hospitalized since last visit: No Patient Requires Transmission- No Pain Present Now: No Based Precautions: Patient Has Alerts: Yes Patient Alerts: Patient on Blood Thinner ABI: (L) 1.56 Eliquis Electronic Signature(s) Signed: 01/08/2016 5:21:35 PM By: Curtis Sitesorthy, Joanna Entered By: Curtis Sitesorthy, Joanna on 01/08/2016 15:13:54 [name], [name] ([id number]) -------------------------------------------------------------------------------- Encounter Discharge Information Details Patient Name: [name], [name] Date of Service: 01/08/2016 3:00 PM Medical Record Number: [id number] Patient Account Number: [id number] Date of Birth/Sex: 1928/07/04 (82 y.o. Female) Treating RN: Curtis Sitesorthy, Joanna Primary Care Physician: SYSTEM, PCP Other Clinician: Referring Physician: Treating Physician/Extender: Elayne SnarePARKER, PETER Weeks in Treatment: 5 Encounter Discharge Information Items Discharge Pain Level: 0 Discharge Condition: Stable Ambulatory Status: Wheelchair Discharge Destination: Home Transportation: Private Auto Accompanied By:  sister Schedule Follow-up Appointment: Yes Medication Reconciliation completed and provided to Patient/Care No Fabiana Dromgoole: Provided on Clinical Summary of Care: 01/08/2016 Form Type Recipient Paper Patient MJ Electronic Signature(s) Signed: 01/08/2016 5:06:31 PM By: Curtis Sitesorthy, Joanna Previous Signature: 01/08/2016 4:54:31 PM Version By: Ardath SaxParker, Peter MD Previous Signature: 01/08/2016 4:30:27 PM Version By: Gwenlyn PerkingMoore, Shelia Entered By: Curtis Sitesorthy, Joanna on 01/08/2016 17:06:31 [name], [name] ([id number]) -------------------------------------------------------------------------------- Multi Wound Chart Details Patient Name: [name], [name] Date of Service: 01/08/2016 3:00 PM Medical Record Number: [id number] Patient Account Number: [id number] Date of Birth/Sex: 1928/07/04 (82 y.o. Female) Treating RN: Curtis Sitesorthy, Joanna Primary Care Physician: SYSTEM, PCP Other Clinician: Referring Physician: Treating Physician/Extender: Elayne SnarePARKER, PETER Weeks in Treatment: 5 Vital Signs Height(in): 64 Pulse(bpm): 83 Weight(lbs): 153 Blood Pressure 120/61 (mmHg): Body Mass Index(BMI): 26 Temperature(F): Respiratory Rate 16 (breaths/min): Photos: [1:No Photos] [2:No Photos] [5:No Photos] Wound Location: [1:Right Elbow] [2:Left Calcaneus] [5:Left Lower Leg] Wounding Event: [1:Gradually Appeared] [2:Gradually Appeared] [5:Gradually Appeared] Primary Etiology: [1:Infection - not elsewhere classified] [2:Pressure Ulcer] [5:Pressure Ulcer] Comorbid History: [1:Cataracts, Lymphedema, Aspiration, Chronic Obstructive Pulmonary Disease (COPD), Arrhythmia, Congestive Heart Failure, Hypertension, Peripheral Venous Disease, Osteoarthritis, Dementia] [2:Cataracts, Lymphedema, Aspiration, Chronic  Obstructive Pulmonary Disease (COPD), Arrhythmia, Congestive Heart Failure, Hypertension, Peripheral Venous Disease, Osteoarthritis, Dementia] [5:Cataracts, Lymphedema, Aspiration, Chronic Obstructive Pulmonary Disease (COPD), Arrhythmia,  Congestive  Heart Failure, Hypertension, Peripheral Venous Disease, Osteoarthritis, Dementia] Date Acquired: [1:11/14/2015] [2:10/30/2015] [5:12/13/2015] Weeks of Treatment: [1:5] [2:4] [5:3] Wound Status: [1:Open] [2:Open] [5:Open] Measurements L x W x D 2.2x2x1.2 [2:2.4x1.2x0.2] [5:0.5x0.7x0.1] (cm) Area (cm) : [1:3.456] [2:2.262] [5:0.275] Volume (cm) : [1:4.147] [2:0.452] [5:0.027] % Reduction in Area: [1:68.60%] [2:52.00%] [5:92.20%] % Reduction in Volume: 77.80% [2:4.00%] [5:92.40%] Starting Position 1 3 (o'clock): Ending Position 1 [1:6] (o'clock): Maximum Distance 1 0.3 (cm): Undermining: [1:Yes] [2:No] [5:No] Classification: Full Thickness Without Unstageable/Unclassified Category/Stage II Exposed Support Structures Exudate Amount: Large Small Large Exudate Type: Serosanguineous Serosanguineous Serous Exudate Color: red, brown red, brown amber Foul Odor After Yes No No Cleansing: Odor Anticipated Due to No N/A N/A Product Use: Wound Margin: Flat and Intact Flat and Intact  Indistinct, nonvisible Granulation Amount: Small (1-33%) Small (1-33%) Medium (34-66%) Granulation Quality: Pink Pink Red Necrotic Amount: Large (67-100%) Large (67-100%) Small (1-33%) Necrotic Tissue: Adherent Slough Eschar, Adherent Slough Adherent Slough Exposed Structures: Fascia: No Fascia: No Fascia: No Fat: No Fat: No Fat: No Tendon: No Tendon: No Tendon: No Muscle: No Muscle: No Muscle: No Joint: No Joint: No Joint: No Bone: No Bone: No Bone: No Limited to Skin Limited to Skin Limited to Skin Breakdown Breakdown Breakdown Epithelialization: None None Small (1-33%) Periwound Skin Texture: Edema: Yes Edema: Yes Edema: Yes Excoriation: No Excoriation: No Induration: No Induration: No Callus: No Callus: No Crepitus: No Crepitus: No Fluctuance: No Fluctuance: No Friable: No Friable: No Rash: No Rash: No Scarring: No Scarring: No Periwound Skin Moist: Yes Moist:  Yes Moist: Yes Moisture: Dry/Scaly: Yes Maceration: No Maceration: No Dry/Scaly: No Periwound Skin Color: Erythema: Yes Atrophie Blanche: No Atrophie Blanche: No Cyanosis: No Cyanosis: No Ecchymosis: No Ecchymosis: No Erythema: No Erythema: No Hemosiderin Staining: No Hemosiderin Staining: No Mottled: No Mottled: No Pallor: No Pallor: No Rubor: No Rubor: No Erythema Location: Circumferential N/A N/A Erythema Change: N/A N/A N/A Temperature: No Abnormality No Abnormality No Abnormality Tenderness on Yes Yes Yes Palpation: Wound Preparation: Koeppen, Johnny Bridge ([id number]) Ulcer Cleansing: Ulcer Cleansing: Ulcer Cleansing: Rinsed/Irrigated with Rinsed/Irrigated with Rinsed/Irrigated with Saline Saline Saline Topical Anesthetic Topical Anesthetic Topical Anesthetic Applied: Other: lidocaine Applied: Other: lidocaine Applied: Other: lidocaine 4% 4% 4% Wound Number: [id number]  Photos: No Photos No Photos No Photos Wound Location: Left Metatarsal head first - Left, Lateral Elbow Left Gluteus - Medial Medial Wounding Event: Pressure Injury Trauma Pressure Injury Primary Etiology: Pressure Ulcer Skin Tear Pressure Ulcer Comorbid History: Cataracts, Lymphedema, N/A Cataracts, Lymphedema, Aspiration, Chronic Aspiration, Chronic Obstructive Pulmonary Obstructive Pulmonary Disease (COPD), Disease (COPD), Arrhythmia, Congestive Arrhythmia, Congestive Heart Failure, Heart Failure, Hypertension, Peripheral Hypertension, Peripheral Venous Disease, Venous Disease, Osteoarthritis, Dementia Osteoarthritis, Dementia Date Acquired: 12/25/2015 12/23/2015 12/11/2015 Weeks of Treatment: 2 2 2  Wound Status: Open Healed - Epithelialized Open Measurements L x W x D 0.6x0.8x0.1 0x0x0 0.7x0.4x0.1 (cm) Area (cm) : 0.377 0 0.22 Volume (cm) : 0.038 0 0.022 % Reduction in Area: 40.00% 100.00% 72.00% % Reduction in Volume: 39.70% 100.00% 72.20% Undermining: No N/A No Classification: Category/Stage  II Full Thickness Without Category/Stage II Exposed Support Structures Exudate Amount: Small N/A Small Exudate Type: Serous N/A Serous Exudate Color: amber N/A amber Foul Odor After No N/A No Cleansing: Odor Anticipated Due to N/A N/A N/A Product Use: Wound Margin: Distinct, outline attached N/A Flat and Intact Granulation Amount: Medium (34-66%) N/A Medium (34-66%) Granulation Quality: Pink, Pale N/A Pink Necrotic Amount: Small (1-33%) N/A Medium (34-66%) Necrotic Tissue: Adherent Slough N/A Adherent Slough Exposed Structures: N/A DIESHA, ROSTAD ([id number]) Fascia: No Fascia: No Fat: No Fat: No Tendon: No Tendon: No Muscle: No Muscle: No Joint: No Joint: No Bone: No Bone: No Limited to Skin Limited to Skin Breakdown Breakdown Epithelialization: Medium (34-66%) N/A N/A Periwound Skin Texture: Edema: No No Abnormalities Noted Edema: No Excoriation: No Induration: No Callus: No Crepitus: No Fluctuance: No Friable: No Rash: No Scarring: No Periwound Skin Dry/Scaly: Yes No Abnormalities Noted Maceration: No Moisture: Maceration: No Moist: No Dry/Scaly: No Periwound Skin Color: Erythema: Yes No Abnormalities Noted Atrophie Blanche: No Cyanosis: No Ecchymosis: No Erythema: No Hemosiderin Staining: No Mottled: No Pallor: No Rubor: No Erythema Location: Circumferential N/A N/A Erythema Change: Decreased N/A N/A Temperature: No Abnormality N/A N/A Tenderness on No No No Palpation: Wound Preparation:  Ulcer Cleansing: N/A Ulcer Cleansing: Rinsed/Irrigated with Rinsed/Irrigated with Saline Saline Topical Anesthetic Topical Anesthetic Applied: Other: lidocaine Applied: Other: lidocaine 4% 4% Wound Number: 9 N/A N/A Photos: No Photos N/A N/A Wound Location: Right, Posterior Calf N/A N/A Wounding Event: Pressure Injury N/A N/A Primary Etiology: Pressure Ulcer N/A N/A Comorbid History: N/A N/A N/A Date Acquired: 12/13/2015 N/A N/A Weeks of Treatment: 2 N/A  N/A Whalin, Shaden ([id number]) Wound Status: Healed - Epithelialized N/A N/A Measurements L x W x D 0x0x0 N/A N/A (cm) Area (cm) : 0 N/A N/A Volume (cm) : 0 N/A N/A % Reduction in Area: 100.00% N/A N/A % Reduction in Volume: 100.00% N/A N/A Undermining: N/A N/A N/A Classification: Category/Stage II N/A N/A Exudate Amount: N/A N/A N/A Exudate Type: N/A N/A N/A Exudate Color: N/A N/A N/A Foul Odor After N/A N/A N/A Cleansing: Odor Anticipated Due to N/A N/A N/A Product Use: Wound Margin: N/A N/A N/A Granulation Amount: N/A N/A N/A Granulation Quality: N/A N/A N/A Necrotic Amount: N/A N/A N/A Necrotic Tissue: N/A N/A N/A Exposed Structures: N/A N/A N/A Epithelialization: N/A N/A N/A Periwound Skin Texture: No Abnormalities Noted N/A N/A Periwound Skin No Abnormalities Noted N/A N/A Moisture: Periwound Skin Color: No Abnormalities Noted N/A N/A Erythema Location: N/A N/A N/A Erythema Change: N/A N/A N/A Temperature: N/A N/A N/A Tenderness on No N/A N/A Palpation: Wound Preparation: N/A N/A N/A Treatment Notes Electronic Signature(s) Signed: 01/08/2016 5:21:35 PM By: Curtis Sites Entered By: Curtis Sites on 01/08/2016 15:35:17 Anita Buba ([id number]) -------------------------------------------------------------------------------- Multi-Disciplinary Care Plan Details Patient Name: Anita Buba Date of Service: 01/08/2016 3:00 PM Medical Record Number: [id number] Patient Account Number: [id number] Date of Birth/Sex: 07-31-1928 (82 y.o. Female) Treating RN: Curtis Sites Primary Care Physician: SYSTEM, PCP Other Clinician: Referring Physician: Treating Physician/Extender: Elayne Snare in Treatment: 5 Active Inactive Abuse / Safety / Falls / Self Care Management Nursing Diagnoses: Potential for falls Goals: Patient will remain injury free Date Initiated: 12/04/2015 Goal Status: Active Interventions: Assess fall risk on admission and as  needed Notes: Nutrition Nursing Diagnoses: Potential for alteratiion in Nutrition/Potential for imbalanced nutrition Goals: Patient/caregiver agrees to and verbalizes understanding of need to obtain nutritional consultation Date Initiated: 12/04/2015 Goal Status: Active Interventions: Provide education on nutrition Treatment Activities: Education provided on Nutrition : 01/01/2016 Notes: Orientation to the Wound Care Program Nursing Diagnoses: Knowledge deficit related to the wound healing center program JERRELL, HART ([id number]) Goals: Patient/caregiver will verbalize understanding of the Wound Healing Center Program Date Initiated: 12/04/2015 Goal Status: Active Interventions: Provide education on orientation to the wound center Notes: Venous Leg Ulcer Nursing Diagnoses: Actual venous Insuffiency (use after diagnosis is confirmed) Goals: Verify adequate tissue perfusion prior to therapeutic compression application Date Initiated: 12/04/2015 Goal Status: Active Interventions: Provide education on venous insufficiency Notes: Wound/Skin Impairment Nursing Diagnoses: Impaired tissue integrity Goals: Ulcer/skin breakdown will heal within 14 weeks Date Initiated: 12/04/2015 Goal Status: Active Interventions: Assess patient/caregiver ability to obtain necessary supplies Notes: Electronic Signature(s) Signed: 01/08/2016 5:21:35 PM By: Curtis Sites Entered By: Curtis Sites on 01/08/2016 15:35:09 Anita Buba ([id number]) -------------------------------------------------------------------------------- Patient/Caregiver Education Details Patient Name: Anita Buba Date of Service: 01/08/2016 3:00 PM Medical Record Number: [id number] Patient Account Number: [id number] Date of Birth/Gender: Jun 10, 1929 (82 y.o. Female) Treating RN: Curtis Sites Primary Care Physician: SYSTEM, PCP Other Clinician: Referring Physician: Treating Physician/Extender: Elayne Snare in Treatment:  5 Education Assessment Education Provided To: Caregiver Education Topics Provided Pressure: Handouts: Other: pressure relief Methods: Demonstration, Explain/Verbal Responses: State content correctly Electronic Signature(s) Signed:  01/08/2016 5:07:39 PM By: Curtis Sites Entered By: Curtis Sites on 01/08/2016 17:07:39 Anita Buba ([id number]) -------------------------------------------------------------------------------- Wound Assessment Details Patient Name: Anita Buba Date of Service: 01/08/2016 3:00 PM Medical Record Number: [id number] Patient Account Number: [id number] Date of Birth/Sex: January 06, 1929 (82 y.o. Female) Treating RN: Curtis Sites Primary Care Physician: SYSTEM, PCP Other Clinician: Referring Physician: Treating Physician/Extender: Elayne Snare in Treatment: 5 Wound Status Wound Number: 1 Primary Infection - not elsewhere classified Etiology: Wound Location: Right Elbow Wound Open Wounding Event: Gradually Appeared Status: Date Acquired: 11/14/2015 Comorbid Cataracts, Lymphedema, Aspiration, Weeks Of Treatment: 5 History: Chronic Obstructive Pulmonary Disease Clustered Wound: No (COPD), Arrhythmia, Congestive Heart Failure, Hypertension, Peripheral Venous Disease, Osteoarthritis, Dementia Photos Photo Uploaded By: Elliot Gurney, RN, BSN, Kim on 01/08/2016 15:40:22 Wound Measurements Length: (cm) 2.2 Width: (cm) 2 Depth: (cm) 1.2 Area: (cm) 3.456 Volume: (cm) 4.147 % Reduction in Area: 68.6% % Reduction in Volume: 77.8% Epithelialization: None Tunneling: No Undermining: Yes Starting Position (o'clock): 3 Ending Position (o'clock): 6 Maximum Distance: (cm) 0.3 Wound Description Full Thickness Without Exposed Foul Odor A Classification: Support Structures Due to Prod Wound Margin: Flat and Intact Exudate Large Amount: Tovey, Johnny Bridge ([id number]) fter Cleansing: Yes uct Use: No Exudate Type: Serosanguineous Exudate Color: red,  brown Wound Bed Granulation Amount: Small (1-33%) Exposed Structure Granulation Quality: Pink Fascia Exposed: No Necrotic Amount: Large (67-100%) Fat Layer Exposed: No Necrotic Quality: Adherent Slough Tendon Exposed: No Muscle Exposed: No Joint Exposed: No Bone Exposed: No Limited to Skin Breakdown Periwound Skin Texture Texture Color No Abnormalities Noted: No No Abnormalities Noted: No Localized Edema: Yes Erythema: Yes Erythema Location: Circumferential Moisture No Abnormalities Noted: No Temperature / Pain Moist: Yes Temperature: No Abnormality Tenderness on Palpation: Yes Wound Preparation Ulcer Cleansing: Rinsed/Irrigated with Saline Topical Anesthetic Applied: Other: lidocaine 4%, Treatment Notes Wound #1 (Right Elbow) 1. Cleansed with: Clean wound with Normal Saline 2. Anesthetic Topical Lidocaine 4% cream to wound bed prior to debridement 3. Peri-wound Care: Skin Prep 4. Dressing Applied: Santyl Ointment 5. Secondary Dressing Applied Bordered Foam Dressing Electronic Signature(s) Signed: 01/08/2016 5:21:35 PM By: Curtis Sites Entered By: Curtis Sites on 01/08/2016 15:25:59 Anita Buba ([id number]) -------------------------------------------------------------------------------- Wound Assessment Details Patient Name: Anita Buba Date of Service: 01/08/2016 3:00 PM Medical Record Number: [id number] Patient Account Number: [id number] Date of Birth/Sex: May 13, 1929 (82 y.o. Female) Treating RN: Curtis Sites Primary Care Physician: SYSTEM, PCP Other Clinician: Referring Physician: Treating Physician/Extender: Elayne Snare in Treatment: 5 Wound Status Wound Number: 2 Primary Pressure Ulcer Etiology: Wound Location: Left Calcaneus Wound Open Wounding Event: Gradually Appeared Status: Date Acquired: 10/30/2015 Comorbid Cataracts, Lymphedema, Aspiration, Weeks Of Treatment: 4 History: Chronic Obstructive Pulmonary Disease Clustered Wound:  No (COPD), Arrhythmia, Congestive Heart Failure, Hypertension, Peripheral Venous Disease, Osteoarthritis, Dementia Photos Photo Uploaded By: Elliot Gurney, RN, BSN, Kim on 01/08/2016 15:40:22 Wound Measurements Length: (cm) 2.4 Width: (cm) 1.2 Depth: (cm) 0.2 Area: (cm) 2.262 Volume: (cm) 0.452 % Reduction in Area: 52% % Reduction in Volume: 4% Epithelialization: None Tunneling: No Undermining: No Wound Description Classification: Unstageable/Unclassified Wound Margin: Flat and Intact Exudate Amount: Small Exudate Type: Serosanguineous Exudate Color: red, brown Foul Odor After Cleansing: No Wound Bed Granulation Amount: Small (1-33%) Exposed Structure Granulation Quality: Pink Fascia Exposed: No Minjares, Shelene ([id number]) Necrotic Amount: Large (67-100%) Fat Layer Exposed: No Necrotic Quality: Eschar, Adherent Slough Tendon Exposed: No Muscle Exposed: No Joint Exposed: No Bone Exposed: No Limited to Skin Breakdown Periwound Skin Texture Texture Color No Abnormalities Noted: No No Abnormalities Noted: No Callus: No  Atrophie Blanche: No Crepitus: No Cyanosis: No Excoriation: No Ecchymosis: No Fluctuance: No Erythema: No Friable: No Hemosiderin Staining: No Induration: No Mottled: No Localized Edema: Yes Pallor: No Rash: No Rubor: No Scarring: No Temperature / Pain Moisture Temperature: No Abnormality No Abnormalities Noted: No Tenderness on Palpation: Yes Dry / Scaly: Yes Maceration: No Moist: Yes Wound Preparation Ulcer Cleansing: Rinsed/Irrigated with Saline Topical Anesthetic Applied: Other: lidocaine 4%, Treatment Notes Wound #2 (Left Calcaneus) 1. Cleansed with: Clean wound with Normal Saline 2. Anesthetic Topical Lidocaine 4% cream to wound bed prior to debridement 3. Peri-wound Care: Skin Prep 4. Dressing Applied: Santyl Ointment 5. Secondary Dressing Applied Bordered Foam Dressing Electronic Signature(s) Signed: 01/08/2016 5:21:35 PM By:  Curtis Sites Entered By: Curtis Sites on 01/08/2016 15:30:09 JAVONNA, BALLI ([id number]) AZHARIA, SURRATT ([id number]) -------------------------------------------------------------------------------- Wound Assessment Details Patient Name: Anita Buba Date of Service: 01/08/2016 3:00 PM Medical Record Number: [id number] Patient Account Number: [id number] Date of Birth/Sex: 1928-10-09 (82 y.o. Female) Treating RN: Curtis Sites Primary Care Physician: SYSTEM, PCP Other Clinician: Referring Physician: Treating Physician/Extender: Elayne Snare in Treatment: 5 Wound Status Wound Number: 5 Primary Pressure Ulcer Etiology: Wound Location: Left Lower Leg Wound Open Wounding Event: Gradually Appeared Status: Date Acquired: 12/13/2015 Comorbid Cataracts, Lymphedema, Aspiration, Weeks Of Treatment: 3 History: Chronic Obstructive Pulmonary Disease Clustered Wound: No (COPD), Arrhythmia, Congestive Heart Failure, Hypertension, Peripheral Venous Disease, Osteoarthritis, Dementia Photos Photo Uploaded By: Elliot Gurney, RN, BSN, Kim on 01/08/2016 15:41:00 Wound Measurements Length: (cm) 0.5 Width: (cm) 0.7 Depth: (cm) 0.1 Area: (cm) 0.275 Volume: (cm) 0.027 % Reduction in Area: 92.2% % Reduction in Volume: 92.4% Epithelialization: Small (1-33%) Tunneling: No Undermining: No Wound Description Classification: Category/Stage II Wound Margin: Indistinct, nonvisible Exudate Amount: Large Exudate Type: Serous Exudate Color: amber Foul Odor After Cleansing: No Wound Bed Granulation Amount: Medium (34-66%) Exposed Structure Granulation Quality: Red Fascia Exposed: No Folmar, Kaeya ([id number]) Necrotic Amount: Small (1-33%) Fat Layer Exposed: No Necrotic Quality: Adherent Slough Tendon Exposed: No Muscle Exposed: No Joint Exposed: No Bone Exposed: No Limited to Skin Breakdown Periwound Skin Texture Texture Color No Abnormalities Noted: No No Abnormalities Noted: No Callus:  No Atrophie Blanche: No Crepitus: No Cyanosis: No Excoriation: No Ecchymosis: No Fluctuance: No Erythema: No Friable: No Hemosiderin Staining: No Induration: No Mottled: No Localized Edema: Yes Pallor: No Rash: No Rubor: No Scarring: No Temperature / Pain Moisture Temperature: No Abnormality No Abnormalities Noted: No Tenderness on Palpation: Yes Dry / Scaly: No Maceration: No Moist: Yes Wound Preparation Ulcer Cleansing: Rinsed/Irrigated with Saline Topical Anesthetic Applied: Other: lidocaine 4%, Treatment Notes Wound #5 (Left Lower Leg) 1. Cleansed with: Clean wound with Normal Saline 2. Anesthetic Topical Lidocaine 4% cream to wound bed prior to debridement 3. Peri-wound Care: Skin Prep 5. Secondary Dressing Applied Bordered Foam Dressing Electronic Signature(s) Signed: 01/08/2016 5:21:35 PM By: Curtis Sites Entered By: Curtis Sites on 01/08/2016 15:28:34 Anita Buba ([id number]) -------------------------------------------------------------------------------- Wound Assessment Details Patient Name: Anita Buba Date of Service: 01/08/2016 3:00 PM Medical Record Number: [id number] Patient Account Number: [id number] Date of Birth/Sex: 1929/02/17 (82 y.o. Female) Treating RN: Curtis Sites Primary Care Physician: SYSTEM, PCP Other Clinician: Referring Physician: Treating Physician/Extender: Ardath Sax Weeks in Treatment: 5 Wound Status Wound Number: 6 Primary Pressure Ulcer Etiology: Wound Location: Left Metatarsal head first - Medial Wound Open Status: Wounding Event: Pressure Injury Comorbid Cataracts, Lymphedema, Aspiration, Date Acquired: 12/25/2015 History: Chronic Obstructive Pulmonary Disease Weeks Of Treatment: 2 (COPD), Arrhythmia, Congestive Heart Clustered Wound: No Failure, Hypertension, Peripheral Venous Disease, Osteoarthritis, Dementia  Photos Photo Uploaded By: Elliot Gurney, RN, BSN, Kim on 01/08/2016 15:41:00 Wound  Measurements Length: (cm) 0.6 Width: (cm) 0.8 Depth: (cm) 0.1 Area: (cm) 0.377 Volume: (cm) 0.038 % Reduction in Area: 40% % Reduction in Volume: 39.7% Epithelialization: Medium (34-66%) Tunneling: No Undermining: No Wound Description Classification: Category/Stage II Wound Margin: Distinct, outline attached Exudate Amount: Small Exudate Type: Serous Exudate Color: amber Foul Odor After Cleansing: No Wound Bed Granulation Amount: Medium (34-66%) Exposed Structure Granulation Quality: Pink, Pale Fascia Exposed: No Bulls, Jahmiya ([id number]) Necrotic Amount: Small (1-33%) Fat Layer Exposed: No Necrotic Quality: Adherent Slough Tendon Exposed: No Muscle Exposed: No Joint Exposed: No Bone Exposed: No Limited to Skin Breakdown Periwound Skin Texture Texture Color No Abnormalities Noted: No No Abnormalities Noted: No Localized Edema: No Erythema: Yes Erythema Location: Circumferential Moisture Erythema Change: Decreased No Abnormalities Noted: No Dry / Scaly: Yes Temperature / Pain Maceration: No Temperature: No Abnormality Wound Preparation Ulcer Cleansing: Rinsed/Irrigated with Saline Topical Anesthetic Applied: Other: lidocaine 4%, Treatment Notes Wound #6 (Left, Medial Metatarsal head first) 1. Cleansed with: Clean wound with Normal Saline 2. Anesthetic Topical Lidocaine 4% cream to wound bed prior to debridement 3. Peri-wound Care: Skin Prep 5. Secondary Dressing Applied Bordered Foam Dressing Electronic Signature(s) Signed: 01/08/2016 5:21:35 PM By: Curtis Sites Entered By: Curtis Sites on 01/08/2016 15:29:29 Anita Buba ([id number]) -------------------------------------------------------------------------------- Wound Assessment Details Patient Name: Anita Buba Date of Service: 01/08/2016 3:00 PM Medical Record Number: [id number] Patient Account Number: [id number] Date of Birth/Sex: [birth year] (82 y.o. Female) Treating RN: Curtis Sites Primary Care Physician: SYSTEM, PCP Other Clinician: Referring Physician: Treating Physician/Extender: Elayne Snare in Treatment: 5 Wound Status Wound Number: 7 Primary Etiology: Skin Tear Wound Location: Left, Lateral Elbow Wound Status: Healed - Epithelialized Wounding Event: Trauma Date Acquired: 12/23/2015 Weeks Of Treatment: 2 Clustered Wound: No Photos Photo Uploaded By: Elliot Gurney, RN, BSN, Kim on 01/08/2016 15:41:32 Wound Measurements Length: (cm) 0 % Reducti Width: (cm) 0 % Reducti Depth: (cm) 0 Area: (cm) 0 Volume: (cm) 0 on in Area: 100% on in Volume: 100% Wound Description Full Thickness Without Exposed Classification: Support Structures Periwound Skin Texture Texture Color No Abnormalities Noted: No No Abnormalities Noted: No Moisture No Abnormalities Noted: No Electronic Signature(s) Signed: 01/08/2016 5:21:35 PM By: Avel Peace, Johnny Bridge ([id number]) Entered By: Curtis Sites on 01/08/2016 15:31:00 Anita Buba ([id number]) -------------------------------------------------------------------------------- Wound Assessment Details Patient Name: Anita Buba Date of Service: 01/08/2016 3:00 PM Medical Record Number: [id number] Patient Account Number: [id number] Date of Birth/Sex: [birth year] (82 y.o. Female) Treating RN: Curtis Sites Primary Care Physician: SYSTEM, PCP Other Clinician: Referring Physician: Treating Physician/Extender: Ardath Sax Weeks in Treatment: 5 Wound Status Wound Number: 8 Primary Pressure Ulcer Etiology: Wound Location: Left Gluteus - Medial Wound Open Wounding Event: Pressure Injury Status: Date Acquired: 12/11/2015 Comorbid Cataracts, Lymphedema, Aspiration, Weeks Of Treatment: 2 History: Chronic Obstructive Pulmonary Disease Clustered Wound: No (COPD), Arrhythmia, Congestive Heart Failure, Hypertension, Peripheral Venous Disease, Osteoarthritis, Dementia Photos Photo Uploaded By: Elliot Gurney, RN, BSN,  Kim on 01/08/2016 15:41:33 Wound Measurements Length: (cm) 0.7 Width: (cm) 0.4 Depth: (cm) 0.1 Area: (cm) 0.22 Volume: (cm) 0.022 % Reduction in Area: 72% % Reduction in Volume: 72.2% Tunneling: No Undermining: No Wound Description Classification: Category/Stage II Wound Margin: Flat and Intact Exudate Amount: Small Exudate Type: Serous Exudate Color: amber Foul Odor After Cleansing: No Wound Bed Granulation Amount: Medium (34-66%) Exposed Structure Granulation Quality: Pink Fascia Exposed: No Haynesworth, Jakita ([id number]) Necrotic Amount: Medium (34-66%) Fat Layer Exposed: No Necrotic Quality: Adherent Slough  Tendon Exposed: No Muscle Exposed: No Joint Exposed: No Bone Exposed: No Limited to Skin Breakdown Periwound Skin Texture Texture Color No Abnormalities Noted: No No Abnormalities Noted: No Callus: No Atrophie Blanche: No Crepitus: No Cyanosis: No Excoriation: No Ecchymosis: No Fluctuance: No Erythema: No Friable: No Hemosiderin Staining: No Induration: No Mottled: No Localized Edema: No Pallor: No Rash: No Rubor: No Scarring: No Moisture No Abnormalities Noted: No Dry / Scaly: No Maceration: No Moist: No Wound Preparation Ulcer Cleansing: Rinsed/Irrigated with Saline Topical Anesthetic Applied: Other: lidocaine 4%, Treatment Notes Wound #8 (Left, Medial Gluteus) 1. Cleansed with: Clean wound with Normal Saline 2. Anesthetic Topical Lidocaine 4% cream to wound bed prior to debridement 3. Peri-wound Care: Skin Prep 5. Secondary Dressing Applied Bordered Foam Dressing Electronic Signature(s) Signed: 01/08/2016 5:21:35 PM By: Curtis Sites Entered By: Curtis Sites on 01/08/2016 15:33:59 Anita Buba ([id number]) -------------------------------------------------------------------------------- Wound Assessment Details Patient Name: Anita Buba Date of Service: 01/08/2016 3:00 PM Medical Record Number: [id number] Patient Account Number:  [id number] Date of Birth/Sex: 29-Aug-1928 (82 y.o. Female) Treating RN: Curtis Sites Primary Care Physician: SYSTEM, PCP Other Clinician: Referring Physician: Treating Physician/Extender: Ardath Sax Weeks in Treatment: 5 Wound Status Wound Number: 9 Primary Etiology: Pressure Ulcer Wound Location: Right, Posterior Calf Wound Status: Healed - Epithelialized Wounding Event: Pressure Injury Date Acquired: 12/13/2015 Weeks Of Treatment: 2 Clustered Wound: No Photos Photo Uploaded By: Elliot Gurney, RN, BSN, Kim on 01/08/2016 15:42:30 Wound Measurements Length: (cm) 0 Width: (cm) 0 Depth: (cm) 0 Area: (cm) 0 Volume: (cm) 0 % Reduction in Area: 100% % Reduction in Volume: 100% Wound Description Classification: Category/Stage II Periwound Skin Texture Texture Color No Abnormalities Noted: No No Abnormalities Noted: No Moisture No Abnormalities Noted: No Electronic Signature(s) Signed: 01/08/2016 5:21:35 PM By: Avel Peace, Johnny Bridge ([id number]) Entered By: Curtis Sites on 01/08/2016 15:26:57 Anita Buba ([id number]) -------------------------------------------------------------------------------- Vitals Details Patient Name: Anita Buba Date of Service: 01/08/2016 3:00 PM Medical Record Number: [id number] Patient Account Number: [id number] Date of Birth/Sex: [birth year] (82 y.o. Female) Treating RN: Curtis Sites Primary Care Physician: SYSTEM, PCP Other Clinician: Referring Physician: Treating Physician/Extender: Elayne Snare in Treatment: 5 Vital Signs Time Taken: 15:14 Pulse (bpm): 83 Height (in): 64 Respiratory Rate (breaths/min): 16 Weight (lbs): 153 Blood Pressure (mmHg): 120/61 Body Mass Index (BMI): 26.3 Reference Range: 80 - 120 mg / dl Electronic Signature(s) Signed: 01/08/2016 5:21:35 PM By: Curtis Sites Entered By: Curtis Sites on 01/08/2016 15:14:47

## 2016-01-09 NOTE — Progress Notes (Addendum)
[name] ([id number]) Visit Report for 01/08/2016 Chief Complaint Document Details Patient Name: [name], [name] Date of Service: 01/08/2016 3:00 PM Medical Record Number: [id number] Patient Account Number: [id number] Date of Birth/Sex: 1929-05-22 (82 y.o. Female) Treating RN: Curtis Sites Primary Care Physician: SYSTEM, PCP Other Clinician: Referring Physician: Treating Physician/Extender: Elayne Snare in Treatment: 5 Information Obtained from: Patient Chief Complaint Patient seen for complaints of Non-Healing Wound to the right upper arm laterally near the elbow for about a month. She also has some skin changes on her left heel. Electronic Signature(s) Signed: 01/08/2016 4:49:55 PM By: Ardath Sax MD Entered By: Ardath Sax on 01/08/2016 16:49:55 [name] ([id number]) -------------------------------------------------------------------------------- Debridement Details Patient Name: [name] Date of Service: 01/08/2016 3:00 PM Medical Record Number: [id number] Patient Account Number: [id number] Date of Birth/Sex: 1929-03-02 (82 y.o. Female) Treating RN: Curtis Sites Primary Care Physician: SYSTEM, PCP Other Clinician: Referring Physician: Treating Physician/Extender: Elayne Snare in Treatment: 5 Debridement Performed for Wound #2 Left Calcaneus Assessment: Performed By: Physician Ardath Sax, MD Debridement: Debridement Pre-procedure Yes Verification/Time Out Taken: Start Time: 15:42 Pain Control: Lidocaine 4% Topical Solution Level: Skin/Subcutaneous Tissue Total Area Debrided (L x 2.4 (cm) x 1.2 (cm) = 2.88 (cm) W): Tissue and other Viable, Non-Viable, Fibrin/Slough, Subcutaneous material debrided: Instrument: Curette Bleeding: Minimum Hemostasis Achieved: Pressure End Time: 15:44 Procedural Pain: 0 Post Procedural Pain: 0 Response to Treatment: Procedure was tolerated well Post Debridement Measurements of Total Wound Length: (cm)  2.4 Stage: Unstageable/Unclassified Width: (cm) 1.2 Depth: (cm) 0.3 Volume: (cm) 0.679 Post Procedure Diagnosis Same as Pre-procedure Electronic Signature(s) Signed: 01/08/2016 5:21:35 PM By: Curtis Sites Signed: 01/09/2016 1:16:39 PM By: Ardath Sax MD Entered By: Curtis Sites on 01/08/2016 15:46:46 [name] ([id number]) -------------------------------------------------------------------------------- HPI Details Patient Name: [name] Date of Service: 01/08/2016 3:00 PM Medical Record Number: [id number] Patient Account Number: [id number] Date of Birth/Sex: 1929-05-14 (82 y.o. Female) Treating RN: Curtis Sites Primary Care Physician: SYSTEM, PCP Other Clinician: Referring Physician: Treating Physician/Extender: Elayne Snare in Treatment: 5 History of Present Illness Location: wound on her right arm laterally near the elbow Quality: Patient reports experiencing a dull pain to affected area(s). Severity: Patient states wound are getting worse. Duration: Patient has had the wound for < 4 weeks prior to presenting for treatment Timing: Pain in wound is Intermittent (comes and goes Context: The wound appeared gradually over time Modifying Factors: Other treatment(s) tried include:local care with dressing changes and has recently been on clindamycin Associated Signs and Symptoms: Patient reports having increase swelling all over the body including upper arms and legs. HPI Description: 82 year old patient was been referred to [place] for a ulcerated area on her right elbow. She has a past medical history of CHF, atrial fibrillation, hypertension, gait instability, bilateral lower extremity edema, morbid obesity, altered mental status. She has never been a smoker. her cardiologist is Dr. Julien Nordmann, who last saw her in March 2017.he has been seeing her with a history of hypertension, gait instability, cognitive impairment, A. fib with RVR and chronic systolic  CHF. she is on Eliquis 5 mg twice a day and for the bilateral lower extremity lymphedema he had recommended elevation and compression hose. 12/11/2015 -- x-ray of the right elbow shows no evidence of fracture or bony pathology and the joint spaces are intact. The patient has held her anticoagulation for the last 72 hours. 12/18/2015 -- the patient was seen in the ER for fluid overload and breathlessness and appropriate management was done including increasing her dosage of  Lasix after an appropriate workup. Details of this have been noted from the ER record -- chest x-ray did not reveal any acute abnormality and the labs were reviewed. She was put on Lasix 40 mg twice a day for 3 days and she will be on a steroid taper. She was also advised breathing treatments twice a day and to follow-up with her cardiologist. 12/25/2015 -- she continues to have extensive lower extremity edema and has several new areas on both lower legs plus the left buttock and left elbow. These are all new since last week. Electronic Signature(s) Signed: 01/08/2016 4:50:07 PM By: Ardath Sax MD Entered By: Ardath Sax on 01/08/2016 16:50:06 [name] ([id number]) -------------------------------------------------------------------------------- Physical Exam Details Patient Name: [name] Date of Service: 01/08/2016 3:00 PM Medical Record Number: [id number] Patient Account Number: [id number] Date of Birth/Sex: 1929-02-23 (82 y.o. Female) Treating RN: Curtis Sites Primary Care Physician: SYSTEM, PCP Other Clinician: Referring Physician: Treating Physician/Extender: Elayne Snare in Treatment: 5 Electronic Signature(s) Signed: 01/08/2016 4:50:13 PM By: Ardath Sax MD Entered By: Ardath Sax on 01/08/2016 16:50:13 [name] ([id number]) -------------------------------------------------------------------------------- Physician Orders Details Patient Name: [name] Date of Service: 01/08/2016  3:00 PM Medical Record Number: [id number] Patient Account Number: [id number] Date of Birth/Sex: 09-Dec-1928 (82 y.o. Female) Treating RN: Curtis Sites Primary Care Physician: SYSTEM, PCP Other Clinician: Referring Physician: Treating Physician/Extender: Elayne Snare in Treatment: 5 Verbal / Phone Orders: Yes Clinician: Curtis Sites Read Back and Verified: Yes Diagnosis Coding Wound Cleansing Wound #1 Right Elbow o Clean wound with Normal Saline. Wound #2 Left Calcaneus o Clean wound with Normal Saline. Wound #5 Left Lower Leg o Clean wound with Normal Saline. Wound #6 Left,Medial Metatarsal head first o Clean wound with Normal Saline. Wound #8 Left,Medial Gluteus o Clean wound with Normal Saline. Anesthetic Wound #1 Right Elbow o Topical Lidocaine 4% cream applied to wound bed prior to debridement - for clinic use Wound #2 Left Calcaneus o Topical Lidocaine 4% cream applied to wound bed prior to debridement - for clinic use Wound #5 Left Lower Leg o Topical Lidocaine 4% cream applied to wound bed prior to debridement - for clinic use Wound #6 Left,Medial Metatarsal head first o Topical Lidocaine 4% cream applied to wound bed prior to debridement - for clinic use Wound #8 Left,Medial Gluteus o Topical Lidocaine 4% cream applied to wound bed prior to debridement - for clinic use Skin Barriers/Peri-Wound Care Wound #1 Right Elbow o Skin Prep Wound #2 Left Calcaneus Bernat, Masey ([id number]) o Skin Prep Wound #5 Left Lower Leg o Skin Prep Wound #6 Left,Medial Metatarsal head first o Skin Prep Wound #8 Left,Medial Gluteus o Skin Prep Primary Wound Dressing Wound #1 Right Elbow o Santyl Ointment Wound #2 Left Calcaneus o Santyl Ointment Wound #5 Left Lower Leg o Boardered Foam Dressing Wound #6 Left,Medial Metatarsal head first o Boardered Foam Dressing Wound #8 Left,Medial Gluteus o Boardered Foam Dressing Secondary  Dressing Wound #1 Right Elbow o Boardered Foam Dressing Wound #2 Left Calcaneus o Boardered Foam Dressing Dressing Change Frequency Wound #1 Right Elbow o Change dressing every day. Wound #2 Left Calcaneus o Change dressing every day. Wound #5 Left Lower Leg o Change dressing every other day. Wound #6 Left,Medial Metatarsal head first o Change dressing every other day. Wound #8 Left,Medial Gluteus Glasner, Bernadetta ([id number]) o Change dressing every other day. Follow-up Appointments Wound #1 Right Elbow o Return Appointment in 1 week. Wound #2 Left Calcaneus o Return Appointment in 1 week. Wound #  5 Left Lower Leg o Return Appointment in 1 week. Wound #6 Left,Medial Metatarsal head first o Return Appointment in 1 week. Wound #8 Left,Medial Gluteus o Return Appointment in 1 week. Edema Control Wound #1 Right Elbow o Elevate legs to the level of the heart and pump ankles as often as possible Wound #2 Left Calcaneus o Elevate legs to the level of the heart and pump ankles as often as possible Wound #5 Left Lower Leg o Elevate legs to the level of the heart and pump ankles as often as possible Wound #6 Left,Medial Metatarsal head first o Elevate legs to the level of the heart and pump ankles as often as possible Wound #8 Left,Medial Gluteus o Elevate legs to the level of the heart and pump ankles as often as possible Off-Loading Wound #1 Right Elbow o Turn and reposition every 2 hours Wound #2 Left Calcaneus o Turn and reposition every 2 hours Wound #5 Left Lower Leg o Turn and reposition every 2 hours Wound #6 Left,Medial Metatarsal head first o Turn and reposition every 2 hours Wound #8 Left,Medial Gluteus Wisecup, Lashonta ([id number]) o Turn and reposition every 2 hours Additional Orders / Instructions Wound #1 Right Elbow o Increase protein intake. Wound #2 Left Calcaneus o Increase protein intake. Wound #5 Left Lower  Leg o Increase protein intake. Wound #6 Left,Medial Metatarsal head first o Increase protein intake. Wound #8 Left,Medial Gluteus o Increase protein intake. Home Health Wound #1 Right Elbow o Continue Home Health Visits - [facility] o Home Health Nurse may visit PRN to address patientos wound care needs. o FACE TO FACE ENCOUNTER: MEDICARE and MEDICAID PATIENTS: I certify that this patient is under my care and that I had a face-to-face encounter that meets the physician face-to-face encounter requirements with this patient on this date. The encounter with the patient was in whole or in part for the following MEDICAL CONDITION: (primary reason for Home Healthcare) MEDICAL NECESSITY: I certify, that based on my findings, NURSING services are a medically necessary home health service. HOME BOUND STATUS: I certify that my clinical findings support that this patient is homebound (i.e., Due to illness or injury, pt requires aid of supportive devices such as crutches, cane, wheelchairs, walkers, the use of special transportation or the assistance of another person to leave their place of residence. There is a normal inability to leave the home and doing so requires considerable and taxing effort. Other absences are for medical reasons / religious services and are infrequent or of short duration when for other reasons). o If current dressing causes regression in wound condition, may D/C ordered dressing product/s and apply Normal Saline Moist Dressing daily until next Wound Healing Center / Other MD appointment. Notify Wound Healing Center of regression in wound condition at [phone]. o Please direct any NON-WOUND related issues/requests for orders to patient's Primary Care Physician Wound #2 Left Calcaneus o Continue Home Health Visits - [facility] o Home Health Nurse may visit PRN to address patientos wound care needs. o FACE TO FACE ENCOUNTER: MEDICARE and MEDICAID  PATIENTS: I certify that this patient is under my care and that I had a face-to-face encounter that meets the physician face-to-face encounter requirements with this patient on this date. The encounter with the patient was in whole or in part for the following MEDICAL CONDITION: (primary reason for Home Healthcare) MEDICAL NECESSITY: I certify, that based on my findings, NURSING services are a medically necessary home health service. HOME BOUND STATUS: I certify that  my clinical findings Pinkard, Roselani ([id number]) support that this patient is homebound (i.e., Due to illness or injury, pt requires aid of supportive devices such as crutches, cane, wheelchairs, walkers, the use of special transportation or the assistance of another person to leave their place of residence. There is a normal inability to leave the home and doing so requires considerable and taxing effort. Other absences are for medical reasons / religious services and are infrequent or of short duration when for other reasons). o If current dressing causes regression in wound condition, may D/C ordered dressing product/s and apply Normal Saline Moist Dressing daily until next Wound Healing Center / Other MD appointment. Notify Wound Healing Center of regression in wound condition at [phone]. o Please direct any NON-WOUND related issues/requests for orders to patient's Primary Care Physician Wound #5 Left Lower Leg o Continue Home Health Visits - [facility] o Home Health Nurse may visit PRN to address patientos wound care needs. o FACE TO FACE ENCOUNTER: MEDICARE and MEDICAID PATIENTS: I certify that this patient is under my care and that I had a face-to-face encounter that meets the physician face-to-face encounter requirements with this patient on this date. The encounter with the patient was in whole or in part for the following MEDICAL CONDITION: (primary reason for Home Healthcare) MEDICAL NECESSITY: I certify,  that based on my findings, NURSING services are a medically necessary home health service. HOME BOUND STATUS: I certify that my clinical findings support that this patient is homebound (i.e., Due to illness or injury, pt requires aid of supportive devices such as crutches, cane, wheelchairs, walkers, the use of special transportation or the assistance of another person to leave their place of residence. There is a normal inability to leave the home and doing so requires considerable and taxing effort. Other absences are for medical reasons / religious services and are infrequent or of short duration when for other reasons). o If current dressing causes regression in wound condition, may D/C ordered dressing product/s and apply Normal Saline Moist Dressing daily until next Wound Healing Center / Other MD appointment. Notify Wound Healing Center of regression in wound condition at [phone]. o Please direct any NON-WOUND related issues/requests for orders to patient's Primary Care Physician Wound #6 Left,Medial Metatarsal head first o Continue Home Health Visits - [facility] o Home Health Nurse may visit PRN to address patientos wound care needs. o FACE TO FACE ENCOUNTER: MEDICARE and MEDICAID PATIENTS: I certify that this patient is under my care and that I had a face-to-face encounter that meets the physician face-to-face encounter requirements with this patient on this date. The encounter with the patient was in whole or in part for the following MEDICAL CONDITION: (primary reason for Home Healthcare) MEDICAL NECESSITY: I certify, that based on my findings, NURSING services are a medically necessary home health service. HOME BOUND STATUS: I certify that my clinical findings support that this patient is homebound (i.e., Due to illness or injury, pt requires aid of supportive devices such as crutches, cane, wheelchairs, walkers, the use of special transportation or the assistance  of another person to leave their place of residence. There is a normal inability to leave the home and doing so requires considerable and taxing effort. Other absences are for medical reasons / religious services and are infrequent or of short duration when for other reasons). Anita BubaJOBE, Zannie ([id number]) o If current dressing causes regression in wound condition, may D/C ordered dressing product/s and apply Normal Saline Moist Dressing daily until  next Wound Healing Center / Other MD appointment. Notify Wound Healing Center of regression in wound condition at [phone]. o Please direct any NON-WOUND related issues/requests for orders to patient's Primary Care Physician Wound #8 Left,Medial Gluteus o Continue Home Health Visits - [facility] o Home Health Nurse may visit PRN to address patientos wound care needs. o FACE TO FACE ENCOUNTER: MEDICARE and MEDICAID PATIENTS: I certify that this patient is under my care and that I had a face-to-face encounter that meets the physician face-to-face encounter requirements with this patient on this date. The encounter with the patient was in whole or in part for the following MEDICAL CONDITION: (primary reason for Home Healthcare) MEDICAL NECESSITY: I certify, that based on my findings, NURSING services are a medically necessary home health service. HOME BOUND STATUS: I certify that my clinical findings support that this patient is homebound (i.e., Due to illness or injury, pt requires aid of supportive devices such as crutches, cane, wheelchairs, walkers, the use of special transportation or the assistance of another person to leave their place of residence. There is a normal inability to leave the home and doing so requires considerable and taxing effort. Other absences are for medical reasons / religious services and are infrequent or of short duration when for other reasons). o If current dressing causes regression in wound condition, may  D/C ordered dressing product/s and apply Normal Saline Moist Dressing daily until next Wound Healing Center / Other MD appointment. Notify Wound Healing Center of regression in wound condition at [phone]. o Please direct any NON-WOUND related issues/requests for orders to patient's Primary Care Physician Electronic Signature(s) Signed: 01/08/2016 5:21:35 PM By: Curtis Sites Signed: 01/09/2016 1:16:39 PM By: Ardath Sax MD Entered By: Curtis Sites on 01/08/2016 15:56:29 [name] ([id number]) -------------------------------------------------------------------------------- Problem List Details Patient Name: [name] Date of Service: 01/08/2016 3:00 PM Medical Record Number: [id number] Patient Account Number: [id number] Date of Birth/Sex: 24-Dec-1928 (82 y.o. Female) Treating RN: Curtis Sites Primary Care Physician: SYSTEM, PCP Other Clinician: Referring Physician: Treating Physician/Extender: Elayne Snare in Treatment: 5 Active Problems ICD-10 Encounter Code Description Active Date Diagnosis S41.101A Unspecified open wound of right upper arm, initial 12/04/2015 Yes encounter L89.629 Pressure ulcer of left heel, unspecified stage 12/04/2015 Yes I50.40 Unspecified combined systolic (congestive) and diastolic 12/04/2015 Yes (congestive) heart failure I89.0 Lymphedema, not elsewhere classified 12/04/2015 Yes L89.322 Pressure ulcer of left buttock, stage 2 12/25/2015 Yes S41.102A Unspecified open wound of left upper arm, initial 12/25/2015 Yes encounter Inactive Problems Resolved Problems Electronic Signature(s) Signed: 01/08/2016 4:49:45 PM By: Ardath Sax MD Entered By: Ardath Sax on 01/08/2016 16:49:44 [name] ([id number]) -------------------------------------------------------------------------------- Progress Note Details Patient Name: [name] Date of Service: 01/08/2016 3:00 PM Medical Record Number: [id number] Patient Account Number:  [id number] Date of Birth/Sex: Aug 30, 1928 (82 y.o. Female) Treating RN: Curtis Sites Primary Care Physician: SYSTEM, PCP Other Clinician: Referring Physician: Treating Physician/Extender: Elayne Snare in Treatment: 5 Subjective Chief Complaint Information obtained from Patient Patient seen for complaints of Non-Healing Wound to the right upper arm laterally near the elbow for about a month. She also has some skin changes on her left heel. History of Present Illness (HPI) The following HPI elements were documented for the patient's wound: Location: wound on her right arm laterally near the elbow Quality: Patient reports experiencing a dull pain to affected area(s). Severity: Patient states wound are getting worse. Duration: Patient has had the wound for < 4 weeks prior to presenting for treatment Timing: Pain in wound is  Intermittent (comes and goes Context: The wound appeared gradually over time Modifying Factors: Other treatment(s) tried include:local care with dressing changes and has recently been on clindamycin Associated Signs and Symptoms: Patient reports having increase swelling all over the body including upper arms and legs. 82 year old patient was been referred to [place] for a ulcerated area on her right elbow. She has a past medical history of CHF, atrial fibrillation, hypertension, gait instability, bilateral lower extremity edema, morbid obesity, altered mental status. She has never been a smoker. her cardiologist is Dr. Julien Nordmann, who last saw her in March 2017.he has been seeing her with a history of hypertension, gait instability, cognitive impairment, A. fib with RVR and chronic systolic CHF. she is on Eliquis 5 mg twice a day and for the bilateral lower extremity lymphedema he had recommended elevation and compression hose. 12/11/2015 -- x-ray of the right elbow shows no evidence of fracture or bony pathology and the joint spaces are intact. The patient has  held her anticoagulation for the last 72 hours. 12/18/2015 -- the patient was seen in the ER for fluid overload and breathlessness and appropriate management was done including increasing her dosage of Lasix after an appropriate workup. Details of this have been noted from the ER record -- chest x-ray did not reveal any acute abnormality and the labs were reviewed. She was put on Lasix 40 mg twice a day for 3 days and she will be on a steroid taper. She was also advised breathing treatments twice a day and to follow-up with her cardiologist. 12/25/2015 -- she continues to have extensive lower extremity edema and has several new areas on both lower legs plus the left buttock and left elbow. These are all new since last week. JERILEE, SPACE ([id number]) Objective Constitutional Vitals Time Taken: 3:14 PM, Height: 64 in, Weight: 153 lbs, BMI: 26.3, Pulse: 83 bpm, Respiratory Rate: 16 breaths/min, Blood Pressure: 120/61 mmHg. Integumentary (Hair, Skin) Wound #1 status is Open. Original cause of wound was Gradually Appeared. The wound is located on the Right Elbow. The wound measures 2.2cm length x 2cm width x 1.2cm depth; 3.456cm^2 area and 4.147cm^3 volume. The wound is limited to skin breakdown. There is no tunneling noted, however, there is undermining starting at 3:00 and ending at 6:00 with a maximum distance of 0.3cm. There is a large amount of serosanguineous drainage noted. The wound margin is flat and intact. There is small (1-33%) pink granulation within the wound bed. There is a large (67-100%) amount of necrotic tissue within the wound bed including Adherent Slough. The periwound skin appearance exhibited: Localized Edema, Moist, Erythema. The surrounding wound skin color is noted with erythema which is circumferential. Periwound temperature was noted as No Abnormality. The periwound has tenderness on palpation. Wound #2 status is Open. Original cause of wound was Gradually Appeared. The  wound is located on the Left Calcaneus. The wound measures 2.4cm length x 1.2cm width x 0.2cm depth; 2.262cm^2 area and 0.452cm^3 volume. The wound is limited to skin breakdown. There is no tunneling or undermining noted. There is a small amount of serosanguineous drainage noted. The wound margin is flat and intact. There is small (1-33%) pink granulation within the wound bed. There is a large (67-100%) amount of necrotic tissue within the wound bed including Eschar and Adherent Slough. The periwound skin appearance exhibited: Localized Edema, Dry/Scaly, Moist. The periwound skin appearance did not exhibit: Callus, Crepitus, Excoriation, Fluctuance, Friable, Induration, Rash, Scarring, Maceration, Atrophie Blanche, Cyanosis, Ecchymosis, Hemosiderin Staining, Mottled,  Pallor, Rubor, Erythema. Periwound temperature was noted as No Abnormality. The periwound has tenderness on palpation. Wound #5 status is Open. Original cause of wound was Gradually Appeared. The wound is located on the Left Lower Leg. The wound measures 0.5cm length x 0.7cm width x 0.1cm depth; 0.275cm^2 area and 0.027cm^3 volume. The wound is limited to skin breakdown. There is no tunneling or undermining noted. There is a large amount of serous drainage noted. The wound margin is indistinct and nonvisible. There is medium (34-66%) red granulation within the wound bed. There is a small (1-33%) amount of necrotic tissue within the wound bed including Adherent Slough. The periwound skin appearance exhibited: Localized Edema, Moist. The periwound skin appearance did not exhibit: Callus, Crepitus, Excoriation, Fluctuance, Friable, Induration, Rash, Scarring, Dry/Scaly, Maceration, Atrophie Blanche, Cyanosis, Ecchymosis, Hemosiderin Staining, Mottled, Pallor, Rubor, Erythema. Periwound temperature was noted as No Abnormality. The periwound has tenderness on palpation. Wound #6 status is Open. Original cause of wound was Pressure Injury.  The wound is located on the Left,Medial Metatarsal head first. The wound measures 0.6cm length x 0.8cm width x 0.1cm depth; 0.377cm^2 area and 0.038cm^3 volume. The wound is limited to skin breakdown. There is no tunneling or undermining noted. There is a small amount of serous drainage noted. The wound margin is distinct with [place], Ether ([id number]) the outline attached to the wound base. There is medium (34-66%) pink, pale granulation within the wound bed. There is a small (1-33%) amount of necrotic tissue within the wound bed including Adherent Slough. The periwound skin appearance exhibited: Dry/Scaly, Erythema. The periwound skin appearance did not exhibit: Localized Edema, Maceration. The surrounding wound skin color is noted with erythema which is circumferential. Periwound temperature was noted as No Abnormality. Wound #7 status is Healed - Epithelialized. Original cause of wound was Trauma. The wound is located on the Left,Lateral Elbow. The wound measures 0cm length x 0cm width x 0cm depth; 0cm^2 area and 0cm^3 volume. Wound #8 status is Open. Original cause of wound was Pressure Injury. The wound is located on the Left,Medial Gluteus. The wound measures 0.7cm length x 0.4cm width x 0.1cm depth; 0.22cm^2 area and 0.022cm^3 volume. The wound is limited to skin breakdown. There is no tunneling or undermining noted. There is a small amount of serous drainage noted. The wound margin is flat and intact. There is medium (34-66%) pink granulation within the wound bed. There is a medium (34-66%) amount of necrotic tissue within the wound bed including Adherent Slough. The periwound skin appearance did not exhibit: Callus, Crepitus, Excoriation, Fluctuance, Friable, Induration, Localized Edema, Rash, Scarring, Dry/Scaly, Maceration, Moist, Atrophie Blanche, Cyanosis, Ecchymosis, Hemosiderin Staining, Mottled, Pallor, Rubor, Erythema. Wound #9 status is Healed - Epithelialized. Original cause  of wound was Pressure Injury. The wound is located on the Right,Posterior Calf. The wound measures 0cm length x 0cm width x 0cm depth; 0cm^2 area and 0cm^3 volume. Assessment Active Problems ICD-10 S41.101A - Unspecified open wound of right upper arm, initial encounter L89.629 - Pressure ulcer of left heel, unspecified stage I50.40 - Unspecified combined systolic (congestive) and diastolic (congestive) heart failure I89.0 - Lymphedema, not elsewhere classified L89.322 - Pressure ulcer of left buttock, stage 2 S41.102A - Unspecified open wound of left upper arm, initial encounter Debrided foot ulcer. Buttucks looks good. Continue Santyl and off load. Procedures Wound #2 Nedeau, Terica ([id number]) Wound #2 is a Pressure Ulcer located on the Left Calcaneus . There was a Skin/Subcutaneous Tissue Debridement ([id number]) debridement with total area of 2.88 sq  cm performed by Ardath Sax, MD. with the following instrument(s): Curette to remove Viable and Non-Viable tissue/material including Fibrin/Slough and Subcutaneous after achieving pain control using Lidocaine 4% Topical Solution. A time out was conducted prior to the start of the procedure. A Minimum amount of bleeding was controlled with Pressure. The procedure was tolerated well with a pain level of 0 throughout and a pain level of 0 following the procedure. Post Debridement Measurements: 2.4cm length x 1.2cm width x 0.3cm depth; 0.679cm^3 volume. Post debridement Stage noted as Unstageable/Unclassified. Post procedure Diagnosis Wound #2: Same as Pre-Procedure Plan Wound Cleansing: Wound #1 Right Elbow: Clean wound with Normal Saline. Wound #2 Left Calcaneus: Clean wound with Normal Saline. Wound #5 Left Lower Leg: Clean wound with Normal Saline. Wound #6 Left,Medial Metatarsal head first: Clean wound with Normal Saline. Wound #8 Left,Medial Gluteus: Clean wound with Normal Saline. Anesthetic: Wound #1 Right Elbow: Topical  Lidocaine 4% cream applied to wound bed prior to debridement - for clinic use Wound #2 Left Calcaneus: Topical Lidocaine 4% cream applied to wound bed prior to debridement - for clinic use Wound #5 Left Lower Leg: Topical Lidocaine 4% cream applied to wound bed prior to debridement - for clinic use Wound #6 Left,Medial Metatarsal head first: Topical Lidocaine 4% cream applied to wound bed prior to debridement - for clinic use Wound #8 Left,Medial Gluteus: Topical Lidocaine 4% cream applied to wound bed prior to debridement - for clinic use Skin Barriers/Peri-Wound Care: Wound #1 Right Elbow: Skin Prep Wound #2 Left Calcaneus: Skin Prep Wound #5 Left Lower Leg: Skin Prep Wound #6 Left,Medial Metatarsal head first: Skin Prep Wound #8 Left,Medial Gluteus: Skin Prep Mclinden, Camri ([id number]) Primary Wound Dressing: Wound #1 Right Elbow: Santyl Ointment Wound #2 Left Calcaneus: Santyl Ointment Wound #5 Left Lower Leg: Boardered Foam Dressing Wound #6 Left,Medial Metatarsal head first: Boardered Foam Dressing Wound #8 Left,Medial Gluteus: Boardered Foam Dressing Secondary Dressing: Wound #1 Right Elbow: Boardered Foam Dressing Wound #2 Left Calcaneus: Boardered Foam Dressing Dressing Change Frequency: Wound #1 Right Elbow: Change dressing every day. Wound #2 Left Calcaneus: Change dressing every day. Wound #5 Left Lower Leg: Change dressing every other day. Wound #6 Left,Medial Metatarsal head first: Change dressing every other day. Wound #8 Left,Medial Gluteus: Change dressing every other day. Follow-up Appointments: Wound #1 Right Elbow: Return Appointment in 1 week. Wound #2 Left Calcaneus: Return Appointment in 1 week. Wound #5 Left Lower Leg: Return Appointment in 1 week. Wound #6 Left,Medial Metatarsal head first: Return Appointment in 1 week. Wound #8 Left,Medial Gluteus: Return Appointment in 1 week. Edema Control: Wound #1 Right Elbow: Elevate legs to  the level of the heart and pump ankles as often as possible Wound #2 Left Calcaneus: Elevate legs to the level of the heart and pump ankles as often as possible Wound #5 Left Lower Leg: Elevate legs to the level of the heart and pump ankles as often as possible Wound #6 Left,Medial Metatarsal head first: Elevate legs to the level of the heart and pump ankles as often as possible Wound #8 Left,Medial Gluteus: Elevate legs to the level of the heart and pump ankles as often as possible Off-Loading: Wound #1 Right Elbow: Asato, Marae ([id number]) Turn and reposition every 2 hours Wound #2 Left Calcaneus: Turn and reposition every 2 hours Wound #5 Left Lower Leg: Turn and reposition every 2 hours Wound #6 Left,Medial Metatarsal head first: Turn and reposition every 2 hours Wound #8 Left,Medial Gluteus: Turn and reposition every 2  hours Additional Orders / Instructions: Wound #1 Right Elbow: Increase protein intake. Wound #2 Left Calcaneus: Increase protein intake. Wound #5 Left Lower Leg: Increase protein intake. Wound #6 Left,Medial Metatarsal head first: Increase protein intake. Wound #8 Left,Medial Gluteus: Increase protein intake. Home Health: Wound #1 Right Elbow: Continue Home Health Visits - [facility] Nurse may visit PRN to address patient s wound care needs. FACE TO FACE ENCOUNTER: MEDICARE and MEDICAID PATIENTS: I certify that this patient is under my care and that I had a face-to-face encounter that meets the physician face-to-face encounter requirements with this patient on this date. The encounter with the patient was in whole or in part for the following MEDICAL CONDITION: (primary reason for Home Healthcare) MEDICAL NECESSITY: I certify, that based on my findings, NURSING services are a medically necessary home health service. HOME BOUND STATUS: I certify that my clinical findings support that this patient is homebound (i.e., Due to illness or injury, pt  requires aid of supportive devices such as crutches, cane, wheelchairs, walkers, the use of special transportation or the assistance of another person to leave their place of residence. There is a normal inability to leave the home and doing so requires considerable and taxing effort. Other absences are for medical reasons / religious services and are infrequent or of short duration when for other reasons). If current dressing causes regression in wound condition, may D/C ordered dressing product/s and apply Normal Saline Moist Dressing daily until next Wound Healing Center / Other MD appointment. Notify Wound Healing Center of regression in wound condition at [phone]. Please direct any NON-WOUND related issues/requests for orders to patient's Primary Care Physician Wound #2 Left Calcaneus: Continue Home Health Visits - [facility] Nurse may visit PRN to address patient s wound care needs. FACE TO FACE ENCOUNTER: MEDICARE and MEDICAID PATIENTS: I certify that this patient is under my care and that I had a face-to-face encounter that meets the physician face-to-face encounter requirements with this patient on this date. The encounter with the patient was in whole or in part for the following MEDICAL CONDITION: (primary reason for Home Healthcare) MEDICAL NECESSITY: I certify, that based on my findings, NURSING services are a medically necessary home health service. HOME BOUND STATUS: I certify that my clinical findings support that this patient is homebound (i.e., Due to illness or injury, pt requires aid of supportive devices such as crutches, cane, wheelchairs, walkers, the use of special transportation or the assistance of another person to leave their place of residence. There is a normal inability to leave the home and doing so requires considerable and taxing effort. Other absences are for medical reasons / religious services and are infrequent or of short duration when for  other reasons). MALEEKA, SABATINO ([id number]) If current dressing causes regression in wound condition, may D/C ordered dressing product/s and apply Normal Saline Moist Dressing daily until next Wound Healing Center / Other MD appointment. Notify Wound Healing Center of regression in wound condition at [phone]. Please direct any NON-WOUND related issues/requests for orders to patient's Primary Care Physician Wound #5 Left Lower Leg: Continue Home Health Visits - [facility] Nurse may visit PRN to address patient s wound care needs. FACE TO FACE ENCOUNTER: MEDICARE and MEDICAID PATIENTS: I certify that this patient is under my care and that I had a face-to-face encounter that meets the physician face-to-face encounter requirements with this patient on this date. The encounter with the patient was in whole or in part  for the following MEDICAL CONDITION: (primary reason for Home Healthcare) MEDICAL NECESSITY: I certify, that based on my findings, NURSING services are a medically necessary home health service. HOME BOUND STATUS: I certify that my clinical findings support that this patient is homebound (i.e., Due to illness or injury, pt requires aid of supportive devices such as crutches, cane, wheelchairs, walkers, the use of special transportation or the assistance of another person to leave their place of residence. There is a normal inability to leave the home and doing so requires considerable and taxing effort. Other absences are for medical reasons / religious services and are infrequent or of short duration when for other reasons). If current dressing causes regression in wound condition, may D/C ordered dressing product/s and apply Normal Saline Moist Dressing daily until next Wound Healing Center / Other MD appointment. Notify Wound Healing Center of regression in wound condition at [phone]. Please direct any NON-WOUND related issues/requests for orders to patient's  Primary Care Physician Wound #6 Left,Medial Metatarsal head first: Continue Home Health Visits - [facility] Nurse may visit PRN to address patient s wound care needs. FACE TO FACE ENCOUNTER: MEDICARE and MEDICAID PATIENTS: I certify that this patient is under my care and that I had a face-to-face encounter that meets the physician face-to-face encounter requirements with this patient on this date. The encounter with the patient was in whole or in part for the following MEDICAL CONDITION: (primary reason for Home Healthcare) MEDICAL NECESSITY: I certify, that based on my findings, NURSING services are a medically necessary home health service. HOME BOUND STATUS: I certify that my clinical findings support that this patient is homebound (i.e., Due to illness or injury, pt requires aid of supportive devices such as crutches, cane, wheelchairs, walkers, the use of special transportation or the assistance of another person to leave their place of residence. There is a normal inability to leave the home and doing so requires considerable and taxing effort. Other absences are for medical reasons / religious services and are infrequent or of short duration when for other reasons). If current dressing causes regression in wound condition, may D/C ordered dressing product/s and apply Normal Saline Moist Dressing daily until next Wound Healing Center / Other MD appointment. Notify Wound Healing Center of regression in wound condition at [phone]. Please direct any NON-WOUND related issues/requests for orders to patient's Primary Care Physician Wound #8 Left,Medial Gluteus: Continue Home Health Visits - [facility] Nurse may visit PRN to address patient s wound care needs. FACE TO FACE ENCOUNTER: MEDICARE and MEDICAID PATIENTS: I certify that this patient is under my care and that I had a face-to-face encounter that meets the physician face-to-face encounter requirements with this  patient on this date. The encounter with the patient was in whole or in part for the following MEDICAL CONDITION: (primary reason for Home Healthcare) MEDICAL NECESSITY: I certify, that based on my findings, NURSING services are a medically necessary home health service. HOME BOUND STATUS: I certify that my clinical findings support that this patient is homebound (i.e., Due to illness or injury, pt requires aid of supportive devices such as crutches, cane, wheelchairs, walkers, the use of special transportation or the assistance of another person to leave their place of residence. There is a normal inability to leave the home and doing so requires considerable and taxing effort. Other absences are for medical reasons / religious services and are infrequent or of short duration when for other reasons). GAELYN, TUKES ([id number])  If current dressing causes regression in wound condition, may D/C ordered dressing product/s and apply Normal Saline Moist Dressing daily until next Wound Healing Center / Other MD appointment. Notify Wound Healing Center of regression in wound condition at [phone]. Please direct any NON-WOUND related issues/requests for orders to patient's Primary Care Physician Follow-Up Appointments: A follow-up appointment should be scheduled. A Patient Clinical Summary of Care was provided to [facility] Electronic Signature(s) Signed: 01/19/2016 11:44:04 AM By: Ardath Sax MD Previous Signature: 01/08/2016 4:51:47 PM Version By: Ardath Sax MD Entered By: Ardath Sax on 01/19/2016 11:44:04 [name] ([id number]) -------------------------------------------------------------------------------- SuperBill Details Patient Name: [name] Date of Service: 01/08/2016 Medical Record Number: [id number] Patient Account Number: [id number] Date of Birth/Sex: 10-13-1928 (82 y.o. Female) Treating RN: Curtis Sites Primary Care Physician: SYSTEM, PCP Other Clinician: Referring  Physician: Treating Physician/Extender: Elayne Snare in Treatment: 5 Diagnosis Coding ICD-10 Codes Code Description S41.101A Unspecified open wound of right upper arm, initial encounter L89.629 Pressure ulcer of left heel, unspecified stage I50.40 Unspecified combined systolic (congestive) and diastolic (congestive) heart failure I89.0 Lymphedema, not elsewhere classified L89.322 Pressure ulcer of left buttock, stage 2 S41.102A Unspecified open wound of left upper arm, initial encounter Facility Procedures CPT4 Code: [id number] Description: 11042 - DEB SUBQ TISSUE 20 SQ CM/< ICD-10 Description Diagnosis L89.629 Pressure ulcer of left heel, unspecified stage Modifier: Quantity: 1 Physician Procedures CPT4 Code: [id number] Description: 99213 - WC PHYS LEVEL 3 - EST PT ICD-10 Description Diagnosis S41.101A Unspecified open wound of right upper arm, initial en Modifier: counter Quantity: 1 CPT4 Code: [id number] Description: 11042 - WC PHYS SUBQ TISS 20 SQ CM ICD-10 Description Diagnosis L89.629 Pressure ulcer of left heel, unspecified stage Modifier: Quantity: 1 Electronic Signature(s) Signed: 01/08/2016 4:53:23 PM By: Ardath Sax MD Entered By: Ardath Sax on 01/08/2016 16:53:23

## 2016-01-15 ENCOUNTER — Encounter: Payer: Medicare Other | Admitting: Surgery

## 2016-01-15 DIAGNOSIS — S41101A Unspecified open wound of right upper arm, initial encounter: Secondary | ICD-10-CM | POA: Diagnosis not present

## 2016-01-16 NOTE — Progress Notes (Signed)
[name], [name] ([id number]) Visit Report for 01/15/2016 Arrival Information Details Patient Name: [name], [name] Date of Service: 01/15/2016 10:45 AM Medical Record Number: [id number] Patient Account Number: [id number] Date of Birth/Sex: 09-15-1928 (82 y.o. Female) Treating RN: Ashok CordiaPinkerton, Debi Primary Care Physician: SYSTEM, PCP Other Clinician: Referring Physician: Treating Physician/Extender: Rudene ReBritto, Errol Weeks in Treatment: 6 Visit Information History Since Last Visit All ordered tests and consults were completed: No Patient Arrived: Wheel Chair Added or deleted any medications: No Arrival Time: 10:59 Any new allergies or adverse reactions: No Accompanied By: sister Had a fall or experienced change in No Transfer Assistance: Nurse, adultHoyer Lift activities of daily living that may affect Patient Identification Verified: Yes risk of falls: Secondary Verification Process Yes Signs or symptoms of abuse/neglect since last No Completed: visito Patient Requires Transmission- No Hospitalized since last visit: No Based Precautions: Pain Present Now: No Patient Has Alerts: Yes Patient Alerts: Patient on Blood Thinner ABI: (L) 1.56 Eliquis Electronic Signature(s) Signed: 01/15/2016 4:39:30 PM By: Alejandro MullingPinkerton, Debra Entered By: Alejandro MullingPinkerton, Debra on 01/15/2016 10:59:55 [name], [name] ([id number]) -------------------------------------------------------------------------------- Encounter Discharge Information Details Patient Name: [name], [name] Date of Service: 01/15/2016 10:45 AM Medical Record Number: [id number] Patient Account Number: [id number] Date of Birth/Sex: 09-15-1928 (82 y.o. Female) Treating RN: Ashok CordiaPinkerton, Debi Primary Care Physician: SYSTEM, PCP Other Clinician: Referring Physician: Treating Physician/Extender: Rudene ReBritto, Errol Weeks in Treatment: 6 Encounter Discharge Information Items Discharge Pain Level: 0 Discharge Condition: Stable Ambulatory Status: Wheelchair Discharge  Destination: Nursing Home Transportation: Other Accompanied By: sister Schedule Follow-up Appointment: Yes Medication Reconciliation completed and provided to Patient/Care Yes Maniya Donovan: Provided on Clinical Summary of Care: 01/15/2016 Form Type Recipient Paper Patient MJ Electronic Signature(s) Signed: 01/15/2016 12:01:24 PM By: Gwenlyn PerkingMoore, Shelia Entered By: Gwenlyn PerkingMoore, Shelia on 01/15/2016 12:01:24 [name], [name] ([id number]) -------------------------------------------------------------------------------- Lower Extremity Assessment Details Patient Name: [name], [name] Date of Service: 01/15/2016 10:45 AM Medical Record Number: [id number] Patient Account Number: [id number] Date of Birth/Sex: 09-15-1928 (82 y.o. Female) Treating RN: Ashok CordiaPinkerton, Debi Primary Care Physician: SYSTEM, PCP Other Clinician: Referring Physician: Treating Physician/Extender: Rudene ReBritto, Errol Weeks in Treatment: 6 Vascular Assessment Pulses: Posterior Tibial Dorsalis Pedis Palpable: [Right:No] Doppler: [Right:Monophasic] Extremity colors, hair growth, and conditions: Extremity Color: [Right:Mottled] Temperature of Extremity: [Right:Warm] Capillary Refill: [Right:< 3 seconds] Toe Nail Assessment Left: Right: Thick: Yes Discolored: Yes Deformed: No Improper Length and Hygiene: No Electronic Signature(s) Signed: 01/15/2016 4:39:30 PM By: Alejandro MullingPinkerton, Debra Entered By: Alejandro MullingPinkerton, Debra on 01/15/2016 16:34:00 Winchel, Johnny BridgeMARTHA ([id number]) -------------------------------------------------------------------------------- Multi Wound Chart Details Patient Name: [name], [name] Date of Service: 01/15/2016 10:45 AM Medical Record Number: [id number] Patient Account Number: [id number] Date of Birth/Sex: 09-15-1928 (82 y.o. Female) Treating RN: Ashok CordiaPinkerton, Debi Primary Care Physician: SYSTEM, PCP Other Clinician: Referring Physician: Treating Physician/Extender: Rudene ReBritto, Errol Weeks in Treatment: 6 Vital Signs Height(in):  64 Pulse(bpm): 87 Weight(lbs): 153 Blood Pressure 135/71 (mmHg): Body Mass Index(BMI): 26 Temperature(F): 97.8 Respiratory Rate 16 (breaths/min): Photos: [1:No Photos] [10:No Photos] [2:No Photos] Wound Location: [1:Right Elbow] [10:Left Elbow] [2:Left Calcaneus] Wounding Event: [1:Gradually Appeared] [10:Trauma] [2:Gradually Appeared] Primary Etiology: [1:Infection - not elsewhere classified] [10:Trauma, Other] [2:Pressure Ulcer] Comorbid History: [1:Cataracts, Lymphedema, Aspiration, Chronic Obstructive Pulmonary Disease (COPD), Arrhythmia, Congestive Heart Failure, Hypertension, Peripheral Venous Disease, Osteoarthritis, Dementia] [10:Cataracts, Lymphedema, Aspiration, Chronic  Obstructive Pulmonary Disease (COPD), Arrhythmia, Congestive Heart Failure, Hypertension, Peripheral Venous Disease, Osteoarthritis, Dementia] [2:Cataracts, Lymphedema, Aspiration, Chronic Obstructive Pulmonary Disease (COPD), Arrhythmia, Congestive  Heart Failure, Hypertension, Peripheral Venous Disease, Osteoarthritis, Dementia] Date Acquired: [1:11/14/2015] [10:01/14/2016] [2:10/30/2015] Weeks of Treatment: [1:6] [10:0] [2:5] Wound Status: [1:Open] [10:Open] [2:Open] Measurements L x  W x D 2.2x2x1.1 [10:2.5x0.8x0.1] [2:1.4x3.2x0.2] (cm) Area (cm) : [1:3.456] [10:1.571] [2:3.519] Volume (cm) : [1:3.801] [10:0.157] [2:0.704] % Reduction in Area: [1:68.60%] [10:N/A] [2:25.30%] % Reduction in Volume: 79.70% [10:N/A] [2:-49.50%] Starting Position 1 12 (o'clock): Ending Position 1 (o'clock): Maximum Distance 1 1.1 (cm): Undermining: [1:Yes] [10:No] [2:No] Classification: Full Thickness Without Partial Thickness Unstageable/Unclassified Exposed Support Structures Exudate Amount: Large Large Large Exudate Type: Serous Serosanguineous Serous Exudate Color: amber red, brown amber Foul Odor After Yes No No Cleansing: Odor Anticipated Due to No N/A N/A Product Use: Wound Margin: Flat and Intact Distinct,  outline attached Flat and Intact Granulation Amount: Large (67-100%) None Present (0%) Small (1-33%) Granulation Quality: Pink N/A Pink Necrotic Amount: Small (1-33%) Large (67-100%) Large (67-100%) Necrotic Tissue: Adherent Slough Eschar Eschar, Adherent Slough Exposed Structures: Fascia: No Fascia: No Fascia: No Fat: No Fat: No Fat: No Tendon: No Tendon: No Tendon: No Muscle: No Muscle: No Muscle: No Joint: No Joint: No Joint: No Bone: No Bone: No Bone: No Limited to Skin Limited to Skin Limited to Skin Breakdown Breakdown Breakdown Epithelialization: None N/A None Debridement: N/A N/A Debridement ([id number]) Time-Out Taken: N/A N/A Yes Pain Control: N/A N/A Other Tissue Debrided: N/A N/A Fibrin/Slough, Exudates, Skin, Subcutaneous Level: N/A N/A Skin/Subcutaneous Tissue Debridement Area (sq N/A N/A 4.48 cm): Instrument: N/A N/A Forceps, Scissors Bleeding: N/A N/A Minimum Hemostasis Achieved: N/A N/A Pressure Procedural Pain: N/A N/A 0 Post Procedural Pain: N/A N/A 0 Debridement Treatment N/A N/A Procedure was tolerated Response: well Post Debridement N/A N/A 3x4.2x0.2 Measurements L x W x D (cm) Post Debridement N/A N/A 1.979 Volume: (cm) Post Debridement N/A N/A Unstageable/Unclassified Stage: Periwound Skin Texture: Edema: Yes Edema: Yes Kelm, Yasira ([id number]) Edema: Yes Excoriation: No Induration: No Callus: No Crepitus: No Fluctuance: No Friable: No Rash: No Scarring: No Periwound Skin Moist: Yes Moist: Yes Maceration: Yes Moisture: Moist: Yes Dry/Scaly: No Periwound Skin Color: Erythema: Yes Erythema: Yes Atrophie Blanche: No Cyanosis: No Ecchymosis: No Erythema: No Hemosiderin Staining: No Mottled: No Pallor: No Rubor: No Erythema Location: Circumferential Circumferential N/A Erythema Change: N/A N/A N/A Temperature: No Abnormality No Abnormality No Abnormality Tenderness on Yes Yes Yes Palpation: Wound  Preparation: Ulcer Cleansing: Ulcer Cleansing: Ulcer Cleansing: Rinsed/Irrigated with Rinsed/Irrigated with Rinsed/Irrigated with Saline Saline Saline Topical Anesthetic Topical Anesthetic Topical Anesthetic Applied: Other: lidocaine Applied: Other: lidocaine Applied: Other: lidocaine 4% 4% 4% Procedures Performed: N/A N/A Debridement Wound Number: [id number]  Photos: No Photos No Photos No Photos Wound Location: Left Lower Leg Left Metatarsal head first - Left Gluteus - Medial Medial Wounding Event: Gradually Appeared Pressure Injury Pressure Injury Primary Etiology: Pressure Ulcer Pressure Ulcer Pressure Ulcer Comorbid History: Cataracts, Lymphedema, Cataracts, Lymphedema, Cataracts, Lymphedema, Aspiration, Chronic Aspiration, Chronic Aspiration, Chronic Obstructive Pulmonary Obstructive Pulmonary Obstructive Pulmonary Disease (COPD), Disease (COPD), Disease (COPD), Arrhythmia, Congestive Arrhythmia, Congestive Arrhythmia, Congestive Heart Failure, Heart Failure, Heart Failure, Hypertension, Peripheral Hypertension, Peripheral Hypertension, Peripheral Venous Disease, Venous Disease, Venous Disease, Osteoarthritis, Dementia Osteoarthritis, Dementia Osteoarthritis, Dementia Date Acquired: 12/13/2015 12/25/2015 12/11/2015 TITIANNA, LOOMIS ([id number]) Weeks of Treatment: 4 3 3  Wound Status: Open Open Open Measurements L x W x D 0.3x15x0.1 0.1x0.1x0.1 1x0.3x0.1 (cm) Area (cm) : 3.534 0.008 0.236 Volume (cm) : 0.353 0.001 0.024 % Reduction in Area: 0.00% 98.70% 69.90% % Reduction in Volume: 0.00% 98.40% 69.60% Undermining: No N/A No Classification: Category/Stage II Category/Stage II Category/Stage II Exudate Amount: None Present Small Small Exudate Type: N/A Serosanguineous Serosanguineous Exudate Color: N/A red, brown red, brown Foul Odor After  No No No Cleansing: Odor Anticipated Due to N/A N/A N/A Product Use: Wound Margin: Indistinct, nonvisible Distinct, outline attached Flat and  Intact Granulation Amount: None Present (0%) Medium (34-66%) Medium (34-66%) Granulation Quality: N/A Pink, Pale Pink Necrotic Amount: Large (67-100%) Small (1-33%) Medium (34-66%) Necrotic Tissue: Eschar Adherent Slough Adherent Slough Exposed Structures: Fascia: No Fascia: No Fascia: No Fat: No Fat: No Fat: No Tendon: No Tendon: No Tendon: No Muscle: No Muscle: No Muscle: No Joint: No Joint: No Joint: No Bone: No Bone: No Bone: No Limited to Skin Limited to Skin Limited to Skin Breakdown Breakdown Breakdown Epithelialization: Small (1-33%) Medium (34-66%) N/A Debridement: N/A N/A N/A Time-Out Taken: N/A N/A N/A Pain Control: N/A N/A N/A Tissue Debrided: N/A N/A N/A Level: N/A N/A N/A Debridement Area (sq N/A N/A N/A cm): Instrument: N/A N/A N/A Bleeding: N/A N/A N/A Hemostasis Achieved: N/A N/A N/A Procedural Pain: N/A N/A N/A Post Procedural Pain: N/A N/A N/A Debridement Treatment N/A N/A N/A Response: Post Debridement N/A N/A N/A Measurements L x W x D (cm) N/A N/A N/A Fano, Dwanda ([id number]) Post Debridement Volume: (cm) Post Debridement N/A N/A N/A Stage: Periwound Skin Texture: Edema: Yes Edema: No Edema: No Excoriation: No Excoriation: No Induration: No Induration: No Callus: No Callus: No Crepitus: No Crepitus: No Fluctuance: No Fluctuance: No Friable: No Friable: No Rash: No Rash: No Scarring: No Scarring: No Periwound Skin Moist: Yes Dry/Scaly: Yes Maceration: No Moisture: Maceration: No Maceration: No Moist: No Dry/Scaly: No Dry/Scaly: No Periwound Skin Color: Atrophie Blanche: No Erythema: Yes Atrophie Blanche: No Cyanosis: No Cyanosis: No Ecchymosis: No Ecchymosis: No Erythema: No Erythema: No Hemosiderin Staining: No Hemosiderin Staining: No Mottled: No Mottled: No Pallor: No Pallor: No Rubor: No Rubor: No Erythema Location: N/A Circumferential N/A Erythema Change: N/A Decreased N/A Temperature: No  Abnormality No Abnormality N/A Tenderness on Yes No No Palpation: Wound Preparation: Ulcer Cleansing: Ulcer Cleansing: Ulcer Cleansing: Rinsed/Irrigated with Rinsed/Irrigated with Rinsed/Irrigated with Saline Saline Saline Topical Anesthetic Topical Anesthetic Topical Anesthetic Applied: Other: lidocaine Applied: Other: lidocaine Applied: Other: lidocaine 4% 4% 4% Procedures Performed: N/A N/A N/A Treatment Notes Electronic Signature(s) Signed: 01/15/2016 4:39:30 PM By: Alejandro Mulling Entered By: Alejandro Mulling on 01/15/2016 11:57:38 [name] ([id number]) -------------------------------------------------------------------------------- Multi-Disciplinary Care Plan Details Patient Name: [name] Date of Service: 01/15/2016 10:45 AM Medical Record Number: [id number] Patient Account Number: [id number] Date of Birth/Sex: 06/24/1929 (82 y.o. Female) Treating RN: Ashok Cordia, Debi Primary Care Physician: SYSTEM, PCP Other Clinician: Referring Physician: Treating Physician/Extender: Rudene Re in Treatment: 6 Active Inactive Abuse / Safety / Falls / Self Care Management Nursing Diagnoses: Potential for falls Goals: Patient will remain injury free Date Initiated: 12/04/2015 Goal Status: Active Interventions: Assess fall risk on admission and as needed Notes: Nutrition Nursing Diagnoses: Potential for alteratiion in Nutrition/Potential for imbalanced nutrition Goals: Patient/caregiver agrees to and verbalizes understanding of need to obtain nutritional consultation Date Initiated: 12/04/2015 Goal Status: Active Interventions: Provide education on nutrition Treatment Activities: Education provided on Nutrition : 01/01/2016 Notes: Orientation to the Wound Care Program Nursing Diagnoses: Knowledge deficit related to the wound healing center program ZALIKA, TIESZEN ([id number]) Goals: Patient/caregiver will verbalize understanding of the Wound Healing Center  Program Date Initiated: 12/04/2015 Goal Status: Active Interventions: Provide education on orientation to the wound center Notes: Venous Leg Ulcer Nursing Diagnoses: Actual venous Insuffiency (use after diagnosis is confirmed) Goals: Verify adequate tissue perfusion prior to therapeutic compression application Date Initiated: 12/04/2015 Goal Status: Active Interventions: Provide education on venous insufficiency Notes:  Wound/Skin Impairment Nursing Diagnoses: Impaired tissue integrity Goals: Ulcer/skin breakdown will heal within 14 weeks Date Initiated: 12/04/2015 Goal Status: Active Interventions: Assess patient/caregiver ability to obtain necessary supplies Notes: Electronic Signature(s) Signed: 01/15/2016 4:39:30 PM By: Alejandro Mulling Entered By: Alejandro Mulling on 01/15/2016 11:57:29 Vieyra, Carrol ([id number]) -------------------------------------------------------------------------------- Pain Assessment Details Patient Name: [name] Date of Service: 01/15/2016 10:45 AM Medical Record Number: [id number] Patient Account Number: [id number] Date of Birth/Sex: 1928-07-05 (82 y.o. Female) Treating RN: Ashok Cordia, Debi Primary Care Physician: SYSTEM, PCP Other Clinician: Referring Physician: Treating Physician/Extender: Rudene Re in Treatment: 6 Active Problems Location of Pain Severity and Description of Pain Patient Has Paino No Site Locations With Dressing Change: No Pain Management and Medication Current Pain Management: Notes Topical or injectable lidocaine is offered to patient for acute pain when surgical debridement is performed. If needed, Patient is instructed to use over the counter pain medication for the following 24-48 hours after debridement. Wound care MDs do not prescribed pain medications. Patient has chronic pain or uncontrolled pain. Patient has been instructed to make an appointment with their Primary Care Physician for pain  management. Electronic Signature(s) Signed: 01/15/2016 4:39:30 PM By: Alejandro Mulling Entered By: Alejandro Mulling on 01/15/2016 11:00:06 [name] ([id number]) -------------------------------------------------------------------------------- Patient/Caregiver Education Details Patient Name: [name] Date of Service: 01/15/2016 10:45 AM Medical Record Number: [id number] Patient Account Number: [id number] Date of Birth/Gender: 12-Nov-1928 (82 y.o. Female) Treating RN: Ashok Cordia, Debi Primary Care Physician: SYSTEM, PCP Other Clinician: Referring Physician: Treating Physician/Extender: Rudene Re in Treatment: 6 Education Assessment Education Provided To: Patient Education Topics Provided Wound/Skin Impairment: Handouts: Other: change dressings as directed Methods: Demonstration, Explain/Verbal Responses: State content correctly Electronic Signature(s) Signed: 01/15/2016 4:39:30 PM By: Alejandro Mulling Entered By: Alejandro Mulling on 01/15/2016 11:29:15 [name] ([id number]) -------------------------------------------------------------------------------- Wound Assessment Details Patient Name: [name] Date of Service: 01/15/2016 10:45 AM Medical Record Number: [id number] Patient Account Number: [id number] Date of Birth/Sex: 1929/01/26 (82 y.o. Female) Treating RN: Ashok Cordia, Debi Primary Care Physician: SYSTEM, PCP Other Clinician: Referring Physician: Treating Physician/Extender: Rudene Re in Treatment: 6 Wound Status Wound Number: 1 Primary Infection - not elsewhere classified Etiology: Wound Location: Right Elbow Wound Open Wounding Event: Gradually Appeared Status: Date Acquired: 11/14/2015 Comorbid Cataracts, Lymphedema, Aspiration, Weeks Of Treatment: 6 History: Chronic Obstructive Pulmonary Disease Clustered Wound: No (COPD), Arrhythmia, Congestive Heart Failure, Hypertension, Peripheral Venous Disease, Osteoarthritis,  Dementia Photos Photo Uploaded By: Alejandro Mulling on 01/15/2016 16:16:26 Wound Measurements Length: (cm) 2.2 Width: (cm) 2 Depth: (cm) 1.1 Area: (cm) 3.456 Volume: (cm) 3.801 % Reduction in Area: 68.6% % Reduction in Volume: 79.7% Epithelialization: None Tunneling: No Undermining: Yes Starting Position (o'clock): 12 Maximum Distance: (cm) 1.1 Wound Description Full Thickness Without Exposed Foul Odor Af Classification: Support Structures Due to Produ Wound Margin: Flat and Intact Exudate Large Amount: Exudate Type: Serous Manfredi, Zanaiya ([id number]) ter Cleansing: Yes ct Use: No Exudate Color: amber Wound Bed Granulation Amount: Large (67-100%) Exposed Structure Granulation Quality: Pink Fascia Exposed: No Necrotic Amount: Small (1-33%) Fat Layer Exposed: No Necrotic Quality: Adherent Slough Tendon Exposed: No Muscle Exposed: No Joint Exposed: No Bone Exposed: No Limited to Skin Breakdown Periwound Skin Texture Texture Color No Abnormalities Noted: No No Abnormalities Noted: No Localized Edema: Yes Erythema: Yes Erythema Location: Circumferential Moisture No Abnormalities Noted: No Temperature / Pain Moist: Yes Temperature: No Abnormality Tenderness on Palpation: Yes Wound Preparation Ulcer Cleansing: Rinsed/Irrigated with Saline Topical Anesthetic Applied: Other: lidocaine 4%, Treatment Notes Wound #1 (Right Elbow) 1. Cleansed with:  Clean wound with Normal Saline 2. Anesthetic Topical Lidocaine 4% cream to wound bed prior to debridement 3. Peri-wound Care: Skin Prep 4. Dressing Applied: Santyl Ointment 5. Secondary Dressing Applied Bordered Foam Dressing Dry Gauze Electronic Signature(s) Signed: 01/15/2016 4:39:30 PM By: Alejandro Mulling Entered By: Alejandro Mulling on 01/15/2016 11:10:22 [name] ([id number]) -------------------------------------------------------------------------------- Wound Assessment Details Patient Name: [name] Date of Service: 01/15/2016 10:45 AM Medical Record Number: [id number] Patient Account Number: [id number] Date of Birth/Sex: [birth year] (82 y.o. Female) Treating RN: Ashok Cordia, Debi Primary Care Physician: SYSTEM, PCP Other Clinician: Referring Physician: Treating Physician/Extender: Rudene Re in Treatment: 6 Wound Status Wound Number: 10 Primary Trauma, Other Etiology: Wound Location: Left Elbow Wound Open Wounding Event: Trauma Status: Date Acquired: 01/14/2016 Comorbid Cataracts, Lymphedema, Aspiration, Weeks Of Treatment: 0 History: Chronic Obstructive Pulmonary Disease Clustered Wound: No (COPD), Arrhythmia, Congestive Heart Failure, Hypertension, Peripheral Venous Disease, Osteoarthritis, Dementia Photos Photo Uploaded By: Alejandro Mulling on 01/15/2016 16:17:09 Wound Measurements Length: (cm) 2.5 Width: (cm) 0.8 Depth: (cm) 0.1 Area: (cm) 1.571 Volume: (cm) 0.157 % Reduction in Area: % Reduction in Volume: Tunneling: No Undermining: No Wound Description Classification: Partial Thickness Wound Margin: Distinct, outline attached Exudate Amount: Large Exudate Type: Serosanguineous Exudate Color: red, brown Foul Odor After Cleansing: No Wound Bed Granulation Amount: None Present (0%) Exposed Structure Necrotic Amount: Large (67-100%) Fascia Exposed: No Zahler, Angles ([id number]) Necrotic Quality: Eschar Fat Layer Exposed: No Tendon Exposed: No Muscle Exposed: No Joint Exposed: No Bone Exposed: No Limited to Skin Breakdown Periwound Skin Texture Texture Color No Abnormalities Noted: No No Abnormalities Noted: No Localized Edema: Yes Erythema: Yes Erythema Location: Circumferential Moisture No Abnormalities Noted: No Temperature / Pain Moist: Yes Temperature: No Abnormality Tenderness on Palpation: Yes Wound Preparation Ulcer Cleansing: Rinsed/Irrigated with Saline Topical Anesthetic Applied: Other: lidocaine 4%, Treatment  Notes Wound #10 (Left Elbow) 1. Cleansed with: Clean wound with Normal Saline 2. Anesthetic Topical Lidocaine 4% cream to wound bed prior to debridement 3. Peri-wound Care: Skin Prep 5. Secondary Dressing Applied Bordered Foam Dressing Electronic Signature(s) Signed: 01/15/2016 4:39:30 PM By: Alejandro Mulling Entered By: Alejandro Mulling on 01/15/2016 11:21:19 [name] ([id number]) -------------------------------------------------------------------------------- Wound Assessment Details Patient Name: [name] Date of Service: 01/15/2016 10:45 AM Medical Record Number: [id number] Patient Account Number: [id number] Date of Birth/Sex: 26-Sep-1928 (82 y.o. Female) Treating RN: Ashok Cordia, Debi Primary Care Physician: SYSTEM, PCP Other Clinician: Referring Physician: Treating Physician/Extender: Rudene Re in Treatment: 6 Wound Status Wound Number: 2 Primary Pressure Ulcer Etiology: Wound Location: Left Calcaneus Wound Open Wounding Event: Gradually Appeared Status: Date Acquired: 10/30/2015 Comorbid Cataracts, Lymphedema, Aspiration, Weeks Of Treatment: 5 History: Chronic Obstructive Pulmonary Disease Clustered Wound: No (COPD), Arrhythmia, Congestive Heart Failure, Hypertension, Peripheral Venous Disease, Osteoarthritis, Dementia Photos Photo Uploaded By: Alejandro Mulling on 01/15/2016 16:17:51 Wound Measurements Length: (cm) 1.4 Width: (cm) 3.2 Depth: (cm) 0.2 Area: (cm) 3.519 Volume: (cm) 0.704 % Reduction in Area: 25.3% % Reduction in Volume: -49.5% Epithelialization: None Tunneling: No Undermining: No Wound Description Classification: Unstageable/Unclassified Wound Margin: Flat and Intact Exudate Amount: Large Exudate Type: Serous Exudate Color: amber Foul Odor After Cleansing: No Wound Bed Granulation Amount: Small (1-33%) Exposed Structure Granulation Quality: Pink Fascia Exposed: No Schissler, Akaisha ([id number]) Necrotic Amount: Large  (67-100%) Fat Layer Exposed: No Necrotic Quality: Eschar, Adherent Slough Tendon Exposed: No Muscle Exposed: No Joint Exposed: No Bone Exposed: No Limited to Skin Breakdown Periwound Skin Texture Texture Color No Abnormalities Noted: No No Abnormalities Noted: No Callus: No Atrophie Blanche: No Crepitus:  No Cyanosis: No Excoriation: No Ecchymosis: No Fluctuance: No Erythema: No Friable: No Hemosiderin Staining: No Induration: No Mottled: No Localized Edema: Yes Pallor: No Rash: No Rubor: No Scarring: No Temperature / Pain Moisture Temperature: No Abnormality No Abnormalities Noted: No Tenderness on Palpation: Yes Dry / Scaly: No Maceration: Yes Moist: Yes Wound Preparation Ulcer Cleansing: Rinsed/Irrigated with Saline Topical Anesthetic Applied: Other: lidocaine 4%, Treatment Notes Wound #2 (Left Calcaneus) 1. Cleansed with: Clean wound with Normal Saline 2. Anesthetic Topical Lidocaine 4% cream to wound bed prior to debridement 3. Peri-wound Care: Skin Prep 4. Dressing Applied: Santyl Ointment 5. Secondary Dressing Applied Bordered Foam Dressing Dry Gauze Electronic Signature(s) Signed: 01/15/2016 4:39:30 PM By: Alejandro Mulling Entered By: Alejandro Mulling on 01/15/2016 11:11:55 LATANIA, BASCOMB ([id number]) ESTEPHANIE, HUBBS ([id number]) -------------------------------------------------------------------------------- Wound Assessment Details Patient Name: [name] Date of Service: 01/15/2016 10:45 AM Medical Record Number: [id number] Patient Account Number: [id number] Date of Birth/Sex: 16-Jul-1928 (82 y.o. Female) Treating RN: Ashok Cordia, Debi Primary Care Physician: SYSTEM, PCP Other Clinician: Referring Physician: Treating Physician/Extender: Rudene Re in Treatment: 6 Wound Status Wound Number: 5 Primary Pressure Ulcer Etiology: Wound Location: Left Lower Leg Wound Open Wounding Event: Gradually Appeared Status: Date Acquired:  12/13/2015 Comorbid Cataracts, Lymphedema, Aspiration, Weeks Of Treatment: 4 History: Chronic Obstructive Pulmonary Disease Clustered Wound: No (COPD), Arrhythmia, Congestive Heart Failure, Hypertension, Peripheral Venous Disease, Osteoarthritis, Dementia Photos Photo Uploaded By: Alejandro Mulling on 01/15/2016 16:18:39 Wound Measurements Length: (cm) 0 % Reduction Width: (cm) 0 % Reduction Depth: (cm) 0 Epithelializ Area: (cm) 0 Tunneling: Volume: (cm) 0 Undermining in Area: 100% in Volume: 100% ation: Small (1-33%) No : No Wound Description Classification: Category/Stage II Wound Margin: Indistinct, nonvisible Exudate Amount: None Present Foul Odor After Cleansing: No Wound Bed Granulation Amount: None Present (0%) Exposed Structure Necrotic Amount: Large (67-100%) Fascia Exposed: No Necrotic Quality: Eschar Fat Layer Exposed: No Tendon Exposed: No Dinh, Anjolaoluwa ([id number]) Muscle Exposed: No Joint Exposed: No Bone Exposed: No Limited to Skin Breakdown Periwound Skin Texture Texture Color No Abnormalities Noted: No No Abnormalities Noted: No Callus: No Atrophie Blanche: No Crepitus: No Cyanosis: No Excoriation: No Ecchymosis: No Fluctuance: No Erythema: No Friable: No Hemosiderin Staining: No Induration: No Mottled: No Localized Edema: Yes Pallor: No Rash: No Rubor: No Scarring: No Temperature / Pain Moisture Temperature: No Abnormality No Abnormalities Noted: No Tenderness on Palpation: Yes Dry / Scaly: No Maceration: No Moist: Yes Wound Preparation Ulcer Cleansing: Rinsed/Irrigated with Saline Topical Anesthetic Applied: Other: lidocaine 4%, Electronic Signature(s) Signed: 01/15/2016 4:39:30 PM By: Alejandro Mulling Entered By: Alejandro Mulling on 01/15/2016 11:59:11 Issa, Nandini ([id number]) -------------------------------------------------------------------------------- Wound Assessment Details Patient Name: [name] Date of  Service: 01/15/2016 10:45 AM Medical Record Number: [id number] Patient Account Number: [id number] Date of Birth/Sex: [birth year] (82 y.o. Female) Treating RN: Ashok Cordia, Debi Primary Care Physician: SYSTEM, PCP Other Clinician: Referring Physician: Treating Physician/Extender: Rudene Re in Treatment: 6 Wound Status Wound Number: 6 Primary Pressure Ulcer Etiology: Wound Location: Left Metatarsal head first - Medial Wound Open Status: Wounding Event: Pressure Injury Comorbid Cataracts, Lymphedema, Aspiration, Date Acquired: 12/25/2015 History: Chronic Obstructive Pulmonary Disease Weeks Of Treatment: 3 (COPD), Arrhythmia, Congestive Heart Clustered Wound: No Failure, Hypertension, Peripheral Venous Disease, Osteoarthritis, Dementia Photos Photo Uploaded By: Alejandro Mulling on 01/15/2016 16:19:03 Wound Measurements Length: (cm) 0.1 Width: (cm) 0.1 Depth: (cm) 0.1 Area: (cm) 0.008 Volume: (cm) 0.001 % Reduction in Area: 98.7% % Reduction in Volume: 98.4% Epithelialization: Medium (34-66%) Tunneling: No Wound Description Classification: Category/Stage II Wound Margin: Distinct,  outline attached Exudate Amount: Small Exudate Type: Serosanguineous Exudate Color: red, brown Foul Odor After Cleansing: No Wound Bed Granulation Amount: Medium (34-66%) Exposed Structure Granulation Quality: Pink, Pale Fascia Exposed: No Stella, Ruthann ([id number]) Necrotic Amount: Small (1-33%) Fat Layer Exposed: No Necrotic Quality: Adherent Slough Tendon Exposed: No Muscle Exposed: No Joint Exposed: No Bone Exposed: No Limited to Skin Breakdown Periwound Skin Texture Texture Color No Abnormalities Noted: No No Abnormalities Noted: No Localized Edema: No Erythema: Yes Erythema Location: Circumferential Moisture Erythema Change: Decreased No Abnormalities Noted: No Dry / Scaly: Yes Temperature / Pain Maceration: No Temperature: No Abnormality Wound Preparation Ulcer  Cleansing: Rinsed/Irrigated with Saline Topical Anesthetic Applied: Other: lidocaine 4%, Treatment Notes Wound #6 (Left, Medial Metatarsal head first) 1. Cleansed with: Clean wound with Normal Saline 2. Anesthetic Topical Lidocaine 4% cream to wound bed prior to debridement 3. Peri-wound Care: Skin Prep 5. Secondary Dressing Applied Bordered Foam Dressing Electronic Signature(s) Signed: 01/15/2016 4:39:30 PM By: Alejandro Mulling Entered By: Alejandro Mulling on 01/15/2016 11:16:33 [name] ([id number]) -------------------------------------------------------------------------------- Wound Assessment Details Patient Name: [name] Date of Service: 01/15/2016 10:45 AM Medical Record Number: [id number] Patient Account Number: [id number] Date of Birth/Sex: October 20, 1928 (82 y.o. Female) Treating RN: Ashok Cordia, Debi Primary Care Physician: SYSTEM, PCP Other Clinician: Referring Physician: Treating Physician/Extender: Rudene Re in Treatment: 6 Wound Status Wound Number: 8 Primary Pressure Ulcer Etiology: Wound Location: Left Gluteus - Medial Wound Open Wounding Event: Pressure Injury Status: Date Acquired: 12/11/2015 Comorbid Cataracts, Lymphedema, Aspiration, Weeks Of Treatment: 3 History: Chronic Obstructive Pulmonary Disease Clustered Wound: No (COPD), Arrhythmia, Congestive Heart Failure, Hypertension, Peripheral Venous Disease, Osteoarthritis, Dementia Photos Photo Uploaded By: Alejandro Mulling on 01/15/2016 16:19:27 Wound Measurements Length: (cm) 1 Width: (cm) 0.3 Depth: (cm) 0.1 Area: (cm) 0.236 Volume: (cm) 0.024 % Reduction in Area: 69.9% % Reduction in Volume: 69.6% Tunneling: No Undermining: No Wound Description Classification: Category/Stage II Wound Margin: Flat and Intact Exudate Amount: Small Exudate Type: Serosanguineous Exudate Color: red, brown Foul Odor After Cleansing: No Wound Bed Granulation Amount: Medium (34-66%) Exposed  Structure Granulation Quality: Pink Fascia Exposed: No Durand, [name] ([id number]) Necrotic Amount: Medium (34-66%) Fat Layer Exposed: No Necrotic Quality: Adherent Slough Tendon Exposed: No Muscle Exposed: No Joint Exposed: No Bone Exposed: No Limited to Skin Breakdown Periwound Skin Texture Texture Color No Abnormalities Noted: No No Abnormalities Noted: No Callus: No Atrophie Blanche: No Crepitus: No Cyanosis: No Excoriation: No Ecchymosis: No Fluctuance: No Erythema: No Friable: No Hemosiderin Staining: No Induration: No Mottled: No Localized Edema: No Pallor: No Rash: No Rubor: No Scarring: No Moisture No Abnormalities Noted: No Dry / Scaly: No Maceration: No Moist: No Wound Preparation Ulcer Cleansing: Rinsed/Irrigated with Saline Topical Anesthetic Applied: Other: lidocaine 4%, Treatment Notes Wound #8 (Left, Medial Gluteus) 1. Cleansed with: Clean wound with Normal Saline 2. Anesthetic Topical Lidocaine 4% cream to wound bed prior to debridement 3. Peri-wound Care: Skin Prep 5. Secondary Dressing Applied Bordered Foam Dressing Electronic Signature(s) Signed: 01/15/2016 4:39:30 PM By: Alejandro Mulling Entered By: Alejandro Mulling on 01/15/2016 11:26:24 [name] ([id number]) -------------------------------------------------------------------------------- Vitals Details Patient Name: [name] Date of Service: 01/15/2016 10:45 AM Medical Record Number: [id number] Patient Account Number: [id number] Date of Birth/Sex: 1928-09-01 (82 y.o. Female) Treating RN: Ashok Cordia, Debi Primary Care Physician: SYSTEM, PCP Other Clinician: Referring Physician: Treating Physician/Extender: Rudene Re in Treatment: 6 Vital Signs Time Taken: 11:00 Temperature (F): 97.8 Height (in): 64 Pulse (bpm): 87 Weight (lbs): 153 Respiratory Rate (breaths/min): 16 Body Mass Index (BMI): 26.3  Blood Pressure (mmHg): 135/71 Reference Range: 80 - 120 mg /  dl Electronic Signature(s) Signed: 01/15/2016 4:39:30 PM By: Alejandro Mulling Entered By: Alejandro Mulling on 01/15/2016 11:00:28

## 2016-01-16 NOTE — Progress Notes (Signed)
PADEN, KURAS ([id number]) Visit Report for 01/15/2016 Chief Complaint Document Details Patient Name: [name], [name] 01/15/2016 10:45 Date of Service: AM Medical Record [id number] Number: Patient Account Number: [id number] [birth year] (82 y.o. Treating RN: Phillis Haggis Date of Birth/Sex: Female) Other Clinician: Primary Care Physician: SYSTEM, PCP Treating Desarie Feild Referring Physician: Physician/Extender: Tania Ade in Treatment: 6 Information Obtained from: Patient Chief Complaint Patient seen for complaints of Non-Healing Wound to the right upper arm laterally near the elbow for about a month. She also has some skin changes on her left heel. Electronic Signature(s) Signed: 01/15/2016 11:44:16 AM By: Evlyn Kanner MD, FACS Entered By: Evlyn Kanner on 01/15/2016 11:44:16 [name] ([id number]) -------------------------------------------------------------------------------- Debridement Details Patient Name: Anita, Kirk 01/15/2016 10:45 Date of Service: AM Medical Record [id number] Number: Patient Account Number: [id number] [birth year] (82 y.o. Treating RN: Phillis Haggis Date of Birth/Sex: Female) Other Clinician: Primary Care Physician: SYSTEM, PCP Treating Paije Goodhart Referring Physician: Physician/Extender: Tania Ade in Treatment: 6 Debridement Performed for Wound #2 Left Calcaneus Assessment: Performed By: Physician Evlyn Kanner, MD Debridement: Debridement Pre-procedure Yes Verification/Time Out Taken: Start Time: 11:32 Pain Control: Other : lidocaine 4% cream Level: Skin/Subcutaneous Tissue Total Area Debrided (L x 1.4 (cm) x 3.2 (cm) = 4.48 (cm) W): Tissue and other Viable, Non-Viable, Exudate, Fibrin/Slough, Skin, Subcutaneous material debrided: Instrument: Forceps, Scissors Bleeding: Minimum Hemostasis Achieved: Pressure End Time: 11:34 Procedural Pain: 0 Post Procedural Pain: 0 Response to Treatment: Procedure was tolerated well Post Debridement  Measurements of Total Wound Length: (cm) 3 Stage: Unstageable/Unclassified Width: (cm) 4.2 Depth: (cm) 0.2 Volume: (cm) 1.979 Post Procedure Diagnosis Same as Pre-procedure Electronic Signature(s) Signed: 01/15/2016 11:44:05 AM By: Evlyn Kanner MD, FACS Signed: 01/15/2016 4:39:30 PM By: Alejandro Mulling Entered By: Evlyn Kanner on 01/15/2016 11:44:04 [name] ([id number]) -------------------------------------------------------------------------------- HPI Details Patient Name: Anita, Kirk 01/15/2016 10:45 Date of Service: AM Medical Record [id number] Number: Patient Account Number: [id number] Nov 03, 1928 (82 y.o. Treating RN: Phillis Haggis Date of Birth/Sex: Female) Other Clinician: Primary Care Physician: SYSTEM, PCP Treating Sherrod Toothman Referring Physician: Physician/Extender: Tania Ade in Treatment: 6 History of Present Illness Location: wound on her right arm laterally near the elbow Quality: Patient reports experiencing a dull pain to affected area(s). Severity: Patient states wound are getting worse. Duration: Patient has had the wound for < 4 weeks prior to presenting for treatment Timing: Pain in wound is Intermittent (comes and goes Context: The wound appeared gradually over time Modifying Factors: Other treatment(s) tried include:local care with dressing changes and has recently been on clindamycin Associated Signs and Symptoms: Patient reports having increase swelling all over the body including upper arms and legs. HPI Description: 82 year old patient was been referred to [place] for a ulcerated area on her right elbow. She has a past medical history of CHF, atrial fibrillation, hypertension, gait instability, bilateral lower extremity edema, morbid obesity, altered mental status. She has never been a smoker. her cardiologist is Dr. Julien Nordmann, who last saw her in March 2017.he has been seeing her with a history of hypertension, gait instability, cognitive  impairment, A. fib with RVR and chronic systolic CHF. she is on Eliquis 5 mg twice a day and for the bilateral lower extremity lymphedema he had recommended elevation and compression hose. 12/11/2015 -- x-ray of the right elbow shows no evidence of fracture or bony pathology and the joint spaces are intact. The patient has held her anticoagulation for the last 72 hours. 12/18/2015 -- the patient was seen in the ER for fluid overload and breathlessness and appropriate management was  done including increasing her dosage of Lasix after an appropriate workup. Details of this have been noted from the ER record -- chest x-ray did not reveal any acute abnormality and the labs were reviewed. She was put on Lasix 40 mg twice a day for 3 days and she will be on a steroid taper. She was also advised breathing treatments twice a day and to follow-up with her cardiologist. 12/25/2015 -- she continues to have extensive lower extremity edema and has several new areas on both lower legs plus the left buttock and left elbow. These are all new since last week. 01/15/2016 -- continues to be under the care of her cardiologist regarding her Lasix dosage and continues to have a mild compression on her lower extremities as much as she can tolerate it. Electronic Signature(s) Signed: 01/15/2016 11:44:49 AM By: Evlyn Kanner MD, FACS Entered By: Evlyn Kanner on 01/15/2016 11:44:48 LASHICA, HANNAY ([id number]) LORILEE, CAFARELLA ([id number]) -------------------------------------------------------------------------------- Physical Exam Details Patient Name: Anita, Kirk 01/15/2016 10:45 Date of Service: AM Medical Record [id number] Number: Patient Account Number: [id number] 01/24/1929 (82 y.o. Treating RN: Phillis Haggis Date of Birth/Sex: Female) Other Clinician: Primary Care Physician: SYSTEM, PCP Treating Mylen Mangan Referring Physician: Physician/Extender: Tania Ade in Treatment: 6 Constitutional . Pulse regular.  Respirations normal and unlabored. Afebrile. . Eyes Nonicteric. Reactive to light. Ears, Nose, Mouth, and Throat Lips, teeth, and gums WNL.Marland Kitchen Moist mucosa without lesions. Neck supple and nontender. No palpable supraclavicular or cervical adenopathy. Normal sized without goiter. Respiratory WNL. No retractions.. Breath sounds WNL, No rubs, rales, rhonchi, or wheeze.. Cardiovascular Heart rhythm and rate regular, no murmur or gallop.. Pedal Pulses WNL. in team used to have a lot of edema both lower extremities and this is a stage II lymphedema. Chest Breasts symmetical and no nipple discharge.. Breast tissue WNL, no masses, lumps, or tenderness.. Lymphatic No adneopathy. No adenopathy. No adenopathy. Musculoskeletal Adexa without tenderness or enlargement.. Digits and nails w/o clubbing, cyanosis, infection, petechiae, ischemia, or inflammatory conditions.. Integumentary (Hair, Skin) No suspicious lesions. No crepitus or fluctuance. No peri-wound warmth or erythema. No masses.Marland Kitchen Psychiatric Judgement and insight Intact.. No evidence of depression, anxiety, or agitation.. Notes the right elbow were looks much cleaner and there is some healthy granulation tissue besides the slough at the depth. The left heel had a lot of maceration and sharp debridement was done with forceps and scissors and some of the subcutaneous debris removed. No active bleeding. Electronic Signature(s) Signed: 01/15/2016 11:45:40 AM By: Evlyn Kanner MD, FACS Entered By: Evlyn Kanner on 01/15/2016 11:45:39 NAKEMA, FAKE ([id number]) KINSIE, BELFORD ([id number]) -------------------------------------------------------------------------------- Physician Orders Details Patient Name: TYAUNA, LACAZE 01/15/2016 10:45 Date of Service: AM Medical Record [id number] Number: Patient Account Number: [id number] 04-06-1929 (82 y.o. Treating RN: Phillis Haggis Date of Birth/Sex: Female) Other Clinician: Primary Care Physician:  SYSTEM, PCP Treating Tejuan Gholson Referring Physician: Physician/Extender: Tania Ade in Treatment: 6 Verbal / Phone Orders: Yes Clinician: Pinkerton, Debi Read Back and Verified: Yes Diagnosis Coding Wound Cleansing Wound #1 Right Elbow o Clean wound with Normal Saline. Wound #2 Left Calcaneus o Clean wound with Normal Saline. Wound #5 Left Lower Leg o Clean wound with Normal Saline. Wound #6 Left,Medial Metatarsal head first o Clean wound with Normal Saline. Wound #8 Left,Medial Gluteus o Clean wound with Normal Saline. Anesthetic Wound #1 Right Elbow o Topical Lidocaine 4% cream applied to wound bed prior to debridement - for clinic use Wound #2 Left Calcaneus o Topical Lidocaine 4% cream applied to wound bed prior to debridement -  for clinic use Wound #5 Left Lower Leg o Topical Lidocaine 4% cream applied to wound bed prior to debridement - for clinic use Wound #6 Left,Medial Metatarsal head first o Topical Lidocaine 4% cream applied to wound bed prior to debridement - for clinic use Wound #8 Left,Medial Gluteus o Topical Lidocaine 4% cream applied to wound bed prior to debridement - for clinic use Skin Barriers/Peri-Wound Care Wound #1 Right Elbow o Skin Prep Fritts, Mellany ([id number]) Wound #2 Left Calcaneus o Skin Prep Wound #5 Left Lower Leg o Skin Prep Wound #6 Left,Medial Metatarsal head first o Skin Prep Wound #8 Left,Medial Gluteus o Skin Prep Primary Wound Dressing Wound #1 Right Elbow o Santyl Ointment Wound #2 Left Calcaneus o Santyl Ointment Wound #6 Left,Medial Metatarsal head first o Boardered Foam Dressing Wound #8 Left,Medial Gluteus o Boardered Foam Dressing Secondary Dressing Wound #1 Right Elbow o Boardered Foam Dressing Wound #2 Left Calcaneus o Boardered Foam Dressing Dressing Change Frequency Wound #1 Right Elbow o Change dressing every day. Wound #2 Left Calcaneus o Change dressing every  day. Wound #5 Left Lower Leg o Change dressing every other day. Wound #6 Left,Medial Metatarsal head first o Change dressing every other day. Wound #8 Left,Medial Gluteus o Change dressing every other day. ISABELLAROSE, KOPE ([id number]) Follow-up Appointments Wound #1 Right Elbow o Return Appointment in 1 week. Wound #2 Left Calcaneus o Return Appointment in 1 week. Wound #5 Left Lower Leg o Return Appointment in 1 week. Wound #6 Left,Medial Metatarsal head first o Return Appointment in 1 week. Wound #8 Left,Medial Gluteus o Return Appointment in 1 week. Edema Control Wound #1 Right Elbow o Elevate legs to the level of the heart and pump ankles as often as possible Wound #2 Left Calcaneus o Elevate legs to the level of the heart and pump ankles as often as possible Wound #5 Left Lower Leg o Elevate legs to the level of the heart and pump ankles as often as possible Wound #6 Left,Medial Metatarsal head first o Elevate legs to the level of the heart and pump ankles as often as possible Wound #8 Left,Medial Gluteus o Elevate legs to the level of the heart and pump ankles as often as possible Off-Loading Wound #1 Right Elbow o Turn and reposition every 2 hours Wound #2 Left Calcaneus o Turn and reposition every 2 hours Wound #5 Left Lower Leg o Turn and reposition every 2 hours Wound #6 Left,Medial Metatarsal head first o Turn and reposition every 2 hours Wound #8 Left,Medial Gluteus o Turn and reposition every 2 hours Grosch, Lanell ([id number]) Additional Orders / Instructions Wound #1 Right Elbow o Increase protein intake. Wound #2 Left Calcaneus o Increase protein intake. Wound #5 Left Lower Leg o Increase protein intake. Wound #6 Left,Medial Metatarsal head first o Increase protein intake. Wound #8 Left,Medial Gluteus o Increase protein intake. Home Health Wound #1 Right Elbow o Continue Home Health Visits - [facility] o  Home Health Nurse may visit PRN to address patientos wound care needs. o FACE TO FACE ENCOUNTER: MEDICARE and MEDICAID PATIENTS: I certify that this patient is under my care and that I had a face-to-face encounter that meets the physician face-to-face encounter requirements with this patient on this date. The encounter with the patient was in whole or in part for the following MEDICAL CONDITION: (primary reason for Home Healthcare) MEDICAL NECESSITY: I certify, that based on my findings, NURSING services are a medically necessary home health service. HOME BOUND STATUS: I certify that  my clinical findings support that this patient is homebound (i.e., Due to illness or injury, pt requires aid of supportive devices such as crutches, cane, wheelchairs, walkers, the use of special transportation or the assistance of another person to leave their place of residence. There is a normal inability to leave the home and doing so requires considerable and taxing effort. Other absences are for medical reasons / religious services and are infrequent or of short duration when for other reasons). o If current dressing causes regression in wound condition, may D/C ordered dressing product/s and apply Normal Saline Moist Dressing daily until next Wound Healing Center / Other MD appointment. Notify Wound Healing Center of regression in wound condition at [phone]. o Please direct any NON-WOUND related issues/requests for orders to patient's Primary Care Physician Wound #2 Left Calcaneus o Continue Home Health Visits - [facility] o Home Health Nurse may visit PRN to address patientos wound care needs. o FACE TO FACE ENCOUNTER: MEDICARE and MEDICAID PATIENTS: I certify that this patient is under my care and that I had a face-to-face encounter that meets the physician face-to-face encounter requirements with this patient on this date. The encounter with the patient was in whole or in part for the  following MEDICAL CONDITION: (primary reason for Home Healthcare) MEDICAL NECESSITY: I certify, that based on my findings, NURSING services are a medically necessary home health service. HOME BOUND STATUS: I certify that my clinical findings support that this patient is homebound (i.e., Due to illness or injury, pt requires aid of Marinaccio, Rajvi ([id number]) supportive devices such as crutches, cane, wheelchairs, walkers, the use of special transportation or the assistance of another person to leave their place of residence. There is a normal inability to leave the home and doing so requires considerable and taxing effort. Other absences are for medical reasons / religious services and are infrequent or of short duration when for other reasons). o If current dressing causes regression in wound condition, may D/C ordered dressing product/s and apply Normal Saline Moist Dressing daily until next Wound Healing Center / Other MD appointment. Notify Wound Healing Center of regression in wound condition at [phone]. o Please direct any NON-WOUND related issues/requests for orders to patient's Primary Care Physician Wound #5 Left Lower Leg o Continue Home Health Visits - [facility] o Home Health Nurse may visit PRN to address patientos wound care needs. o FACE TO FACE ENCOUNTER: MEDICARE and MEDICAID PATIENTS: I certify that this patient is under my care and that I had a face-to-face encounter that meets the physician face-to-face encounter requirements with this patient on this date. The encounter with the patient was in whole or in part for the following MEDICAL CONDITION: (primary reason for Home Healthcare) MEDICAL NECESSITY: I certify, that based on my findings, NURSING services are a medically necessary home health service. HOME BOUND STATUS: I certify that my clinical findings support that this patient is homebound (i.e., Due to illness or injury, pt requires aid of supportive  devices such as crutches, cane, wheelchairs, walkers, the use of special transportation or the assistance of another person to leave their place of residence. There is a normal inability to leave the home and doing so requires considerable and taxing effort. Other absences are for medical reasons / religious services and are infrequent or of short duration when for other reasons). o If current dressing causes regression in wound condition, may D/C ordered dressing product/s and apply Normal Saline Moist Dressing daily until next Wound Healing Center /  Other MD appointment. Notify Wound Healing Center of regression in wound condition at [phone]. o Please direct any NON-WOUND related issues/requests for orders to patient's Primary Care Physician Wound #6 Left,Medial Metatarsal head first o Continue Home Health Visits - [facility] o Home Health Nurse may visit PRN to address patientos wound care needs. o FACE TO FACE ENCOUNTER: MEDICARE and MEDICAID PATIENTS: I certify that this patient is under my care and that I had a face-to-face encounter that meets the physician face-to-face encounter requirements with this patient on this date. The encounter with the patient was in whole or in part for the following MEDICAL CONDITION: (primary reason for Home Healthcare) MEDICAL NECESSITY: I certify, that based on my findings, NURSING services are a medically necessary home health service. HOME BOUND STATUS: I certify that my clinical findings support that this patient is homebound (i.e., Due to illness or injury, pt requires aid of supportive devices such as crutches, cane, wheelchairs, walkers, the use of special transportation or the assistance of another person to leave their place of residence. There is a normal inability to leave the home and doing so requires considerable and taxing effort. Other absences are for medical reasons / religious services and are infrequent or of short  duration when for other reasons). o If current dressing causes regression in wound condition, may D/C ordered dressing product/s and apply Normal Saline Moist Dressing daily until next Wound Healing Center / Other MD appointment. Notify Wound Healing Center of regression in wound condition at [phone]. SYANN, CUPPLES ([id number]) o Please direct any NON-WOUND related issues/requests for orders to patient's Primary Care Physician Wound #8 Left,Medial Gluteus o Continue Home Health Visits - [facility] o Home Health Nurse may visit PRN to address patientos wound care needs. o FACE TO FACE ENCOUNTER: MEDICARE and MEDICAID PATIENTS: I certify that this patient is under my care and that I had a face-to-face encounter that meets the physician face-to-face encounter requirements with this patient on this date. The encounter with the patient was in whole or in part for the following MEDICAL CONDITION: (primary reason for Home Healthcare) MEDICAL NECESSITY: I certify, that based on my findings, NURSING services are a medically necessary home health service. HOME BOUND STATUS: I certify that my clinical findings support that this patient is homebound (i.e., Due to illness or injury, pt requires aid of supportive devices such as crutches, cane, wheelchairs, walkers, the use of special transportation or the assistance of another person to leave their place of residence. There is a normal inability to leave the home and doing so requires considerable and taxing effort. Other absences are for medical reasons / religious services and are infrequent or of short duration when for other reasons). o If current dressing causes regression in wound condition, may D/C ordered dressing product/s and apply Normal Saline Moist Dressing daily until next Wound Healing Center / Other MD appointment. Notify Wound Healing Center of regression in wound condition at [phone]. o Please direct any NON-WOUND  related issues/requests for orders to patient's Primary Care Physician Electronic Signature(s) Signed: 01/15/2016 3:43:28 PM By: Evlyn Kanner MD, FACS Signed: 01/15/2016 4:39:30 PM By: Alejandro Mulling Entered By: Alejandro Mulling on 01/15/2016 11:37:44 Fidler, Ligaya ([id number]) -------------------------------------------------------------------------------- Problem List Details Patient Name: EUSTOLIA, DRENNEN 01/15/2016 10:45 Date of Service: AM Medical Record [id number] Number: Patient Account Number: [id number] 1928-10-09 (82 y.o. Treating RN: Phillis Haggis Date of Birth/Sex: Female) Other Clinician: Primary Care Physician: SYSTEM, PCP Treating Han Vejar Referring Physician: Physician/Extender: Tania Ade in Treatment: 6 Active  Problems ICD-10 Encounter Code Description Active Date Diagnosis S41.101A Unspecified open wound of right upper arm, initial 12/04/2015 Yes encounter L89.629 Pressure ulcer of left heel, unspecified stage 12/04/2015 Yes I50.40 Unspecified combined systolic (congestive) and diastolic 12/04/2015 Yes (congestive) heart failure I89.0 Lymphedema, not elsewhere classified 12/04/2015 Yes L89.322 Pressure ulcer of left buttock, stage 2 12/25/2015 Yes S41.102A Unspecified open wound of left upper arm, initial 12/25/2015 Yes encounter Inactive Problems Resolved Problems Electronic Signature(s) Signed: 01/15/2016 11:43:50 AM By: Evlyn Kanner MD, FACS Entered By: Evlyn Kanner on 01/15/2016 11:43:50 [name] ([id number]) -------------------------------------------------------------------------------- Progress Note Details Patient Name: DAPHNA, LAFUENTE 01/15/2016 10:45 Date of Service: AM Medical Record [id number] Number: Patient Account Number: [id number] 02/20/1929 (82 y.o. Treating RN: Phillis Haggis Date of Birth/Sex: Female) Other Clinician: Primary Care Physician: SYSTEM, PCP Treating Casara Perrier Referring Physician: Physician/Extender: Tania Ade in Treatment:  6 Subjective Chief Complaint Information obtained from Patient Patient seen for complaints of Non-Healing Wound to the right upper arm laterally near the elbow for about a month. She also has some skin changes on her left heel. History of Present Illness (HPI) The following HPI elements were documented for the patient's wound: Location: wound on her right arm laterally near the elbow Quality: Patient reports experiencing a dull pain to affected area(s). Severity: Patient states wound are getting worse. Duration: Patient has had the wound for < 4 weeks prior to presenting for treatment Timing: Pain in wound is Intermittent (comes and goes Context: The wound appeared gradually over time Modifying Factors: Other treatment(s) tried include:local care with dressing changes and has recently been on clindamycin Associated Signs and Symptoms: Patient reports having increase swelling all over the body including upper arms and legs. 82 year old patient was been referred to [place] for a ulcerated area on her right elbow. She has a past medical history of CHF, atrial fibrillation, hypertension, gait instability, bilateral lower extremity edema, morbid obesity, altered mental status. She has never been a smoker. her cardiologist is Dr. Julien Nordmann, who last saw her in March 2017.he has been seeing her with a history of hypertension, gait instability, cognitive impairment, A. fib with RVR and chronic systolic CHF. she is on Eliquis 5 mg twice a day and for the bilateral lower extremity lymphedema he had recommended elevation and compression hose. 12/11/2015 -- x-ray of the right elbow shows no evidence of fracture or bony pathology and the joint spaces are intact. The patient has held her anticoagulation for the last 72 hours. 12/18/2015 -- the patient was seen in the ER for fluid overload and breathlessness and appropriate management was done including increasing her dosage of Lasix after an  appropriate workup. Details of this have been noted from the ER record -- chest x-ray did not reveal any acute abnormality and the labs were reviewed. She was put on Lasix 40 mg twice a day for 3 days and she will be on a steroid taper. She was also advised breathing treatments twice a day and to follow-up with her cardiologist. DARRIONA, DEHAAS ([id number]) 12/25/2015 -- she continues to have extensive lower extremity edema and has several new areas on both lower legs plus the left buttock and left elbow. These are all new since last week. 01/15/2016 -- continues to be under the care of her cardiologist regarding her Lasix dosage and continues to have a mild compression on her lower extremities as much as she can tolerate it. Objective Constitutional Pulse regular. Respirations normal and unlabored. Afebrile. Vitals Time Taken: 11:00 AM, Height: 64 in, Weight:  153 lbs, BMI: 26.3, Temperature: 97.8 F, Pulse: 87 bpm, Respiratory Rate: 16 breaths/min, Blood Pressure: 135/71 mmHg. Eyes Nonicteric. Reactive to light. Ears, Nose, Mouth, and Throat Lips, teeth, and gums WNL.Marland Kitchen Moist mucosa without lesions. Neck supple and nontender. No palpable supraclavicular or cervical adenopathy. Normal sized without goiter. Respiratory WNL. No retractions.. Breath sounds WNL, No rubs, rales, rhonchi, or wheeze.. Cardiovascular Heart rhythm and rate regular, no murmur or gallop.. Pedal Pulses WNL. in team used to have a lot of edema both lower extremities and this is a stage II lymphedema. Chest Breasts symmetical and no nipple discharge.. Breast tissue WNL, no masses, lumps, or tenderness.. Lymphatic No adneopathy. No adenopathy. No adenopathy. Musculoskeletal Adexa without tenderness or enlargement.. Digits and nails w/o clubbing, cyanosis, infection, petechiae, ischemia, or inflammatory conditions.Marland Kitchen Psychiatric Judgement and insight Intact.. No evidence of depression, anxiety, or agitation.. General  Notes: the right elbow were looks much cleaner and there is some healthy granulation tissue Attar, Octa ([id number]) besides the slough at the depth. The left heel had a lot of maceration and sharp debridement was done with forceps and scissors and some of the subcutaneous debris removed. No active bleeding. Integumentary (Hair, Skin) No suspicious lesions. No crepitus or fluctuance. No peri-wound warmth or erythema. No masses.. Wound #1 status is Open. Original cause of wound was Gradually Appeared. The wound is located on the Right Elbow. The wound measures 2.2cm length x 2cm width x 1.1cm depth; 3.456cm^2 area and 3.801cm^3 volume. The wound is limited to skin breakdown. There is no tunneling noted, however, there is undermining starting at 12:00 and ending at :00 with a maximum distance of 1.1cm. There is a large amount of serous drainage noted. The wound margin is flat and intact. There is large (67-100%) pink granulation within the wound bed. There is a small (1-33%) amount of necrotic tissue within the wound bed including Adherent Slough. The periwound skin appearance exhibited: Localized Edema, Moist, Erythema. The surrounding wound skin color is noted with erythema which is circumferential. Periwound temperature was noted as No Abnormality. The periwound has tenderness on palpation. Wound #10 status is Open. Original cause of wound was Trauma. The wound is located on the Left Elbow. The wound measures 2.5cm length x 0.8cm width x 0.1cm depth; 1.571cm^2 area and 0.157cm^3 volume. The wound is limited to skin breakdown. There is no tunneling or undermining noted. There is a large amount of serosanguineous drainage noted. The wound margin is distinct with the outline attached to the wound base. There is no granulation within the wound bed. There is a large (67-100%) amount of necrotic tissue within the wound bed including Eschar. The periwound skin appearance exhibited: Localized  Edema, Moist, Erythema. The surrounding wound skin color is noted with erythema which is circumferential. Periwound temperature was noted as No Abnormality. The periwound has tenderness on palpation. Wound #2 status is Open. Original cause of wound was Gradually Appeared. The wound is located on the Left Calcaneus. The wound measures 1.4cm length x 3.2cm width x 0.2cm depth; 3.519cm^2 area and 0.704cm^3 volume. The wound is limited to skin breakdown. There is no tunneling or undermining noted. There is a large amount of serous drainage noted. The wound margin is flat and intact. There is small (1-33%) pink granulation within the wound bed. There is a large (67-100%) amount of necrotic tissue within the wound bed including Eschar and Adherent Slough. The periwound skin appearance exhibited: Localized Edema, Maceration, Moist. The periwound skin appearance did not exhibit: Callus, Crepitus,  Excoriation, Fluctuance, Friable, Induration, Rash, Scarring, Dry/Scaly, Atrophie Blanche, Cyanosis, Ecchymosis, Hemosiderin Staining, Mottled, Pallor, Rubor, Erythema. Periwound temperature was noted as No Abnormality. The periwound has tenderness on palpation. Wound #5 status is Open. Original cause of wound was Gradually Appeared. The wound is located on the Left Lower Leg. The wound measures 0cm length x 0cm width x 0cm depth; 0cm^2 area and 0cm^3 volume. The wound is limited to skin breakdown. There is no tunneling or undermining noted. There is a none present amount of drainage noted. The wound margin is indistinct and nonvisible. There is no granulation within the wound bed. There is a large (67-100%) amount of necrotic tissue within the wound bed including Eschar. The periwound skin appearance exhibited: Localized Edema, Moist. The periwound skin appearance did not exhibit: Callus, Crepitus, Excoriation, Fluctuance, Friable, Induration, Rash, Scarring, Dry/Scaly, Maceration, Atrophie Blanche, Cyanosis,  Ecchymosis, Hemosiderin Staining, Mottled, Pallor, Rubor, Erythema. Periwound temperature was noted as No Abnormality. The periwound has tenderness on palpation. Wound #6 status is Open. Original cause of wound was Pressure Injury. The wound is located on the Left,Medial Metatarsal head first. The wound measures 0.1cm length x 0.1cm width x 0.1cm depth; 0.008cm^2 area and 0.001cm^3 volume. The wound is limited to skin breakdown. There is no tunneling noted. There is a small amount of serosanguineous drainage noted. The wound margin is distinct with the Mcever, Aralynn ([id number]) outline attached to the wound base. There is medium (34-66%) pink, pale granulation within the wound bed. There is a small (1-33%) amount of necrotic tissue within the wound bed including Adherent Slough. The periwound skin appearance exhibited: Dry/Scaly, Erythema. The periwound skin appearance did not exhibit: Localized Edema, Maceration. The surrounding wound skin color is noted with erythema which is circumferential. Periwound temperature was noted as No Abnormality. Wound #8 status is Open. Original cause of wound was Pressure Injury. The wound is located on the Left,Medial Gluteus. The wound measures 1cm length x 0.3cm width x 0.1cm depth; 0.236cm^2 area and 0.024cm^3 volume. The wound is limited to skin breakdown. There is no tunneling or undermining noted. There is a small amount of serosanguineous drainage noted. The wound margin is flat and intact. There is medium (34-66%) pink granulation within the wound bed. There is a medium (34-66%) amount of necrotic tissue within the wound bed including Adherent Slough. The periwound skin appearance did not exhibit: Callus, Crepitus, Excoriation, Fluctuance, Friable, Induration, Localized Edema, Rash, Scarring, Dry/Scaly, Maceration, Moist, Atrophie Blanche, Cyanosis, Ecchymosis, Hemosiderin Staining, Mottled, Pallor, Rubor, Erythema. Assessment Active  Problems ICD-10 S41.101A - Unspecified open wound of right upper arm, initial encounter L89.629 - Pressure ulcer of left heel, unspecified stage I50.40 - Unspecified combined systolic (congestive) and diastolic (congestive) heart failure I89.0 - Lymphedema, not elsewhere classified L89.322 - Pressure ulcer of left buttock, stage 2 S41.102A - Unspecified open wound of left upper arm, initial encounter Procedures Wound #2 Wound #2 is a Pressure Ulcer located on the Left Calcaneus . There was a Skin/Subcutaneous Tissue Debridement ([id number]) debridement with total area of 4.48 sq cm performed by Evlyn Kanner, MD. with the following instrument(s): Forceps and Scissors to remove Viable and Non-Viable tissue/material including Exudate, Fibrin/Slough, Skin, and Subcutaneous after achieving pain control using Other (lidocaine 4% cream). A time out was conducted prior to the start of the procedure. A Minimum amount of bleeding was controlled with Pressure. The procedure was tolerated well with a pain level of 0 throughout and a pain level of 0 following the procedure. Post Debridement Measurements: 3cm  length x 4.2cm width x 0.2cm depth; 1.979cm^3 volume. Post debridement Stage noted as Unstageable/Unclassified. Post procedure Diagnosis Wound #2: Same as Pre-Procedure Cahalan, Vasilisa ([id number]) Plan Wound Cleansing: Wound #1 Right Elbow: Clean wound with Normal Saline. Wound #2 Left Calcaneus: Clean wound with Normal Saline. Wound #5 Left Lower Leg: Clean wound with Normal Saline. Wound #6 Left,Medial Metatarsal head first: Clean wound with Normal Saline. Wound #8 Left,Medial Gluteus: Clean wound with Normal Saline. Anesthetic: Wound #1 Right Elbow: Topical Lidocaine 4% cream applied to wound bed prior to debridement - for clinic use Wound #2 Left Calcaneus: Topical Lidocaine 4% cream applied to wound bed prior to debridement - for clinic use Wound #5 Left Lower Leg: Topical Lidocaine  4% cream applied to wound bed prior to debridement - for clinic use Wound #6 Left,Medial Metatarsal head first: Topical Lidocaine 4% cream applied to wound bed prior to debridement - for clinic use Wound #8 Left,Medial Gluteus: Topical Lidocaine 4% cream applied to wound bed prior to debridement - for clinic use Skin Barriers/Peri-Wound Care: Wound #1 Right Elbow: Skin Prep Wound #2 Left Calcaneus: Skin Prep Wound #5 Left Lower Leg: Skin Prep Wound #6 Left,Medial Metatarsal head first: Skin Prep Wound #8 Left,Medial Gluteus: Skin Prep Primary Wound Dressing: Wound #1 Right Elbow: Santyl Ointment Wound #2 Left Calcaneus: Santyl Ointment Wound #6 Left,Medial Metatarsal head first: Boardered Foam Dressing Wound #8 Left,Medial Gluteus: Boardered Foam Dressing Secondary Dressing: Wound #1 Right Elbow: Boardered Foam Dressing Funk, Panayiota ([id number]) Wound #2 Left Calcaneus: Boardered Foam Dressing Dressing Change Frequency: Wound #1 Right Elbow: Change dressing every day. Wound #2 Left Calcaneus: Change dressing every day. Wound #5 Left Lower Leg: Change dressing every other day. Wound #6 Left,Medial Metatarsal head first: Change dressing every other day. Wound #8 Left,Medial Gluteus: Change dressing every other day. Follow-up Appointments: Wound #1 Right Elbow: Return Appointment in 1 week. Wound #2 Left Calcaneus: Return Appointment in 1 week. Wound #5 Left Lower Leg: Return Appointment in 1 week. Wound #6 Left,Medial Metatarsal head first: Return Appointment in 1 week. Wound #8 Left,Medial Gluteus: Return Appointment in 1 week. Edema Control: Wound #1 Right Elbow: Elevate legs to the level of the heart and pump ankles as often as possible Wound #2 Left Calcaneus: Elevate legs to the level of the heart and pump ankles as often as possible Wound #5 Left Lower Leg: Elevate legs to the level of the heart and pump ankles as often as possible Wound #6 Left,Medial  Metatarsal head first: Elevate legs to the level of the heart and pump ankles as often as possible Wound #8 Left,Medial Gluteus: Elevate legs to the level of the heart and pump ankles as often as possible Off-Loading: Wound #1 Right Elbow: Turn and reposition every 2 hours Wound #2 Left Calcaneus: Turn and reposition every 2 hours Wound #5 Left Lower Leg: Turn and reposition every 2 hours Wound #6 Left,Medial Metatarsal head first: Turn and reposition every 2 hours Wound #8 Left,Medial Gluteus: Turn and reposition every 2 hours Additional Orders / Instructions: Wound #1 Right Elbow: Increase protein intake. Wound #2 Left Calcaneus: Increase protein intake. Spoerl, Despina ([id number]) Wound #5 Left Lower Leg: Increase protein intake. Wound #6 Left,Medial Metatarsal head first: Increase protein intake. Wound #8 Left,Medial Gluteus: Increase protein intake. Home Health: Wound #1 Right Elbow: Continue Home Health Visits - [facility] Nurse may visit PRN to address patient s wound care needs. FACE TO FACE ENCOUNTER: MEDICARE and MEDICAID PATIENTS: I certify that this patient  is under my care and that I had a face-to-face encounter that meets the physician face-to-face encounter requirements with this patient on this date. The encounter with the patient was in whole or in part for the following MEDICAL CONDITION: (primary reason for Home Healthcare) MEDICAL NECESSITY: I certify, that based on my findings, NURSING services are a medically necessary home health service. HOME BOUND STATUS: I certify that my clinical findings support that this patient is homebound (i.e., Due to illness or injury, pt requires aid of supportive devices such as crutches, cane, wheelchairs, walkers, the use of special transportation or the assistance of another person to leave their place of residence. There is a normal inability to leave the home and doing so requires considerable and taxing effort.  Other absences are for medical reasons / religious services and are infrequent or of short duration when for other reasons). If current dressing causes regression in wound condition, may D/C ordered dressing product/s and apply Normal Saline Moist Dressing daily until next Wound Healing Center / Other MD appointment. Notify Wound Healing Center of regression in wound condition at [phone]. Please direct any NON-WOUND related issues/requests for orders to patient's Primary Care Physician Wound #2 Left Calcaneus: Continue Home Health Visits - [facility] Nurse may visit PRN to address patient s wound care needs. FACE TO FACE ENCOUNTER: MEDICARE and MEDICAID PATIENTS: I certify that this patient is under my care and that I had a face-to-face encounter that meets the physician face-to-face encounter requirements with this patient on this date. The encounter with the patient was in whole or in part for the following MEDICAL CONDITION: (primary reason for Home Healthcare) MEDICAL NECESSITY: I certify, that based on my findings, NURSING services are a medically necessary home health service. HOME BOUND STATUS: I certify that my clinical findings support that this patient is homebound (i.e., Due to illness or injury, pt requires aid of supportive devices such as crutches, cane, wheelchairs, walkers, the use of special transportation or the assistance of another person to leave their place of residence. There is a normal inability to leave the home and doing so requires considerable and taxing effort. Other absences are for medical reasons / religious services and are infrequent or of short duration when for other reasons). If current dressing causes regression in wound condition, may D/C ordered dressing product/s and apply Normal Saline Moist Dressing daily until next Wound Healing Center / Other MD appointment. Notify Wound Healing Center of regression in wound condition at  [phone]. Please direct any NON-WOUND related issues/requests for orders to patient's Primary Care Physician Wound #5 Left Lower Leg: Continue Home Health Visits - [facility] Nurse may visit PRN to address patient s wound care needs. FACE TO FACE ENCOUNTER: MEDICARE and MEDICAID PATIENTS: I certify that this patient is under my care and that I had a face-to-face encounter that meets the physician face-to-face encounter requirements with this patient on this date. The encounter with the patient was in whole or in part for the following MEDICAL CONDITION: (primary reason for Home Healthcare) MEDICAL NECESSITY: I certify, that based on my findings, NURSING services are a medically necessary home health service. HOME BOUND STATUS: I certify that my clinical findings support that this patient is homebound (i.e., Due to illness or injury, pt requires aid of supportive devices such as crutches, cane, wheelchairs, walkers, the use Sedlar, Yuleidy ([id number]) of special transportation or the assistance of another person to leave their place of residence. There is a  normal inability to leave the home and doing so requires considerable and taxing effort. Other absences are for medical reasons / religious services and are infrequent or of short duration when for other reasons). If current dressing causes regression in wound condition, may D/C ordered dressing product/s and apply Normal Saline Moist Dressing daily until next Wound Healing Center / Other MD appointment. Notify Wound Healing Center of regression in wound condition at [phone]. Please direct any NON-WOUND related issues/requests for orders to patient's Primary Care Physician Wound #6 Left,Medial Metatarsal head first: Continue Home Health Visits - [facility] Nurse may visit PRN to address patient s wound care needs. FACE TO FACE ENCOUNTER: MEDICARE and MEDICAID PATIENTS: I certify that this patient is under my care  and that I had a face-to-face encounter that meets the physician face-to-face encounter requirements with this patient on this date. The encounter with the patient was in whole or in part for the following MEDICAL CONDITION: (primary reason for Home Healthcare) MEDICAL NECESSITY: I certify, that based on my findings, NURSING services are a medically necessary home health service. HOME BOUND STATUS: I certify that my clinical findings support that this patient is homebound (i.e., Due to illness or injury, pt requires aid of supportive devices such as crutches, cane, wheelchairs, walkers, the use of special transportation or the assistance of another person to leave their place of residence. There is a normal inability to leave the home and doing so requires considerable and taxing effort. Other absences are for medical reasons / religious services and are infrequent or of short duration when for other reasons). If current dressing causes regression in wound condition, may D/C ordered dressing product/s and apply Normal Saline Moist Dressing daily until next Wound Healing Center / Other MD appointment. Notify Wound Healing Center of regression in wound condition at [phone]. Please direct any NON-WOUND related issues/requests for orders to patient's Primary Care Physician Wound #8 Left,Medial Gluteus: Continue Home Health Visits - [facility] Nurse may visit PRN to address patient s wound care needs. FACE TO FACE ENCOUNTER: MEDICARE and MEDICAID PATIENTS: I certify that this patient is under my care and that I had a face-to-face encounter that meets the physician face-to-face encounter requirements with this patient on this date. The encounter with the patient was in whole or in part for the following MEDICAL CONDITION: (primary reason for Home Healthcare) MEDICAL NECESSITY: I certify, that based on my findings, NURSING services are a medically necessary home health service.  HOME BOUND STATUS: I certify that my clinical findings support that this patient is homebound (i.e., Due to illness or injury, pt requires aid of supportive devices such as crutches, cane, wheelchairs, walkers, the use of special transportation or the assistance of another person to leave their place of residence. There is a normal inability to leave the home and doing so requires considerable and taxing effort. Other absences are for medical reasons / religious services and are infrequent or of short duration when for other reasons). If current dressing causes regression in wound condition, may D/C ordered dressing product/s and apply Normal Saline Moist Dressing daily until next Wound Healing Center / Other MD appointment. Notify Wound Healing Center of regression in wound condition at [phone]. Please direct any NON-WOUND related issues/requests for orders to patient's Primary Care Physician [place], [place]leigh ([id number]) I have recommended: 1. Santyl ointment locally to the right elbow area, and the left heel wound, which should be applied daily. 2. to the left elbow we  will apply a piece of border foam. 3. We have discussed offloading techniques in detail for the lower extremities and at the present time will apply some Santyl ointment to the left heels. 4. good control of her CHF and lymphedema. Elevation and exercises the only thing we can do at present time and I have told her to stop using the TED hose. 5. Review at the wound center next week. Electronic Signature(s) Signed: 01/15/2016 3:44:26 PM By: Evlyn KannerBritto, Nathaly Dawkins MD, FACS Previous Signature: 01/15/2016 11:49:21 AM Version By: Evlyn KannerBritto, Elgene Coral MD, FACS Entered By: Evlyn KannerBritto, Urho Rio on 01/15/2016 15:44:26 Elray BubaJOBE, Karoline ([id number]) -------------------------------------------------------------------------------- SuperBill Details Patient Name: Elray BubaJOBE, Norinne Date of Service: 01/15/2016 Medical Record Number: [id number] Patient Account Number:  [id number] Date of Birth/Sex: 19-Jul-1928 (82 y.o. Female) Treating RN: Ashok CordiaPinkerton, Debi Primary Care Physician: SYSTEM, PCP Other Clinician: Referring Physician: Treating Physician/Extender: Rudene ReBritto, Yossef Gilkison Weeks in Treatment: 6 Diagnosis Coding ICD-10 Codes Code Description S41.101A Unspecified open wound of right upper arm, initial encounter L89.629 Pressure ulcer of left heel, unspecified stage I50.40 Unspecified combined systolic (congestive) and diastolic (congestive) heart failure I89.0 Lymphedema, not elsewhere classified L89.322 Pressure ulcer of left buttock, stage 2 S41.102A Unspecified open wound of left upper arm, initial encounter Facility Procedures CPT4 Code: [id number] Description: 11042 - DEB SUBQ TISSUE 20 SQ CM/< ICD-10 Description Diagnosis S41.101A Unspecified open wound of right upper arm, initia L89.629 Pressure ulcer of left heel, unspecified stage I89.0 Lymphedema, not elsewhere classified Modifier: l encounter Quantity: 1 Physician Procedures CPT4 Code: [id number] Description: 11042 - WC PHYS SUBQ TISS 20 SQ CM ICD-10 Description Diagnosis S41.101A Unspecified open wound of right upper arm, initia L89.629 Pressure ulcer of left heel, unspecified stage I89.0 Lymphedema, not elsewhere classified Modifier: l encounter Quantity: 1 Electronic Signature(s) Signed: 01/15/2016 11:49:36 AM By: Evlyn KannerBritto, Jodie Cavey MD, FACS Entered By: Evlyn KannerBritto, Tamberly Pomplun on 01/15/2016 11:49:35

## 2016-01-22 ENCOUNTER — Encounter: Payer: Medicare Other | Admitting: Surgery

## 2016-01-22 DIAGNOSIS — S41101A Unspecified open wound of right upper arm, initial encounter: Secondary | ICD-10-CM | POA: Diagnosis not present

## 2016-01-23 NOTE — Progress Notes (Signed)
Anita, Kirk ([id number]) Visit Report for 01/22/2016 Chief Complaint Document Details Patient Name: [name], [name] 01/22/2016 10:45 Date of Service: AM Medical Record [id number] Number: Patient Account Number: [id number] 1928-09-05 (82 y.o. Treating RN: Phillis Haggis Date of Birth/Sex: Female) Other Clinician: Primary Care Physician: SYSTEM, PCP Treating Brenn Deziel Referring Physician: Physician/Extender: Tania Ade in Treatment: 7 Information Obtained from: Patient Chief Complaint Patient seen for complaints of Non-Healing Wound to the right upper arm laterally near the elbow for about a month. She also has some skin changes on her left heel. Electronic Signature(s) Signed: 01/22/2016 11:37:52 AM By: Evlyn Kanner MD, FACS Entered By: Evlyn Kanner on 01/22/2016 11:37:52 [name] ([id number]) -------------------------------------------------------------------------------- Debridement Details Patient Name: [name] 01/22/2016 10:45 Date of Service: AM Medical Record [id number] Number: Patient Account Number: [id number] 1929/02/15 (82 y.o. Treating RN: Phillis Haggis Date of Birth/Sex: Female) Other Clinician: Primary Care Physician: SYSTEM, PCP Treating Alvita Fana Referring Physician: Physician/Extender: Tania Ade in Treatment: 7 Debridement Performed for Wound #2 Left Calcaneus Assessment: Performed By: Physician Evlyn Kanner, MD Debridement: Debridement Pre-procedure Yes Verification/Time Out Taken: Start Time: 11:25 Pain Control: Lidocaine 4% Topical Solution Level: Skin/Subcutaneous Tissue Total Area Debrided (L x 3.5 (cm) x 4.5 (cm) = 15.75 (cm) W): Tissue and other Viable, Non-Viable, Eschar, Fibrin/Slough, Skin, Subcutaneous material debrided: Instrument: Forceps, Scissors Bleeding: Minimum Hemostasis Achieved: Pressure End Time: 11:27 Procedural Pain: 0 Post Procedural Pain: 0 Response to Treatment: Procedure was tolerated well Post Debridement  Measurements of Total Wound Length: (cm) 3.5 Stage: Category/Stage III Width: (cm) 4.5 Depth: (cm) 0.2 Volume: (cm) 2.474 Post Procedure Diagnosis Same as Pre-procedure Electronic Signature(s) Signed: 01/22/2016 11:37:43 AM By: Evlyn Kanner MD, FACS Signed: 01/22/2016 5:19:00 PM By: Alejandro Mulling Entered By: Evlyn Kanner on 01/22/2016 11:37:43 [name], [name] ([id number]) -------------------------------------------------------------------------------- HPI Details Patient Name: [name], [name] 01/22/2016 10:45 Date of Service: AM Medical Record [id number] Number: Patient Account Number: [id number] Aug 01, 1928 (82 y.o. Treating RN: Phillis Haggis Date of Birth/Sex: Female) Other Clinician: Primary Care Physician: SYSTEM, PCP Treating Lilac Hoff Referring Physician: Physician/Extender: Tania Ade in Treatment: 7 History of Present Illness Location: wound on her right arm laterally near the elbow Quality: Patient reports experiencing a dull pain to affected area(s). Severity: Patient states wound are getting worse. Duration: Patient has had the wound for < 4 weeks prior to presenting for treatment Timing: Pain in wound is Intermittent (comes and goes Context: The wound appeared gradually over time Modifying Factors: Other treatment(s) tried include:local care with dressing changes and has recently been on clindamycin Associated Signs and Symptoms: Patient reports having increase swelling all over the body including upper arms and legs. HPI Description: 81 year old patient was been referred to [place] for a ulcerated area on her right elbow. She has a past medical history of CHF, atrial fibrillation, hypertension, gait instability, bilateral lower extremity edema, morbid obesity, altered mental status. She has never been a smoker. her cardiologist is Dr. Julien Nordmann, who last saw her in March 2017.he has been seeing her with a history of hypertension, gait instability, cognitive  impairment, A. fib with RVR and chronic systolic CHF. she is on Eliquis 5 mg twice a day and for the bilateral lower extremity lymphedema he had recommended elevation and compression hose. 12/11/2015 -- x-ray of the right elbow shows no evidence of fracture or bony pathology and the joint spaces are intact. The patient has held her anticoagulation for the last 72 hours. 12/18/2015 -- the patient was seen in the ER for fluid overload and breathlessness and appropriate management was  done including increasing her dosage of Lasix after an appropriate workup. Details of this have been noted from the ER record -- chest x-ray did not reveal any acute abnormality and the labs were reviewed. She was put on Lasix 40 mg twice a day for 3 days and she will be on a steroid taper. She was also advised breathing treatments twice a day and to follow-up with her cardiologist. 12/25/2015 -- she continues to have extensive lower extremity edema and has several new areas on both lower legs plus the left buttock and left elbow. These are all new since last week. 01/15/2016 -- continues to be under the care of her cardiologist regarding her Lasix dosage and continues to have a mild compression on her lower extremities as much as she can tolerate it. Electronic Signature(s) Signed: 01/22/2016 11:38:20 AM By: Evlyn Kanner MD, FACS Entered By: Evlyn Kanner on 01/22/2016 11:38:20 [name], [name] ([id number]) [name], [name] ([id number]) -------------------------------------------------------------------------------- Physical Exam Details Patient Name: [name], [name] 01/22/2016 10:45 Date of Service: AM Medical Record [id number] Number: Patient Account Number: [id number] 1929-04-08 (82 y.o. Treating RN: Phillis Haggis Date of Birth/Sex: Female) Other Clinician: Primary Care Physician: SYSTEM, PCP Treating Ernestine Rohman Referring Physician: Physician/Extender: Tania Ade in Treatment: 7 Constitutional . Pulse regular.  Respirations normal and unlabored. Afebrile. . Eyes Nonicteric. Reactive to light. Ears, Nose, Mouth, and Throat Lips, teeth, and gums WNL.Marland Kitchen Moist mucosa without lesions. Neck supple and nontender. No palpable supraclavicular or cervical adenopathy. Normal sized without goiter. Respiratory WNL. No retractions.. Breath sounds WNL, No rubs, rales, rhonchi, or wheeze.. Cardiovascular Heart rhythm and rate regular, no murmur or gallop.. Pedal Pulses WNL. No clubbing, cyanosis or edema. Lymphatic No adneopathy. No adenopathy. No adenopathy. Musculoskeletal Adexa without tenderness or enlargement.. Digits and nails w/o clubbing, cyanosis, infection, petechiae, ischemia, or inflammatory conditions.. Integumentary (Hair, Skin) No suspicious lesions. No crepitus or fluctuance. No peri-wound warmth or erythema. No masses.Marland Kitchen Psychiatric Judgement and insight Intact.. No evidence of depression, anxiety, or agitation.. Notes the left elbow is completely healed. The right elbow is looking much cleaner and has minimal slough at the depth and there is no undermining. If he continues to have some macerated skin and subcutaneous debris and this was sharply removed with forceps and scissors and bleeding controlled with pressure Electronic Signature(s) Signed: 01/22/2016 11:39:14 AM By: Evlyn Kanner MD, FACS Entered By: Evlyn Kanner on 01/22/2016 11:39:13 [name] ([id number]) -------------------------------------------------------------------------------- Physician Orders Details Patient Name: [name], [name] 01/22/2016 10:45 Date of Service: AM Medical Record [id number] Number: Patient Account Number: [id number] 12/07/1928 (82 y.o. Treating RN: Curtis Sites Date of Birth/Sex: Female) Other Clinician: Primary Care Physician: SYSTEM, PCP Treating Stacey Sago Referring Physician: Physician/Extender: Tania Ade in Treatment: 7 Verbal / Phone Orders: Yes Clinician: Curtis Sites Read Back and  Verified: Yes Diagnosis Coding Wound Cleansing Wound #1 Right Elbow o Clean wound with Normal Saline. Wound #2 Left Calcaneus o Clean wound with Normal Saline. Wound #6 Left,Medial Metatarsal head first o Clean wound with Normal Saline. Anesthetic Wound #1 Right Elbow o Topical Lidocaine 4% cream applied to wound bed prior to debridement - for clinic use Wound #2 Left Calcaneus o Topical Lidocaine 4% cream applied to wound bed prior to debridement - for clinic use Wound #6 Left,Medial Metatarsal head first o Topical Lidocaine 4% cream applied to wound bed prior to debridement - for clinic use Skin Barriers/Peri-Wound Care Wound #1 Right Elbow o Skin Prep Wound #2 Left Calcaneus o Skin Prep Wound #6 Left,Medial Metatarsal head first o Skin  Prep Primary Wound Dressing Wound #1 Right Elbow o Santyl Ointment Wound #2 Left Calcaneus [name], [name] ([id number]) o Santyl Ointment Wound #6 Left,Medial Metatarsal head first o Boardered Foam Dressing Secondary Dressing Wound #1 Right Elbow o Boardered Foam Dressing Wound #2 Left Calcaneus o Boardered Foam Dressing Dressing Change Frequency Wound #1 Right Elbow o Change dressing every day. Wound #2 Left Calcaneus o Change dressing every day. Wound #6 Left,Medial Metatarsal head first o Change dressing every other day. Follow-up Appointments Wound #1 Right Elbow o Return Appointment in 1 week. Wound #2 Left Calcaneus o Return Appointment in 1 week. Wound #6 Left,Medial Metatarsal head first o Return Appointment in 1 week. Edema Control Wound #1 Right Elbow o Elevate legs to the level of the heart and pump ankles as often as possible Wound #2 Left Calcaneus o Elevate legs to the level of the heart and pump ankles as often as possible Wound #6 Left,Medial Metatarsal head first o Elevate legs to the level of the heart and pump ankles as often as possible Off-Loading Wound #1 Right  Elbow o Turn and reposition every 2 hours Wound #2 Left Calcaneus [name], [name] ([id number]) o Turn and reposition every 2 hours Wound #6 Left,Medial Metatarsal head first o Turn and reposition every 2 hours Additional Orders / Instructions Wound #1 Right Elbow o Increase protein intake. Wound #2 Left Calcaneus o Increase protein intake. Wound #6 Left,Medial Metatarsal head first o Increase protein intake. Home Health Wound #1 Right Elbow o Continue Home Health Visits - [facility] o Home Health Nurse may visit PRN to address patientos wound care needs. o FACE TO FACE ENCOUNTER: MEDICARE and MEDICAID PATIENTS: I certify that this patient is under my care and that I had a face-to-face encounter that meets the physician face-to-face encounter requirements with this patient on this date. The encounter with the patient was in whole or in part for the following MEDICAL CONDITION: (primary reason for Home Healthcare) MEDICAL NECESSITY: I certify, that based on my findings, NURSING services are a medically necessary home health service. HOME BOUND STATUS: I certify that my clinical findings support that this patient is homebound (i.e., Due to illness or injury, pt requires aid of supportive devices such as crutches, cane, wheelchairs, walkers, the use of special transportation or the assistance of another person to leave their place of residence. There is a normal inability to leave the home and doing so requires considerable and taxing effort. Other absences are for medical reasons / religious services and are infrequent or of short duration when for other reasons). o If current dressing causes regression in wound condition, may D/C ordered dressing product/s and apply Normal Saline Moist Dressing daily until next Wound Healing Center / Other MD appointment. Notify Wound Healing Center of regression in wound condition at [phone]. o Please direct any NON-WOUND related  issues/requests for orders to patient's Primary Care Physician Wound #2 Left Calcaneus o Continue Home Health Visits - [facility] o Home Health Nurse may visit PRN to address patientos wound care needs. o FACE TO FACE ENCOUNTER: MEDICARE and MEDICAID PATIENTS: I certify that this patient is under my care and that I had a face-to-face encounter that meets the physician face-to-face encounter requirements with this patient on this date. The encounter with the patient was in whole or in part for the following MEDICAL CONDITION: (primary reason for Home Healthcare) MEDICAL NECESSITY: I certify, that based on my findings, NURSING services are a medically necessary home health service. HOME BOUND STATUS:  I certify that my clinical findings support that this patient is homebound (i.e., Due to illness or injury, pt requires aid of supportive devices such as crutches, cane, wheelchairs, walkers, the use of special transportation or the assistance of another person to leave their place of residence. There is a [name], [name] ([id number]) normal inability to leave the home and doing so requires considerable and taxing effort. Other absences are for medical reasons / religious services and are infrequent or of short duration when for other reasons). o If current dressing causes regression in wound condition, may D/C ordered dressing product/s and apply Normal Saline Moist Dressing daily until next Wound Healing Center / Other MD appointment. Notify Wound Healing Center of regression in wound condition at [phone]. o Please direct any NON-WOUND related issues/requests for orders to patient's Primary Care Physician Wound #6 Left,Medial Metatarsal head first o Continue Home Health Visits - [facility] o Home Health Nurse may visit PRN to address patientos wound care needs. o FACE TO FACE ENCOUNTER: MEDICARE and MEDICAID PATIENTS: I certify that this patient is under my care and that I had a  face-to-face encounter that meets the physician face-to-face encounter requirements with this patient on this date. The encounter with the patient was in whole or in part for the following MEDICAL CONDITION: (primary reason for Home Healthcare) MEDICAL NECESSITY: I certify, that based on my findings, NURSING services are a medically necessary home health service. HOME BOUND STATUS: I certify that my clinical findings support that this patient is homebound (i.e., Due to illness or injury, pt requires aid of supportive devices such as crutches, cane, wheelchairs, walkers, the use of special transportation or the assistance of another person to leave their place of residence. There is a normal inability to leave the home and doing so requires considerable and taxing effort. Other absences are for medical reasons / religious services and are infrequent or of short duration when for other reasons). o If current dressing causes regression in wound condition, may D/C ordered dressing product/s and apply Normal Saline Moist Dressing daily until next Wound Healing Center / Other MD appointment. Notify Wound Healing Center of regression in wound condition at [phone]. o Please direct any NON-WOUND related issues/requests for orders to patient's Primary Care Physician Electronic Signature(s) Signed: 01/22/2016 4:20:18 PM By: Evlyn Kanner MD, FACS Signed: 01/22/2016 4:57:30 PM By: Curtis Sites Entered By: Curtis Sites on 01/22/2016 11:27:31 [name] ([id number]) -------------------------------------------------------------------------------- Problem List Details Patient Name: ELTON, CATALANO 01/22/2016 10:45 Date of Service: AM Medical Record [id number] Number: Patient Account Number: [id number] 06/11/1929 (82 y.o. Treating RN: Phillis Haggis Date of Birth/Sex: Female) Other Clinician: Primary Care Physician: SYSTEM, PCP Treating Katessa Attridge Referring  Physician: Physician/Extender: Tania Ade in Treatment: 7 Active Problems ICD-10 Encounter Code Description Active Date Diagnosis S41.101A Unspecified open wound of right upper arm, initial 12/04/2015 Yes encounter L89.629 Pressure ulcer of left heel, unspecified stage 12/04/2015 Yes I50.40 Unspecified combined systolic (congestive) and diastolic 12/04/2015 Yes (congestive) heart failure I89.0 Lymphedema, not elsewhere classified 12/04/2015 Yes Inactive Problems Resolved Problems ICD-10 Code Description Active Date Resolved Date L89.322 Pressure ulcer of left buttock, stage 2 12/25/2015 12/25/2015 S41.102A Unspecified open wound of left upper arm, initial 12/25/2015 12/25/2015 encounter Electronic Signature(s) Signed: 01/22/2016 11:37:33 AM By: Evlyn Kanner MD, FACS Entered By: Evlyn Kanner on 01/22/2016 11:37:33 SHAVONTAE, GIBEAULT ([id number]) [name] ([id number]) -------------------------------------------------------------------------------- Progress Note Details Patient Name: SIGNE, TACKITT 01/22/2016 10:45 Date of Service: AM Medical Record [id number] Number: Patient Account Number: [id number] [birth year] (82 y.o. Treating  RN: Phillis Haggis Date of Birth/Sex: Female) Other Clinician: Primary Care Physician: SYSTEM, PCP Treating Madeleine Fenn Referring Physician: Physician/Extender: Tania Ade in Treatment: 7 Subjective Chief Complaint Information obtained from Patient Patient seen for complaints of Non-Healing Wound to the right upper arm laterally near the elbow for about a month. She also has some skin changes on her left heel. History of Present Illness (HPI) The following HPI elements were documented for the patient's wound: Location: wound on her right arm laterally near the elbow Quality: Patient reports experiencing a dull pain to affected area(s). Severity: Patient states wound are getting worse. Duration: Patient has had the wound for < 4 weeks prior to presenting for  treatment Timing: Pain in wound is Intermittent (comes and goes Context: The wound appeared gradually over time Modifying Factors: Other treatment(s) tried include:local care with dressing changes and has recently been on clindamycin Associated Signs and Symptoms: Patient reports having increase swelling all over the body including upper arms and legs. 82 year old patient was been referred to [place] for a ulcerated area on her right elbow. She has a past medical history of CHF, atrial fibrillation, hypertension, gait instability, bilateral lower extremity edema, morbid obesity, altered mental status. She has never been a smoker. her cardiologist is Dr. Julien Nordmann, who last saw her in March 2017.he has been seeing her with a history of hypertension, gait instability, cognitive impairment, A. fib with RVR and chronic systolic CHF. she is on Eliquis 5 mg twice a day and for the bilateral lower extremity lymphedema he had recommended elevation and compression hose. 12/11/2015 -- x-ray of the right elbow shows no evidence of fracture or bony pathology and the joint spaces are intact. The patient has held her anticoagulation for the last 72 hours. 12/18/2015 -- the patient was seen in the ER for fluid overload and breathlessness and appropriate management was done including increasing her dosage of Lasix after an appropriate workup. Details of this have been noted from the ER record -- chest x-ray did not reveal any acute abnormality and the labs were reviewed. She was put on Lasix 40 mg twice a day for 3 days and she will be on a steroid taper. She was also advised breathing treatments twice a day and to follow-up with her cardiologist. KAILAN, CRUMLEY ([id number]) 12/25/2015 -- she continues to have extensive lower extremity edema and has several new areas on both lower legs plus the left buttock and left elbow. These are all new since last week. 01/15/2016 -- continues to be under the care of her  cardiologist regarding her Lasix dosage and continues to have a mild compression on her lower extremities as much as she can tolerate it. Objective Constitutional Pulse regular. Respirations normal and unlabored. Afebrile. Vitals Time Taken: 10:57 AM, Height: 64 in, Weight: 153 lbs, BMI: 26.3, Pulse: 65 bpm, Respiratory Rate: 16 breaths/min, Blood Pressure: 120/64 mmHg. Eyes Nonicteric. Reactive to light. Ears, Nose, Mouth, and Throat Lips, teeth, and gums WNL.Marland Kitchen Moist mucosa without lesions. Neck supple and nontender. No palpable supraclavicular or cervical adenopathy. Normal sized without goiter. Respiratory WNL. No retractions.. Breath sounds WNL, No rubs, rales, rhonchi, or wheeze.. Cardiovascular Heart rhythm and rate regular, no murmur or gallop.. Pedal Pulses WNL. No clubbing, cyanosis or edema. Lymphatic No adneopathy. No adenopathy. No adenopathy. Musculoskeletal Adexa without tenderness or enlargement.. Digits and nails w/o clubbing, cyanosis, infection, petechiae, ischemia, or inflammatory conditions.Marland Kitchen Psychiatric Judgement and insight Intact.. No evidence of depression, anxiety, or agitation.. General Notes: the left elbow is  completely healed. The right elbow is looking much cleaner and has minimal slough at the depth and there is no undermining. If he continues to have some macerated skin and subcutaneous debris and this was sharply removed with forceps and scissors and bleeding controlled with pressure [name], [name] ([id number]) Integumentary (Hair, Skin) No suspicious lesions. No crepitus or fluctuance. No peri-wound warmth or erythema. No masses.. Wound #1 status is Open. Original cause of wound was Gradually Appeared. The wound is located on the Right Elbow. The wound measures 1.5cm length x 1.5cm width x 1.5cm depth; 1.767cm^2 area and 2.651cm^3 volume. The wound is limited to skin breakdown. There is no tunneling or undermining noted. There is a large amount of  serous drainage noted. The wound margin is flat and intact. There is medium (34-66%) pink granulation within the wound bed. There is a medium (34-66%) amount of necrotic tissue within the wound bed including Adherent Slough. The periwound skin appearance exhibited: Localized Edema, Moist, Erythema. The surrounding wound skin color is noted with erythema which is circumferential. Periwound temperature was noted as No Abnormality. The periwound has tenderness on palpation. Wound #10 status is Healed - Epithelialized. Original cause of wound was Trauma. The wound is located on the Left Elbow. The wound measures 0cm length x 0cm width x 0cm depth; 0cm^2 area and 0cm^3 volume. Wound #2 status is Open. Original cause of wound was Gradually Appeared. The wound is located on the Left Calcaneus. The wound measures 3.5cm length x 4.5cm width x 0.2cm depth; 12.37cm^2 area and 2.474cm^3 volume. The wound is limited to skin breakdown. There is no tunneling or undermining noted. There is a large amount of serous drainage noted. The wound margin is flat and intact. There is medium (34-66%) pink granulation within the wound bed. There is a medium (34-66%) amount of necrotic tissue within the wound bed including Eschar and Adherent Slough. The periwound skin appearance exhibited: Localized Edema, Maceration, Moist. The periwound skin appearance did not exhibit: Callus, Crepitus, Excoriation, Fluctuance, Friable, Induration, Rash, Scarring, Dry/Scaly, Atrophie Blanche, Cyanosis, Ecchymosis, Hemosiderin Staining, Mottled, Pallor, Rubor, Erythema. Periwound temperature was noted as No Abnormality. The periwound has tenderness on palpation. Wound #6 status is Open. Original cause of wound was Pressure Injury. The wound is located on the Left,Medial Metatarsal head first. The wound measures 0.1cm length x 0.1cm width x 0.1cm depth; 0.008cm^2 area and 0.001cm^3 volume. The wound is limited to skin breakdown. There is  no tunneling or undermining noted. There is a small amount of serosanguineous drainage noted. The wound margin is distinct with the outline attached to the wound base. There is medium (34-66%) pink, pale granulation within the wound bed. There is a small (1-33%) amount of necrotic tissue within the wound bed including Adherent Slough. The periwound skin appearance exhibited: Dry/Scaly, Erythema. The periwound skin appearance did not exhibit: Localized Edema, Maceration. The surrounding wound skin color is noted with erythema which is circumferential. Periwound temperature was noted as No Abnormality. Wound #8 status is Healed - Epithelialized. Original cause of wound was Pressure Injury. The wound is located on the Left,Medial Gluteus. The wound measures 0cm length x 0cm width x 0cm depth; 0cm^2 area and 0cm^3 volume. Assessment Active Problems ICD-10 S41.101A - Unspecified open wound of right upper arm, initial encounter Crehan, Mazi ([id number]) L89.629 - Pressure ulcer of left heel, unspecified stage I50.40 - Unspecified combined systolic (congestive) and diastolic (congestive) heart failure I89.0 - Lymphedema, not elsewhere classified Procedures Wound #2 Wound #2 is a Pressure  Ulcer located on the Left Calcaneus . There was a Skin/Subcutaneous Tissue Debridement ([id number]) debridement with total area of 15.75 sq cm performed by Evlyn Kanner, MD. with the following instrument(s): Forceps and Scissors to remove Viable and Non-Viable tissue/material including Fibrin/Slough, Eschar, Skin, and Subcutaneous after achieving pain control using Lidocaine 4% Topical Solution. A time out was conducted prior to the start of the procedure. A Minimum amount of bleeding was controlled with Pressure. The procedure was tolerated well with a pain level of 0 throughout and a pain level of 0 following the procedure. Post Debridement Measurements: 3.5cm length x 4.5cm width x 0.2cm depth; 2.474cm^3  volume. Post debridement Stage noted as Category/Stage III. Post procedure Diagnosis Wound #2: Same as Pre-Procedure Plan Wound Cleansing: Wound #1 Right Elbow: Clean wound with Normal Saline. Wound #2 Left Calcaneus: Clean wound with Normal Saline. Wound #6 Left,Medial Metatarsal head first: Clean wound with Normal Saline. Anesthetic: Wound #1 Right Elbow: Topical Lidocaine 4% cream applied to wound bed prior to debridement - for clinic use Wound #2 Left Calcaneus: Topical Lidocaine 4% cream applied to wound bed prior to debridement - for clinic use Wound #6 Left,Medial Metatarsal head first: Topical Lidocaine 4% cream applied to wound bed prior to debridement - for clinic use Skin Barriers/Peri-Wound Care: Wound #1 Right Elbow: Skin Prep Wound #2 Left Calcaneus: Skin Prep Wound #6 Left,Medial Metatarsal head first: Skin Prep Keithley, Kyrene ([id number]) Primary Wound Dressing: Wound #1 Right Elbow: Santyl Ointment Wound #2 Left Calcaneus: Santyl Ointment Wound #6 Left,Medial Metatarsal head first: Boardered Foam Dressing Secondary Dressing: Wound #1 Right Elbow: Boardered Foam Dressing Wound #2 Left Calcaneus: Boardered Foam Dressing Dressing Change Frequency: Wound #1 Right Elbow: Change dressing every day. Wound #2 Left Calcaneus: Change dressing every day. Wound #6 Left,Medial Metatarsal head first: Change dressing every other day. Follow-up Appointments: Wound #1 Right Elbow: Return Appointment in 1 week. Wound #2 Left Calcaneus: Return Appointment in 1 week. Wound #6 Left,Medial Metatarsal head first: Return Appointment in 1 week. Edema Control: Wound #1 Right Elbow: Elevate legs to the level of the heart and pump ankles as often as possible Wound #2 Left Calcaneus: Elevate legs to the level of the heart and pump ankles as often as possible Wound #6 Left,Medial Metatarsal head first: Elevate legs to the level of the heart and pump ankles as often as  possible Off-Loading: Wound #1 Right Elbow: Turn and reposition every 2 hours Wound #2 Left Calcaneus: Turn and reposition every 2 hours Wound #6 Left,Medial Metatarsal head first: Turn and reposition every 2 hours Additional Orders / Instructions: Wound #1 Right Elbow: Increase protein intake. Wound #2 Left Calcaneus: Increase protein intake. Wound #6 Left,Medial Metatarsal head first: Increase protein intake. Home Health: Wound #1 Right Elbow: Continue Home Health Visits - [facility] Nurse may visit PRN to address patient s wound care needs. DEMIANA, CRUMBLEY ([id number]) FACE TO FACE ENCOUNTER: MEDICARE and MEDICAID PATIENTS: I certify that this patient is under my care and that I had a face-to-face encounter that meets the physician face-to-face encounter requirements with this patient on this date. The encounter with the patient was in whole or in part for the following MEDICAL CONDITION: (primary reason for Home Healthcare) MEDICAL NECESSITY: I certify, that based on my findings, NURSING services are a medically necessary home health service. HOME BOUND STATUS: I certify that my clinical findings support that this patient is homebound (i.e., Due to illness or injury, pt requires aid of supportive devices such as  crutches, cane, wheelchairs, walkers, the use of special transportation or the assistance of another person to leave their place of residence. There is a normal inability to leave the home and doing so requires considerable and taxing effort. Other absences are for medical reasons / religious services and are infrequent or of short duration when for other reasons). If current dressing causes regression in wound condition, may D/C ordered dressing product/s and apply Normal Saline Moist Dressing daily until next Wound Healing Center / Other MD appointment. Notify Wound Healing Center of regression in wound condition at [phone]. Please direct any NON-WOUND  related issues/requests for orders to patient's Primary Care Physician Wound #2 Left Calcaneus: Continue Home Health Visits - [facility] Nurse may visit PRN to address patient s wound care needs. FACE TO FACE ENCOUNTER: MEDICARE and MEDICAID PATIENTS: I certify that this patient is under my care and that I had a face-to-face encounter that meets the physician face-to-face encounter requirements with this patient on this date. The encounter with the patient was in whole or in part for the following MEDICAL CONDITION: (primary reason for Home Healthcare) MEDICAL NECESSITY: I certify, that based on my findings, NURSING services are a medically necessary home health service. HOME BOUND STATUS: I certify that my clinical findings support that this patient is homebound (i.e., Due to illness or injury, pt requires aid of supportive devices such as crutches, cane, wheelchairs, walkers, the use of special transportation or the assistance of another person to leave their place of residence. There is a normal inability to leave the home and doing so requires considerable and taxing effort. Other absences are for medical reasons / religious services and are infrequent or of short duration when for other reasons). If current dressing causes regression in wound condition, may D/C ordered dressing product/s and apply Normal Saline Moist Dressing daily until next Wound Healing Center / Other MD appointment. Notify Wound Healing Center of regression in wound condition at [phone]. Please direct any NON-WOUND related issues/requests for orders to patient's Primary Care Physician Wound #6 Left,Medial Metatarsal head first: Continue Home Health Visits - [facility] Nurse may visit PRN to address patient s wound care needs. FACE TO FACE ENCOUNTER: MEDICARE and MEDICAID PATIENTS: I certify that this patient is under my care and that I had a face-to-face encounter that meets the physician  face-to-face encounter requirements with this patient on this date. The encounter with the patient was in whole or in part for the following MEDICAL CONDITION: (primary reason for Home Healthcare) MEDICAL NECESSITY: I certify, that based on my findings, NURSING services are a medically necessary home health service. HOME BOUND STATUS: I certify that my clinical findings support that this patient is homebound (i.e., Due to illness or injury, pt requires aid of supportive devices such as crutches, cane, wheelchairs, walkers, the use of special transportation or the assistance of another person to leave their place of residence. There is a normal inability to leave the home and doing so requires considerable and taxing effort. Other absences are for medical reasons / religious services and are infrequent or of short duration when for other reasons). If current dressing causes regression in wound condition, may D/C ordered dressing product/s and apply Normal Saline Moist Dressing daily until next Wound Healing Center / Other MD appointment. Notify Wound Healing Center of regression in wound condition at [phone]. Please direct any NON-WOUND related issues/requests for orders to patient's Primary Care Physician [place], [place] ([id number]) I have recommended: 1.  Santyl ointment locally to the right elbow area, and the left heel wound, which should be applied daily. 2. We have discussed offloading techniques in detail for the lower extremities and at the present time will apply some Santyl ointment to the left heels. 3. Nutrition, protein supplements and vitamin supplements have also been discussed and the daughter says the nursing staff of being very compliant. Electronic Signature(s) Signed: 01/22/2016 11:40:43 AM By: Evlyn Kanner MD, FACS Entered By: Evlyn Kanner on 01/22/2016 11:40:43 [name]  ([id number]) -------------------------------------------------------------------------------- SuperBill Details Patient Name: [name] Date of Service: 01/22/2016 Medical Record Number: [id number] Patient Account Number: [id number] Date of Birth/Sex: [birth year] (82 y.o. Female) Treating RN: Ashok Cordia, Debi Primary Care Physician: SYSTEM, PCP Other Clinician: Referring Physician: Treating Physician/Extender: Rudene Re in Treatment: 7 Diagnosis Coding ICD-10 Codes Code Description S41.101A Unspecified open wound of right upper arm, initial encounter L89.629 Pressure ulcer of left heel, unspecified stage I50.40 Unspecified combined systolic (congestive) and diastolic (congestive) heart failure I89.0 Lymphedema, not elsewhere classified Facility Procedures CPT4: Description Modifier Quantity Code [id number] 11042 - DEB SUBQ TISSUE 20 SQ CM/< 1 ICD-10 Description Diagnosis S41.101A Unspecified open wound of right upper arm, initial encounter L89.629 Pressure ulcer of left heel, unspecified stage I50.40  Unspecified combined systolic (congestive) and diastolic (congestive) heart failure Physician Procedures CPT4: Description Modifier Quantity Code [id number] 11042 - WC PHYS SUBQ TISS 20 SQ CM 1 ICD-10 Description Diagnosis S41.101A Unspecified open wound of right upper arm, initial encounter L89.629 Pressure ulcer of left heel, unspecified stage I50.40  Unspecified combined systolic (congestive) and diastolic (congestive) heart failure Electronic Signature(s) Signed: 01/22/2016 11:40:54 AM By: Evlyn Kanner MD, FACS Entered By: Evlyn Kanner on 01/22/2016 11:40:54

## 2016-01-23 NOTE — Progress Notes (Signed)
[name], [name] ([id number]) Visit Report for 01/22/2016 Arrival Information Details Patient Name: [name] Date of Service: 01/22/2016 10:45 AM Medical Record Number: [id number] Patient Account Number: [id number] Date of Birth/Sex: 21-Sep-1928 (82 y.o. Female) Treating RN: Ashok Cordia, Debi Primary Care Physician: SYSTEM, PCP Other Clinician: Referring Physician: Treating Physician/Extender: Rudene Re in Treatment: 7 Visit Information History Since Last Visit All ordered tests and consults were completed: No Patient Arrived: Wheel Chair Added or deleted any medications: No Arrival Time: 10:57 Any new allergies or adverse reactions: No Accompanied By: sister Had a fall or experienced change in No Transfer Assistance: Nurse, adult activities of daily living that may affect Patient Identification Verified: Yes risk of falls: Secondary Verification Process Yes Signs or symptoms of abuse/neglect since last No Completed: visito Patient Requires Transmission- No Hospitalized since last visit: No Based Precautions: Pain Present Now: No Patient Has Alerts: Yes Patient Alerts: Patient on Blood Thinner ABI: (L) 1.56 Eliquis Electronic Signature(s) Signed: 01/22/2016 5:19:00 PM By: Alejandro Mulling Entered By: Alejandro Mulling on 01/22/2016 10:57:25 [name] ([id number]) -------------------------------------------------------------------------------- Encounter Discharge Information Details Patient Name: [name] Date of Service: 01/22/2016 10:45 AM Medical Record Number: [id number] Patient Account Number: [id number] Date of Birth/Sex: 1928/11/25 (82 y.o. Female) Treating RN: Curtis Sites Primary Care Physician: SYSTEM, PCP Other Clinician: Referring Physician: Treating Physician/Extender: Rudene Re in Treatment: 7 Encounter Discharge Information Items Discharge Pain Level: 0 Discharge Condition: Stable Ambulatory Status: Wheelchair Discharge Destination:  Nursing Home Transportation: Private Auto Accompanied By: dtr Schedule Follow-up Appointment: Yes Medication Reconciliation completed and provided to Patient/Care No [name]: Provided on Clinical Summary of Care: 01/22/2016 Form Type Recipient Paper Patient MJ Electronic Signature(s) Signed: 01/22/2016 11:44:49 AM By: Gwenlyn Perking Entered By: Gwenlyn Perking on 01/22/2016 11:44:49 [name] ([id number]) -------------------------------------------------------------------------------- Lower Extremity Assessment Details Patient Name: [name] Date of Service: 01/22/2016 10:45 AM Medical Record Number: [id number] Patient Account Number: [id number] Date of Birth/Sex: 1929-05-04 (82 y.o. Female) Treating RN: Ashok Cordia, Debi Primary Care Physician: SYSTEM, PCP Other Clinician: Referring Physician: Treating Physician/Extender: Rudene Re in Treatment: 7 Vascular Assessment Pulses: Posterior Tibial Dorsalis Pedis Palpable: [Right:No] Doppler: [Right:Monophasic] Extremity colors, hair growth, and conditions: Extremity Color: [Right:Mottled] Temperature of Extremity: [Right:Warm] Capillary Refill: [Right:< 3 seconds] Toe Nail Assessment Left: Right: Thick: Yes Discolored: Yes Deformed: No Improper Length and Hygiene: No Electronic Signature(s) Signed: 01/22/2016 5:19:00 PM By: Alejandro Mulling Entered By: Alejandro Mulling on 01/22/2016 11:49:23 [name], Kirk ([id number]) -------------------------------------------------------------------------------- Multi Wound Chart Details Patient Name: [name] Date of Service: 01/22/2016 10:45 AM Medical Record Number: [id number] Patient Account Number: [id number] Date of Birth/Sex: Nov 27, 1928 (82 y.o. Female) Treating RN: Ashok Cordia, Debi Primary Care Physician: SYSTEM, PCP Other Clinician: Referring Physician: Treating Physician/Extender: Rudene Re in Treatment: 7 Vital Signs Height(in): 64 Pulse(bpm):  65 Weight(lbs): 153 Blood Pressure 120/64 (mmHg): Body Mass Index(BMI): 26 Temperature(F): Respiratory Rate 16 (breaths/min): Photos: [1:No Photos] [10:No Photos] [2:No Photos] Wound Location: [1:Right Elbow] [10:Left Elbow] [2:Left Calcaneus] Wounding Event: [1:Gradually Appeared] [10:Trauma] [2:Gradually Appeared] Primary Etiology: [1:Infection - not elsewhere classified] [10:Trauma, Other] [2:Pressure Ulcer] Comorbid History: [1:Cataracts, Lymphedema, Aspiration, Chronic Obstructive Pulmonary Disease (COPD), Arrhythmia, Congestive Heart Failure, Hypertension, Peripheral Venous Disease, Osteoarthritis, Dementia] [10:N/A] [2:Cataracts, Lymphedema, Aspiration,  Chronic Obstructive Pulmonary Disease (COPD), Arrhythmia, Congestive Heart Failure, Hypertension, Peripheral Venous Disease, Osteoarthritis, Dementia] Date Acquired: [1:11/14/2015] [10:01/14/2016] [2:10/30/2015] Weeks of Treatment: [1:7] [10:1] [2:6] Wound Status: [1:Open] [10:Healed - Epithelialized] [2:Open] Measurements L x W x D 1.5x1.5x1.5 [10:0x0x0] [2:3.5x4.5x0.2] (cm) Area (cm) : [1:1.767] [10:0] [2:12.37] Volume (cm) : [  1:2.651] [10:0] [2:2.474] % Reduction in Area: [1:83.90%] [10:100.00%] [2:-162.50%] % Reduction in Volume: 85.80% [10:100.00%] [2:-425.30%] Classification: [1:Full Thickness Without Exposed Support Structures] [10:Partial Thickness] [2:Category/Stage III] Exudate Amount: [1:Large] [10:N/A] [2:Large] Exudate Type: [1:Serous] [10:N/A] [2:Serous] Exudate Color: [1:amber Yes] [10:N/A N/A] [2:amber No] Foul Odor After Cleansing: Odor Anticipated Due to No N/A N/A Product Use: Wound Margin: Flat and Intact N/A Flat and Intact Granulation Amount: Medium (34-66%) N/A Medium (34-66%) Granulation Quality: Pink N/A Pink Necrotic Amount: Medium (34-66%) N/A Medium (34-66%) Necrotic Tissue: Adherent Slough N/A Eschar, Adherent Slough Exposed Structures: Fascia: No N/A Fascia: No Fat: No Fat: No Tendon:  No Tendon: No Muscle: No Muscle: No Joint: No Joint: No Bone: No Bone: No Limited to Skin Limited to Skin Breakdown Breakdown Epithelialization: None N/A None Periwound Skin Texture: Edema: Yes No Abnormalities Noted Edema: Yes Excoriation: No Induration: No Callus: No Crepitus: No Fluctuance: No Friable: No Rash: No Scarring: No Periwound Skin Moist: Yes No Abnormalities Noted Maceration: Yes Moisture: Moist: Yes Dry/Scaly: No Periwound Skin Color: Erythema: Yes No Abnormalities Noted Atrophie Blanche: No Cyanosis: No Ecchymosis: No Erythema: No Hemosiderin Staining: No Mottled: No Pallor: No Rubor: No Erythema Location: Circumferential N/A N/A Erythema Change: N/A N/A N/A Temperature: No Abnormality N/A No Abnormality Tenderness on Yes No Yes Palpation: Wound Preparation: Ulcer Cleansing: N/A Ulcer Cleansing: Rinsed/Irrigated with Rinsed/Irrigated with Saline Saline Topical Anesthetic Topical Anesthetic Applied: Other: lidocaine Applied: Other: lidocaine 4% 4% Maudlin, Page ([id number]) Wound Number: 6 8 N/A Photos: No Photos No Photos N/A Wound Location: Left Metatarsal head first - Left, Medial Gluteus N/A Medial Wounding Event: Pressure Injury Pressure Injury N/A Primary Etiology: Pressure Ulcer Pressure Ulcer N/A Comorbid History: Cataracts, Lymphedema, N/A N/A Aspiration, Chronic Obstructive Pulmonary Disease (COPD), Arrhythmia, Congestive Heart Failure, Hypertension, Peripheral Venous Disease, Osteoarthritis, Dementia Date Acquired: 12/25/2015 12/11/2015 N/A Weeks of Treatment: 4 4 N/A Wound Status: Open Healed - Epithelialized N/A Measurements L x W x D 0.1x0.1x0.1 0x0x0 N/A (cm) Area (cm) : 0.008 0 N/A Volume (cm) : 0.001 0 N/A % Reduction in Area: 98.70% 100.00% N/A % Reduction in Volume: 98.40% 100.00% N/A Classification: Category/Stage II Category/Stage II N/A Exudate Amount: Small N/A N/A Exudate Type: Serosanguineous N/A  N/A Exudate Color: red, brown N/A N/A Foul Odor After No N/A N/A Cleansing: Odor Anticipated Due to N/A N/A N/A Product Use: Wound Margin: Distinct, outline attached N/A N/A Granulation Amount: Medium (34-66%) N/A N/A Granulation Quality: Pink, Pale N/A N/A Necrotic Amount: Small (1-33%) N/A N/A Necrotic Tissue: Adherent Slough N/A N/A Exposed Structures: Fascia: No N/A N/A Fat: No Tendon: No Muscle: No Joint: No Bone: No Limited to Skin Breakdown Epithelialization: Medium (34-66%) N/A N/A Periwound Skin Texture: Edema: No No Abnormalities Noted N/A Periwound Skin Dry/Scaly: Yes No Abnormalities Noted N/A Moisture: Maceration: No Donahue, Terika ([id number]) Periwound Skin Color: Erythema: Yes No Abnormalities Noted N/A Erythema Location: Circumferential N/A N/A Erythema Change: Decreased N/A N/A Temperature: No Abnormality N/A N/A Tenderness on No No N/A Palpation: Wound Preparation: Ulcer Cleansing: N/A N/A Rinsed/Irrigated with Saline Topical Anesthetic Applied: Other: lidocaine 4% Treatment Notes Electronic Signature(s) Signed: 01/22/2016 5:19:00 PM By: Alejandro Mulling Entered By: Alejandro Mulling on 01/22/2016 11:16:35 [name] ([id number]) -------------------------------------------------------------------------------- Multi-Disciplinary Care Plan Details Patient Name: [name] Date of Service: 01/22/2016 10:45 AM Medical Record Number: [id number] Patient Account Number: [id number] Date of Birth/Sex: 03-Oct-1928 (82 y.o. Female) Treating RN: Ashok Cordia, Debi Primary Care Physician: SYSTEM, PCP Other Clinician: Referring Physician: Treating Physician/Extender: Rudene Re in Treatment: 7 Active  Inactive Abuse / Safety / Falls / Self Care Management Nursing Diagnoses: Potential for falls Goals: Patient will remain injury free Date Initiated: 12/04/2015 Goal Status: Active Interventions: Assess fall risk on admission and as  needed Notes: Nutrition Nursing Diagnoses: Potential for alteratiion in Nutrition/Potential for imbalanced nutrition Goals: Patient/caregiver agrees to and verbalizes understanding of need to obtain nutritional consultation Date Initiated: 12/04/2015 Goal Status: Active Interventions: Provide education on nutrition Treatment Activities: Education provided on Nutrition : 01/01/2016 Notes: Orientation to the Wound Care Program Nursing Diagnoses: Knowledge deficit related to the wound healing center program DAYLEN, HACK ([id number]) Goals: Patient/caregiver will verbalize understanding of the Wound Healing Center Program Date Initiated: 12/04/2015 Goal Status: Active Interventions: Provide education on orientation to the wound center Notes: Venous Leg Ulcer Nursing Diagnoses: Actual venous Insuffiency (use after diagnosis is confirmed) Goals: Verify adequate tissue perfusion prior to therapeutic compression application Date Initiated: 12/04/2015 Goal Status: Active Interventions: Provide education on venous insufficiency Notes: Wound/Skin Impairment Nursing Diagnoses: Impaired tissue integrity Goals: Ulcer/skin breakdown will heal within 14 weeks Date Initiated: 12/04/2015 Goal Status: Active Interventions: Assess patient/caregiver ability to obtain necessary supplies Notes: Electronic Signature(s) Signed: 01/22/2016 5:19:00 PM By: Alejandro Mulling Entered By: Alejandro Mulling on 01/22/2016 11:16:24 [name] ([id number]) -------------------------------------------------------------------------------- Pain Assessment Details Patient Name: [name] Date of Service: 01/22/2016 10:45 AM Medical Record Number: [id number] Patient Account Number: [id number] Date of Birth/Sex: Dec 16, 1928 (82 y.o. Female) Treating RN: Ashok Cordia, Debi Primary Care Physician: SYSTEM, PCP Other Clinician: Referring Physician: Treating Physician/Extender: Rudene Re in Treatment:  7 Active Problems Location of Pain Severity and Description of Pain Patient Has Paino No Site Locations With Dressing Change: No Pain Management and Medication Current Pain Management: Electronic Signature(s) Signed: 01/22/2016 5:19:00 PM By: Alejandro Mulling Entered By: Alejandro Mulling on 01/22/2016 10:57:32 [name] ([id number]) -------------------------------------------------------------------------------- Patient/Caregiver Education Details Patient Name: [name] Date of Service: 01/22/2016 10:45 AM Medical Record Number: [id number] Patient Account Number: [id number] Date of Birth/Gender: December 12, 1928 (82 y.o. Female) Treating RN: Curtis Sites Primary Care Physician: SYSTEM, PCP Other Clinician: Referring Physician: Treating Physician/Extender: Rudene Re in Treatment: 7 Education Assessment Education Provided To: Caregiver Education Topics Provided Wound/Skin Impairment: Handouts: Other: wound care as ordered Methods: Demonstration, Explain/Verbal Responses: State content correctly Electronic Signature(s) Signed: 01/22/2016 4:57:30 PM By: Curtis Sites Entered By: Curtis Sites on 01/22/2016 11:30:31 [name] ([id number]) -------------------------------------------------------------------------------- Wound Assessment Details Patient Name: [name] Date of Service: 01/22/2016 10:45 AM Medical Record Number: [id number] Patient Account Number: [id number] Date of Birth/Sex: July 25, 1928 (82 y.o. Female) Treating RN: Ashok Cordia, Debi Primary Care Physician: SYSTEM, PCP Other Clinician: Referring Physician: Treating Physician/Extender: Rudene Re in Treatment: 7 Wound Status Wound Number: 1 Primary Infection - not elsewhere classified Etiology: Wound Location: Right Elbow Wound Open Wounding Event: Gradually Appeared Status: Date Acquired: 11/14/2015 Comorbid Cataracts, Lymphedema, Aspiration, Weeks Of Treatment: 7 History: Chronic  Obstructive Pulmonary Disease Clustered Wound: No (COPD), Arrhythmia, Congestive Heart Failure, Hypertension, Peripheral Venous Disease, Osteoarthritis, Dementia Photos Photo Uploaded By: Alejandro Mulling on 01/22/2016 14:20:24 Wound Measurements Length: (cm) 1.5 Width: (cm) 1.5 Depth: (cm) 1.5 Area: (cm) 1.767 Volume: (cm) 2.651 % Reduction in Area: 83.9% % Reduction in Volume: 85.8% Epithelialization: None Tunneling: No Undermining: No Wound Description Full Thickness Without Exposed Foul Odor Af Classification: Support Structures Due to Produ Wound Margin: Flat and Intact Exudate Large Amount: Exudate Type: Serous Exudate Color: amber ter Cleansing: Yes ct Use: No Wound Bed Guarisco, Teri ([id number]) Granulation Amount: Medium (34-66%) Exposed Structure Granulation Quality: Pink Fascia  Exposed: No Necrotic Amount: Medium (34-66%) Fat Layer Exposed: No Necrotic Quality: Adherent Slough Tendon Exposed: No Muscle Exposed: No Joint Exposed: No Bone Exposed: No Limited to Skin Breakdown Periwound Skin Texture Texture Color No Abnormalities Noted: No No Abnormalities Noted: No Localized Edema: Yes Erythema: Yes Erythema Location: Circumferential Moisture No Abnormalities Noted: No Temperature / Pain Moist: Yes Temperature: No Abnormality Tenderness on Palpation: Yes Wound Preparation Ulcer Cleansing: Rinsed/Irrigated with Saline Topical Anesthetic Applied: Other: lidocaine 4%, Treatment Notes Wound #1 (Right Elbow) 1. Cleansed with: Clean wound with Normal Saline 2. Anesthetic Topical Lidocaine 4% cream to wound bed prior to debridement 3. Peri-wound Care: Skin Prep 4. Dressing Applied: Santyl Ointment 5. Secondary Dressing Applied Bordered Foam Dressing Dry Gauze Electronic Signature(s) Signed: 01/22/2016 5:19:00 PM By: Alejandro Mulling Entered By: Alejandro Mulling on 01/22/2016 11:11:13 [name]  ([id number]) -------------------------------------------------------------------------------- Wound Assessment Details Patient Name: [name] Date of Service: 01/22/2016 10:45 AM Medical Record Number: [id number] Patient Account Number: [id number] Date of Birth/Sex: [birth year] (82 y.o. Female) Treating RN: Ashok Cordia, Debi Primary Care Physician: SYSTEM, PCP Other Clinician: Referring Physician: Treating Physician/Extender: Rudene Re in Treatment: 7 Wound Status Wound Number: 10 Primary Etiology: Trauma, Other Wound Location: Left Elbow Wound Status: Healed - Epithelialized Wounding Event: Trauma Date Acquired: 01/14/2016 Weeks Of Treatment: 1 Clustered Wound: No Photos Photo Uploaded By: Alejandro Mulling on 01/22/2016 14:20:26 Wound Measurements Length: (cm) 0 % Reduction Width: (cm) 0 % Reduction Depth: (cm) 0 Area: (cm) 0 Volume: (cm) 0 in Area: 100% in Volume: 100% Wound Description Classification: Partial Thickness Periwound Skin Texture Texture Color No Abnormalities Noted: No No Abnormalities Noted: No Moisture No Abnormalities Noted: No Electronic Signature(s) Signed: 01/22/2016 5:19:00 PM By: Christiana Fuchs, Johnny Bridge ([id number]) Entered By: Alejandro Mulling on 01/22/2016 11:12:14 [name] ([id number]) -------------------------------------------------------------------------------- Wound Assessment Details Patient Name: [name] Date of Service: 01/22/2016 10:45 AM Medical Record Number: [id number] Patient Account Number: [id number] Date of Birth/Sex: 06/01/1929 (82 y.o. Female) Treating RN: Ashok Cordia, Debi Primary Care Physician: SYSTEM, PCP Other Clinician: Referring Physician: Treating Physician/Extender: Rudene Re in Treatment: 7 Wound Status Wound Number: 2 Primary Pressure Ulcer Etiology: Wound Location: Left Calcaneus Wound Open Wounding Event: Gradually Appeared Status: Date Acquired: 10/30/2015 Comorbid  Cataracts, Lymphedema, Aspiration, Weeks Of Treatment: 6 History: Chronic Obstructive Pulmonary Disease Clustered Wound: No (COPD), Arrhythmia, Congestive Heart Failure, Hypertension, Peripheral Venous Disease, Osteoarthritis, Dementia Photos Photo Uploaded By: Alejandro Mulling on 01/22/2016 14:21:00 Wound Measurements Length: (cm) 3.5 Width: (cm) 4.5 Depth: (cm) 0.2 Area: (cm) 12.37 Volume: (cm) 2.474 % Reduction in Area: -162.5% % Reduction in Volume: -425.3% Epithelialization: None Tunneling: No Undermining: No Wound Description Classification: Category/Stage III Wound Margin: Flat and Intact Exudate Amount: Large Exudate Type: Serous Exudate Color: amber Foul Odor After Cleansing: No Wound Bed Granulation Amount: Medium (34-66%) Exposed Structure Granulation Quality: Pink Fascia Exposed: No Sermon, Trezure ([id number]) Necrotic Amount: Medium (34-66%) Fat Layer Exposed: No Necrotic Quality: Eschar, Adherent Slough Tendon Exposed: No Muscle Exposed: No Joint Exposed: No Bone Exposed: No Limited to Skin Breakdown Periwound Skin Texture Texture Color No Abnormalities Noted: No No Abnormalities Noted: No Callus: No Atrophie Blanche: No Crepitus: No Cyanosis: No Excoriation: No Ecchymosis: No Fluctuance: No Erythema: No Friable: No Hemosiderin Staining: No Induration: No Mottled: No Localized Edema: Yes Pallor: No Rash: No Rubor: No Scarring: No Temperature / Pain Moisture Temperature: No Abnormality No Abnormalities Noted: No Tenderness on Palpation: Yes Dry / Scaly: No Maceration: Yes Moist: Yes Wound Preparation Ulcer Cleansing: Rinsed/Irrigated with  Saline Topical Anesthetic Applied: Other: lidocaine 4%, Treatment Notes Wound #2 (Left Calcaneus) 1. Cleansed with: Clean wound with Normal Saline 2. Anesthetic Topical Lidocaine 4% cream to wound bed prior to debridement 3. Peri-wound Care: Skin Prep 4. Dressing Applied: Santyl  Ointment 5. Secondary Dressing Applied Bordered Foam Dressing Dry Gauze Electronic Signature(s) Signed: 01/22/2016 5:19:00 PM By: Alejandro Mulling Entered By: Alejandro Mulling on 01/22/2016 11:15:50 Biedermann, Roslin ([id number]) CHARO, PHILIPP ([id number]) -------------------------------------------------------------------------------- Wound Assessment Details Patient Name: [name] Date of Service: 01/22/2016 10:45 AM Medical Record Number: [id number] Patient Account Number: [id number] Date of Birth/Sex: 1929-05-13 (82 y.o. Female) Treating RN: Ashok Cordia, Debi Primary Care Physician: SYSTEM, PCP Other Clinician: Referring Physician: Treating Physician/Extender: Rudene Re in Treatment: 7 Wound Status Wound Number: 6 Primary Pressure Ulcer Etiology: Wound Location: Left Metatarsal head first - Medial Wound Open Status: Wounding Event: Pressure Injury Comorbid Cataracts, Lymphedema, Aspiration, Date Acquired: 12/25/2015 History: Chronic Obstructive Pulmonary Disease Weeks Of Treatment: 4 (COPD), Arrhythmia, Congestive Heart Clustered Wound: No Failure, Hypertension, Peripheral Venous Disease, Osteoarthritis, Dementia Photos Photo Uploaded By: Alejandro Mulling on 01/22/2016 14:21:02 Wound Measurements Length: (cm) 0.1 Width: (cm) 0.1 Depth: (cm) 0.1 Area: (cm) 0.008 Volume: (cm) 0.001 % Reduction in Area: 98.7% % Reduction in Volume: 98.4% Epithelialization: Medium (34-66%) Tunneling: No Undermining: No Wound Description Classification: Category/Stage II Wound Margin: Distinct, outline attached Exudate Amount: Small Exudate Type: Serosanguineous Exudate Color: red, brown Foul Odor After Cleansing: No Wound Bed Granulation Amount: Medium (34-66%) Exposed Structure Granulation Quality: Pink, Pale Fascia Exposed: No Chatham, Eyanna ([id number]) Necrotic Amount: Small (1-33%) Fat Layer Exposed: No Necrotic Quality: Adherent Slough Tendon Exposed: No Muscle  Exposed: No Joint Exposed: No Bone Exposed: No Limited to Skin Breakdown Periwound Skin Texture Texture Color No Abnormalities Noted: No No Abnormalities Noted: No Localized Edema: No Erythema: Yes Erythema Location: Circumferential Moisture Erythema Change: Decreased No Abnormalities Noted: No Dry / Scaly: Yes Temperature / Pain Maceration: No Temperature: No Abnormality Wound Preparation Ulcer Cleansing: Rinsed/Irrigated with Saline Topical Anesthetic Applied: Other: lidocaine 4%, Treatment Notes Wound #6 (Left, Medial Metatarsal head first) 1. Cleansed with: Clean wound with Normal Saline 2. Anesthetic Topical Lidocaine 4% cream to wound bed prior to debridement 3. Peri-wound Care: Skin Prep 5. Secondary Dressing Applied Bordered Foam Dressing Electronic Signature(s) Signed: 01/22/2016 5:19:00 PM By: Alejandro Mulling Entered By: Alejandro Mulling on 01/22/2016 11:16:09 [name] ([id number]) -------------------------------------------------------------------------------- Wound Assessment Details Patient Name: [name] Date of Service: 01/22/2016 10:45 AM Medical Record Number: [id number] Patient Account Number: [id number] Date of Birth/Sex: 1929/05/15 (82 y.o. Female) Treating RN: Ashok Cordia, Debi Primary Care Physician: SYSTEM, PCP Other Clinician: Referring Physician: Treating Physician/Extender: Rudene Re in Treatment: 7 Wound Status Wound Number: 8 Primary Etiology: Pressure Ulcer Wound Location: Left, Medial Gluteus Wound Status: Healed - Epithelialized Wounding Event: Pressure Injury Date Acquired: 12/11/2015 Weeks Of Treatment: 4 Clustered Wound: No Wound Measurements Length: (cm) 0 % Reduction Width: (cm) 0 % Reduction Depth: (cm) 0 Area: (cm) 0 Volume: (cm) 0 in Area: 100% in Volume: 100% Wound Description Classification: Category/Stage II Periwound Skin Texture Texture Color No Abnormalities Noted: No No Abnormalities  Noted: No Moisture No Abnormalities Noted: No Electronic Signature(s) Signed: 01/22/2016 5:19:00 PM By: Alejandro Mulling Entered By: Alejandro Mulling on 01/22/2016 11:05:01 [name] ([id number]) -------------------------------------------------------------------------------- Vitals Details Patient Name: [name] Date of Service: 01/22/2016 10:45 AM Medical Record Number: [id number] Patient Account Number: [id number] Date of Birth/Sex: 26-May-1929 (82 y.o. Female) Treating RN: Phillis Haggis Primary Care Physician: SYSTEM, PCP Other Clinician:  Referring Physician: Treating Physician/Extender: Rudene Re in Treatment: 7 Vital Signs Time Taken: 10:57 Pulse (bpm): 65 Height (in): 64 Respiratory Rate (breaths/min): 16 Weight (lbs): 153 Blood Pressure (mmHg): 120/64 Body Mass Index (BMI): 26.3 Reference Range: 80 - 120 mg / dl Electronic Signature(s) Signed: 01/22/2016 5:19:00 PM By: Alejandro Mulling Entered By: Alejandro Mulling on 01/22/2016 10:57:49

## 2016-01-29 ENCOUNTER — Encounter: Payer: Medicare Other | Admitting: Surgery

## 2016-01-29 DIAGNOSIS — S41101A Unspecified open wound of right upper arm, initial encounter: Secondary | ICD-10-CM | POA: Diagnosis not present

## 2016-01-29 NOTE — Progress Notes (Signed)
[name], [name] ([id number]) Visit Report for 01/29/2016 Arrival Information Details Patient Name: [name], [name] Date of Service: 01/29/2016 10:45 AM Medical Record Number: [id number] Patient Account Number: [id number] Date of Birth/Sex: June 29, 1929 (82 y.o. Female) Treating RN: Curtis Sites Primary Care Physician: SYSTEM, PCP Other Clinician: Referring Physician: Treating Physician/Extender: Rudene Re in Treatment: 8 Visit Information History Since Last Visit Added or deleted any medications: No Patient Arrived: Wheel Chair Any new allergies or adverse reactions: No Arrival Time: 11:10 Had a fall or experienced change in No Accompanied By: dtr activities of daily living that may affect Transfer Assistance: Michiel Sites Lift risk of falls: Patient Identification Verified: Yes Signs or symptoms of abuse/neglect since last No Secondary Verification Process Yes visito Completed: Hospitalized since last visit: No Patient Requires Transmission- No Pain Present Now: No Based Precautions: Patient Has Alerts: Yes Patient Alerts: Patient on Blood Thinner ABI: (L) 1.56 Eliquis Electronic Signature(s) Signed: 01/29/2016 4:29:04 PM By: Curtis Sites Entered By: Curtis Sites on 01/29/2016 11:10:49 [name] ([id number]) -------------------------------------------------------------------------------- Encounter Discharge Information Details Patient Name: [name] Date of Service: 01/29/2016 10:45 AM Medical Record Number: [id number] Patient Account Number: [id number] Date of Birth/Sex: August 02, 1928 (82 y.o. Female) Treating RN: Curtis Sites Primary Care Physician: SYSTEM, PCP Other Clinician: Referring Physician: Treating Physician/Extender: Rudene Re in Treatment: 8 Encounter Discharge Information Items Discharge Pain Level: 0 Discharge Condition: Stable Ambulatory Status: Wheelchair Discharge Destination: Home Transportation: Private Auto Accompanied By:  sister Schedule Follow-up Appointment: Yes Medication Reconciliation completed and provided to Patient/Care No [name]: Provided on Clinical Summary of Care: 01/29/2016 Form Type Recipient Paper Patient MJ Electronic Signature(s) Signed: 01/29/2016 12:04:49 PM By: Gwenlyn Perking Entered By: Gwenlyn Perking on 01/29/2016 12:04:49 [name] ([id number]) -------------------------------------------------------------------------------- Multi Wound Chart Details Patient Name: [name] Date of Service: 01/29/2016 10:45 AM Medical Record Number: [id number] Patient Account Number: [id number] Date of Birth/Sex: [birth year] (82 y.o. Female) Treating RN: Curtis Sites Primary Care Physician: SYSTEM, PCP Other Clinician: Referring Physician: Treating Physician/Extender: Rudene Re in Treatment: 8 Vital Signs Height(in): 64 Pulse(bpm): 68 Weight(lbs): 153 Blood Pressure 145/61 (mmHg): Body Mass Index(BMI): 26 Temperature(F): 97.6 Respiratory Rate 16 (breaths/min): Photos: [1:No Photos] [2:No Photos] [6:No Photos] Wound Location: [1:Right Elbow] [2:Left Calcaneus] [6:Left Metatarsal head first - Medial] Wounding Event: [1:Gradually Appeared] [2:Gradually Appeared] [6:Pressure Injury] Primary Etiology: [1:Infection - not elsewhere classified] [2:Pressure Ulcer] [6:Pressure Ulcer] Comorbid History: [1:Cataracts, Lymphedema, Aspiration, Chronic Obstructive Pulmonary Disease (COPD), Arrhythmia, Congestive Heart Failure, Hypertension, Peripheral Venous Disease, Osteoarthritis, Dementia] [2:Cataracts, Lymphedema, Aspiration, Chronic  Obstructive Pulmonary Disease (COPD), Arrhythmia, Congestive Heart Failure, Hypertension, Peripheral Venous Disease, Osteoarthritis, Dementia] [6:Cataracts, Lymphedema, Aspiration, Chronic Obstructive Pulmonary Disease (COPD), Arrhythmia, Congestive  Heart Failure, Hypertension, Peripheral Venous Disease, Osteoarthritis, Dementia] Date Acquired:  [1:11/14/2015] [2:10/30/2015] [6:12/25/2015] Weeks of Treatment: [1:8] [2:7] [6:5] Wound Status: [1:Open] [2:Open] [6:Open] Measurements L x W x D 2.3x2x0.8 [2:2.4x5.5x0.2] [6:0.2x0.2x0.1] (cm) Area (cm) : [1:3.613] [2:10.367] [6:0.031] Volume (cm) : [1:2.89] [2:2.073] [6:0.003] % Reduction in Area: [1:67.10%] [2:-120.00%] [6:95.10%] % Reduction in Volume: 84.50% [2:-340.10%] [6:95.20%] Classification: [1:Full Thickness Without Exposed Support Structures] [2:Category/Stage III] [6:Category/Stage II] Exudate Amount: [1:Large] [2:Large] [6:Small] Exudate Type: [1:Serous] [2:Serous] [6:Serosanguineous] Exudate Color: [1:amber] [2:amber] [6:red, brown] Foul Odor After Yes No No Cleansing: Odor Anticipated Due to No N/A N/A Product Use: Wound Margin: Flat and Intact Flat and Intact Distinct, outline attached Granulation Amount: Medium (34-66%) Medium (34-66%) Medium (34-66%) Granulation Quality: Pink Pink Pink, Pale Necrotic Amount: Medium (34-66%) Medium (34-66%) Small (1-33%) Necrotic Tissue: Adherent [employer], Adherent [facility] Adherent [employer]  Exposed Structures: Fascia: No Fascia: No Fascia: No Fat: No Fat: No Fat: No Tendon: No Tendon: No Tendon: No Muscle: No Muscle: No Muscle: No Joint: No Joint: No Joint: No Bone: No Bone: No Bone: No Limited to Skin Limited to Skin Limited to Skin Breakdown Breakdown Breakdown Epithelialization: None None Medium (34-66%) Periwound Skin Texture: Edema: Yes Edema: Yes Edema: No Excoriation: No Induration: No Callus: No Crepitus: No Fluctuance: No Friable: No Rash: No Scarring: No Periwound Skin Moist: Yes Maceration: Yes Dry/Scaly: Yes Moisture: Moist: Yes Maceration: No Dry/Scaly: No Periwound Skin Color: Erythema: Yes Atrophie Blanche: No Erythema: Yes Cyanosis: No Ecchymosis: No Erythema: No Hemosiderin Staining: No Mottled: No Pallor: No Rubor: No Erythema Location: Circumferential N/A  Circumferential Erythema Change: N/A N/A Decreased Temperature: No Abnormality No Abnormality No Abnormality Tenderness on Yes Yes No Palpation: Wound Preparation: Ulcer Cleansing: Ulcer Cleansing: Ulcer Cleansing: Rinsed/Irrigated with Rinsed/Irrigated with Rinsed/Irrigated with Saline Saline Saline Topical Anesthetic Topical Anesthetic Topical Anesthetic Applied: Other: lidocaine Applied: Other: lidocaine Applied: Other: lidocaine 4% 4% 4% [name], [name] ([id number]) Treatment Notes Electronic Signature(s) Signed: 01/29/2016 4:29:04 PM By: Curtis Sites Entered By: Curtis Sites on 01/29/2016 11:27:32 [name] ([id number]) -------------------------------------------------------------------------------- Multi-Disciplinary Care Plan Details Patient Name: [name] Date of Service: 01/29/2016 10:45 AM Medical Record Number: [id number] Patient Account Number: [id number] Date of Birth/Sex: Dec 06, 1928 (82 y.o. Female) Treating RN: Curtis Sites Primary Care Physician: SYSTEM, PCP Other Clinician: Referring Physician: Treating Physician/Extender: Rudene Re in Treatment: 8 Active Inactive Abuse / Safety / Falls / Self Care Management Nursing Diagnoses: Potential for falls Goals: Patient will remain injury free Date Initiated: 12/04/2015 Goal Status: Active Interventions: Assess fall risk on admission and as needed Notes: Nutrition Nursing Diagnoses: Potential for alteratiion in Nutrition/Potential for imbalanced nutrition Goals: Patient/caregiver agrees to and verbalizes understanding of need to obtain nutritional consultation Date Initiated: 12/04/2015 Goal Status: Active Interventions: Provide education on nutrition Treatment Activities: Education provided on Nutrition : 01/01/2016 Notes: Orientation to the Wound Care Program Nursing Diagnoses: Knowledge deficit related to the wound healing center program JAZZ, BIDDY ([id number]) Goals: Patient/caregiver  will verbalize understanding of the Wound Healing Center Program Date Initiated: 12/04/2015 Goal Status: Active Interventions: Provide education on orientation to the wound center Notes: Venous Leg Ulcer Nursing Diagnoses: Actual venous Insuffiency (use after diagnosis is confirmed) Goals: Verify adequate tissue perfusion prior to therapeutic compression application Date Initiated: 12/04/2015 Goal Status: Active Interventions: Provide education on venous insufficiency Notes: Wound/Skin Impairment Nursing Diagnoses: Impaired tissue integrity Goals: Ulcer/skin breakdown will heal within 14 weeks Date Initiated: 12/04/2015 Goal Status: Active Interventions: Assess patient/caregiver ability to obtain necessary supplies Notes: Electronic Signature(s) Signed: 01/29/2016 4:29:04 PM By: Curtis Sites Entered By: Curtis Sites on 01/29/2016 11:27:23 [name] ([id number]) -------------------------------------------------------------------------------- Pain Assessment Details Patient Name: [name] Date of Service: 01/29/2016 10:45 AM Medical Record Number: [id number] Patient Account Number: [id number] Date of Birth/Sex: Jan 10, 1929 (82 y.o. Female) Treating RN: Curtis Sites Primary Care Physician: SYSTEM, PCP Other Clinician: Referring Physician: Treating Physician/Extender: Rudene Re in Treatment: 8 Active Problems Location of Pain Severity and Description of Pain Patient Has Paino No Site Locations Pain Management and Medication Current Pain Management: Electronic Signature(s) Signed: 01/29/2016 4:29:04 PM By: Curtis Sites Entered By: Curtis Sites on 01/29/2016 11:11:04 [name] ([id number]) -------------------------------------------------------------------------------- Patient/Caregiver Education Details Patient Name: [name] Date of Service: 01/29/2016 10:45 AM Medical Record Number: [id number] Patient Account Number: [id number] Date of  Birth/Gender: 1928/07/26 (82 y.o. Female) Treating RN: Curtis Sites Primary Care Physician: SYSTEM, PCP  Other Clinician: Referring Physician: Treating Physician/Extender: Rudene Re in Treatment: 8 Education Assessment Education Provided To: Caregiver Education Topics Provided Pressure: Handouts: Preventing Pressure Ulcers Methods: Explain/Verbal Responses: State content correctly Electronic Signature(s) Signed: 01/29/2016 4:29:04 PM By: Curtis Sites Entered By: Curtis Sites on 01/29/2016 11:48:31 [name], [name] ([id number]) -------------------------------------------------------------------------------- Wound Assessment Details Patient Name: [name] Date of Service: 01/29/2016 10:45 AM Medical Record Number: [id number] Patient Account Number: [id number] Date of Birth/Sex: 1928-08-08 (82 y.o. Female) Treating RN: Curtis Sites Primary Care Physician: SYSTEM, PCP Other Clinician: Referring Physician: Treating Physician/Extender: Rudene Re in Treatment: 8 Wound Status Wound Number: 1 Primary Infection - not elsewhere classified Etiology: Wound Location: Right Elbow Wound Open Wounding Event: Gradually Appeared Status: Date Acquired: 11/14/2015 Comorbid Cataracts, Lymphedema, Aspiration, Weeks Of Treatment: 8 History: Chronic Obstructive Pulmonary Disease Clustered Wound: No (COPD), Arrhythmia, Congestive Heart Failure, Hypertension, Peripheral Venous Disease, Osteoarthritis, Dementia Photos Wound Measurements Length: (cm) 2.3 Width: (cm) 2 Depth: (cm) 0.8 Area: (cm) 3.613 Volume: (cm) 2.89 % Reduction in Area: 67.1% % Reduction in Volume: 84.5% Epithelialization: None Wound Description Full Thickness Without Exposed Foul Odor Af Classification: Support Structures Due to Produ Wound Margin: Flat and Intact Exudate Large Amount: Exudate Type: Serous Exudate Color: amber ter Cleansing: Yes ct Use: No Wound Bed Granulation  Amount: Medium (34-66%) Exposed Structure [name], [name] ([id number]) Granulation Quality: Pink Fascia Exposed: No Necrotic Amount: Medium (34-66%) Fat Layer Exposed: No Necrotic Quality: Adherent Slough Tendon Exposed: No Muscle Exposed: No Joint Exposed: No Bone Exposed: No Limited to Skin Breakdown Periwound Skin Texture Texture Color No Abnormalities Noted: No No Abnormalities Noted: No Localized Edema: Yes Erythema: Yes Erythema Location: Circumferential Moisture No Abnormalities Noted: No Temperature / Pain Moist: Yes Temperature: No Abnormality Tenderness on Palpation: Yes Wound Preparation Ulcer Cleansing: Rinsed/Irrigated with Saline Topical Anesthetic Applied: Other: lidocaine 4%, Treatment Notes Wound #1 (Right Elbow) 1. Cleansed with: Clean wound with Normal Saline 2. Anesthetic Topical Lidocaine 4% cream to wound bed prior to debridement 4. Dressing Applied: Santyl Ointment 5. Secondary Dressing Applied Bordered Foam Dressing Dry Gauze Electronic Signature(s) Signed: 01/29/2016 4:29:04 PM By: Curtis Sites Entered By: Curtis Sites on 01/29/2016 13:16:08 [name] ([id number]) -------------------------------------------------------------------------------- Wound Assessment Details Patient Name: [name] Date of Service: 01/29/2016 10:45 AM Medical Record Number: [id number] Patient Account Number: [id number] Date of Birth/Sex: 25-May-1929 (82 y.o. Female) Treating RN: Curtis Sites Primary Care Physician: SYSTEM, PCP Other Clinician: Referring Physician: Treating Physician/Extender: Rudene Re in Treatment: 8 Wound Status Wound Number: 2 Primary Pressure Ulcer Etiology: Wound Location: Left Calcaneus Wound Open Wounding Event: Gradually Appeared Status: Date Acquired: 10/30/2015 Comorbid Cataracts, Lymphedema, Aspiration, Weeks Of Treatment: 7 History: Chronic Obstructive Pulmonary Disease Clustered Wound: No (COPD), Arrhythmia,  Congestive Heart Failure, Hypertension, Peripheral Venous Disease, Osteoarthritis, Dementia Photos Wound Measurements Length: (cm) 2.4 Width: (cm) 5.5 Depth: (cm) 0.2 Area: (cm) 10.367 Volume: (cm) 2.073 % Reduction in Area: -120% % Reduction in Volume: -340.1% Epithelialization: None Tunneling: No Undermining: No Wound Description Classification: Category/Stage III Wound Margin: Flat and Intact Exudate Amount: Large Exudate Type: Serous Exudate Color: amber Foul Odor After Cleansing: No Wound Bed Granulation Amount: Medium (34-66%) Exposed Structure Granulation Quality: Pink Fascia Exposed: No Necrotic Amount: Medium (34-66%) Fat Layer Exposed: No [name], [name] ([id number]) Necrotic Quality: Eschar, Adherent Slough Tendon Exposed: No Muscle Exposed: No Joint Exposed: No Bone Exposed: No Limited to Skin Breakdown Periwound Skin Texture Texture Color No Abnormalities Noted: No No Abnormalities Noted: No Callus: No Atrophie Blanche: No Crepitus: No  Cyanosis: No Excoriation: No Ecchymosis: No Fluctuance: No Erythema: No Friable: No Hemosiderin Staining: No Induration: No Mottled: No Localized Edema: Yes Pallor: No Rash: No Rubor: No Scarring: No Temperature / Pain Moisture Temperature: No Abnormality No Abnormalities Noted: No Tenderness on Palpation: Yes Dry / Scaly: No Maceration: Yes Moist: Yes Wound Preparation Ulcer Cleansing: Rinsed/Irrigated with Saline Topical Anesthetic Applied: Other: lidocaine 4%, Treatment Notes Wound #2 (Left Calcaneus) 1. Cleansed with: Clean wound with Normal Saline 2. Anesthetic Topical Lidocaine 4% cream to wound bed prior to debridement 4. Dressing Applied: Santyl Ointment 5. Secondary Dressing Applied Bordered Foam Dressing Dry Gauze Electronic Signature(s) Signed: 01/29/2016 4:29:04 PM By: Curtis Sites Entered By: Curtis Sites on 01/29/2016 13:16:34 [name]  ([id number]) -------------------------------------------------------------------------------- Wound Assessment Details Patient Name: [name] Date of Service: 01/29/2016 10:45 AM Medical Record Number: [id number] Patient Account Number: [id number] Date of Birth/Sex: Feb 04, 1929 (82 y.o. Female) Treating RN: Curtis Sites Primary Care Physician: SYSTEM, PCP Other Clinician: Referring Physician: Treating Physician/Extender: Rudene Re in Treatment: 8 Wound Status Wound Number: 6 Primary Pressure Ulcer Etiology: Wound Location: Left Metatarsal head first - Medial Wound Open Status: Wounding Event: Pressure Injury Comorbid Cataracts, Lymphedema, Aspiration, Date Acquired: 12/25/2015 History: Chronic Obstructive Pulmonary Disease Weeks Of Treatment: 5 (COPD), Arrhythmia, Congestive Heart Clustered Wound: No Failure, Hypertension, Peripheral Venous Disease, Osteoarthritis, Dementia Photos Wound Measurements Length: (cm) 0.2 Width: (cm) 0.2 Depth: (cm) 0.1 Area: (cm) 0.031 Volume: (cm) 0.003 % Reduction in Area: 95.1% % Reduction in Volume: 95.2% Epithelialization: Medium (34-66%) Tunneling: No Undermining: No Wound Description Classification: Category/Stage II Wound Margin: Distinct, outline attached Exudate Amount: Small Exudate Type: Serosanguineous Exudate Color: red, brown Foul Odor After Cleansing: No Wound Bed Granulation Amount: Medium (34-66%) Exposed Structure Granulation Quality: Pink, Pale Fascia Exposed: No Necrotic Amount: Small (1-33%) Fat Layer Exposed: No [name], [name] ([id number]) Necrotic Quality: Adherent Slough Tendon Exposed: No Muscle Exposed: No Joint Exposed: No Bone Exposed: No Limited to Skin Breakdown Periwound Skin Texture Texture Color No Abnormalities Noted: No No Abnormalities Noted: No Localized Edema: No Erythema: Yes Erythema Location: Circumferential Moisture Erythema Change: Decreased No Abnormalities Noted:  No Dry / Scaly: Yes Temperature / Pain Maceration: No Temperature: No Abnormality Wound Preparation Ulcer Cleansing: Rinsed/Irrigated with Saline Topical Anesthetic Applied: Other: lidocaine 4%, Treatment Notes Wound #6 (Left, Medial Metatarsal head first) 1. Cleansed with: Clean wound with Normal Saline 2. Anesthetic Topical Lidocaine 4% cream to wound bed prior to debridement 5. Secondary Dressing Applied Bordered Foam Dressing Electronic Signature(s) Signed: 01/29/2016 4:29:04 PM By: Curtis Sites Entered By: Curtis Sites on 01/29/2016 13:16:59 [name] ([id number]) -------------------------------------------------------------------------------- Vitals Details Patient Name: [name] Date of Service: 01/29/2016 10:45 AM Medical Record Number: [id number] Patient Account Number: [id number] Date of Birth/Sex: 1928/12/14 (82 y.o. Female) Treating RN: Curtis Sites Primary Care Physician: SYSTEM, PCP Other Clinician: Referring Physician: Treating Physician/Extender: Rudene Re in Treatment: 8 Vital Signs Time Taken: 11:11 Temperature (F): 97.6 Height (in): 64 Pulse (bpm): 68 Weight (lbs): 153 Respiratory Rate (breaths/min): 16 Body Mass Index (BMI): 26.3 Blood Pressure (mmHg): 145/61 Reference Range: 80 - 120 mg / dl Electronic Signature(s) Signed: 01/29/2016 4:29:04 PM By: Curtis Sites Entered By: Curtis Sites on 01/29/2016 11:12:02

## 2016-01-29 NOTE — Progress Notes (Signed)
[name] ([id number]) Visit Report for 01/29/2016 Chief Complaint Document Details Patient Name: [name], [name] 01/29/2016 10:45 Date of Service: AM Medical Record [id number] Number: Patient Account Number: [id number] [birth year] (82 y.o. Treating RN: Curtis Sites Date of Birth/Sex: Female) Other Clinician: Primary Care Physician: SYSTEM, PCP Treating Jalaya Sarver Referring Physician: Physician/Extender: Tania Ade in Treatment: 8 Information Obtained from: Patient Chief Complaint Patient seen for complaints of Non-Healing Wound to the right upper arm laterally near the elbow for about a month. She also has some skin changes on her left heel. Electronic Signature(s) Signed: 01/29/2016 4:24:16 PM By: Evlyn Kanner MD, FACS Entered By: Evlyn Kanner on 01/29/2016 12:02:20 [name] ([id number]) -------------------------------------------------------------------------------- Debridement Details Patient Name: [name] 01/29/2016 10:45 Date of Service: AM Medical Record [id number] Number: Patient Account Number: [id number] Nov 02, 1928 (82 y.o. Treating RN: Curtis Sites Date of Birth/Sex: Female) Other Clinician: Primary Care Physician: SYSTEM, PCP Treating Milton Streicher Referring Physician: Physician/Extender: Tania Ade in Treatment: 8 Debridement Performed for Wound #2 Left Calcaneus Assessment: Performed By: Physician Evlyn Kanner, MD Debridement: Debridement Pre-procedure Yes Verification/Time Out Taken: Start Time: 11:45 Pain Control: Lidocaine 4% Topical Solution Level: Skin/Subcutaneous Tissue Total Area Debrided (L x 2.4 (cm) x 5.5 (cm) = 13.2 (cm) W): Tissue and other Viable, Non-Viable, Eschar, Fibrin/Slough, Skin, Subcutaneous material debrided: Instrument: Blade, Forceps Bleeding: Minimum Hemostasis Achieved: Pressure End Time: 11:48 Procedural Pain: 0 Post Procedural Pain: 0 Response to Treatment: Procedure was tolerated well Post Debridement  Measurements of Total Wound Length: (cm) 2.4 Stage: Category/Stage III Width: (cm) 5.5 Depth: (cm) 0.2 Volume: (cm) 2.073 Post Procedure Diagnosis Same as Pre-procedure Electronic Signature(s) Signed: 01/29/2016 4:24:16 PM By: Evlyn Kanner MD, FACS Signed: 01/29/2016 4:29:04 PM By: Curtis Sites Entered By: Evlyn Kanner on 01/29/2016 12:02:06 [name] ([id number]) -------------------------------------------------------------------------------- HPI Details Patient Name: [name] 01/29/2016 10:45 Date of Service: AM Medical Record [id number] Number: Patient Account Number: [id number] Mar 15, 1929 (82 y.o. Treating RN: Curtis Sites Date of Birth/Sex: Female) Other Clinician: Primary Care Physician: SYSTEM, PCP Treating Buster Schueller Referring Physician: Physician/Extender: Tania Ade in Treatment: 8 History of Present Illness Location: wound on her right arm laterally near the elbow Quality: Patient reports experiencing a dull pain to affected area(s). Severity: Patient states wound are getting worse. Duration: Patient has had the wound for < 4 weeks prior to presenting for treatment Timing: Pain in wound is Intermittent (comes and goes Context: The wound appeared gradually over time Modifying Factors: Other treatment(s) tried include:local care with dressing changes and has recently been on clindamycin Associated Signs and Symptoms: Patient reports having increase swelling all over the body including upper arms and legs. HPI Description: 82 year old patient was been referred to [place] for a ulcerated area on her right elbow. She has a past medical history of CHF, atrial fibrillation, hypertension, gait instability, bilateral lower extremity edema, morbid obesity, altered mental status. She has never been a smoker. her cardiologist is Dr. Julien Nordmann, who last saw her in March 2017.he has been seeing her with a history of hypertension, gait instability, cognitive impairment,  A. fib with RVR and chronic systolic CHF. she is on Eliquis 5 mg twice a day and for the bilateral lower extremity lymphedema he had recommended elevation and compression hose. 12/11/2015 -- x-ray of the right elbow shows no evidence of fracture or bony pathology and the joint spaces are intact. The patient has held her anticoagulation for the last 72 hours. 12/18/2015 -- the patient was seen in the ER for fluid overload and breathlessness and appropriate management was  done including increasing her dosage of Lasix after an appropriate workup. Details of this have been noted from the ER record -- chest x-ray did not reveal any acute abnormality and the labs were reviewed. She was put on Lasix 40 mg twice a day for 3 days and she will be on a steroid taper. She was also advised breathing treatments twice a day and to follow-up with her cardiologist. 12/25/2015 -- she continues to have extensive lower extremity edema and has several new areas on both lower legs plus the left buttock and left elbow. These are all new since last week. 01/15/2016 -- continues to be under the care of her cardiologist regarding her Lasix dosage and continues to have a mild compression on her lower extremities as much as she can tolerate it. Electronic Signature(s) Signed: 01/29/2016 4:24:16 PM By: Evlyn Kanner MD, FACS Entered By: Evlyn Kanner on 01/29/2016 12:02:30 SHIA, DELAINE ([id number]) BELL, CARBO ([id number]) -------------------------------------------------------------------------------- Physical Exam Details Patient Name: [name] 01/29/2016 10:45 Date of Service: AM Medical Record [id number] Number: Patient Account Number: [id number] 1928/10/18 (82 y.o. Treating RN: Curtis Sites Date of Birth/Sex: Female) Other Clinician: Primary Care Physician: SYSTEM, PCP Treating Evlyn Kanner Referring Physician: Physician/Extender: Tania Ade in Treatment: 8 Constitutional . Pulse regular. Respirations  normal and unlabored. Afebrile. . Eyes Nonicteric. Reactive to light. Ears, Nose, Mouth, and Throat Lips, teeth, and gums WNL.Marland Kitchen Moist mucosa without lesions. Neck supple and nontender. No palpable supraclavicular or cervical adenopathy. Normal sized without goiter. Respiratory WNL. No retractions.. Cardiovascular Pedal Pulses WNL. No clubbing, cyanosis or edema. Gastrointestinal (GI) Abdomen without masses or tenderness.. No liver or spleen enlargement or tenderness.. Lymphatic No adneopathy. No adenopathy. No adenopathy. Musculoskeletal Adexa without tenderness or enlargement.. Digits and nails w/o clubbing, cyanosis, infection, petechiae, ischemia, or inflammatory conditions.. Integumentary (Hair, Skin) No suspicious lesions. No crepitus or fluctuance. No peri-wound warmth or erythema. No masses.Marland Kitchen Psychiatric Judgement and insight Intact.. No evidence of depression, anxiety, or agitation.. Notes The right elbow has minimal area of slough at the depth but other than that there is healthy granulation tissue and there is no inflammation. The left he has significant amount of subcutaneous debris and this was sharply removed with a forcep and 50 number blade and bleeding controlled with pressure. Electronic Signature(s) Signed: 01/29/2016 4:24:16 PM By: Evlyn Kanner MD, FACS Entered By: Evlyn Kanner on 01/29/2016 12:03:28 SILENA, WYSS ([id number]) LAURIANNE, FLORESCA ([id number]) -------------------------------------------------------------------------------- Physician Orders Details Patient Name: SAMMANTHA, MEHLHAFF 01/29/2016 10:45 Date of Service: AM Medical Record [id number] Number: Patient Account Number: [id number] 1928-11-05 (82 y.o. Treating RN: Curtis Sites Date of Birth/Sex: Female) Other Clinician: Primary Care Physician: SYSTEM, PCP Treating Jaken Fregia Referring Physician: Physician/Extender: Tania Ade in Treatment: 8 Verbal / Phone Orders: Yes Clinician: Curtis Sites Read  Back and Verified: Yes Diagnosis Coding Wound Cleansing Wound #1 Right Elbow o Clean wound with Normal Saline. Wound #2 Left Calcaneus o Clean wound with Normal Saline. Wound #6 Left,Medial Metatarsal head first o Clean wound with Normal Saline. Anesthetic Wound #1 Right Elbow o Topical Lidocaine 4% cream applied to wound bed prior to debridement - for clinic use Wound #2 Left Calcaneus o Topical Lidocaine 4% cream applied to wound bed prior to debridement - for clinic use Wound #6 Left,Medial Metatarsal head first o Topical Lidocaine 4% cream applied to wound bed prior to debridement - for clinic use Skin Barriers/Peri-Wound Care Wound #1 Right Elbow o Skin Prep Wound #2 Left Calcaneus o Skin Prep Wound #6 Left,Medial Metatarsal head first o Skin  Prep Primary Wound Dressing Wound #1 Right Elbow o Santyl Ointment Wound #2 Left Calcaneus Matsen, Annessa ([id number]) o Santyl Ointment Wound #6 Left,Medial Metatarsal head first o Boardered Foam Dressing Secondary Dressing Wound #1 Right Elbow o Boardered Foam Dressing Wound #2 Left Calcaneus o Boardered Foam Dressing Dressing Change Frequency Wound #1 Right Elbow o Change dressing every day. Wound #2 Left Calcaneus o Change dressing every day. Wound #6 Left,Medial Metatarsal head first o Change dressing every other day. Follow-up Appointments Wound #1 Right Elbow o Return Appointment in 1 week. Wound #2 Left Calcaneus o Return Appointment in 1 week. Wound #6 Left,Medial Metatarsal head first o Return Appointment in 1 week. Edema Control Wound #1 Right Elbow o Elevate legs to the level of the heart and pump ankles as often as possible Wound #2 Left Calcaneus o Elevate legs to the level of the heart and pump ankles as often as possible Wound #6 Left,Medial Metatarsal head first o Elevate legs to the level of the heart and pump ankles as often as possible Off-Loading Wound  #1 Right Elbow o Turn and reposition every 2 hours Wound #2 Left Calcaneus Sweeney, Christianne ([id number]) o Turn and reposition every 2 hours Wound #6 Left,Medial Metatarsal head first o Turn and reposition every 2 hours Additional Orders / Instructions Wound #1 Right Elbow o Increase protein intake. Wound #2 Left Calcaneus o Increase protein intake. Wound #6 Left,Medial Metatarsal head first o Increase protein intake. Home Health Wound #1 Right Elbow o Continue Home Health Visits - [facility] o Home Health Nurse may visit PRN to address patientos wound care needs. o FACE TO FACE ENCOUNTER: MEDICARE and MEDICAID PATIENTS: I certify that this patient is under my care and that I had a face-to-face encounter that meets the physician face-to-face encounter requirements with this patient on this date. The encounter with the patient was in whole or in part for the following MEDICAL CONDITION: (primary reason for Home Healthcare) MEDICAL NECESSITY: I certify, that based on my findings, NURSING services are a medically necessary home health service. HOME BOUND STATUS: I certify that my clinical findings support that this patient is homebound (i.e., Due to illness or injury, pt requires aid of supportive devices such as crutches, cane, wheelchairs, walkers, the use of special transportation or the assistance of another person to leave their place of residence. There is a normal inability to leave the home and doing so requires considerable and taxing effort. Other absences are for medical reasons / religious services and are infrequent or of short duration when for other reasons). o If current dressing causes regression in wound condition, may D/C ordered dressing product/s and apply Normal Saline Moist Dressing daily until next Wound Healing Center / Other MD appointment. Notify Wound Healing Center of regression in wound condition at [phone]. o Please direct any  NON-WOUND related issues/requests for orders to patient's Primary Care Physician Wound #2 Left Calcaneus o Continue Home Health Visits - [facility] o Home Health Nurse may visit PRN to address patientos wound care needs. o FACE TO FACE ENCOUNTER: MEDICARE and MEDICAID PATIENTS: I certify that this patient is under my care and that I had a face-to-face encounter that meets the physician face-to-face encounter requirements with this patient on this date. The encounter with the patient was in whole or in part for the following MEDICAL CONDITION: (primary reason for Home Healthcare) MEDICAL NECESSITY: I certify, that based on my findings, NURSING services are a medically necessary home health service. HOME BOUND STATUS:  I certify that my clinical findings support that this patient is homebound (i.e., Due to illness or injury, pt requires aid of supportive devices such as crutches, cane, wheelchairs, walkers, the use of special transportation or the assistance of another person to leave their place of residence. There is a Auker, Derrian ([id number]) normal inability to leave the home and doing so requires considerable and taxing effort. Other absences are for medical reasons / religious services and are infrequent or of short duration when for other reasons). o If current dressing causes regression in wound condition, may D/C ordered dressing product/s and apply Normal Saline Moist Dressing daily until next Wound Healing Center / Other MD appointment. Notify Wound Healing Center of regression in wound condition at [phone]. o Please direct any NON-WOUND related issues/requests for orders to patient's Primary Care Physician Wound #6 Left,Medial Metatarsal head first o Continue Home Health Visits - [facility] o Home Health Nurse may visit PRN to address patientos wound care needs. o FACE TO FACE ENCOUNTER: MEDICARE and MEDICAID PATIENTS: I certify that this patient is under my  care and that I had a face-to-face encounter that meets the physician face-to-face encounter requirements with this patient on this date. The encounter with the patient was in whole or in part for the following MEDICAL CONDITION: (primary reason for Home Healthcare) MEDICAL NECESSITY: I certify, that based on my findings, NURSING services are a medically necessary home health service. HOME BOUND STATUS: I certify that my clinical findings support that this patient is homebound (i.e., Due to illness or injury, pt requires aid of supportive devices such as crutches, cane, wheelchairs, walkers, the use of special transportation or the assistance of another person to leave their place of residence. There is a normal inability to leave the home and doing so requires considerable and taxing effort. Other absences are for medical reasons / religious services and are infrequent or of short duration when for other reasons). o If current dressing causes regression in wound condition, may D/C ordered dressing product/s and apply Normal Saline Moist Dressing daily until next Wound Healing Center / Other MD appointment. Notify Wound Healing Center of regression in wound condition at [phone]. o Please direct any NON-WOUND related issues/requests for orders to patient's Primary Care Physician Electronic Signature(s) Signed: 01/29/2016 4:24:16 PM By: Evlyn Kanner MD, FACS Signed: 01/29/2016 4:29:04 PM By: Curtis Sites Entered By: Curtis Sites on 01/29/2016 11:47:39 [name] ([id number]) -------------------------------------------------------------------------------- Problem List Details Patient Name: TENA, LINEBAUGH 01/29/2016 10:45 Date of Service: AM Medical Record [id number] Number: Patient Account Number: [id number] 1929-02-12 (82 y.o. Treating RN: Curtis Sites Date of Birth/Sex: Female) Other Clinician: Primary Care Physician: SYSTEM, PCP Treating Evlyn Kanner Referring  Physician: Physician/Extender: Tania Ade in Treatment: 8 Active Problems ICD-10 Encounter Code Description Active Date Diagnosis S41.101A Unspecified open wound of right upper arm, initial 12/04/2015 Yes encounter L89.629 Pressure ulcer of left heel, unspecified stage 12/04/2015 Yes I50.40 Unspecified combined systolic (congestive) and diastolic 12/04/2015 Yes (congestive) heart failure I89.0 Lymphedema, not elsewhere classified 12/04/2015 Yes Inactive Problems Resolved Problems ICD-10 Code Description Active Date Resolved Date L89.322 Pressure ulcer of left buttock, stage 2 12/25/2015 12/25/2015 S41.102A Unspecified open wound of left upper arm, initial 12/25/2015 12/25/2015 encounter Electronic Signature(s) Signed: 01/29/2016 4:24:16 PM By: Evlyn Kanner MD, FACS Entered By: Evlyn Kanner on 01/29/2016 11:53:09 STARLA, DELLER ([id number]) [name] ([id number]) -------------------------------------------------------------------------------- Progress Note Details Patient Name: LASHAUNDA, SCHILD 01/29/2016 10:45 Date of Service: AM Medical Record [id number] Number: Patient Account Number: [id number] 11/20/1928 (82 y.o. Treating  RN: Curtis Sites Date of Birth/Sex: Female) Other Clinician: Primary Care Physician: SYSTEM, PCP Treating Evlyn Kanner Referring Physician: Physician/Extender: Tania Ade in Treatment: 8 Subjective Chief Complaint Information obtained from Patient Patient seen for complaints of Non-Healing Wound to the right upper arm laterally near the elbow for about a month. She also has some skin changes on her left heel. History of Present Illness (HPI) The following HPI elements were documented for the patient's wound: Location: wound on her right arm laterally near the elbow Quality: Patient reports experiencing a dull pain to affected area(s). Severity: Patient states wound are getting worse. Duration: Patient has had the wound for < 4 weeks prior to presenting for  treatment Timing: Pain in wound is Intermittent (comes and goes Context: The wound appeared gradually over time Modifying Factors: Other treatment(s) tried include:local care with dressing changes and has recently been on clindamycin Associated Signs and Symptoms: Patient reports having increase swelling all over the body including upper arms and legs. 82 year old patient was been referred to [place] for a ulcerated area on her right elbow. She has a past medical history of CHF, atrial fibrillation, hypertension, gait instability, bilateral lower extremity edema, morbid obesity, altered mental status. She has never been a smoker. her cardiologist is Dr. Julien Nordmann, who last saw her in March 2017.he has been seeing her with a history of hypertension, gait instability, cognitive impairment, A. fib with RVR and chronic systolic CHF. she is on Eliquis 5 mg twice a day and for the bilateral lower extremity lymphedema he had recommended elevation and compression hose. 12/11/2015 -- x-ray of the right elbow shows no evidence of fracture or bony pathology and the joint spaces are intact. The patient has held her anticoagulation for the last 72 hours. 12/18/2015 -- the patient was seen in the ER for fluid overload and breathlessness and appropriate management was done including increasing her dosage of Lasix after an appropriate workup. Details of this have been noted from the ER record -- chest x-ray did not reveal any acute abnormality and the labs were reviewed. She was put on Lasix 40 mg twice a day for 3 days and she will be on a steroid taper. She was also advised breathing treatments twice a day and to follow-up with her cardiologist. MARQUIS, MCCLAMROCK ([id number]) 12/25/2015 -- she continues to have extensive lower extremity edema and has several new areas on both lower legs plus the left buttock and left elbow. These are all new since last week. 01/15/2016 -- continues to be under the care of her  cardiologist regarding her Lasix dosage and continues to have a mild compression on her lower extremities as much as she can tolerate it. Objective Constitutional Pulse regular. Respirations normal and unlabored. Afebrile. Vitals Time Taken: 11:11 AM, Height: 64 in, Weight: 153 lbs, BMI: 26.3, Temperature: 97.6 F, Pulse: 68 bpm, Respiratory Rate: 16 breaths/min, Blood Pressure: 145/61 mmHg. Eyes Nonicteric. Reactive to light. Ears, Nose, Mouth, and Throat Lips, teeth, and gums WNL.Marland Kitchen Moist mucosa without lesions. Neck supple and nontender. No palpable supraclavicular or cervical adenopathy. Normal sized without goiter. Respiratory WNL. No retractions.. Cardiovascular Pedal Pulses WNL. No clubbing, cyanosis or edema. Gastrointestinal (GI) Abdomen without masses or tenderness.. No liver or spleen enlargement or tenderness.. Lymphatic No adneopathy. No adenopathy. No adenopathy. Musculoskeletal Adexa without tenderness or enlargement.. Digits and nails w/o clubbing, cyanosis, infection, petechiae, ischemia, or inflammatory conditions.Marland Kitchen Psychiatric Judgement and insight Intact.. No evidence of depression, anxiety, or agitation.. General Notes: The right elbow has minimal  area of slough at the depth but other than that there is healthy granulation tissue and there is no inflammation. The left he has significant amount of subcutaneous debris Murph, Aisha ([id number]) and this was sharply removed with a forcep and 50 number blade and bleeding controlled with pressure. Integumentary (Hair, Skin) No suspicious lesions. No crepitus or fluctuance. No peri-wound warmth or erythema. No masses.. Wound #1 status is Open. Original cause of wound was Gradually Appeared. The wound is located on the Right Elbow. The wound measures 2.3cm length x 2cm width x 0.8cm depth; 3.613cm^2 area and 2.89cm^3 volume. The wound is limited to skin breakdown. There is a large amount of serous drainage noted. The  wound margin is flat and intact. There is medium (34-66%) pink granulation within the wound bed. There is a medium (34-66%) amount of necrotic tissue within the wound bed including Adherent Slough. The periwound skin appearance exhibited: Localized Edema, Moist, Erythema. The surrounding wound skin color is noted with erythema which is circumferential. Periwound temperature was noted as No Abnormality. The periwound has tenderness on palpation. Wound #2 status is Open. Original cause of wound was Gradually Appeared. The wound is located on the Left Calcaneus. The wound measures 2.4cm length x 5.5cm width x 0.2cm depth; 10.367cm^2 area and 2.073cm^3 volume. The wound is limited to skin breakdown. There is no tunneling or undermining noted. There is a large amount of serous drainage noted. The wound margin is flat and intact. There is medium (34-66%) pink granulation within the wound bed. There is a medium (34-66%) amount of necrotic tissue within the wound bed including Eschar and Adherent Slough. The periwound skin appearance exhibited: Localized Edema, Maceration, Moist. The periwound skin appearance did not exhibit: Callus, Crepitus, Excoriation, Fluctuance, Friable, Induration, Rash, Scarring, Dry/Scaly, Atrophie Blanche, Cyanosis, Ecchymosis, Hemosiderin Staining, Mottled, Pallor, Rubor, Erythema. Periwound temperature was noted as No Abnormality. The periwound has tenderness on palpation. Wound #6 status is Open. Original cause of wound was Pressure Injury. The wound is located on the Left,Medial Metatarsal head first. The wound measures 0.2cm length x 0.2cm width x 0.1cm depth; 0.031cm^2 area and 0.003cm^3 volume. The wound is limited to skin breakdown. There is no tunneling or undermining noted. There is a small amount of serosanguineous drainage noted. The wound margin is distinct with the outline attached to the wound base. There is medium (34-66%) pink, pale granulation within the wound  bed. There is a small (1-33%) amount of necrotic tissue within the wound bed including Adherent Slough. The periwound skin appearance exhibited: Dry/Scaly, Erythema. The periwound skin appearance did not exhibit: Localized Edema, Maceration. The surrounding wound skin color is noted with erythema which is circumferential. Periwound temperature was noted as No Abnormality. Assessment Active Problems ICD-10 S41.101A - Unspecified open wound of right upper arm, initial encounter L89.629 - Pressure ulcer of left heel, unspecified stage I50.40 - Unspecified combined systolic (congestive) and diastolic (congestive) heart failure I89.0 - Lymphedema, not elsewhere classified Monjaras, [place] ([id number]) Procedures Wound #2 Wound #2 is a Pressure Ulcer located on the Left Calcaneus . There was a Skin/Subcutaneous Tissue Debridement ([id number]) debridement with total area of 13.2 sq cm performed by Evlyn Kanner, MD. with the following instrument(s): Blade and Forceps to remove Viable and Non-Viable tissue/material including Fibrin/Slough, Eschar, Skin, and Subcutaneous after achieving pain control using Lidocaine 4% Topical Solution. A time out was conducted prior to the start of the procedure. A Minimum amount of bleeding was controlled with Pressure. The procedure was tolerated well with  a pain level of 0 throughout and a pain level of 0 following the procedure. Post Debridement Measurements: 2.4cm length x 5.5cm width x 0.2cm depth; 2.073cm^3 volume. Post debridement Stage noted as Category/Stage III. Post procedure Diagnosis Wound #2: Same as Pre-Procedure Plan Wound Cleansing: Wound #1 Right Elbow: Clean wound with Normal Saline. Wound #2 Left Calcaneus: Clean wound with Normal Saline. Wound #6 Left,Medial Metatarsal head first: Clean wound with Normal Saline. Anesthetic: Wound #1 Right Elbow: Topical Lidocaine 4% cream applied to wound bed prior to debridement - for clinic use Wound #2  Left Calcaneus: Topical Lidocaine 4% cream applied to wound bed prior to debridement - for clinic use Wound #6 Left,Medial Metatarsal head first: Topical Lidocaine 4% cream applied to wound bed prior to debridement - for clinic use Skin Barriers/Peri-Wound Care: Wound #1 Right Elbow: Skin Prep Wound #2 Left Calcaneus: Skin Prep Wound #6 Left,Medial Metatarsal head first: Skin Prep Primary Wound Dressing: Wound #1 Right Elbow: Santyl Ointment Wound #2 Left Calcaneus: Santyl Ointment Wound #6 Left,Medial Metatarsal head first: Ewan, Nikcole ([id number]) Boardered Foam Dressing Secondary Dressing: Wound #1 Right Elbow: Boardered Foam Dressing Wound #2 Left Calcaneus: Boardered Foam Dressing Dressing Change Frequency: Wound #1 Right Elbow: Change dressing every day. Wound #2 Left Calcaneus: Change dressing every day. Wound #6 Left,Medial Metatarsal head first: Change dressing every other day. Follow-up Appointments: Wound #1 Right Elbow: Return Appointment in 1 week. Wound #2 Left Calcaneus: Return Appointment in 1 week. Wound #6 Left,Medial Metatarsal head first: Return Appointment in 1 week. Edema Control: Wound #1 Right Elbow: Elevate legs to the level of the heart and pump ankles as often as possible Wound #2 Left Calcaneus: Elevate legs to the level of the heart and pump ankles as often as possible Wound #6 Left,Medial Metatarsal head first: Elevate legs to the level of the heart and pump ankles as often as possible Off-Loading: Wound #1 Right Elbow: Turn and reposition every 2 hours Wound #2 Left Calcaneus: Turn and reposition every 2 hours Wound #6 Left,Medial Metatarsal head first: Turn and reposition every 2 hours Additional Orders / Instructions: Wound #1 Right Elbow: Increase protein intake. Wound #2 Left Calcaneus: Increase protein intake. Wound #6 Left,Medial Metatarsal head first: Increase protein intake. Home Health: Wound #1 Right Elbow: Continue  Home Health Visits - [facility] Nurse may visit PRN to address patient s wound care needs. FACE TO FACE ENCOUNTER: MEDICARE and MEDICAID PATIENTS: I certify that this patient is under my care and that I had a face-to-face encounter that meets the physician face-to-face encounter requirements with this patient on this date. The encounter with the patient was in whole or in part for the following MEDICAL CONDITION: (primary reason for Home Healthcare) MEDICAL NECESSITY: I certify, that based on my findings, NURSING services are a medically necessary home health service. HOME BOUND STATUS: I certify that my clinical findings support that this patient is homebound (i.e., Due to [employer], [place] ([id number]) illness or injury, pt requires aid of supportive devices such as crutches, cane, wheelchairs, walkers, the use of special transportation or the assistance of another person to leave their place of residence. There is a normal inability to leave the home and doing so requires considerable and taxing effort. Other absences are for medical reasons / religious services and are infrequent or of short duration when for other reasons). If current dressing causes regression in wound condition, may D/C ordered dressing product/s and apply Normal Saline Moist Dressing daily until next Wound Healing  Center / Other MD appointment. Notify Wound Healing Center of regression in wound condition at [phone]. Please direct any NON-WOUND related issues/requests for orders to patient's Primary Care Physician Wound #2 Left Calcaneus: Continue Home Health Visits - [facility] Nurse may visit PRN to address patient s wound care needs. FACE TO FACE ENCOUNTER: MEDICARE and MEDICAID PATIENTS: I certify that this patient is under my care and that I had a face-to-face encounter that meets the physician face-to-face encounter requirements with this patient on this date. The encounter with the patient was  in whole or in part for the following MEDICAL CONDITION: (primary reason for Home Healthcare) MEDICAL NECESSITY: I certify, that based on my findings, NURSING services are a medically necessary home health service. HOME BOUND STATUS: I certify that my clinical findings support that this patient is homebound (i.e., Due to illness or injury, pt requires aid of supportive devices such as crutches, cane, wheelchairs, walkers, the use of special transportation or the assistance of another person to leave their place of residence. There is a normal inability to leave the home and doing so requires considerable and taxing effort. Other absences are for medical reasons / religious services and are infrequent or of short duration when for other reasons). If current dressing causes regression in wound condition, may D/C ordered dressing product/s and apply Normal Saline Moist Dressing daily until next Wound Healing Center / Other MD appointment. Notify Wound Healing Center of regression in wound condition at [phone]. Please direct any NON-WOUND related issues/requests for orders to patient's Primary Care Physician Wound #6 Left,Medial Metatarsal head first: Continue Home Health Visits - [facility] Nurse may visit PRN to address patient s wound care needs. FACE TO FACE ENCOUNTER: MEDICARE and MEDICAID PATIENTS: I certify that this patient is under my care and that I had a face-to-face encounter that meets the physician face-to-face encounter requirements with this patient on this date. The encounter with the patient was in whole or in part for the following MEDICAL CONDITION: (primary reason for Home Healthcare) MEDICAL NECESSITY: I certify, that based on my findings, NURSING services are a medically necessary home health service. HOME BOUND STATUS: I certify that my clinical findings support that this patient is homebound (i.e., Due to illness or injury, pt requires aid of supportive  devices such as crutches, cane, wheelchairs, walkers, the use of special transportation or the assistance of another person to leave their place of residence. There is a normal inability to leave the home and doing so requires considerable and taxing effort. Other absences are for medical reasons / religious services and are infrequent or of short duration when for other reasons). If current dressing causes regression in wound condition, may D/C ordered dressing product/s and apply Normal Saline Moist Dressing daily until next Wound Healing Center / Other MD appointment. Notify Wound Healing Center of regression in wound condition at [phone]. Please direct any NON-WOUND related issues/requests for orders to patient's Primary Care Physician I have recommended: Abram, Eriyanna ([id number]) 1. Santyl ointment locally to the right elbow area, and the left heel wound, which should be applied daily. 2. We have discussed offloading techniques in detail for the lower extremities and at the present time will apply some Santyl ointment to the left heels. 3. Nutrition, protein supplements and vitamin supplements have also been discussed and the daughter says the nursing staff of being very compliant. 4. Patient's daughter Kathie Rhodes had questions regarding whether the patient should be moved to a skilled  nursing facility and I have no objections against this Electronic Signature(s) Signed: 01/29/2016 4:27:17 PM By: Evlyn Kanner MD, FACS Previous Signature: 01/29/2016 4:24:16 PM Version By: Evlyn Kanner MD, FACS Entered By: Evlyn Kanner on 01/29/2016 16:27:17 [name] ([id number]) -------------------------------------------------------------------------------- SuperBill Details Patient Name: [name] Date of Service: 01/29/2016 Medical Record Number: [id number] Patient Account Number: [id number] Date of Birth/Sex: May 19, 1929 (82 y.o. 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 I89.0 Lymphedema, not elsewhere classified Facility Procedures CPT4: Description Modifier Quantity Code 78295621 11042 - DEB SUBQ TISSUE 20 SQ CM/< 1 ICD-10 Description Diagnosis S41.101A Unspecified open wound of right upper arm, initial encounter L89.629 Pressure ulcer of left heel, unspecified stage I50.40  Unspecified combined systolic (congestive) and diastolic (congestive) heart failure I89.0 Lymphedema, not elsewhere classified Physician Procedures CPT4: Description Modifier Quantity Code [id number] 11042 - WC PHYS SUBQ TISS 20 SQ CM 1 ICD-10 Description Diagnosis S41.101A Unspecified open wound of right upper arm, initial encounter L89.629 Pressure ulcer of left heel, unspecified stage I50.40  Unspecified combined systolic (congestive) and diastolic (congestive) heart failure I89.0 Lymphedema, not elsewhere classified Electronic Signature(s) Signed: 01/29/2016 4:24:16 PM By: Evlyn Kanner MD, FACS [place], Juanetta ([id number]) Entered By: Evlyn Kanner on 01/29/2016 12:05:13

## 2016-01-30 ENCOUNTER — Ambulatory Visit: Payer: Medicare Other | Admitting: Cardiovascular Disease

## 2016-02-05 ENCOUNTER — Encounter: Payer: Medicare Other | Attending: Surgery | Admitting: Surgery

## 2016-02-05 DIAGNOSIS — J449 Chronic obstructive pulmonary disease, unspecified: Secondary | ICD-10-CM | POA: Insufficient documentation

## 2016-02-05 DIAGNOSIS — L97521 Non-pressure chronic ulcer of other part of left foot limited to breakdown of skin: Secondary | ICD-10-CM | POA: Diagnosis not present

## 2016-02-05 DIAGNOSIS — Z7901 Long term (current) use of anticoagulants: Secondary | ICD-10-CM | POA: Insufficient documentation

## 2016-02-05 DIAGNOSIS — I110 Hypertensive heart disease with heart failure: Secondary | ICD-10-CM | POA: Diagnosis not present

## 2016-02-05 DIAGNOSIS — F0390 Unspecified dementia without behavioral disturbance: Secondary | ICD-10-CM | POA: Insufficient documentation

## 2016-02-05 DIAGNOSIS — I4891 Unspecified atrial fibrillation: Secondary | ICD-10-CM | POA: Diagnosis not present

## 2016-02-05 DIAGNOSIS — X58XXXA Exposure to other specified factors, initial encounter: Secondary | ICD-10-CM | POA: Insufficient documentation

## 2016-02-05 DIAGNOSIS — L89629 Pressure ulcer of left heel, unspecified stage: Secondary | ICD-10-CM | POA: Diagnosis not present

## 2016-02-05 DIAGNOSIS — I5040 Unspecified combined systolic (congestive) and diastolic (congestive) heart failure: Secondary | ICD-10-CM | POA: Diagnosis not present

## 2016-02-05 DIAGNOSIS — I890 Lymphedema, not elsewhere classified: Secondary | ICD-10-CM | POA: Insufficient documentation

## 2016-02-05 DIAGNOSIS — S41101A Unspecified open wound of right upper arm, initial encounter: Secondary | ICD-10-CM | POA: Diagnosis present

## 2016-02-05 NOTE — Progress Notes (Signed)
Anita, Kirk ([id number]) Visit Report for 02/05/2016 Chief Complaint Document Details Patient Name: [name], [name] Date of Service: 02/05/2016 10:45 AM Medical Record Number: [id number] Patient Account Number: [id number] Date of Birth/Sex: [birth year] (82 y.o. Female) Treating RN: Ashok Cordia, Debi Primary Care Physician: SYSTEM, PCP Other Clinician: Referring Physician: Treating Physician/Extender: Rudene Re in Treatment: 9 Information Obtained from: Patient Chief Complaint Patient seen for complaints of Non-Healing Wound to the right upper arm laterally near the elbow for about a month. She also has some skin changes on her left heel. Electronic Signature(s) Signed: 02/05/2016 11:27:20 AM By: Evlyn Kanner MD, FACS Entered By: Evlyn Kanner on 02/05/2016 11:27:19 [name] ([id number]) -------------------------------------------------------------------------------- Debridement Details Patient Name: [name] Date of Service: 02/05/2016 10:45 AM Medical Record Number: [id number] Patient Account Number: [id number] Date of Birth/Sex: 1929-06-14 (82 y.o. Female) Treating RN: Ashok Cordia, Debi Primary Care Physician: SYSTEM, PCP Other Clinician: Referring Physician: Treating Physician/Extender: Rudene Re in Treatment: 9 Debridement Performed for Wound #2 Left Calcaneus Assessment: Performed By: Physician Evlyn Kanner, MD Debridement: Debridement Pre-procedure Yes Verification/Time Out Taken: Start Time: 11:20 Pain Control: Other : lidocaine 4% cream Level: Skin/Subcutaneous Tissue Total Area Debrided (L x 2 (cm) x 3 (cm) = 6 (cm) W): Tissue and other Viable, Non-Viable, Exudate, Fibrin/Slough, Subcutaneous material debrided: Instrument: Forceps, Scissors Bleeding: Minimum Hemostasis Achieved: Pressure End Time: 11:22 Procedural Pain: 0 Post Procedural Pain: 0 Response to Treatment: Procedure was tolerated well Post Debridement Measurements of Total  Wound Length: (cm) 2.2 Stage: Category/Stage III Width: (cm) 2.1 Depth: (cm) 0.2 Volume: (cm) 0.726 Post Procedure Diagnosis Same as Pre-procedure Electronic Signature(s) Signed: 02/05/2016 11:26:51 AM By: Evlyn Kanner MD, FACS Signed: 02/05/2016 4:06:37 PM By: Alejandro Mulling Entered By: Evlyn Kanner on 02/05/2016 11:26:51 [name] ([id number]) -------------------------------------------------------------------------------- HPI Details Patient Name: [name] Date of Service: 02/05/2016 10:45 AM Medical Record Number: [id number] Patient Account Number: [id number] Date of Birth/Sex: 05-Nov-1928 (82 y.o. Female) Treating RN: Ashok Cordia, Debi Primary Care Physician: SYSTEM, PCP Other Clinician: Referring Physician: Treating Physician/Extender: Rudene Re in Treatment: 9 History of Present Illness Location: wound on her right arm laterally near the elbow Quality: Patient reports experiencing a dull pain to affected area(s). Severity: Patient states wound are getting worse. Duration: Patient has had the wound for < 4 weeks prior to presenting for treatment Timing: Pain in wound is Intermittent (comes and goes Context: The wound appeared gradually over time Modifying Factors: Other treatment(s) tried include:local care with dressing changes and has recently been on clindamycin Associated Signs and Symptoms: Patient reports having increase swelling all over the body including upper arms and legs. HPI Description: 82 year old patient was been referred to [place] for a ulcerated area on her right elbow. She has a past medical history of CHF, atrial fibrillation, hypertension, gait instability, bilateral lower extremity edema, morbid obesity, altered mental status. She has never been a smoker. her cardiologist is Dr. Julien Nordmann, who last saw her in March 2017.he has been seeing her with a history of hypertension, gait instability, cognitive impairment, A. fib with RVR and  chronic systolic CHF. she is on Eliquis 5 mg twice a day and for the bilateral lower extremity lymphedema he had recommended elevation and compression hose. 12/11/2015 -- x-ray of the right elbow shows no evidence of fracture or bony pathology and the joint spaces are intact. The patient has held her anticoagulation for the last 72 hours. 12/18/2015 -- the patient was seen in the ER for fluid overload and breathlessness and appropriate management was  done including increasing her dosage of Lasix after an appropriate workup. Details of this have been noted from the ER record -- chest x-ray did not reveal any acute abnormality and the labs were reviewed. She was put on Lasix 40 mg twice a day for 3 days and she will be on a steroid taper. She was also advised breathing treatments twice a day and to follow-up with her cardiologist. 12/25/2015 -- she continues to have extensive lower extremity edema and has several new areas on both lower legs plus the left buttock and left elbow. These are all new since last week. 01/15/2016 -- continues to be under the care of her cardiologist regarding her Lasix dosage and continues to have a mild compression on her lower extremities as much as she can tolerate it. Electronic Signature(s) Signed: 02/05/2016 11:27:32 AM By: Evlyn KannerBritto, Raniya Golembeski MD, FACS Entered By: Evlyn KannerBritto, Hadiyah Maricle on 02/05/2016 11:27:31 Anita BubaJOBE, Aniko ([id number]) -------------------------------------------------------------------------------- Physical Exam Details Patient Name: Anita BubaJOBE, Anita Date of Service: 02/05/2016 10:45 AM Medical Record Number: [id number] Patient Account Number: [id number] Date of Birth/Sex: Apr 01, 1929 (82 y.o. Female) Treating RN: Ashok CordiaPinkerton, Debi Primary Care Physician: SYSTEM, PCP Other Clinician: Referring Physician: Treating Physician/Extender: Rudene ReBritto, Gaurav Baldree Weeks in Treatment: 9 Constitutional . Pulse regular. Respirations normal and unlabored. Afebrile. . Eyes Nonicteric.  Reactive to light. Ears, Nose, Mouth, and Throat Lips, teeth, and gums WNL.Marland Kitchen. Moist mucosa without lesions. Neck supple and nontender. No palpable supraclavicular or cervical adenopathy. Normal sized without goiter. Respiratory WNL. No retractions.. Breath sounds WNL, No rubs, rales, rhonchi, or wheeze.. Cardiovascular Heart rhythm and rate regular, no murmur or gallop.. Pedal Pulses WNL. No clubbing, cyanosis or edema. Chest Breasts symmetical and no nipple discharge.. Breast tissue WNL, no masses, lumps, or tenderness.. Lymphatic No adneopathy. No adenopathy. No adenopathy. Musculoskeletal Adexa without tenderness or enlargement.. Digits and nails w/o clubbing, cyanosis, infection, petechiae, ischemia, or inflammatory conditions.. Integumentary (Hair, Skin) No suspicious lesions. No crepitus or fluctuance. No peri-wound warmth or erythema. No masses.Marland Kitchen. Psychiatric Judgement and insight Intact.. No evidence of depression, anxiety, or agitation.. Notes she has developed some new wounds on the left hallux which are superficial in nature. The right elbow has healthy granulation tissue and there is no inflammation. The left heel has significant amount of subcutaneous debris and this was sharply removed with a forcep and scissors and bleeding controlled with pressure. Electronic Signature(s) Signed: 02/05/2016 11:28:56 AM By: Evlyn KannerBritto, Jayden Kratochvil MD, FACS Entered By: Evlyn KannerBritto, Aarna Mihalko on 02/05/2016 11:28:55 Anita BubaJOBE, Geryl ([id number]) -------------------------------------------------------------------------------- Physician Orders Details Patient Name: Anita BubaJOBE, Arianni Date of Service: 02/05/2016 10:45 AM Medical Record Number: [id number] Patient Account Number: [id number] Date of Birth/Sex: Apr 01, 1929 (82 y.o. Female) Treating RN: Ashok CordiaPinkerton, Debi Primary Care Physician: SYSTEM, PCP Other Clinician: Referring Physician: Treating Physician/Extender: Rudene ReBritto, Jesse Nosbisch Weeks in Treatment: 9 Verbal / Phone  Orders: Yes Clinician: Ashok CordiaPinkerton, Debi Read Back and Verified: Yes Diagnosis Coding ICD-10 Coding Code Description S41.101A Unspecified open wound of right upper arm, initial encounter L89.629 Pressure ulcer of left heel, unspecified stage I50.40 Unspecified combined systolic (congestive) and diastolic (congestive) heart failure I89.0 Lymphedema, not elsewhere classified L97.521 Non-pressure chronic ulcer of other part of left foot limited to breakdown of skin Wound Cleansing Wound #1 Right Elbow o Clean wound with Normal Saline. o Cleanse wound with mild soap and water Wound #11 Left,Dorsal Toe Great o Clean wound with Normal Saline. o Cleanse wound with mild soap and water Wound #2 Left Calcaneus o Clean wound with Normal Saline. o Cleanse wound with mild soap and water  Wound #6 Left,Medial Metatarsal head first o Clean wound with Normal Saline. o Cleanse wound with mild soap and water Anesthetic Wound #1 Right Elbow o Topical Lidocaine 4% cream applied to wound bed prior to debridement - for clinic use Wound #11 Left,Dorsal Toe Great o Topical Lidocaine 4% cream applied to wound bed prior to debridement - for clinic use Wound #2 Left Calcaneus o Topical Lidocaine 4% cream applied to wound bed prior to debridement - for clinic use Wound #6 Left,Medial Metatarsal head first Lacson, Adah ([id number]) o Topical Lidocaine 4% cream applied to wound bed prior to debridement - for clinic use Skin Barriers/Peri-Wound Care Wound #1 Right Elbow o Skin Prep Primary Wound Dressing Wound #1 Right Elbow o Prisma Ag - moisten with normal saline Wound #11 Left,Dorsal Toe Great o Aquacel Ag Wound #2 Left Calcaneus o Santyl Ointment Wound #6 Left,Medial Metatarsal head first o Aquacel Ag Secondary Dressing Wound #1 Right Elbow o Boardered Foam Dressing Wound #11 Left,Dorsal Toe Great o ABD pad o Dry Gauze o Conform/Kerlix - tape and  netting Wound #2 Left Calcaneus o ABD pad o Dry Gauze o Conform/Kerlix - tape and netting Wound #6 Left,Medial Metatarsal head first o ABD pad o Dry Gauze o Conform/Kerlix - tape and netting Dressing Change Frequency Wound #1 Right Elbow o Change dressing every other day. Wound #11 Left,Dorsal Toe Great o Change dressing every other day. Wound #2 Left Calcaneus o Change dressing every day. Guardiola, Znya ([id number]) Wound #6 Left,Medial Metatarsal head first o Change dressing every other day. Follow-up Appointments Wound #1 Right Elbow o Return Appointment in 1 week. Wound #11 Left,Dorsal Toe Great o Return Appointment in 1 week. Wound #2 Left Calcaneus o Return Appointment in 1 week. Wound #6 Left,Medial Metatarsal head first o Return Appointment in 1 week. Edema Control Wound #1 Right Elbow o Elevate legs to the level of the heart and pump ankles as often as possible Wound #11 Left,Dorsal Toe Great o Elevate legs to the level of the heart and pump ankles as often as possible Wound #2 Left Calcaneus o Elevate legs to the level of the heart and pump ankles as often as possible Wound #6 Left,Medial Metatarsal head first o Elevate legs to the level of the heart and pump ankles as often as possible Off-Loading Wound #1 Right Elbow o Turn and reposition every 2 hours Wound #11 Left,Dorsal Toe Great o Turn and reposition every 2 hours Wound #2 Left Calcaneus o Turn and reposition every 2 hours Wound #6 Left,Medial Metatarsal head first o Turn and reposition every 2 hours Additional Orders / Instructions Wound #1 Right Elbow o Increase protein intake. Wound #11 Left,Dorsal Toe [place], Michaeleen ([id number]) o Increase protein intake. Wound #2 Left Calcaneus o Increase protein intake. Wound #6 Left,Medial Metatarsal head first o Increase protein intake. Home Health Wound #1 Right Elbow o Continue Home Health Visits  - [facility] o Home Health Nurse may visit PRN to address patientos wound care needs. o FACE TO FACE ENCOUNTER: MEDICARE and MEDICAID PATIENTS: I certify that this patient is under my care and that I had a face-to-face encounter that meets the physician face-to-face encounter requirements with this patient on this date. The encounter with the patient was in whole or in part for the following MEDICAL CONDITION: (primary reason for Home Healthcare) MEDICAL NECESSITY: I certify, that based on my findings, NURSING services are a medically necessary home health service. HOME BOUND STATUS: I certify that my clinical findings support  that this patient is homebound (i.e., Due to illness or injury, pt requires aid of supportive devices such as crutches, cane, wheelchairs, walkers, the use of special transportation or the assistance of another person to leave their place of residence. There is a normal inability to leave the home and doing so requires considerable and taxing effort. Other absences are for medical reasons / religious services and are infrequent or of short duration when for other reasons). o If current dressing causes regression in wound condition, may D/C ordered dressing product/s and apply Normal Saline Moist Dressing daily until next Wound Healing Center / Other MD appointment. Notify Wound Healing Center of regression in wound condition at [phone]. o Please direct any NON-WOUND related issues/requests for orders to patient's Primary Care Physician Wound #11 Left,Dorsal Toe Great o Continue Home Health Visits - [facility] o Home Health Nurse may visit PRN to address patientos wound care needs. o FACE TO FACE ENCOUNTER: MEDICARE and MEDICAID PATIENTS: I certify that this patient is under my care and that I had a face-to-face encounter that meets the physician face-to-face encounter requirements with this patient on this date. The encounter with the patient was  in whole or in part for the following MEDICAL CONDITION: (primary reason for Home Healthcare) MEDICAL NECESSITY: I certify, that based on my findings, NURSING services are a medically necessary home health service. HOME BOUND STATUS: I certify that my clinical findings support that this patient is homebound (i.e., Due to illness or injury, pt requires aid of supportive devices such as crutches, cane, wheelchairs, walkers, the use of special transportation or the assistance of another person to leave their place of residence. There is a normal inability to leave the home and doing so requires considerable and taxing effort. Other absences are for medical reasons / religious services and are infrequent or of short duration when for other reasons). o If current dressing causes regression in wound condition, may D/C ordered dressing product/s and apply Normal Saline Moist Dressing daily until next Wound Healing Center / Other MD appointment. Notify Wound Healing Center of regression in wound condition at [phone]. o Please direct any NON-WOUND related issues/requests for orders to patient's Primary Care Physician LEATHA, ROHNER ([id number]) Wound #2 Left Calcaneus o Continue Home Health Visits - [facility] o Home Health Nurse may visit PRN to address patientos wound care needs. o FACE TO FACE ENCOUNTER: MEDICARE and MEDICAID PATIENTS: I certify that this patient is under my care and that I had a face-to-face encounter that meets the physician face-to-face encounter requirements with this patient on this date. The encounter with the patient was in whole or in part for the following MEDICAL CONDITION: (primary reason for Home Healthcare) MEDICAL NECESSITY: I certify, that based on my findings, NURSING services are a medically necessary home health service. HOME BOUND STATUS: I certify that my clinical findings support that this patient is homebound (i.e., Due to illness or injury, pt  requires aid of supportive devices such as crutches, cane, wheelchairs, walkers, the use of special transportation or the assistance of another person to leave their place of residence. There is a normal inability to leave the home and doing so requires considerable and taxing effort. Other absences are for medical reasons / religious services and are infrequent or of short duration when for other reasons). o If current dressing causes regression in wound condition, may D/C ordered dressing product/s and apply Normal Saline Moist Dressing daily until next Wound Healing Center / Other MD appointment. Notify  Wound Healing Center of regression in wound condition at [phone]. o Please direct any NON-WOUND related issues/requests for orders to patient's Primary Care Physician Wound #6 Left,Medial Metatarsal head first o Continue Home Health Visits - [facility] o Home Health Nurse may visit PRN to address patientos wound care needs. o FACE TO FACE ENCOUNTER: MEDICARE and MEDICAID PATIENTS: I certify that this patient is under my care and that I had a face-to-face encounter that meets the physician face-to-face encounter requirements with this patient on this date. The encounter with the patient was in whole or in part for the following MEDICAL CONDITION: (primary reason for Home Healthcare) MEDICAL NECESSITY: I certify, that based on my findings, NURSING services are a medically necessary home health service. HOME BOUND STATUS: I certify that my clinical findings support that this patient is homebound (i.e., Due to illness or injury, pt requires aid of supportive devices such as crutches, cane, wheelchairs, walkers, the use of special transportation or the assistance of another person to leave their place of residence. There is a normal inability to leave the home and doing so requires considerable and taxing effort. Other absences are for medical reasons / religious services and are  infrequent or of short duration when for other reasons). o If current dressing causes regression in wound condition, may D/C ordered dressing product/s and apply Normal Saline Moist Dressing daily until next Wound Healing Center / Other MD appointment. Notify Wound Healing Center of regression in wound condition at [phone]. o Please direct any NON-WOUND related issues/requests for orders to patient's Primary Care Physician Electronic Signature(s) Signed: 02/05/2016 3:30:17 PM By: Evlyn Kanner MD, FACS Signed: 02/05/2016 4:06:37 PM By: Alejandro Mulling Entered By: Alejandro Mulling on 02/05/2016 11:33:43 LILLIEMAE, FRUGE ([id number]) MARQUIA, COSTELLO ([id number]) -------------------------------------------------------------------------------- Problem List Details Patient Name: [name] Date of Service: 02/05/2016 10:45 AM Medical Record Number: [id number] Patient Account Number: [id number] Date of Birth/Sex: [birth year] (82 y.o. Female) Treating RN: Ashok Cordia, Debi Primary Care Physician: SYSTEM, PCP Other Clinician: Referring Physician: Treating Physician/Extender: Rudene Re in Treatment: 9 Active Problems ICD-10 Encounter Code Description Active Date Diagnosis S41.101A Unspecified open wound of right upper arm, initial 12/04/2015 Yes encounter L89.629 Pressure ulcer of left heel, unspecified stage 12/04/2015 Yes I50.40 Unspecified combined systolic (congestive) and diastolic 12/04/2015 Yes (congestive) heart failure I89.0 Lymphedema, not elsewhere classified 12/04/2015 Yes L97.521 Non-pressure chronic ulcer of other part of left foot limited 02/05/2016 Yes to breakdown of skin Inactive Problems Resolved Problems ICD-10 Code Description Active Date Resolved Date L89.322 Pressure ulcer of left buttock, stage 2 12/25/2015 12/25/2015 S41.102A Unspecified open wound of left upper arm, initial 12/25/2015 12/25/2015 encounter Electronic Signature(s) MOHINI, HEATHCOCK ([id number]) Signed:  02/05/2016 11:26:23 AM By: Evlyn Kanner MD, FACS Entered By: Evlyn Kanner on 02/05/2016 11:26:23 [name] ([id number]) -------------------------------------------------------------------------------- Progress Note Details Patient Name: [name] Date of Service: 02/05/2016 10:45 AM Medical Record Number: [id number] Patient Account Number: [id number] Date of Birth/Sex: 11-02-1928 (82 y.o. Female) Treating RN: Ashok Cordia, Debi Primary Care Physician: SYSTEM, PCP Other Clinician: Referring Physician: Treating Physician/Extender: Rudene Re in Treatment: 9 Subjective Chief Complaint Information obtained from Patient Patient seen for complaints of Non-Healing Wound to the right upper arm laterally near the elbow for about a month. She also has some skin changes on her left heel. History of Present Illness (HPI) The following HPI elements were documented for the patient's wound: Location: wound on her right arm laterally near the elbow Quality: Patient reports experiencing a dull pain to affected area(s). Severity: Patient  states wound are getting worse. Duration: Patient has had the wound for < 4 weeks prior to presenting for treatment Timing: Pain in wound is Intermittent (comes and goes Context: The wound appeared gradually over time Modifying Factors: Other treatment(s) tried include:local care with dressing changes and has recently been on clindamycin Associated Signs and Symptoms: Patient reports having increase swelling all over the body including upper arms and legs. 82 year old patient was been referred to [place] for a ulcerated area on her right elbow. She has a past medical history of CHF, atrial fibrillation, hypertension, gait instability, bilateral lower extremity edema, morbid obesity, altered mental status. She has never been a smoker. her cardiologist is Dr. Julien Nordmann, who last saw her in March 2017.he has been seeing her with a history of hypertension, gait  instability, cognitive impairment, A. fib with RVR and chronic systolic CHF. she is on Eliquis 5 mg twice a day and for the bilateral lower extremity lymphedema he had recommended elevation and compression hose. 12/11/2015 -- x-ray of the right elbow shows no evidence of fracture or bony pathology and the joint spaces are intact. The patient has held her anticoagulation for the last 72 hours. 12/18/2015 -- the patient was seen in the ER for fluid overload and breathlessness and appropriate management was done including increasing her dosage of Lasix after an appropriate workup. Details of this have been noted from the ER record -- chest x-ray did not reveal any acute abnormality and the labs were reviewed. She was put on Lasix 40 mg twice a day for 3 days and she will be on a steroid taper. She was also advised breathing treatments twice a day and to follow-up with her cardiologist. 12/25/2015 -- she continues to have extensive lower extremity edema and has several new areas on both lower legs plus the left buttock and left elbow. These are all new since last week. KAEDYN, BELARDO ([id number]) 01/15/2016 -- continues to be under the care of her cardiologist regarding her Lasix dosage and continues to have a mild compression on her lower extremities as much as she can tolerate it. Objective Constitutional Pulse regular. Respirations normal and unlabored. Afebrile. Vitals Time Taken: 10:56 AM, Height: 64 in, Weight: 153 lbs, BMI: 26.3, Pulse: 73 bpm, Respiratory Rate: 16 breaths/min, Blood Pressure: 124/71 mmHg. Eyes Nonicteric. Reactive to light. Ears, Nose, Mouth, and Throat Lips, teeth, and gums WNL.Marland Kitchen Moist mucosa without lesions. Neck supple and nontender. No palpable supraclavicular or cervical adenopathy. Normal sized without goiter. Respiratory WNL. No retractions.. Breath sounds WNL, No rubs, rales, rhonchi, or wheeze.. Cardiovascular Heart rhythm and rate regular, no murmur or  gallop.. Pedal Pulses WNL. No clubbing, cyanosis or edema. Chest Breasts symmetical and no nipple discharge.. Breast tissue WNL, no masses, lumps, or tenderness.. Lymphatic No adneopathy. No adenopathy. No adenopathy. Musculoskeletal Adexa without tenderness or enlargement.. Digits and nails w/o clubbing, cyanosis, infection, petechiae, ischemia, or inflammatory conditions.Marland Kitchen Psychiatric Judgement and insight Intact.. No evidence of depression, anxiety, or agitation.. General Notes: she has developed some new wounds on the left hallux which are superficial in nature. The right elbow has healthy granulation tissue and there is no inflammation. The left heel has significant amount of subcutaneous debris and this was sharply removed with a forcep and scissors and bleeding controlled with pressure. Molony, Virdia ([id number]) Integumentary (Hair, Skin) No suspicious lesions. No crepitus or fluctuance. No peri-wound warmth or erythema. No masses.. Wound #1 status is Open. Original cause of wound was Gradually Appeared. The wound is located  on the Right Elbow. The wound measures 2.4cm length x 1.6cm width x 1.1cm depth; 3.016cm^2 area and 3.318cm^3 volume. The wound is limited to skin breakdown. There is no tunneling or undermining noted. There is a large amount of serous drainage noted. The wound margin is flat and intact. There is medium (34-66%) pink granulation within the wound bed. There is a medium (34-66%) amount of necrotic tissue within the wound bed including Adherent Slough. The periwound skin appearance exhibited: Localized Edema, Moist, Erythema. The surrounding wound skin color is noted with erythema which is circumferential. Periwound temperature was noted as No Abnormality. The periwound has tenderness on palpation. Wound #11 status is Open. Original cause of wound was Blister. The wound is located on the [employer]. The wound measures 1cm length x 1cm width x 0.1cm depth;  0.785cm^2 area and 0.079cm^3 volume. The wound is limited to skin breakdown. There is no tunneling or undermining noted. There is a small amount of serous drainage noted. The wound margin is flat and intact. There is large (67-100%) pink granulation within the wound bed. There is no necrotic tissue within the wound bed. The periwound skin appearance exhibited: Localized Edema, Erythema. The surrounding wound skin color is noted with erythema which is circumferential. Periwound temperature was noted as No Abnormality. The periwound has tenderness on palpation. Wound #2 status is Open. Original cause of wound was Gradually Appeared. The wound is located on the Left Calcaneus. The wound measures 2.2cm length x 5.1cm width x 0.2cm depth; 8.812cm^2 area and 1.762cm^3 volume. The wound is limited to skin breakdown. There is no tunneling or undermining noted. There is a large amount of serous drainage noted. The wound margin is flat and intact. There is medium (34-66%) pink granulation within the wound bed. There is a medium (34-66%) amount of necrotic tissue within the wound bed including Eschar and Adherent Slough. The periwound skin appearance exhibited: Localized Edema, Maceration, Moist. The periwound skin appearance did not exhibit: Callus, Crepitus, Excoriation, Fluctuance, Friable, Induration, Rash, Scarring, Dry/Scaly, Atrophie Blanche, Cyanosis, Ecchymosis, Hemosiderin Staining, Mottled, Pallor, Rubor, Erythema. Periwound temperature was noted as No Abnormality. The periwound has tenderness on palpation. Wound #6 status is Open. Original cause of wound was Pressure Injury. The wound is located on the Left,Medial Metatarsal head first. The wound measures 0.6cm length x 0.7cm width x 0.1cm depth; 0.33cm^2 area and 0.033cm^3 volume. The wound is limited to skin breakdown. There is no tunneling or undermining noted. There is a medium amount of serosanguineous drainage noted. The wound margin  is distinct with the outline attached to the wound base. There is small (1-33%) pink, pale granulation within the wound bed. There is a large (67-100%) amount of necrotic tissue within the wound bed including Adherent Slough. The periwound skin appearance exhibited: Dry/Scaly, Erythema. The periwound skin appearance did not exhibit: Localized Edema, Maceration. The surrounding wound skin color is noted with erythema which is circumferential. Periwound temperature was noted as No Abnormality. The periwound has tenderness on palpation. 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 There was a Skin/Subcutaneous Tissue Debridement (40981-19147) debridement with total  area of 6 sq cm performed by Evlyn Kanner, MD. with the following instrument(s): Forceps and Scissors to remove Viable and Non-Viable tissue/material including Exudate, Fibrin/Slough, and Subcutaneous after achieving pain control using Other (lidocaine 4% cream). A time out was conducted prior to the start of the procedure. A Minimum amount of bleeding was controlled with Pressure. The procedure was tolerated well with a pain level of 0 throughout and a pain level of 0 following the procedure. Post Debridement Measurements: 2.2cm length x 2.1cm width x 0.2cm depth; 0.726cm^3 volume. Post debridement Stage noted as Category/Stage III. Post procedure Diagnosis Wound #2: Same as Pre-Procedure Plan Wound Cleansing: Wound #1 Right Elbow: Clean wound with Normal Saline. Cleanse wound  with mild soap and water Wound #11 Left,Dorsal Toe Great: Clean wound with Normal Saline. Cleanse wound with mild soap and water Wound #2 Left Calcaneus: Clean wound with Normal Saline. Cleanse wound with mild soap and water Wound #6 Left,Medial Metatarsal head first: Clean wound with Normal Saline. Cleanse wound with mild soap and water Anesthetic: Wound #1 Right Elbow: Topical Lidocaine 4% cream applied to wound bed prior to debridement - for clinic use Bullis, Tjuana ([id number]) Wound #11 Left,Dorsal Toe Great: Topical Lidocaine 4% cream applied to wound bed prior to debridement - for clinic use Wound #2 Left Calcaneus: Topical Lidocaine 4% cream applied to wound bed prior to debridement - for clinic use Wound #6 Left,Medial Metatarsal head first: Topical Lidocaine 4% cream applied to wound bed prior to debridement - for clinic use Skin Barriers/Peri-Wound Care: Wound #1 Right Elbow: Skin Prep Primary Wound Dressing: Wound #1 Right Elbow: Prisma Ag - moisten with normal saline Wound #11 Left,Dorsal Toe Great: Aquacel Ag Wound #2 Left Calcaneus: Santyl Ointment Wound #6 Left,Medial Metatarsal head first: Aquacel Ag Secondary Dressing: Wound #1 Right Elbow: Boardered Foam Dressing Wound #11 Left,Dorsal Toe Great: ABD pad Dry Gauze Conform/Kerlix - tape and netting Wound #2 Left Calcaneus: ABD pad Dry Gauze Conform/Kerlix - tape and netting Wound #6 Left,Medial Metatarsal head first: ABD pad Dry Gauze Conform/Kerlix - tape and netting Dressing Change Frequency: Wound #1 Right Elbow: Change dressing every other day. Wound #11 Left,Dorsal Toe Great: Change dressing every other day. Wound #2 Left Calcaneus: Change dressing every day. Wound #6 Left,Medial Metatarsal head first: Change dressing every other day. Follow-up Appointments: Wound #1 Right Elbow: Return Appointment in 1 week. Wound #11 Left,Dorsal Toe Great: Return Appointment in 1 week. Wound #2 Left  Calcaneus: Return Appointment in 1 week. Wound #6 Left,Medial Metatarsal head first: Return Appointment in 1 week. VALINA, MAES ([id number]) Edema Control: Wound #1 Right Elbow: Elevate legs to the level of the heart and pump ankles as often as possible Wound #11 Left,Dorsal Toe Great: Elevate legs to the level of the heart and pump ankles as often as possible Wound #2 Left Calcaneus: Elevate legs to the level of the heart and pump ankles as often as possible Wound #6 Left,Medial Metatarsal head first: Elevate legs to the level of the heart and pump ankles as often as possible Off-Loading: Wound #1 Right Elbow: Turn and reposition every 2 hours Wound #11 Left,Dorsal Toe Great: Turn and reposition every 2 hours Wound #2 Left Calcaneus: Turn and reposition every 2 hours Wound #6 Left,Medial Metatarsal head first: Turn and reposition every 2 hours Additional Orders / Instructions: Wound #1 Right Elbow: Increase protein intake. Wound #11 Left,Dorsal Toe Great: Increase protein intake. Wound #2 Left Calcaneus: Increase protein intake. Wound #6 Left,Medial Metatarsal head first: Increase  protein intake. Home Health: Wound #1 Right Elbow: Continue Home Health Visits - [facility] Nurse may visit PRN to address patient s wound care needs. FACE TO FACE ENCOUNTER: MEDICARE and MEDICAID PATIENTS: I certify that this patient is under my care and that I had a face-to-face encounter that meets the physician face-to-face encounter requirements with this patient on this date. The encounter with the patient was in whole or in part for the following MEDICAL CONDITION: (primary reason for Home Healthcare) MEDICAL NECESSITY: I certify, that based on my findings, NURSING services are a medically necessary home health service. HOME BOUND STATUS: I certify that my clinical findings support that this patient is homebound (i.e., Due to illness or injury, pt requires aid of supportive  devices such as crutches, cane, wheelchairs, walkers, the use of special transportation or the assistance of another person to leave their place of residence. There is a normal inability to leave the home and doing so requires considerable and taxing effort. Other absences are for medical reasons / religious services and are infrequent or of short duration when for other reasons). If current dressing causes regression in wound condition, may D/C ordered dressing product/s and apply Normal Saline Moist Dressing daily until next Wound Healing Center / Other MD appointment. Notify Wound Healing Center of regression in wound condition at [phone]. Please direct any NON-WOUND related issues/requests for orders to patient's Primary Care Physician Wound #11 Left,Dorsal Toe Great: Continue Home Health Visits - [facility] Nurse may visit PRN to address patient s wound care needs. FACE TO FACE ENCOUNTER: MEDICARE and MEDICAID PATIENTS: I certify that this patient is under my care and that I had a face-to-face encounter that meets the physician face-to-face encounter requirements with this patient on this date. The encounter with the patient was in whole or in part for the [place], [place] ([id number]) following MEDICAL CONDITION: (primary reason for Home Healthcare) MEDICAL NECESSITY: I certify, that based on my findings, NURSING services are a medically necessary home health service. HOME BOUND STATUS: I certify that my clinical findings support that this patient is homebound (i.e., Due to illness or injury, pt requires aid of supportive devices such as crutches, cane, wheelchairs, walkers, the use of special transportation or the assistance of another person to leave their place of residence. There is a normal inability to leave the home and doing so requires considerable and taxing effort. Other absences are for medical reasons / religious services and are infrequent or of short duration when  for other reasons). If current dressing causes regression in wound condition, may D/C ordered dressing product/s and apply Normal Saline Moist Dressing daily until next Wound Healing Center / Other MD appointment. Notify Wound Healing Center of regression in wound condition at [phone]. Please direct any NON-WOUND related issues/requests for orders to patient's Primary Care Physician Wound #2 Left Calcaneus: Continue Home Health Visits - [facility] Nurse may visit PRN to address patient s wound care needs. FACE TO FACE ENCOUNTER: MEDICARE and MEDICAID PATIENTS: I certify that this patient is under my care and that I had a face-to-face encounter that meets the physician face-to-face encounter requirements with this patient on this date. The encounter with the patient was in whole or in part for the following MEDICAL CONDITION: (primary reason for Home Healthcare) MEDICAL NECESSITY: I certify, that based on my findings, NURSING services are a medically necessary home health service. HOME BOUND STATUS: I certify that my clinical findings support that this patient  is homebound (i.e., Due to illness or injury, pt requires aid of supportive devices such as crutches, cane, wheelchairs, walkers, the use of special transportation or the assistance of another person to leave their place of residence. There is a normal inability to leave the home and doing so requires considerable and taxing effort. Other absences are for medical reasons / religious services and are infrequent or of short duration when for other reasons). If current dressing causes regression in wound condition, may D/C ordered dressing product/s and apply Normal Saline Moist Dressing daily until next Wound Healing Center / Other MD appointment. Notify Wound Healing Center of regression in wound condition at [phone]. Please direct any NON-WOUND related issues/requests for orders to patient's Primary Care  Physician Wound #6 Left,Medial Metatarsal head first: Continue Home Health Visits - [facility] Nurse may visit PRN to address patient s wound care needs. FACE TO FACE ENCOUNTER: MEDICARE and MEDICAID PATIENTS: I certify that this patient is under my care and that I had a face-to-face encounter that meets the physician face-to-face encounter requirements with this patient on this date. The encounter with the patient was in whole or in part for the following MEDICAL CONDITION: (primary reason for Home Healthcare) MEDICAL NECESSITY: I certify, that based on my findings, NURSING services are a medically necessary home health service. HOME BOUND STATUS: I certify that my clinical findings support that this patient is homebound (i.e., Due to illness or injury, pt requires aid of supportive devices such as crutches, cane, wheelchairs, walkers, the use of special transportation or the assistance of another person to leave their place of residence. There is a normal inability to leave the home and doing so requires considerable and taxing effort. Other absences are for medical reasons / religious services and are infrequent or of short duration when for other reasons). If current dressing causes regression in wound condition, may D/C ordered dressing product/s and apply Normal Saline Moist Dressing daily until next Wound Healing Center / Other MD appointment. Notify Wound Healing Center of regression in wound condition at [phone]. Please direct any NON-WOUND related issues/requests for orders to patient's Primary Care Physician Elwood, Wilhemina ([id number]) I have recommended: 1. Santyl ointment locally to the left heel wound, which should be applied daily. 2. Prisma AG to the right elbow. 3. Silver alginate to the left hallux 4. We have discussed offloading techniques in detail for the lower extremities 5. Nutrition, protein supplements and vitamin supplements have also been discussed and  the daughter says the nursing staff of being very compliant. 6. Patient's sister Kathie Rhodes had questions regarding whether the patient should be moved to a skilled nursing facility and I have no objections against this Electronic Signature(s) Signed: 02/05/2016 3:32:23 PM By: Evlyn Kanner MD, FACS Previous Signature: 02/05/2016 11:31:53 AM Version By: Evlyn Kanner MD, FACS Entered By: Evlyn Kanner on 02/05/2016 15:32:23 [name] ([id number]) -------------------------------------------------------------------------------- SuperBill Details Patient Name: [name] Date of Service: 02/05/2016 Medical Record Number: [id number] Patient Account Number: [id number] Date of Birth/Sex: 06/29/1929 (82 y.o. Female) Treating RN: Ashok Cordia, Debi Primary Care Physician: SYSTEM, PCP Other Clinician: Referring Physician: Treating Physician/Extender: Rudene Re in Treatment: 9 Diagnosis Coding ICD-10 Codes Code Description S41.101A Unspecified open wound of right upper arm, initial encounter L89.629 Pressure ulcer of left heel, unspecified stage I50.40 Unspecified combined systolic (congestive) and diastolic (congestive) heart failure I89.0 Lymphedema, not elsewhere classified L97.521 Non-pressure chronic ulcer of other part of left foot limited to breakdown of skin Facility Procedures CPT4: Description  Modifier Quantity Code [id number] 11042 - DEB SUBQ TISSUE 20 SQ CM/< 1 ICD-10 Description Diagnosis L89.629 Pressure ulcer of left heel, unspecified stage I50.40 Unspecified combined systolic (congestive) and diastolic (congestive) heart  failure Physician Procedures CPT4: Description Modifier Quantity Code [id number] 99213 - WC PHYS LEVEL 3 - EST PT 25 1 ICD-10 Description Diagnosis L97.521 Non-pressure chronic ulcer of other part of left foot limited to breakdown of skin CPT4: 8295621 11042 - WC PHYS SUBQ TISS 20 SQ CM 1 ICD-10 Description Diagnosis L89.629 Pressure ulcer of left heel, unspecified  stage I50.40 Unspecified combined systolic (congestive) and diastolic (congestive) heart failure Sustaita, Cade ([id number]) Electronic Signature(s) Signed: 02/05/2016 11:32:19 AM By: Evlyn Kanner MD, FACS Entered By: Evlyn Kanner on 02/05/2016 11:32:19

## 2016-02-05 NOTE — Progress Notes (Addendum)
LAKETHIA, COPPESS ([id number]) Visit Report for 02/05/2016 Arrival Information Details Patient Name: NISHA, DHAMI Date of Service: 02/05/2016 10:45 AM Medical Record Number: [id number] Patient Account Number: [id number] Date of Birth/Sex: 04/19/1929 (82 y.o. Female) Treating RN: Ashok Cordia, Debi Primary Care Physician: SYSTEM, PCP Other Clinician: Referring Physician: Treating Physician/Extender: Rudene Re in Treatment: 9 Visit Information History Since Last Visit All ordered tests and consults were completed: No Patient Arrived: Wheel Chair Added or deleted any medications: No Arrival Time: 10:56 Any new allergies or adverse reactions: No Accompanied By: sister Had a fall or experienced change in No Transfer Assistance: Nurse, adult activities of daily living that may affect Patient Identification Verified: Yes risk of falls: Secondary Verification Process Yes Signs or symptoms of abuse/neglect since last No Completed: visito Patient Requires Transmission- No Hospitalized since last visit: No Based Precautions: Pain Present Now: No Patient Has Alerts: Yes Patient Alerts: Patient on Blood Thinner ABI: (L) 1.56 Eliquis Electronic Signature(s) Signed: 02/05/2016 4:06:37 PM By: Alejandro Mulling Entered By: Alejandro Mulling on 02/05/2016 10:56:26 Mckowen, Johnny Bridge ([id number]) -------------------------------------------------------------------------------- Encounter Discharge Information Details Patient Name: [name] Date of Service: 02/05/2016 10:45 AM Medical Record Number: [id number] Patient Account Number: [id number] Date of Birth/Sex: 1929-05-19 (82 y.o. Female) Treating RN: Ashok Cordia, Debi Primary Care Physician: SYSTEM, PCP Other Clinician: Referring Physician: Treating Physician/Extender: Rudene Re in Treatment: 9 Encounter Discharge Information Items Discharge Pain Level: 0 Discharge Condition: Stable Ambulatory Status: Wheelchair Discharge Destination:  Nursing Home Transportation: Other Accompanied By: sister Schedule Follow-up Appointment: Yes Medication Reconciliation completed Yes and provided to Patient/Care Jahmel Flannagan: Provided on Clinical Summary of Care: 02/05/2016 Form Type Recipient Paper Patient MJ Electronic Signature(s) Signed: 02/05/2016 4:06:37 PM By: Alejandro Mulling Previous Signature: 02/05/2016 11:52:22 AM Version By: Gwenlyn Perking Entered By: Alejandro Mulling on 02/05/2016 11:57:29 Hand, Ilyana ([id number]) -------------------------------------------------------------------------------- Lower Extremity Assessment Details Patient Name: [name] Date of Service: 02/05/2016 10:45 AM Medical Record Number: [id number] Patient Account Number: [id number] Date of Birth/Sex: 1929-01-11 (82 y.o. Female) Treating RN: Ashok Cordia, Debi Primary Care Physician: SYSTEM, PCP Other Clinician: Referring Physician: Treating Physician/Extender: Rudene Re in Treatment: 9 Vascular Assessment Pulses: Posterior Tibial Dorsalis Pedis Palpable: [Right:No] Doppler: [Right:Monophasic] Extremity colors, hair growth, and conditions: Extremity Color: [Right:Mottled] Temperature of Extremity: [Right:Warm] Capillary Refill: [Right:< 3 seconds] Toe Nail Assessment Left: Right: Thick: Yes Discolored: Yes Deformed: No Improper Length and Hygiene: No Electronic Signature(s) Signed: 02/05/2016 4:06:37 PM By: Alejandro Mulling Entered By: Alejandro Mulling on 02/05/2016 11:04:42 Passon, Johnny Bridge ([id number]) -------------------------------------------------------------------------------- Multi Wound Chart Details Patient Name: [name] Date of Service: 02/05/2016 10:45 AM Medical Record Number: [id number] Patient Account Number: [id number] Date of Birth/Sex: 04/07/1929 (82 y.o. Female) Treating RN: Ashok Cordia, Debi Primary Care Physician: SYSTEM, PCP Other Clinician: Referring Physician: Treating Physician/Extender: Rudene Re  in Treatment: 9 Vital Signs Height(in): 64 Pulse(bpm): 73 Weight(lbs): 153 Blood Pressure 124/71 (mmHg): Body Mass Index(BMI): 26 Temperature(F): Respiratory Rate 16 (breaths/min): Photos: [1:No Photos] [2:No Photos] [6:No Photos] Wound Location: [1:Right Elbow] [2:Left Calcaneus] [6:Left Metatarsal head first - Medial] Wounding Event: [1:Gradually Appeared] [2:Gradually Appeared] [6:Pressure Injury] Primary Etiology: [1:Infection - not elsewhere classified] [2:Pressure Ulcer] [6:Pressure Ulcer] Comorbid History: [1:Cataracts, Lymphedema, Aspiration, Chronic Obstructive Pulmonary Disease (COPD), Arrhythmia, Congestive Heart Failure, Hypertension, Peripheral Venous Disease, Osteoarthritis, Dementia] [2:Cataracts, Lymphedema, Aspiration, Chronic  Obstructive Pulmonary Disease (COPD), Arrhythmia, Congestive Heart Failure, Hypertension, Peripheral Venous Disease, Osteoarthritis, Dementia] [6:Cataracts, Lymphedema, Aspiration, Chronic Obstructive Pulmonary Disease (COPD), Arrhythmia, Congestive  Heart Failure, Hypertension, Peripheral Venous Disease, Osteoarthritis, Dementia] Date Acquired: [1:11/14/2015] [2:10/30/2015] [6:12/25/2015] Weeks  of Treatment: [1:9] [2:8] [6:6] Wound Status: [1:Open] [2:Open] [6:Open] Measurements L x W x D 2.4x1.6x1.1 [2:2.2x5.1x0.2] [6:0.6x0.7x0.1] (cm) Area (cm) : [1:3.016] [2:8.812] [6:0.33] Volume (cm) : [1:3.318] [2:1.762] [6:0.033] % Reduction in Area: [1:72.60%] [2:-87.00%] [6:47.50%] % Reduction in Volume: 82.20% [2:-274.10%] [6:47.60%] Classification: [1:Full Thickness Without Exposed Support Structures] [2:Category/Stage III] [6:Category/Stage II] Exudate Amount: [1:Large] [2:Large] [6:Medium] Exudate Type: [1:Serous] [2:Serous] [6:Serosanguineous] Exudate Color: [1:amber] [2:amber] [6:red, brown] Foul Odor After Yes No No Cleansing: Odor Anticipated Due to No N/A N/A Product Use: Wound Margin: Flat and Intact Flat and Intact Distinct, outline  attached Granulation Amount: Medium (34-66%) Medium (34-66%) Small (1-33%) Granulation Quality: Pink Pink Pink, Pale Necrotic Amount: Medium (34-66%) Medium (34-66%) Large (67-100%) Necrotic Tissue: Adherent Slough Eschar, Adherent Slough Adherent Slough Exposed Structures: Fascia: No Fascia: No Fascia: No Fat: No Fat: No Fat: No Tendon: No Tendon: No Tendon: No Muscle: No Muscle: No Muscle: No Joint: No Joint: No Joint: No Bone: No Bone: No Bone: No Limited to Skin Limited to Skin Limited to Skin Breakdown Breakdown Breakdown Epithelialization: None None Medium (34-66%) Periwound Skin Texture: Edema: Yes Edema: Yes Edema: No Excoriation: No Induration: No Callus: No Crepitus: No Fluctuance: No Friable: No Rash: No Scarring: No Periwound Skin Moist: Yes Maceration: Yes Dry/Scaly: Yes Moisture: Moist: Yes Maceration: No Dry/Scaly: No Periwound Skin Color: Erythema: Yes Atrophie Blanche: No Erythema: Yes Cyanosis: No Ecchymosis: No Erythema: No Hemosiderin Staining: No Mottled: No Pallor: No Rubor: No Erythema Location: Circumferential N/A Circumferential Erythema Change: N/A N/A Decreased Temperature: No Abnormality No Abnormality No Abnormality Tenderness on Yes Yes Yes Palpation: Wound Preparation: Ulcer Cleansing: Ulcer Cleansing: Ulcer Cleansing: Rinsed/Irrigated with Rinsed/Irrigated with Rinsed/Irrigated with Saline Saline Saline Topical Anesthetic Topical Anesthetic Topical Anesthetic Applied: Other: lidocaine Applied: Other: lidocaine Applied: Other: lidocaine 4% 4% 4% Heckert, Daylah ([id number]) Treatment Notes Electronic Signature(s) Signed: 02/05/2016 4:06:37 PM By: Alejandro Mulling Entered By: Alejandro Mulling on 02/05/2016 11:13:40 [name] ([id number]) -------------------------------------------------------------------------------- Multi-Disciplinary Care Plan Details Patient Name: [name] Date of Service: 02/05/2016 10:45  AM Medical Record Number: [id number] Patient Account Number: [id number] Date of Birth/Sex: Jan 05, 1929 (82 y.o. Female) Treating RN: Ashok Cordia, Debi Primary Care Physician: SYSTEM, PCP Other Clinician: Referring Physician: Treating Physician/Extender: Rudene Re in Treatment: 9 Active Inactive Electronic Signature(s) Signed: 02/27/2016 12:45:51 PM By: Elliot Gurney RN, BSN, Kim RN, BSN Signed: 02/27/2016 2:11:14 PM By: Alejandro Mulling Previous Signature: 02/05/2016 4:06:37 PM Version By: Alejandro Mulling Entered By: Elliot Gurney RN, BSN, Kim on 02/20/2016 12:19:47 [name] ([id number]) -------------------------------------------------------------------------------- Pain Assessment Details Patient Name: [name] Date of Service: 02/05/2016 10:45 AM Medical Record Number: [id number] Patient Account Number: [id number] Date of Birth/Sex: 1929/06/29 (82 y.o. Female) Treating RN: Ashok Cordia, Debi Primary Care Physician: SYSTEM, PCP Other Clinician: Referring Physician: Treating Physician/Extender: Rudene Re in Treatment: 9 Active Problems Location of Pain Severity and Description of Pain Patient Has Paino No Site Locations With Dressing Change: No Pain Management and Medication Current Pain Management: Electronic Signature(s) Signed: 02/05/2016 4:06:37 PM By: Alejandro Mulling Entered By: Alejandro Mulling on 02/05/2016 10:56:33 [name] ([id number]) -------------------------------------------------------------------------------- Patient/Caregiver Education Details Patient Name: [name] Date of Service: 02/05/2016 10:45 AM Medical Record Number: [id number] Patient Account Number: [id number] Date of Birth/Gender: 11-Jul-1928 (82 y.o. Female) Treating RN: Ashok Cordia, Debi Primary Care Physician: SYSTEM, PCP Other Clinician: Referring Physician: Treating Physician/Extender: Rudene Re in Treatment: 9 Education Assessment Education Provided To: Patient Education  Topics Provided Wound/Skin Impairment: Handouts: Other: change dressing as ordered Methods: Demonstration, Explain/Verbal Responses: State content correctly  Electronic Signature(s) Signed: 02/05/2016 4:06:37 PM By: Alejandro MullingPinkerton, Debra Entered By: Alejandro MullingPinkerton, Debra on 02/05/2016 11:57:43 Tonche, Johnny BridgeMARTHA ([id number]) -------------------------------------------------------------------------------- Wound Assessment Details Patient Name: Elray BubaJOBE, Dylana Date of Service: 02/05/2016 10:45 AM Medical Record Number: [id number] Patient Account Number: [id number] Date of Birth/Sex: Feb 28, 1929 (82 y.o. Female) Treating RN: Ashok CordiaPinkerton, Debi Primary Care Physician: SYSTEM, PCP Other Clinician: Referring Physician: Treating Physician/Extender: Rudene ReBritto, Errol Weeks in Treatment: 9 Wound Status Wound Number: 1 Primary Infection - not elsewhere classified Etiology: Wound Location: Right Elbow Wound Open Wounding Event: Gradually Appeared Status: Date Acquired: 11/14/2015 Comorbid Cataracts, Lymphedema, Aspiration, Weeks Of Treatment: 9 History: Chronic Obstructive Pulmonary Disease Clustered Wound: No (COPD), Arrhythmia, Congestive Heart Failure, Hypertension, Peripheral Venous Disease, Osteoarthritis, Dementia Photos Photo Uploaded By: Alejandro MullingPinkerton, Debra on 02/05/2016 15:31:32 Wound Measurements Length: (cm) 2.4 Width: (cm) 1.6 Depth: (cm) 1.1 Area: (cm) 3.016 Volume: (cm) 3.318 % Reduction in Area: 72.6% % Reduction in Volume: 82.2% Epithelialization: None Tunneling: No Undermining: No Wound Description Full Thickness Without Exposed Foul Odor Af Classification: Support Structures Due to Produ Wound Margin: Flat and Intact Exudate Large Amount: Exudate Type: Serous Exudate Color: amber ter Cleansing: Yes ct Use: No Wound Bed Suitt, Tanesia ([id number]) Granulation Amount: Medium (34-66%) Exposed Structure Granulation Quality: Pink Fascia Exposed: No Necrotic Amount: Medium (34-66%) Fat  Layer Exposed: No Necrotic Quality: Adherent Slough Tendon Exposed: No Muscle Exposed: No Joint Exposed: No Bone Exposed: No Limited to Skin Breakdown Periwound Skin Texture Texture Color No Abnormalities Noted: No No Abnormalities Noted: No Localized Edema: Yes Erythema: Yes Erythema Location: Circumferential Moisture No Abnormalities Noted: No Temperature / Pain Moist: Yes Temperature: No Abnormality Tenderness on Palpation: Yes Wound Preparation Ulcer Cleansing: Rinsed/Irrigated with Saline Topical Anesthetic Applied: Other: lidocaine 4%, Electronic Signature(s) Signed: 02/05/2016 4:06:37 PM By: Alejandro MullingPinkerton, Debra Entered By: Alejandro MullingPinkerton, Debra on 02/05/2016 11:07:50 Elray BubaJOBE, Mardee ([id number]) -------------------------------------------------------------------------------- Wound Assessment Details Patient Name: Elray BubaJOBE, Jaymarie Date of Service: 02/05/2016 10:45 AM Medical Record Number: [id number] Patient Account Number: [id number] Date of Birth/Sex: Feb 28, 1929 (82 y.o. Female) Treating RN: Ashok CordiaPinkerton, Debi Primary Care Physician: SYSTEM, PCP Other Clinician: Referring Physician: Treating Physician/Extender: Rudene ReBritto, Errol Weeks in Treatment: 9 Wound Status Wound Number: 11 Primary Pressure Ulcer Etiology: Wound Location: Left Toe Great - Dorsal Wound Open Wounding Event: Blister Status: Date Acquired: 02/05/2016 Comorbid Cataracts, Lymphedema, Aspiration, Weeks Of Treatment: 0 History: Chronic Obstructive Pulmonary Disease Clustered Wound: No (COPD), Arrhythmia, Congestive Heart Failure, Hypertension, Peripheral Venous Disease, Osteoarthritis, Dementia Photos Photo Uploaded By: Alejandro MullingPinkerton, Debra on 02/05/2016 15:31:33 Wound Measurements Length: (cm) 1 Width: (cm) 1 Depth: (cm) 0.1 Area: (cm) 0.785 Volume: (cm) 0.079 % Reduction in Area: 0% % Reduction in Volume: 0% Epithelialization: None Tunneling: No Undermining: No Wound Description Classification:  Category/Stage II Wound Margin: Flat and Intact Exudate Amount: Small Exudate Type: Serous Exudate Color: amber Foul Odor After Cleansing: No Wound Bed Granulation Amount: Large (67-100%) Exposed Structure Granulation Quality: Pink Fascia Exposed: No Kraai, Lanette ([id number]) Necrotic Amount: None Present (0%) Fat Layer Exposed: No Tendon Exposed: No Muscle Exposed: No Joint Exposed: No Bone Exposed: No Limited to Skin Breakdown Periwound Skin Texture Texture Color No Abnormalities Noted: No No Abnormalities Noted: No Localized Edema: Yes Erythema: Yes Erythema Location: Circumferential Moisture No Abnormalities Noted: No Temperature / Pain Temperature: No Abnormality Tenderness on Palpation: Yes Wound Preparation Ulcer Cleansing: Rinsed/Irrigated with Saline Topical Anesthetic Applied: None Electronic Signature(s) Signed: 02/05/2016 4:06:37 PM By: Alejandro MullingPinkerton, Debra Entered By: Alejandro MullingPinkerton, Debra on 02/05/2016 11:26:36 Hincapie, Alishah ([id number]) -------------------------------------------------------------------------------- Wound Assessment Details Patient Name: Elray BubaJOBE, Pernie  Date of Service: 02/05/2016 10:45 AM Medical Record Number: [id number] Patient Account Number: [id number] Date of Birth/Sex: 04/23/1929 (82 y.o. Female) Treating RN: Ashok Cordia, Debi Primary Care Physician: SYSTEM, PCP Other Clinician: Referring Physician: Treating Physician/Extender: Rudene Re in Treatment: 9 Wound Status Wound Number: 2 Primary Pressure Ulcer Etiology: Wound Location: Left Calcaneus Wound Open Wounding Event: Gradually Appeared Status: Date Acquired: 10/30/2015 Comorbid Cataracts, Lymphedema, Aspiration, Weeks Of Treatment: 8 History: Chronic Obstructive Pulmonary Disease Clustered Wound: No (COPD), Arrhythmia, Congestive Heart Failure, Hypertension, Peripheral Venous Disease, Osteoarthritis, Dementia Photos Photo Uploaded By: Alejandro Mulling on 02/05/2016  15:32:12 Wound Measurements Length: (cm) 2.2 Width: (cm) 5.1 Depth: (cm) 0.2 Area: (cm) 8.812 Volume: (cm) 1.762 % Reduction in Area: -87% % Reduction in Volume: -274.1% Epithelialization: None Tunneling: No Undermining: No Wound Description Classification: Category/Stage III Wound Margin: Flat and Intact Exudate Amount: Large Exudate Type: Serous Exudate Color: amber Foul Odor After Cleansing: No Wound Bed Granulation Amount: Medium (34-66%) Exposed Structure Granulation Quality: Pink Fascia Exposed: No Latka, Annmarie ([id number]) Necrotic Amount: Medium (34-66%) Fat Layer Exposed: No Necrotic Quality: Eschar, Adherent Slough Tendon Exposed: No Muscle Exposed: No Joint Exposed: No Bone Exposed: No Limited to Skin Breakdown Periwound Skin Texture Texture Color No Abnormalities Noted: No No Abnormalities Noted: No Callus: No Atrophie Blanche: No Crepitus: No Cyanosis: No Excoriation: No Ecchymosis: No Fluctuance: No Erythema: No Friable: No Hemosiderin Staining: No Induration: No Mottled: No Localized Edema: Yes Pallor: No Rash: No Rubor: No Scarring: No Temperature / Pain Moisture Temperature: No Abnormality No Abnormalities Noted: No Tenderness on Palpation: Yes Dry / Scaly: No Maceration: Yes Moist: Yes Wound Preparation Ulcer Cleansing: Rinsed/Irrigated with Saline Topical Anesthetic Applied: Other: lidocaine 4%, Electronic Signature(s) Signed: 02/05/2016 4:06:37 PM By: Alejandro Mulling Entered By: Alejandro Mulling on 02/05/2016 11:13:24 [name] ([id number]) -------------------------------------------------------------------------------- Wound Assessment Details Patient Name: [name] Date of Service: 02/05/2016 10:45 AM Medical Record Number: [id number] Patient Account Number: [id number] Date of Birth/Sex: 1929/01/22 (82 y.o. Female) Treating RN: Ashok Cordia, Debi Primary Care Physician: SYSTEM, PCP Other Clinician: Referring  Physician: Treating Physician/Extender: Rudene Re in Treatment: 9 Wound Status Wound Number: 6 Primary Pressure Ulcer Etiology: Wound Location: Left Metatarsal head first - Medial Wound Open Status: Wounding Event: Pressure Injury Comorbid Cataracts, Lymphedema, Aspiration, Date Acquired: 12/25/2015 History: Chronic Obstructive Pulmonary Disease Weeks Of Treatment: 6 (COPD), Arrhythmia, Congestive Heart Clustered Wound: No Failure, Hypertension, Peripheral Venous Disease, Osteoarthritis, Dementia Photos Photo Uploaded By: Alejandro Mulling on 02/05/2016 15:32:14 Wound Measurements Length: (cm) 0.6 Width: (cm) 0.7 Depth: (cm) 0.1 Area: (cm) 0.33 Volume: (cm) 0.033 % Reduction in Area: 47.5% % Reduction in Volume: 47.6% Epithelialization: Medium (34-66%) Tunneling: No Undermining: No Wound Description Classification: Category/Stage II Wound Margin: Distinct, outline attached Exudate Amount: Medium Exudate Type: Serosanguineous Exudate Color: red, brown Foul Odor After Cleansing: No Wound Bed Granulation Amount: Small (1-33%) Exposed Structure Granulation Quality: Pink, Pale Fascia Exposed: No Diem, Lety ([id number]) Necrotic Amount: Large (67-100%) Fat Layer Exposed: No Necrotic Quality: Adherent Slough Tendon Exposed: No Muscle Exposed: No Joint Exposed: No Bone Exposed: No Limited to Skin Breakdown Periwound Skin Texture Texture Color No Abnormalities Noted: No No Abnormalities Noted: No Localized Edema: No Erythema: Yes Erythema Location: Circumferential Moisture Erythema Change: Decreased No Abnormalities Noted: No Dry / Scaly: Yes Temperature / Pain Maceration: No Temperature: No Abnormality Tenderness on Palpation: Yes Wound Preparation Ulcer Cleansing: Rinsed/Irrigated with Saline Topical Anesthetic Applied: Other: lidocaine 4%, Electronic Signature(s) Signed: 02/05/2016 4:06:37 PM By: Alejandro Mulling Entered By: Alejandro Mulling  on 02/05/2016 11:10:54 JACALYN, BIGGS ([id number]) -------------------------------------------------------------------------------- Vitals Details Patient Name: TESIA, LYBRAND Date of Service: 02/05/2016 10:45 AM Medical Record Number: [id number] Patient Account Number: [id number] Date of Birth/Sex: [birth year] (82 y.o. Female) Treating RN: Ashok Cordia, Debi Primary Care Physician: SYSTEM, PCP Other Clinician: Referring Physician: Treating Physician/Extender: Rudene Re in Treatment: 9 Vital Signs Time Taken: 10:56 Pulse (bpm): 73 Height (in): 64 Respiratory Rate (breaths/min): 16 Weight (lbs): 153 Blood Pressure (mmHg): 124/71 Body Mass Index (BMI): 26.3 Reference Range: 80 - 120 mg / dl Electronic Signature(s) Signed: 02/05/2016 4:06:37 PM By: Alejandro Mulling Entered By: Alejandro Mulling on 02/05/2016 10:56:53

## 2016-02-12 ENCOUNTER — Ambulatory Visit: Payer: Medicare Other | Admitting: Surgery

## 2016-02-12 DIAGNOSIS — I5022 Chronic systolic (congestive) heart failure: Secondary | ICD-10-CM | POA: Diagnosis not present

## 2016-02-12 DIAGNOSIS — F0150 Vascular dementia without behavioral disturbance: Secondary | ICD-10-CM

## 2016-02-12 DIAGNOSIS — R1310 Dysphagia, unspecified: Secondary | ICD-10-CM | POA: Diagnosis not present

## 2016-02-12 DIAGNOSIS — F39 Unspecified mood [affective] disorder: Secondary | ICD-10-CM

## 2016-02-12 DIAGNOSIS — I482 Chronic atrial fibrillation: Secondary | ICD-10-CM

## 2016-02-12 DIAGNOSIS — L89623 Pressure ulcer of left heel, stage 3: Secondary | ICD-10-CM | POA: Diagnosis not present

## 2016-03-14 DIAGNOSIS — R1310 Dysphagia, unspecified: Secondary | ICD-10-CM | POA: Diagnosis not present

## 2016-03-14 DIAGNOSIS — I482 Chronic atrial fibrillation: Secondary | ICD-10-CM

## 2016-03-14 DIAGNOSIS — F39 Unspecified mood [affective] disorder: Secondary | ICD-10-CM

## 2016-03-14 DIAGNOSIS — E039 Hypothyroidism, unspecified: Secondary | ICD-10-CM | POA: Diagnosis not present

## 2016-03-14 DIAGNOSIS — F0150 Vascular dementia without behavioral disturbance: Secondary | ICD-10-CM

## 2016-03-14 DIAGNOSIS — I5022 Chronic systolic (congestive) heart failure: Secondary | ICD-10-CM

## 2016-03-29 ENCOUNTER — Ambulatory Visit (INDEPENDENT_AMBULATORY_CARE_PROVIDER_SITE_OTHER): Payer: Medicare Other | Admitting: Cardiovascular Disease

## 2016-03-29 ENCOUNTER — Encounter: Payer: Self-pay | Admitting: Cardiovascular Disease

## 2016-03-29 VITALS — BP 110/60 | HR 79 | Ht 63.0 in | Wt 147.0 lb

## 2016-03-29 DIAGNOSIS — I5022 Chronic systolic (congestive) heart failure: Secondary | ICD-10-CM

## 2016-03-29 DIAGNOSIS — I10 Essential (primary) hypertension: Secondary | ICD-10-CM

## 2016-03-29 DIAGNOSIS — I4891 Unspecified atrial fibrillation: Secondary | ICD-10-CM | POA: Diagnosis not present

## 2016-03-29 DIAGNOSIS — R600 Localized edema: Secondary | ICD-10-CM

## 2016-03-29 DIAGNOSIS — R4189 Other symptoms and signs involving cognitive functions and awareness: Secondary | ICD-10-CM

## 2016-03-29 NOTE — Patient Instructions (Addendum)
Medication Instructions:   No medication changes made  Labwork:  We will check a BMP and CBC today  Testing/Procedures:  No further testing at this time   Follow-Up: It was a pleasure seeing you in the office today. Please call us if you have new issues that need to be addressed before your next appt.  [phone]  Your physician wants you to follow-up in: 6 months.  You will receive a reminder letter in the mail two months in advance. If you don't receive a letter, please call our office to schedule the follow-up appointment.  If you need a refill on your cardiac medications before your next appointment, please call your pharmacy.

## 2016-03-29 NOTE — Progress Notes (Signed)
Cardiology Office Note  Date:  03/29/2016   ID:  Anita BubaMartha Kirk, DOB 1929/06/22, MRN [id number]  PCP:  [facility]  Chief Complaint  Patient presents with  . OTHER    3 month f/u edema left leg. Meds reviewed verbally with pt.    HPI:  [name] is a 82 year old woman with history of HTN, gait instability, cognitive impairment, depression, and panic attacks who returns for routine follow up of a-fib with RVR and chronic systolic CHF.   She lives in a skilled nursing facility, Family helps to take care of her Previous ejection fraction 45-50%, significant MR, TR, mildly elevated right heart pressures  She presents today in a wheelchair. Daughter presents with her Recent [facility]  Review of her active issues shows she is improving Lower extremity edema improved with Lasix 40 mg daily,  Denies any weeping or sores Unable to ambulate   No recent lab work available for review on higher dose Lasix Previously with anemia, no recent CBC  She denies any shortness of breath or chest pain concerning for angina  Previously in the emergency room 12/17/2015 for increasing leg edema Recommended to increase Lasix  Unable to wear compression hose, this caused wounds on her legs  EKG on today's visit shows atrial fibrillation with ventricular rate 79 bpm  Other past medical history Previous hospitalization 08/13/2015 for parotiditis, sepsis, atrial fibrillation with RVR Treated with antibiotics with improvement of her symptoms Potassium was low at the time of admission  essentially wheelchair bound as her legs are now very weak. significant tremor.  She was in the hospital January 2016 for atrial fibrillation, acute systolic CHF [facility] 4/09-8/11/91471/15-1/18/2016   Prior to her above admission January 2016, over the past 2-3 weeks she noted a sensation of "hot" and would pat her chest. Per her sister her care providers felt like  this was her anxiety. No work up was pursued at that time. She denied any chest pain. Ultimately, her weakness continued to progress to the point that she was unable to stand from a seated postion, prompting her to be [facility] for [facility] evaluation. Weight has remained stable.   Upon her [facility] to [facility] she was found to be in new onset a-fib with RVR, rates into the 160s at times, She was already on propranolol for her anxiety. Her TSH was found to be in the 6 range with normal free T4. Echo showed EF 45-50%, dilated LA of 4.5 cm, dilated RA, moderate to severe MR, moderate TR, mildly elevated PASP at 45.2 mm Hg.  Head CT showed chronic infarcts along the right frontal and temporal lobes, with associated encephalomalacia.   CHADSVASc score >= 6 giving her an annual estimated risk of stroke of 9%.   PMH:   has a past medical history of Anxiety disorder; Arthritis; Chronic systolic CHF (congestive heart failure) (HCC); Cognitive impairment; Depression; Gait instability; HTN (hypertension); Hyperlipidemia; Panic attacks; Parkinson's disease (HCC); and Persistent atrial fibrillation (HCC).  PSH:    Past Surgical History:  Procedure Laterality Date  . EYE SURGERY      Current Outpatient Prescriptions  Medication Sig Dispense Refill  . acetaminophen (TYLENOL) 325 MG tablet Take 650 mg by mouth every 6 (six) hours as needed for mild pain, moderate pain or fever.    Marland Kitchen. apixaban (ELIQUIS) 5 MG TABS tablet Take 1 tablet (5 mg total) by mouth 2 (two) times daily. 60 tablet  0  . fexofenadine (ALLEGRA) 180 MG tablet Take 180 mg by mouth daily.    . furosemide (LASIX) 40 MG tablet Take 40 mg by mouth daily.    Marland Kitchen guaiFENesin-dextromethorphan (ROBITUSSIN DM) 100-10 MG/5ML syrup Take 10 mLs by mouth every 6 (six) hours as needed for cough.    Marland Kitchen ipratropium-albuterol (DUONEB) 0.5-2.5 (3) MG/3ML SOLN Take 3 mLs by nebulization 2 (two) times daily. 360 mL 1  . levothyroxine (SYNTHROID, LEVOTHROID) 100  MCG tablet Take 50 mcg by mouth daily before breakfast.    . metoprolol (LOPRESSOR) 50 MG tablet Take 50 mg by mouth 2 (two) times daily.     . Multiple Vitamin (MULTIVITAMIN) tablet Take 1 tablet by mouth daily.    Marland Kitchen PARoxetine (PAXIL) 20 MG tablet Take 20 mg by mouth every morning.     . potassium chloride SA (K-DUR,KLOR-CON) 20 MEQ tablet Take 40 mEq by mouth 2 (two) times daily.      No current facility-administered medications for this visit.      Allergies:   Aliskiren-hydrochlorothiazide; Amlodipine; and Sulfa antibiotics   Social History:  The patient  reports that she has never smoked. She has never used smokeless tobacco. She reports that she does not drink alcohol or use drugs.   Family History:   family history includes Heart disease in her sister.    Review of Systems: Review of Systems  Unable to perform ROS: Patient nonverbal     PHYSICAL EXAM: VS:  BP 110/60 (BP Location: Left Arm, Patient Position: Sitting, Cuff Size: Large)   Pulse 79   Ht 5\' 3"  (1.6 m)   Wt 147 lb (66.7 kg)   BMI 26.04 kg/m  , BMI Body mass index is 26.04 kg/m. GEN: Well nourished, well developed, in no acute distress , presenting a wheelchair HEENT: normal  Neck: no JVD, carotid bruits, or masses Cardiac: RRR; no murmurs, rubs, or gallops,no edema  Respiratory:  Poor inspiratory effort, dullness at the bases bilaterally GI: soft, nontender, nondistended, + BS MS: no deformity or atrophy  Skin: warm and dry, no rash Neuro:  Strength and sensation are intact Psych: Appeared happy, relatively nonverbal,    Recent Labs: 09/17/2015: ALT 29 09/19/2015: Magnesium 2.0 12/17/2015: B Natriuretic Peptide 251.0; BUN 18; Creatinine, Ser 0.98; Hemoglobin 9.3; Platelets 298; Potassium 3.9; Sodium 140    Lipid Panel No results found for: CHOL, HDL, LDLCALC, TRIG    Wt Readings from Last 3 Encounters:  03/29/16 147 lb (66.7 kg)  12/27/15 153 lb (69.4 kg)  12/17/15 160 lb (72.6 kg)        ASSESSMENT AND PLAN:  Atrial fibrillation with RVR (HCC) - Plan: EKG 12-Lead, Basic Metabolic Panel (BMET), CBC with Differential/Platelet Tolerating anticoagulation, rate relatively well-controlled No changes to her medications  Chronic systolic CHF (congestive heart failure) (HCC) - Plan: Basic Metabolic Panel (BMET), CBC with Differential/Platelet Appears relatively euvolemic Blood work ordered for today including BMP, CBC We'll continue Lasix 40 daily  Essential hypertension Blood pressure is well controlled on today's visit. No changes made to the medications.  Bilateral leg edema Leg edema essentially resolved Daughter very happy with her current situation As above, lab work pending  Cognitive impairment Daughter does report worsening cognitive issues Concerned about her confusion, worsening dementia   Total encounter time more than 25 minutes  Greater than 50% was spent in counseling and coordination of care with the patient   Disposition:   F/U  6 months   Orders Placed This  Encounter  Procedures  . Basic Metabolic Panel (BMET)  . CBC with Differential/Platelet  . EKG 12-Lead     Signed, Dossie Arbour, M.D., Ph.D. 03/29/2016  [facility] [place], [place] [phone]

## 2016-03-30 LAB — BASIC METABOLIC PANEL
BUN / CREAT RATIO: 42 — AB (ref 12–28)
BUN: 33 mg/dL — ABNORMAL HIGH (ref 8–27)
CHLORIDE: 102 mmol/L (ref 96–106)
CO2: 30 mmol/L — AB (ref 18–29)
Calcium: 9 mg/dL (ref 8.7–10.3)
Creatinine, Ser: 0.79 mg/dL (ref 0.57–1.00)
GFR calc Af Amer: 79 mL/min/{1.73_m2} (ref >59)
GFR calc non Af Amer: 68 mL/min/{1.73_m2} (ref >59)
GLUCOSE: 121 mg/dL — AB (ref 65–99)
POTASSIUM: 3.7 mmol/L (ref 3.5–5.2)
SODIUM: 146 mmol/L — AB (ref 134–144)

## 2016-03-30 LAB — CBC WITH DIFFERENTIAL/PLATELET
Basophils Absolute: 0 10*3/uL (ref 0.0–0.2)
Basos: 0 %
EOS (ABSOLUTE): 0.2 10*3/uL (ref 0.0–0.4)
Eos: 3 %
Hematocrit: 33.5 % — ABNORMAL LOW (ref 34.0–46.6)
Hemoglobin: 10.8 g/dL — ABNORMAL LOW (ref 11.1–15.9)
IMMATURE GRANULOCYTES: 0 %
Immature Grans (Abs): 0 10*3/uL (ref 0.0–0.1)
LYMPHS ABS: 1.1 10*3/uL (ref 0.7–3.1)
Lymphs: 16 %
MCH: 31.4 pg (ref 26.6–33.0)
MCHC: 32.2 g/dL (ref 31.5–35.7)
MCV: 97 fL (ref 79–97)
MONOS ABS: 0.8 10*3/uL (ref 0.1–0.9)
Monocytes: 12 %
NEUTROS PCT: 69 %
Neutrophils Absolute: 4.7 10*3/uL (ref 1.4–7.0)
PLATELETS: 297 10*3/uL (ref 150–379)
RBC: 3.44 x10E6/uL — AB (ref 3.77–5.28)
RDW: 15.1 % (ref 12.3–15.4)
WBC: 6.9 10*3/uL (ref 3.4–10.8)

## 2016-04-17 DIAGNOSIS — E039 Hypothyroidism, unspecified: Secondary | ICD-10-CM

## 2016-04-17 DIAGNOSIS — F0150 Vascular dementia without behavioral disturbance: Secondary | ICD-10-CM

## 2016-04-17 DIAGNOSIS — I4891 Unspecified atrial fibrillation: Secondary | ICD-10-CM

## 2016-04-17 DIAGNOSIS — F39 Unspecified mood [affective] disorder: Secondary | ICD-10-CM

## 2016-04-17 DIAGNOSIS — R1312 Dysphagia, oropharyngeal phase: Secondary | ICD-10-CM

## 2016-04-17 DIAGNOSIS — L97909 Non-pressure chronic ulcer of unspecified part of unspecified lower leg with unspecified severity: Secondary | ICD-10-CM

## 2016-04-17 DIAGNOSIS — I5020 Unspecified systolic (congestive) heart failure: Secondary | ICD-10-CM

## 2016-04-23 DIAGNOSIS — S43004A Unspecified dislocation of right shoulder joint, initial encounter: Secondary | ICD-10-CM | POA: Diagnosis not present

## 2016-05-26 DIAGNOSIS — F0150 Vascular dementia without behavioral disturbance: Secondary | ICD-10-CM | POA: Diagnosis not present

## 2016-05-26 DIAGNOSIS — I482 Chronic atrial fibrillation: Secondary | ICD-10-CM

## 2016-05-26 DIAGNOSIS — F39 Unspecified mood [affective] disorder: Secondary | ICD-10-CM | POA: Diagnosis not present

## 2016-05-26 DIAGNOSIS — I5022 Chronic systolic (congestive) heart failure: Secondary | ICD-10-CM | POA: Diagnosis not present

## 2016-07-16 DIAGNOSIS — R05 Cough: Secondary | ICD-10-CM | POA: Diagnosis not present

## 2016-07-16 DIAGNOSIS — R509 Fever, unspecified: Secondary | ICD-10-CM | POA: Diagnosis not present

## 2016-07-24 DIAGNOSIS — J069 Acute upper respiratory infection, unspecified: Secondary | ICD-10-CM | POA: Diagnosis not present

## 2016-08-03 ENCOUNTER — Emergency Department
Admission: EM | Admit: 2016-08-03 | Discharge: 2016-08-03 | Disposition: A | Discharge status: Home / Self Care | Payer: Medicare Other | Attending: Emergency Medicine | Admitting: Emergency Medicine

## 2016-08-03 ENCOUNTER — Emergency Department: Payer: Medicare Other

## 2016-08-03 DIAGNOSIS — I110 Hypertensive heart disease with heart failure: Secondary | ICD-10-CM | POA: Insufficient documentation

## 2016-08-03 DIAGNOSIS — I5022 Chronic systolic (congestive) heart failure: Secondary | ICD-10-CM | POA: Insufficient documentation

## 2016-08-03 DIAGNOSIS — R05 Cough: Secondary | ICD-10-CM | POA: Diagnosis not present

## 2016-08-03 DIAGNOSIS — R059 Cough, unspecified: Secondary | ICD-10-CM

## 2016-08-03 DIAGNOSIS — Z79899 Other long term (current) drug therapy: Secondary | ICD-10-CM | POA: Insufficient documentation

## 2016-08-03 DIAGNOSIS — G20 Parkinson's disease: Secondary | ICD-10-CM | POA: Diagnosis not present

## 2016-08-03 LAB — CBC WITH DIFFERENTIAL/PLATELET
Basophils Absolute: 0 10*3/uL (ref 0–0.1)
Basophils Relative: 0 %
EOS ABS: 0.2 10*3/uL (ref 0–0.7)
EOS PCT: 3 %
HCT: 35.7 % (ref 35.0–47.0)
Hemoglobin: 11.9 g/dL — ABNORMAL LOW (ref 12.0–16.0)
LYMPHS ABS: 1.3 10*3/uL (ref 1.0–3.6)
Lymphocytes Relative: 18 %
MCH: 33.9 pg (ref 26.0–34.0)
MCHC: 33.4 g/dL (ref 32.0–36.0)
MCV: 101.5 fL — ABNORMAL HIGH (ref 80.0–100.0)
MONO ABS: 0.7 10*3/uL (ref 0.2–0.9)
MONOS PCT: 10 %
Neutro Abs: 4.9 10*3/uL (ref 1.4–6.5)
Neutrophils Relative %: 69 %
PLATELETS: 328 10*3/uL (ref 150–440)
RBC: 3.52 MIL/uL — ABNORMAL LOW (ref 3.80–5.20)
RDW: 13.8 % (ref 11.5–14.5)
WBC: 7.1 10*3/uL (ref 3.6–11.0)

## 2016-08-03 LAB — URINALYSIS, COMPLETE (UACMP) WITH MICROSCOPIC
Bilirubin Urine: NEGATIVE
GLUCOSE, UA: NEGATIVE mg/dL
Hgb urine dipstick: NEGATIVE
Ketones, ur: NEGATIVE mg/dL
Nitrite: NEGATIVE
PH: 5 (ref 5.0–8.0)
PROTEIN: NEGATIVE mg/dL
Specific Gravity, Urine: 1.009 (ref 1.005–1.030)

## 2016-08-03 LAB — BASIC METABOLIC PANEL
Anion gap: 7 (ref 5–15)
BUN: 26 mg/dL — AB (ref 6–20)
CO2: 34 mmol/L — ABNORMAL HIGH (ref 22–32)
CREATININE: 0.9 mg/dL (ref 0.44–1.00)
Calcium: 8.6 mg/dL — ABNORMAL LOW (ref 8.9–10.3)
Chloride: 97 mmol/L — ABNORMAL LOW (ref 101–111)
GFR calc Af Amer: >60 mL/min (ref >60)
GFR, EST NON AFRICAN AMERICAN: 56 mL/min — AB (ref >60)
Glucose, Bld: 89 mg/dL (ref 65–99)
Potassium: 3.5 mmol/L (ref 3.5–5.1)
SODIUM: 138 mmol/L (ref 135–145)

## 2016-08-03 NOTE — ED Notes (Signed)
Patient left for chest x-ray. 

## 2016-08-03 NOTE — ED Provider Notes (Signed)
[facility] Emergency Department Provider Note   ____________________________________________   I have reviewed the triage vital signs and the nursing notes.   HISTORY  Chief Complaint Cough   History limited by: Not Limited, some history obtained from sister   HPI Anita BubaMartha Kirk is a 83 y.o. female who presents to the emergency department today from living facility because of concerns for cough. This cough has been present for the past 2 weeks. It is nonproductive. Patient denies any pain with the cough. The sister thinks that the patient was a little weaker today. Was sent to the emergency department for further eval.   Past Medical History:  Diagnosis Date  . Anxiety disorder   . Arthritis   . Chronic systolic CHF (congestive heart failure) (HCC)    a. echo 07/2014: EF 45-50%, dilated LA at 4.5 cm, mildly dilated RA, mod-sev MR, mod TR, mildly elevated PASP @ 45.2  . Cognitive impairment   . Depression   . Gait instability   . HTN (hypertension)   . Hyperlipidemia   . Panic attacks   . Parkinson's disease (HCC)   . Persistent atrial fibrillation (HCC)    a. Eliquis 5 mg bid; b. CHADS2VASc = 5 (CHF, HTN, age x 2, female)    Patient Active Problem List   Diagnosis Date Noted  . Altered mental state 09/18/2015  . UTI (lower urinary tract infection) 09/18/2015  . Pressure ulcer 09/18/2015  . Acute suppurative parotitis   . Atrial fibrillation with RVR (HCC)   . Hypokalemia   . Sepsis (HCC)   . Morbid obesity due to excess calories (HCC)   . Submandibular gland infection 08/13/2015  . Bilateral leg edema 10/24/2014  . Chronic atrial fibrillation (HCC) 08/23/2014  . Chronic systolic CHF (congestive heart failure) (HCC)   . Persistent atrial fibrillation (HCC)   . HTN (hypertension)   . Cognitive impairment   . Gait instability   . Depression   . Panic attacks     Past Surgical History:  Procedure Laterality Date  . EYE SURGERY       Prior to Admission medications   Medication Sig Start Date End Date Taking? Authorizing Provider  acetaminophen (TYLENOL) 325 MG tablet Take 650 mg by mouth every 6 (six) hours as needed for mild pain, moderate pain or fever.    Historical Provider, MD  apixaban (ELIQUIS) 5 MG TABS tablet Take 1 tablet (5 mg total) by mouth 2 (two) times daily. 08/16/15   Adrian SaranSital Mody, MD  fexofenadine (ALLEGRA) 180 MG tablet Take 180 mg by mouth daily.    Historical Provider, MD  furosemide (LASIX) 40 MG tablet Take 40 mg by mouth daily.    Historical Provider, MD  guaiFENesin-dextromethorphan (ROBITUSSIN DM) 100-10 MG/5ML syrup Take 10 mLs by mouth every 6 (six) hours as needed for cough.    Historical Provider, MD  ipratropium-albuterol (DUONEB) 0.5-2.5 (3) MG/3ML SOLN Take 3 mLs by nebulization 2 (two) times daily. 12/17/15   Emily FilbertJonathan E Williams, MD  levothyroxine (SYNTHROID, LEVOTHROID) 100 MCG tablet Take 50 mcg by mouth daily before breakfast.    Historical Provider, MD  metoprolol (LOPRESSOR) 50 MG tablet Take 50 mg by mouth 2 (two) times daily.  07/18/14   Historical Provider, MD  Multiple Vitamin (MULTIVITAMIN) tablet Take 1 tablet by mouth daily.    Historical Provider, MD  PARoxetine (PAXIL) 20 MG tablet Take 20 mg by mouth every morning.     Historical Provider, MD  potassium chloride SA (  K-DUR,KLOR-CON) 20 MEQ tablet Take 40 mEq by mouth 2 (two) times daily.     Historical Provider, MD    Allergies Aliskiren-hydrochlorothiazide; Amlodipine; and Sulfa antibiotics  Family History  Problem Relation Age of Onset  . Heart disease Sister     Social History Social History  Substance Use Topics  . Smoking status: Never Smoker  . Smokeless tobacco: Never Used  . Alcohol use No    Review of Systems  Constitutional: Negative for fever. Cardiovascular: Negative for chest pain. Respiratory: Positive for cough. Gastrointestinal: Negative for abdominal pain, vomiting and diarrhea. Neurological:  Negative for headaches, focal weakness or numbness.  10-point ROS otherwise negative.  ____________________________________________   PHYSICAL EXAM:  VITAL SIGNS: ED Triage Vitals [08/03/16 1726]  Enc Vitals Group     BP 124/81     Pulse Rate 74     Resp 20     Temp 97.6 F (36.4 C)     Temp src      SpO2 98 %     Weight 157 lb (71.2 kg)     Height 5\' 4"  (1.626 m)   Constitutional: Alert and oriented. Well appearing and in no distress. Eyes: Conjunctivae are normal. Normal extraocular movements. ENT   Head: Normocephalic and atraumatic.   Nose: No congestion/rhinnorhea.   Mouth/Throat: Mucous membranes are moist.   Neck: No stridor. Hematological/Lymphatic/Immunilogical: No cervical lymphadenopathy. Cardiovascular: Normal rate, regular rhythm.  No murmurs, rubs, or gallops.  Respiratory: Normal respiratory effort without tachypnea nor retractions. Breath sounds are clear and equal bilaterally. No wheezes/rales/rhonchi. Gastrointestinal: Soft and non tender. No rebound. No guarding.  Genitourinary: Deferred Musculoskeletal: Normal range of motion in all extremities. No lower extremity edema. Neurologic:  Normal speech and language. No gross focal neurologic deficits are appreciated.  Skin:  Skin is warm, dry and intact. No rash noted.   ____________________________________________    LABS (pertinent positives/negatives)  Labs Reviewed  CBC WITH DIFFERENTIAL/PLATELET - Abnormal; Notable for the following:       Result Value   RBC 3.52 (*)    Hemoglobin 11.9 (*)    MCV 101.5 (*)    All other components within normal limits  BASIC METABOLIC PANEL - Abnormal; Notable for the following:    Chloride 97 (*)    CO2 34 (*)    BUN 26 (*)    Calcium 8.6 (*)    GFR calc non Af Amer 56 (*)    All other components within normal limits  URINALYSIS, COMPLETE (UACMP) WITH MICROSCOPIC - Abnormal; Notable for the following:    Color, Urine YELLOW (*)    APPearance  CLEAR (*)    Leukocytes, UA TRACE (*)    Bacteria, UA RARE (*)    Squamous Epithelial / LPF 0-5 (*)    All other components within normal limits     ____________________________________________   EKG  None  ____________________________________________    RADIOLOGY  CXR IMPRESSION:  1. No evidence for acute cardiopulmonary abnormality.  2. Right shoulder subluxation, likely chronic.     ____________________________________________   PROCEDURES  Procedures  ____________________________________________   INITIAL IMPRESSION / ASSESSMENT AND PLAN / ED COURSE  Pertinent labs & imaging results that were available during my care of the patient were reviewed by me and considered in my medical decision making (see chart for details).  Patient presented to the emergency department with cough for 2 weeks. Workup here without concerning findings. Will discharge back to living facility.   ____________________________________________   FINAL  CLINICAL IMPRESSION(S) / ED DIAGNOSES  Final diagnoses:  Cough     Note: This dictation was prepared with Dragon dictation. Any transcriptional errors that result from this process are unintentional ,   Phineas Semen, MD 08/03/16 2102

## 2016-08-03 NOTE — Discharge Instructions (Signed)
Please seek medical attention for any high fevers, chest pain, shortness of breath, change in behavior, persistent vomiting, bloody stool or any other new or concerning symptoms.  

## 2016-08-03 NOTE — ED Notes (Signed)
Waiting for ambulance transport. 

## 2016-08-03 NOTE — ED Triage Notes (Signed)
BIB EMS from [facility] for non productive cough for past 2 weeks. Sister reports increased "rattling sound in chest" Non febrile VSS stable en route and upon arrival Hx of CHF>

## 2016-08-03 NOTE — ED Notes (Signed)
[facility] was called to transport patient back to [facility] @ 2116.

## 2016-09-18 DIAGNOSIS — F0150 Vascular dementia without behavioral disturbance: Secondary | ICD-10-CM

## 2016-09-18 DIAGNOSIS — F39 Unspecified mood [affective] disorder: Secondary | ICD-10-CM

## 2016-09-18 DIAGNOSIS — R1310 Dysphagia, unspecified: Secondary | ICD-10-CM | POA: Diagnosis not present

## 2016-09-18 DIAGNOSIS — I4891 Unspecified atrial fibrillation: Secondary | ICD-10-CM | POA: Diagnosis not present

## 2016-09-18 DIAGNOSIS — I5020 Unspecified systolic (congestive) heart failure: Secondary | ICD-10-CM

## 2016-09-18 DIAGNOSIS — E039 Hypothyroidism, unspecified: Secondary | ICD-10-CM | POA: Diagnosis not present

## 2016-09-22 ENCOUNTER — Emergency Department: Payer: Medicare Other

## 2016-09-22 ENCOUNTER — Inpatient Hospital Stay
Admission: EM | Admit: 2016-09-22 | Discharge: 2016-09-26 | DRG: 193 | Disposition: A | Discharge status: Skilled Nursing Facility | Payer: Medicare Other | Attending: Internal Medicine | Admitting: Internal Medicine

## 2016-09-22 DIAGNOSIS — G20 Parkinson's disease: Secondary | ICD-10-CM | POA: Diagnosis present

## 2016-09-22 DIAGNOSIS — Z79899 Other long term (current) drug therapy: Secondary | ICD-10-CM

## 2016-09-22 DIAGNOSIS — N390 Urinary tract infection, site not specified: Secondary | ICD-10-CM | POA: Diagnosis present

## 2016-09-22 DIAGNOSIS — J9601 Acute respiratory failure with hypoxia: Secondary | ICD-10-CM | POA: Diagnosis present

## 2016-09-22 DIAGNOSIS — L89620 Pressure ulcer of left heel, unstageable: Secondary | ICD-10-CM | POA: Diagnosis present

## 2016-09-22 DIAGNOSIS — J189 Pneumonia, unspecified organism: Secondary | ICD-10-CM | POA: Diagnosis present

## 2016-09-22 DIAGNOSIS — E785 Hyperlipidemia, unspecified: Secondary | ICD-10-CM | POA: Diagnosis present

## 2016-09-22 DIAGNOSIS — Z7901 Long term (current) use of anticoagulants: Secondary | ICD-10-CM | POA: Diagnosis not present

## 2016-09-22 DIAGNOSIS — R0602 Shortness of breath: Secondary | ICD-10-CM | POA: Diagnosis present

## 2016-09-22 DIAGNOSIS — Y95 Nosocomial condition: Secondary | ICD-10-CM | POA: Diagnosis present

## 2016-09-22 DIAGNOSIS — Z66 Do not resuscitate: Secondary | ICD-10-CM | POA: Diagnosis present

## 2016-09-22 DIAGNOSIS — B9689 Other specified bacterial agents as the cause of diseases classified elsewhere: Secondary | ICD-10-CM | POA: Diagnosis present

## 2016-09-22 DIAGNOSIS — J9602 Acute respiratory failure with hypercapnia: Secondary | ICD-10-CM | POA: Diagnosis present

## 2016-09-22 DIAGNOSIS — Z993 Dependence on wheelchair: Secondary | ICD-10-CM | POA: Diagnosis not present

## 2016-09-22 DIAGNOSIS — F329 Major depressive disorder, single episode, unspecified: Secondary | ICD-10-CM | POA: Diagnosis present

## 2016-09-22 DIAGNOSIS — I482 Chronic atrial fibrillation: Secondary | ICD-10-CM | POA: Diagnosis present

## 2016-09-22 DIAGNOSIS — I110 Hypertensive heart disease with heart failure: Secondary | ICD-10-CM | POA: Diagnosis present

## 2016-09-22 DIAGNOSIS — L8990 Pressure ulcer of unspecified site, unspecified stage: Secondary | ICD-10-CM | POA: Insufficient documentation

## 2016-09-22 DIAGNOSIS — G9341 Metabolic encephalopathy: Secondary | ICD-10-CM | POA: Diagnosis present

## 2016-09-22 DIAGNOSIS — I481 Persistent atrial fibrillation: Secondary | ICD-10-CM | POA: Diagnosis present

## 2016-09-22 DIAGNOSIS — I5022 Chronic systolic (congestive) heart failure: Secondary | ICD-10-CM | POA: Diagnosis present

## 2016-09-22 LAB — BLOOD GAS, VENOUS
ACID-BASE EXCESS: 5.2 mmol/L — AB (ref 0.0–2.0)
Bicarbonate: 34.3 mmol/L — ABNORMAL HIGH (ref 20.0–28.0)
Delivery systems: POSITIVE
FIO2: 0.28
O2 SAT: 50.3 %
PCO2 VEN: 73 mmHg — AB (ref 44.0–60.0)
PH VEN: 7.28 (ref 7.250–7.430)
Patient temperature: 37

## 2016-09-22 LAB — URINALYSIS, COMPLETE (UACMP) WITH MICROSCOPIC
BILIRUBIN URINE: NEGATIVE
Glucose, UA: NEGATIVE mg/dL
Hgb urine dipstick: NEGATIVE
Ketones, ur: NEGATIVE mg/dL
Nitrite: NEGATIVE
PH: 5 (ref 5.0–8.0)
Protein, ur: NEGATIVE mg/dL
SPECIFIC GRAVITY, URINE: 1.012 (ref 1.005–1.030)
SQUAMOUS EPITHELIAL / LPF: NONE SEEN

## 2016-09-22 LAB — CBC WITH DIFFERENTIAL/PLATELET
Basophils Absolute: 0 10*3/uL (ref 0–0.1)
Basophils Relative: 1 %
EOS PCT: 3 %
Eosinophils Absolute: 0.2 10*3/uL (ref 0–0.7)
HCT: 36.1 % (ref 35.0–47.0)
Hemoglobin: 12.2 g/dL (ref 12.0–16.0)
LYMPHS ABS: 1 10*3/uL (ref 1.0–3.6)
Lymphocytes Relative: 14 %
MCH: 34.5 pg — AB (ref 26.0–34.0)
MCHC: 33.7 g/dL (ref 32.0–36.0)
MCV: 102.1 fL — ABNORMAL HIGH (ref 80.0–100.0)
MONOS PCT: 8 %
Monocytes Absolute: 0.6 10*3/uL (ref 0.2–0.9)
Neutro Abs: 5.7 10*3/uL (ref 1.4–6.5)
Neutrophils Relative %: 74 %
PLATELETS: 340 10*3/uL (ref 150–440)
RBC: 3.54 MIL/uL — ABNORMAL LOW (ref 3.80–5.20)
RDW: 13.8 % (ref 11.5–14.5)
WBC: 7.6 10*3/uL (ref 3.6–11.0)

## 2016-09-22 LAB — COMPREHENSIVE METABOLIC PANEL
ALT: 20 U/L (ref 14–54)
AST: 29 U/L (ref 15–41)
Albumin: 3.4 g/dL — ABNORMAL LOW (ref 3.5–5.0)
Alkaline Phosphatase: 73 U/L (ref 38–126)
Anion gap: 7 (ref 5–15)
BILIRUBIN TOTAL: 0.6 mg/dL (ref 0.3–1.2)
BUN: 31 mg/dL — AB (ref 6–20)
CALCIUM: 8.8 mg/dL — AB (ref 8.9–10.3)
CO2: 32 mmol/L (ref 22–32)
CREATININE: 0.75 mg/dL (ref 0.44–1.00)
Chloride: 101 mmol/L (ref 101–111)
GFR calc Af Amer: >60 mL/min (ref >60)
Glucose, Bld: 164 mg/dL — ABNORMAL HIGH (ref 65–99)
Potassium: 3.9 mmol/L (ref 3.5–5.1)
Sodium: 140 mmol/L (ref 135–145)
TOTAL PROTEIN: 7.4 g/dL (ref 6.5–8.1)

## 2016-09-22 LAB — TROPONIN I: Troponin I: <0.03 ng/mL (ref <0.03)

## 2016-09-22 LAB — BRAIN NATRIURETIC PEPTIDE: B Natriuretic Peptide: 159 pg/mL — ABNORMAL HIGH (ref 0.0–100.0)

## 2016-09-22 LAB — LACTIC ACID, PLASMA: Lactic Acid, Venous: 1.7 mmol/L (ref 0.5–1.9)

## 2016-09-22 LAB — MRSA PCR SCREENING: MRSA by PCR: POSITIVE — AB

## 2016-09-22 LAB — FIBRIN DERIVATIVES D-DIMER (ARMC ONLY): FIBRIN DERIVATIVES D-DIMER (ARMC): 526.67 — AB (ref 0.00–499.00)

## 2016-09-22 MED ORDER — IPRATROPIUM-ALBUTEROL 0.5-2.5 (3) MG/3ML IN SOLN
RESPIRATORY_TRACT | Status: AC
  Administered 2016-09-22: 3 mL via RESPIRATORY_TRACT
  Filled 2016-09-22: qty 3

## 2016-09-22 MED ORDER — CEFEPIME-DEXTROSE 1 GM/50ML IV SOLR
1.0000 g | INTRAVENOUS | Status: DC
  Administered 2016-09-22: 1 g via INTRAVENOUS
  Filled 2016-09-22: qty 50

## 2016-09-22 MED ORDER — POTASSIUM CHLORIDE CRYS ER 20 MEQ PO TBCR
40.0000 meq | EXTENDED_RELEASE_TABLET | Freq: Two times a day (BID) | ORAL | Status: DC
  Administered 2016-09-22 – 2016-09-26 (×8): 40 meq via ORAL
  Filled 2016-09-22 (×8): qty 2

## 2016-09-22 MED ORDER — LORATADINE 10 MG PO TABS
10.0000 mg | ORAL_TABLET | Freq: Every day | ORAL | Status: DC
Start: 2016-09-23 — End: 2016-09-26
  Administered 2016-09-23 – 2016-09-26 (×4): 10 mg via ORAL
  Filled 2016-09-22 (×4): qty 1

## 2016-09-22 MED ORDER — SODIUM CHLORIDE 0.9% FLUSH: 3.0000 mL | INTRAVENOUS | Status: DC | PRN

## 2016-09-22 MED ORDER — IPRATROPIUM-ALBUTEROL 0.5-2.5 (3) MG/3ML IN SOLN
3.0000 mL | Freq: Once | RESPIRATORY_TRACT | Status: AC
  Administered 2016-09-22: 3 mL via RESPIRATORY_TRACT

## 2016-09-22 MED ORDER — IPRATROPIUM-ALBUTEROL 0.5-2.5 (3) MG/3ML IN SOLN
3.0000 mL | Freq: Two times a day (BID) | RESPIRATORY_TRACT | Status: DC
  Administered 2016-09-22: 3 mL via RESPIRATORY_TRACT
  Filled 2016-09-22: qty 3

## 2016-09-22 MED ORDER — TRAMADOL HCL 50 MG PO TABS
50.0000 mg | ORAL_TABLET | Freq: Four times a day (QID) | ORAL | Status: DC | PRN
  Administered 2016-09-22: 50 mg via ORAL
  Filled 2016-09-22: qty 1

## 2016-09-22 MED ORDER — SODIUM CHLORIDE 0.9% FLUSH
3.0000 mL | Freq: Two times a day (BID) | INTRAVENOUS | Status: DC
  Administered 2016-09-22 – 2016-09-25 (×6): 3 mL via INTRAVENOUS

## 2016-09-22 MED ORDER — CEFEPIME-DEXTROSE 1 GM/50ML IV SOLR
1.0000 g | Freq: Two times a day (BID) | INTRAVENOUS | Status: DC
  Administered 2016-09-23 – 2016-09-26 (×7): 1 g via INTRAVENOUS
  Filled 2016-09-22 (×10): qty 50

## 2016-09-22 MED ORDER — IPRATROPIUM-ALBUTEROL 0.5-2.5 (3) MG/3ML IN SOLN
3.0000 mL | Freq: Two times a day (BID) | RESPIRATORY_TRACT | Status: DC
  Administered 2016-09-23 – 2016-09-26 (×7): 3 mL via RESPIRATORY_TRACT
  Filled 2016-09-22 (×7): qty 3

## 2016-09-22 MED ORDER — FERROUS SULFATE 325 (65 FE) MG PO TABS
325.0000 mg | ORAL_TABLET | Freq: Every day | ORAL | Status: DC
  Administered 2016-09-23 – 2016-09-26 (×4): 325 mg via ORAL
  Filled 2016-09-22 (×4): qty 1

## 2016-09-22 MED ORDER — METOPROLOL TARTRATE 50 MG PO TABS
50.0000 mg | ORAL_TABLET | Freq: Two times a day (BID) | ORAL | Status: DC
  Administered 2016-09-22 – 2016-09-26 (×8): 50 mg via ORAL
  Filled 2016-09-22 (×8): qty 1

## 2016-09-22 MED ORDER — SENNOSIDES-DOCUSATE SODIUM 8.6-50 MG PO TABS: 1.0000 | ORAL_TABLET | Freq: Every evening | ORAL | Status: DC | PRN

## 2016-09-22 MED ORDER — VANCOMYCIN HCL IN DEXTROSE 1-5 GM/200ML-% IV SOLN
1000.0000 mg | INTRAVENOUS | Status: AC
  Administered 2016-09-22: 1000 mg via INTRAVENOUS
  Filled 2016-09-22: qty 200

## 2016-09-22 MED ORDER — FUROSEMIDE 20 MG PO TABS
20.0000 mg | ORAL_TABLET | Freq: Every day | ORAL | Status: DC
  Administered 2016-09-22 – 2016-09-26 (×5): 20 mg via ORAL
  Filled 2016-09-22 (×3): qty 1
  Filled 2016-09-22: qty 2
  Filled 2016-09-22: qty 1

## 2016-09-22 MED ORDER — LEVOTHYROXINE SODIUM 50 MCG PO TABS
50.0000 ug | ORAL_TABLET | Freq: Every evening | ORAL | Status: DC
  Administered 2016-09-22 – 2016-09-25 (×4): 50 ug via ORAL
  Filled 2016-09-22 (×4): qty 1

## 2016-09-22 MED ORDER — IOPAMIDOL (ISOVUE-370) INJECTION 76%
75.0000 mL | Freq: Once | INTRAVENOUS | Status: AC | PRN
  Administered 2016-09-22: 75 mL via INTRAVENOUS

## 2016-09-22 MED ORDER — ARGINAID EXTRA PO LIQD: 1.0000 | Freq: Two times a day (BID) | ORAL | Status: DC

## 2016-09-22 MED ORDER — ONDANSETRON HCL 4 MG/2ML IJ SOLN: 4.0000 mg | Freq: Four times a day (QID) | INTRAMUSCULAR | Status: DC | PRN

## 2016-09-22 MED ORDER — PIPERACILLIN-TAZOBACTAM 3.375 G IVPB 30 MIN
3.3750 g | Freq: Once | INTRAVENOUS | Status: AC
  Administered 2016-09-22: 3.375 g via INTRAVENOUS
  Filled 2016-09-22: qty 50

## 2016-09-22 MED ORDER — VANCOMYCIN HCL IN DEXTROSE 1-5 GM/200ML-% IV SOLN
1000.0000 mg | INTRAVENOUS | Status: DC
  Administered 2016-09-23 (×2): 1000 mg via INTRAVENOUS
  Filled 2016-09-22 (×5): qty 200

## 2016-09-22 MED ORDER — CEFEPIME-DEXTROSE 1 GM/50ML IV SOLR: 1.0000 g | INTRAVENOUS | Status: DC

## 2016-09-22 MED ORDER — ACETAMINOPHEN 325 MG PO TABS
650.0000 mg | ORAL_TABLET | ORAL | Status: DC | PRN
  Administered 2016-09-23 – 2016-09-24 (×2): 650 mg via ORAL
  Filled 2016-09-22 (×2): qty 2

## 2016-09-22 MED ORDER — APIXABAN 5 MG PO TABS
5.0000 mg | ORAL_TABLET | Freq: Two times a day (BID) | ORAL | Status: DC
  Administered 2016-09-22 – 2016-09-26 (×8): 5 mg via ORAL
  Filled 2016-09-22 (×8): qty 1

## 2016-09-22 MED ORDER — PAROXETINE HCL 20 MG PO TABS
20.0000 mg | ORAL_TABLET | ORAL | Status: DC
  Administered 2016-09-24 – 2016-09-26 (×2): 20 mg via ORAL
  Filled 2016-09-22 (×2): qty 1

## 2016-09-22 MED ORDER — SODIUM CHLORIDE 0.9 % IV SOLN
250.0000 mL | INTRAVENOUS | Status: DC | PRN
Start: 2016-09-22 — End: 2016-09-26

## 2016-09-22 NOTE — Progress Notes (Signed)
PHARMACIST - PHYSICIAN ORDER COMMUNICATION  CONCERNING: P&T Medication Policy on Herbal Medications  DESCRIPTION:  This patient's order for:  ARGINAID EXTRA LIQD 1 Dose has been noted.  This product(s) is classified as an "herbal" or natural product. Due to a lack of definitive safety studies or FDA approval, nonstandard manufacturing practices, plus the potential risk of unknown drug-drug interactions while on inpatient medications, the Pharmacy and Therapeutics Committee does not permit the use of "herbal" or natural products of this type within [facility].   ACTION TAKEN: The pharmacy department is unable to verify this order at this time and your patient has been informed of this safety policy. Please reevaluate patient's clinical condition at discharge and address if the herbal or natural product(s) should be resumed at that time.

## 2016-09-22 NOTE — Progress Notes (Signed)
Pharmacy Antibiotic Note  [name] is a 83 y.o. female admitted on 09/22/2016 with  pneumonia.  Pharmacy has been consulted for vancomycin dosing. Patient is also receiving cefepime.   Plan: Ke: 0.045   T1/2: 15.4   Vd: 50.4   Will start patient on Vancomycin 1gm IV every 18 hours with 6 hour stack dosing. Calculated trough at Css is 17. Trough ordered prior to 4th dose. Will monitor renal function and adjust dose as needed.    Height: 5\' 4"  (162.6 cm) Weight: 159 lb 9.8 oz (72.4 kg) IBW/kg (Calculated) : 54.7  Temp (24hrs), Avg:97.7 F (36.5 C), Min:97.5 F (36.4 C), Max:97.8 F (36.6 C)   Recent Labs Lab 09/22/16 1422  WBC 7.6  CREATININE 0.75  LATICACIDVEN 1.7    Estimated Creatinine Clearance: 49.2 mL/min (by C-G formula based on SCr of 0.75 mg/dL).    Allergies  Allergen Reactions  . Aliskiren-Hydrochlorothiazide Other (See Comments)    Reaction: unknown  . Amlodipine Other (See Comments)    Reaction: unknown  . Sulfa Antibiotics Swelling    Antimicrobials this admission: 3/25 cefepime >>  3/25 vancomycin >>  Dose adjustments this admission:   Microbiology results: 3/25  BCx: sent 3/25 Sputum: sent 3/25 MRSA PCR: positive   Thank you for allowing pharmacy to be a part of this patient's care.  Gardner CandleSheema M Mikhaila Roh, PharmD, BCPS Clinical Pharmacist 09/22/2016 8:58 PM

## 2016-09-22 NOTE — ED Triage Notes (Signed)
Pt came to ED via EMS [facility]. History of CHF, nurse che ked on pt and oxygen saturation was low, 80s on 6 L per EMS. Pt takes 40mg  of lasix daily. Blood pressure 141/90, HR 93. Afebrile.

## 2016-09-22 NOTE — ED Notes (Signed)
Placed xeroform and gauze over left foot ulcer.

## 2016-09-22 NOTE — Progress Notes (Signed)
Pt removed from [facility] for [place] trial. Pt placed on 3L [place], SPO2 100%.

## 2016-09-22 NOTE — H&P (Signed)
[facility] [place] [facility]   PATIENT NAME: [name]    MR#:  [id number]  DATE OF BIRTH:  03/05/1929  DATE OF ADMISSION:  09/22/2016  PRIMARY CARE PHYSICIAN: Tillman Abide, MD   REQUESTING/REFERRING PHYSICIAN: Arnaldo Natal, MD  CHIEF COMPLAINT:   Chief Complaint  Patient presents with  . Shortness of Breath   Shortness of breath and confusion today. HISTORY OF PRESENT ILLNESS:  [name]  is a 83 y.o. female with a known history of Hypertension, hyperlipidemia, Parkinson's disease, chronic systolic CHF and persistent A. fib. The patient was sent from skilled nursing facility due to above chief complaint. According to the patient's sister, the patient is flying into yesterday. She found the patient was sick and weak. The patient was found shortness of breath and confused since this morning. She's hypoxia at 82%. Her PCO2 is 73 in the ED. She is put on BiPAP. CTof the chest show multifocal pneumonia.  PAST MEDICAL HISTORY:   Past Medical History:  Diagnosis Date  . Anxiety disorder   . Arthritis   . Chronic systolic CHF (congestive heart failure) (HCC)    a. echo 07/2014: EF 45-50%, dilated LA at 4.5 cm, mildly dilated RA, mod-sev MR, mod TR, mildly elevated PASP @ 45.2  . Cognitive impairment   . Depression   . Gait instability   . HTN (hypertension)   . Hyperlipidemia   . Panic attacks   . Parkinson's disease (HCC)   . Persistent atrial fibrillation (HCC)    a. Eliquis 5 mg bid; b. CHADS2VASc = 5 (CHF, HTN, age x 2, female)    PAST SURGICAL HISTORY:   Past Surgical History:  Procedure Laterality Date  . EYE SURGERY      SOCIAL HISTORY:   Social History  Substance Use Topics  . Smoking status: Never Smoker  . Smokeless tobacco: Never Used  . Alcohol use No    FAMILY HISTORY:   Family History  Problem Relation Age of Onset  . Heart disease Sister     DRUG ALLERGIES:   Allergies  Allergen Reactions  .  Aliskiren-Hydrochlorothiazide Other (See Comments)    Reaction: unknown  . Amlodipine Other (See Comments)    Reaction: unknown  . Sulfa Antibiotics Swelling    REVIEW OF SYSTEMS:   Review of Systems  Unable to perform ROS: Mental status change    MEDICATIONS AT HOME:   Prior to Admission medications   Medication Sig Start Date End Date Taking? Authorizing Provider  acetaminophen (TYLENOL) 325 MG tablet Take 650 mg by mouth every 4 (four) hours as needed for mild pain, moderate pain or fever.    Yes Historical Provider, MD  apixaban (ELIQUIS) 5 MG TABS tablet Take 1 tablet (5 mg total) by mouth 2 (two) times daily. 08/16/15  Yes Adrian Saran, MD  ferrous sulfate 325 (65 FE) MG EC tablet Take 325 mg by mouth daily.   Yes Historical Provider, MD  fexofenadine (ALLEGRA) 180 MG tablet Take 180 mg by mouth daily.   Yes Historical Provider, MD  furosemide (LASIX) 20 MG tablet Take 20 mg by mouth daily. At 1pm   Yes Historical Provider, MD  furosemide (LASIX) 40 MG tablet Take 40 mg by mouth daily.   Yes Historical Provider, MD  ipratropium-albuterol (DUONEB) 0.5-2.5 (3) MG/3ML SOLN Take 3 mLs by nebulization 2 (two) times daily. 12/17/15  Yes Emily Filbert, MD  levothyroxine (SYNTHROID, LEVOTHROID) 100 MCG tablet Take 50 mcg by  mouth every evening.    Yes Historical Provider, MD  metoprolol (LOPRESSOR) 50 MG tablet Take 50 mg by mouth 2 (two) times daily.  07/18/14  Yes Historical Provider, MD  Multiple Vitamin (MULTIVITAMIN) tablet Take 1 tablet by mouth daily.   Yes Historical Provider, MD  Nutritional Supplements (ARGINAID EXTRA) LIQD Take 1 Dose by mouth 2 (two) times daily.   Yes Historical Provider, MD  PARoxetine (PAXIL) 10 MG tablet Take 10 mg by mouth See admin instructions. Every mon, wed, fri   Yes Historical Provider, MD  PARoxetine (PAXIL) 20 MG tablet Take 20 mg by mouth See admin instructions. Every tues, thurs, sat, sun   Yes Historical Provider, MD  potassium chloride SA  (K-DUR,KLOR-CON) 20 MEQ tablet Take 40 mEq by mouth 2 (two) times daily.    Yes Historical Provider, MD      VITAL SIGNS:  Blood pressure 111/67, pulse 88, temperature 97.5 F (36.4 C), temperature source Axillary, resp. rate 14, weight 157 lb (71.2 kg), SpO2 100 %.  PHYSICAL EXAMINATION:  Physical Exam  GENERAL:  83 y.o.-year-old patient lying in the bed, on BIPAP. EYES: Pupils equal, round, reactive to light and accommodation. No scleral icterus. Extraocular muscles intact.  HEENT: Head atraumatic, normocephalic. Oropharynx and nasopharynx clear.  NECK:  Supple, no jugular venous distention. No thyroid enlargement, no tenderness.  LUNGS: Diminished breath sounds bilaterally, no wheezing, but has crackles. No use of accessory muscles of respiration.  CARDIOVASCULAR: S1, S2 normal. No murmurs, rubs, or gallops.  ABDOMEN: Soft, nontender, nondistended. Bowel sounds present. No organomegaly or mass.  EXTREMITIES: No pedal edema, cyanosis, or clubbing.  NEUROLOGIC: Opened eyes on stimuli, unable to exam. Right arm tremor PSYCHIATRIC: The patient is confused, only opened eyes on stimuli. SKIN: No obvious rash, lesion, or ulcer.   LABORATORY PANEL:   CBC  Recent Labs Lab 09/22/16 1422  WBC 7.6  HGB 12.2  HCT 36.1  PLT 340   ------------------------------------------------------------------------------------------------------------------  Chemistries   Recent Labs Lab 09/22/16 1422  NA 140  K 3.9  CL 101  CO2 32  GLUCOSE 164*  BUN 31*  CREATININE 0.75  CALCIUM 8.8*  AST 29  ALT 20  ALKPHOS 73  BILITOT 0.6   ------------------------------------------------------------------------------------------------------------------  Cardiac Enzymes  Recent Labs Lab 09/22/16 1422  TROPONINI <0.03   ------------------------------------------------------------------------------------------------------------------  RADIOLOGY:  Ct Angio Chest Pe W And/or Wo  Contrast  Result Date: 09/22/2016 CLINICAL DATA:  Hypoxia, cough, and wheezing.  Elevated D-dimer. EXAM: CT ANGIOGRAPHY CHEST WITH CONTRAST TECHNIQUE: Multidetector CT imaging of the chest was performed using the standard protocol during bolus administration of intravenous contrast. Multiplanar CT image reconstructions and MIPs were obtained to evaluate the vascular anatomy. CONTRAST:  75 mL of Isovue 370 COMPARISON:  Chest x-ray from earlier today and chest CT June 07, 2015 FINDINGS: Cardiovascular: The heart size is unchanged. Atherosclerotic changes are seen in the thoracic aorta with no dissection or aneurysm. No pulmonary emboli. Mediastinum/Nodes: No enlarged mediastinal, hilar, or axillary lymph nodes. Thyroid gland, trachea, and esophagus demonstrate no significant findings. Lungs/Pleura: There is mucus in the trachea. No pneumothorax. Mixed attenuation in the lungs demonstrates a component of air trapping. There also scattered ground-glass and tree-in-bud opacities in the lungs consistent with an infectious component. For example, these opacities are seen in the left lower lobe on series 6, image 45 and in the right lower lobe on series 6, image 45. Evaluation for pulmonary nodules is limited but no suspicious nodules are identified. Upper Abdomen:  No acute abnormality. Musculoskeletal: No chest wall abnormality. No acute or significant osseous findings. Review of the MIP images confirms the above findings. IMPRESSION: 1. No pulmonary emboli. 2. Scattered pulmonary opacities consistent with mild multifocal pneumonia. 3. No other acute abnormalities. Electronically Signed   By: Gerome Sam III M.D   On: 09/22/2016 16:37   Dg Chest Portable 1 View  Result Date: 09/22/2016 CLINICAL DATA:  Shortness of breath, history of congestive heart failure EXAM: PORTABLE CHEST 1 VIEW COMPARISON:  08/03/2016 FINDINGS: Cardiomediastinal silhouette is stable. No infiltrate or pulmonary edema. Stable left basilar  atelectasis or scarring. Chronic right shoulder subluxation again noted. IMPRESSION: No active disease. Electronically Signed   By: Natasha Mead M.D.   On: 09/22/2016 14:47      IMPRESSION AND PLAN:   Acute respiratory failure with Hypercapnia due to multifocal pneumonia (HAP) The patient will be admitted to stepdown unit. Continue BiPAP, nebulizer, start cefepime and vancomycin, follow-up CBC and cultures.   Acute metabolic encephalopathy. Due to above. Aspiration and fall precaution.  Hypertension. Continue home hypertension medication.  Chronic Systolic CHF. Continue Lasix. Stable.  Chronic persistent A. fib. Rate controlled, continue Lopressor and Eliquis.  Parkinson's disease.   All the records are reviewed and case discussed with ED provider. Management plans discussed with the patient's sister (POA) and they are in agreement.  CODE STATUS: DO NOT RESUSCITATE.  TOTAL CRITICAL TIME TAKING CARE OF THIS PATIENT: 58 minutes.    Shaune Pollack M.D on 09/22/2016 at 5:37 PM  Between 7am to 6pm - Pager - [phone]  After 6pm go to [email or web address] - Social research officer, government  [facility]  Office  [phone]  CC: Primary care physician; Tillman Abide, MD   Note: This dictation was prepared with Dragon dictation along with smaller phrase technology. Any transcriptional errors that result from this process are unintentional.

## 2016-09-22 NOTE — Progress Notes (Signed)
Assisted with patient transport to CT scan. Patient transferred on V60 BiPap to CT scan and back to ER room 19 with no complications noted.

## 2016-09-22 NOTE — ED Provider Notes (Addendum)
[facility] Emergency Department Provider Note   ____________________________________________   First MD Initiated Contact with Patient 09/22/16 1421     (approximate)  I have reviewed the triage vital signs and the nursing notes.   HISTORY  Chief Complaint Shortness of Breath  Patient not speaking makes it hard to get history history obtained from EMS  HPI [name] is a 83 y.o. female patient comes from [facility] with history of onset of shortness of breath and low O2 sats as low as 82. She takes takes Lasix and got 80 mg of Lasix today rash or wounds good patient put on BiPAP and PCO2 went up   Past Medical History:  Diagnosis Date  . Anxiety disorder   . Arthritis   . Chronic systolic CHF (congestive heart failure) (HCC)    a. echo 07/2014: EF 45-50%, dilated LA at 4.5 cm, mildly dilated RA, mod-sev MR, mod TR, mildly elevated PASP @ 45.2  . Cognitive impairment   . Depression   . Gait instability   . HTN (hypertension)   . Hyperlipidemia   . Panic attacks   . Parkinson's disease (HCC)   . Persistent atrial fibrillation (HCC)    a. Eliquis 5 mg bid; b. CHADS2VASc = 5 (CHF, HTN, age x 2, female)    Patient Active Problem List   Diagnosis Date Noted  . Altered mental state 09/18/2015  . UTI (lower urinary tract infection) 09/18/2015  . Pressure ulcer 09/18/2015  . Acute suppurative parotitis   . Atrial fibrillation with RVR (HCC)   . Hypokalemia   . Sepsis (HCC)   . Morbid obesity due to excess calories (HCC)   . Submandibular gland infection 08/13/2015  . Bilateral leg edema 10/24/2014  . Chronic atrial fibrillation (HCC) 08/23/2014  . Chronic systolic CHF (congestive heart failure) (HCC)   . Persistent atrial fibrillation (HCC)   . HTN (hypertension)   . Cognitive impairment   . Gait instability   . Depression   . Panic attacks     Past Surgical History:  Procedure Laterality Date  . EYE SURGERY      Prior to Admission  medications   Medication Sig Start Date End Date Taking? Authorizing Provider  acetaminophen (TYLENOL) 325 MG tablet Take 650 mg by mouth every 4 (four) hours as needed for mild pain, moderate pain or fever.    Yes Historical Provider, MD  apixaban (ELIQUIS) 5 MG TABS tablet Take 1 tablet (5 mg total) by mouth 2 (two) times daily. 08/16/15  Yes Adrian Saran, MD  ferrous sulfate 325 (65 FE) MG EC tablet Take 325 mg by mouth daily.   Yes Historical Provider, MD  fexofenadine (ALLEGRA) 180 MG tablet Take 180 mg by mouth daily.   Yes Historical Provider, MD  furosemide (LASIX) 20 MG tablet Take 20 mg by mouth daily. At 1pm   Yes Historical Provider, MD  furosemide (LASIX) 40 MG tablet Take 40 mg by mouth daily.   Yes Historical Provider, MD  ipratropium-albuterol (DUONEB) 0.5-2.5 (3) MG/3ML SOLN Take 3 mLs by nebulization 2 (two) times daily. 12/17/15  Yes Emily Filbert, MD  levothyroxine (SYNTHROID, LEVOTHROID) 100 MCG tablet Take 50 mcg by mouth every evening.    Yes Historical Provider, MD  metoprolol (LOPRESSOR) 50 MG tablet Take 50 mg by mouth 2 (two) times daily.  07/18/14  Yes Historical Provider, MD  Multiple Vitamin (MULTIVITAMIN) tablet Take 1 tablet by mouth daily.   Yes Historical Provider, MD  Nutritional Supplements (ARGINAID EXTRA) LIQD Take 1 Dose by mouth 2 (two) times daily.   Yes Historical Provider, MD  PARoxetine (PAXIL) 10 MG tablet Take 10 mg by mouth See admin instructions. Every mon, wed, fri   Yes Historical Provider, MD  PARoxetine (PAXIL) 20 MG tablet Take 20 mg by mouth See admin instructions. Every tues, thurs, sat, sun   Yes Historical Provider, MD  potassium chloride SA (K-DUR,KLOR-CON) 20 MEQ tablet Take 40 mEq by mouth 2 (two) times daily.    Yes Historical Provider, MD    Allergies Aliskiren-hydrochlorothiazide; Amlodipine; and Sulfa antibiotics  Family History  Problem Relation Age of Onset  . Heart disease Sister     Social History Social History    Substance Use Topics  . Smoking status: Never Smoker  . Smokeless tobacco: Never Used  . Alcohol use No    Review of Systems  Unable to obtain ____________________________________________   PHYSICAL EXAM:  VITAL SIGNS: ED Triage Vitals  Enc Vitals Group     BP 09/22/16 1423 (!) 141/90     Pulse Rate 09/22/16 1418 88     Resp 09/22/16 1418 (!) 28     Temp 09/22/16 1423 97.5 F (36.4 C)     Temp Source 09/22/16 1423 Axillary     SpO2 09/22/16 1418 99 %     Weight 09/22/16 1424 157 lb (71.2 kg)     Height --      Head Circumference --      Peak Flow --      Pain Score --      Pain Loc --      Pain Edu? --      Excl. in GC? --     Constitutional: Alert  And looks short of breath Eyes: Conjunctivae are normal. PERRL. EOMI. Head: Atraumatic. Nose: No congestion/rhinnorhea. Mouth/Throat: Mucous membranes are moist.  Oropharynx non-erythematous. Neck: No stridor.  Cardiovascular: Normal rate, regular rhythm. Grossly normal heart sounds.  Good peripheral circulation. Respiratory: Normal respiratory effort.  No retractions. Lungs CTAB. Gastrointestinal: Soft and nontender. No distention. No abdominal bruits. No CVA tenderness. Musculoskeletal: No lower extremity tenderness trace edema edema.  No joint effusions. Skin:  Skin is warm, dry and intact. No rash noted.   ____________________________________________   LABS (all labs ordered are listed, but only abnormal results are displayed)  Labs Reviewed  COMPREHENSIVE METABOLIC PANEL - Abnormal; Notable for the following:       Result Value   Glucose, Bld 164 (*)    BUN 31 (*)    Calcium 8.8 (*)    Albumin 3.4 (*)    All other components within normal limits  BRAIN NATRIURETIC PEPTIDE - Abnormal; Notable for the following:    B Natriuretic Peptide 159.0 (*)    All other components within normal limits  CBC WITH DIFFERENTIAL/PLATELET - Abnormal; Notable for the following:    RBC 3.54 (*)    MCV 102.1 (*)    MCH  34.5 (*)    All other components within normal limits  FIBRIN DERIVATIVES D-DIMER ([facility] ONLY) - Abnormal; Notable for the following:    Fibrin derivatives D-dimer ([facility]) 526.67 (*)    All other components within normal limits  URINALYSIS, COMPLETE (UACMP) WITH MICROSCOPIC - Abnormal; Notable for the following:    Color, Urine YELLOW (*)    APPearance HAZY (*)    Leukocytes, UA TRACE (*)    Bacteria, UA FEW (*)    All other components within normal limits  BLOOD GAS, VENOUS - Abnormal; Notable for the following:    pCO2, Ven 73 (*)    Bicarbonate 34.3 (*)    Acid-Base Excess 5.2 (*)    All other components within normal limits  TROPONIN I  LACTIC ACID, PLASMA  LACTIC ACID, PLASMA   ____________________________________________  EKG  EKG read and interpreted by me shows atrial fibrillation at a rate of 96, normal axis nonspecific ST-T wave changes poor baseline ____________________________________________  RADIOLOGY  Study Result   CLINICAL DATA:  Shortness of breath, history of congestive heart failure  EXAM: PORTABLE CHEST 1 VIEW  COMPARISON:  08/03/2016  FINDINGS: Cardiomediastinal silhouette is stable. No infiltrate or pulmonary edema. Stable left basilar atelectasis or scarring. Chronic right shoulder subluxation again noted.  IMPRESSION: No active disease.   Electronically Signed   By: Natasha Mead M.D.   On: 09/22/2016 14:47    Study Result   CLINICAL DATA:  Hypoxia, cough, and wheezing.  Elevated D-dimer.  EXAM: CT ANGIOGRAPHY CHEST WITH CONTRAST  TECHNIQUE: Multidetector CT imaging of the chest was performed using the standard protocol during bolus administration of intravenous contrast. Multiplanar CT image reconstructions and MIPs were obtained to evaluate the vascular anatomy.  CONTRAST:  75 mL of Isovue 370  COMPARISON:  Chest x-ray from earlier today and chest CT June 07, 2015  FINDINGS: Cardiovascular: The heart size is  unchanged. Atherosclerotic changes are seen in the thoracic aorta with no dissection or aneurysm. No pulmonary emboli.  Mediastinum/Nodes: No enlarged mediastinal, hilar, or axillary lymph nodes. Thyroid gland, trachea, and esophagus demonstrate no significant findings.  Lungs/Pleura: There is mucus in the trachea. No pneumothorax. Mixed attenuation in the lungs demonstrates a component of air trapping. There also scattered ground-glass and tree-in-bud opacities in the lungs consistent with an infectious component. For example, these opacities are seen in the left lower lobe on series 6, image 45 and in the right lower lobe on series 6, image 45. Evaluation for pulmonary nodules is limited but no suspicious nodules are identified.  Upper Abdomen: No acute abnormality.  Musculoskeletal: No chest wall abnormality. No acute or significant osseous findings.  Review of the MIP images confirms the above findings.  IMPRESSION: 1. No pulmonary emboli. 2. Scattered pulmonary opacities consistent with mild multifocal pneumonia. 3. No other acute abnormalities.   Electronically Signed   By: Gerome Sam III M.D   On: 09/22/2016 16:37     ____________________________________________   PROCEDURES  Procedure(s) performed:  Procedures    ____________________________________________   INITIAL IMPRESSION / ASSESSMENT AND PLAN / ED COURSE  Pertinent labs & imaging results that were available during my care of the patient were reviewed by me and considered in my medical decision making (see chart for details).        ____________________________________________   FINAL CLINICAL IMPRESSION(S) / ED DIAGNOSES  Final diagnoses:  HCAP (healthcare-associated pneumonia)      NEW MEDICATIONS STARTED DURING THIS VISIT:  New Prescriptions   No medications on file     Note:  This document was prepared using Dragon voice recognition software and may include  unintentional dictation errors.    Arnaldo Natal, MD 09/22/16 1707    Arnaldo Natal, MD 09/22/16 [phone]

## 2016-09-23 ENCOUNTER — Telehealth: Payer: Self-pay | Admitting: Cardiovascular Disease

## 2016-09-23 DIAGNOSIS — L8990 Pressure ulcer of unspecified site, unspecified stage: Secondary | ICD-10-CM | POA: Insufficient documentation

## 2016-09-23 DIAGNOSIS — J9602 Acute respiratory failure with hypercapnia: Secondary | ICD-10-CM

## 2016-09-23 DIAGNOSIS — I5022 Chronic systolic (congestive) heart failure: Secondary | ICD-10-CM

## 2016-09-23 DIAGNOSIS — I481 Persistent atrial fibrillation: Secondary | ICD-10-CM

## 2016-09-23 LAB — GLUCOSE, CAPILLARY: GLUCOSE-CAPILLARY: 105 mg/dL — AB (ref 65–99)

## 2016-09-23 LAB — STREP PNEUMONIAE URINARY ANTIGEN: STREP PNEUMO URINARY ANTIGEN: NEGATIVE

## 2016-09-23 NOTE — Progress Notes (Signed)
Pt admitted to room 159, skin check done with Raynelle FanningJulie, RN , pt placed on telemetry. Bed in the lowest position, call bell within reach and bed alarm on.

## 2016-09-23 NOTE — Progress Notes (Signed)
Pharmacy Antibiotic Note  Anita BubaMartha Kirk is a 83 y.o. female admitted on 09/22/2016 with  pneumonia.  Pharmacy has been consulted for vancomycin dosing. Patient is also receiving cefepime. MRSA PCR is positive.   Plan: Continue vancomycin 1000 mg iv q 18 hours. Trough scheduled with the 4th dose. Goal trough 15-20 mcg/ml.    Height: 5\' 4"  (162.6 cm) Weight: 159 lb 9.8 oz (72.4 kg) IBW/kg (Calculated) : 54.7  Temp (24hrs), Avg:97.9 F (36.6 C), Min:97.5 F (36.4 C), Max:98.3 F (36.8 C)   Recent Labs Lab 09/22/16 1422  WBC 7.6  CREATININE 0.75  LATICACIDVEN 1.7    Estimated Creatinine Clearance: 49.2 mL/min (by C-G formula based on SCr of 0.75 mg/dL).    Allergies  Allergen Reactions  . Aliskiren-Hydrochlorothiazide Other (See Comments)    Reaction: unknown  . Amlodipine Other (See Comments)    Reaction: unknown  . Sulfa Antibiotics Swelling    Antimicrobials this admission: 3/25 cefepime >>  3/25 vancomycin >>  Dose adjustments this admission:   Microbiology results: 3/25  BCx: NGTD 3/25 Sputum: sent 3/25 MRSA PCR: positive  UCx: sent  Thank you for allowing pharmacy to be a part of this patient's care.  Valentina Guhristy, Olan Kurek D, PharmD, BCPS Clinical Pharmacist 09/23/2016 11:34 AM

## 2016-09-23 NOTE — Telephone Encounter (Signed)
Patient sister came to office to let [name] know she is currently admitted and will not make her appt .  She wants to ask prime doc for a consult for gollan to come see her .

## 2016-09-23 NOTE — Care Management Note (Signed)
Case Management Note  Patient Details  Name: [name] MRN: [id number] Date of Birth: Nov 25, 1928  Subjective/Objective:                  Met with patient's sister [name] whom states she hold POA but "not healthcare". She states she assumed this after her parents died. She states that patient is from SNF at [facility] but not on O2 there at baseline.   Action/Plan:  Referral to CSW made.   Expected Discharge Date:                  Expected Discharge Plan:     In-House Referral:  Clinical Social Work  Discharge planning Services     Post Acute Care Choice:    Choice offered to:  Sibling  DME Arranged:    DME Agency:     HH Arranged:    [place] Agency:     Status of Service:  Completed, signed off  If discussed at [employer], dates discussed:    Additional Comments:  Marshell Garfinkel, RN 09/23/2016, 10:49 AM

## 2016-09-23 NOTE — Progress Notes (Signed)
Report called to Tobi Bastosnna pt transferring to room 59 non tele. Sister at bedside.

## 2016-09-23 NOTE — Consult Note (Signed)
Cardiology Consultation Note  Patient ID: [name], MRN: [id number], DOB/AGE: 1929/04/09 83 y.o. Admit date: 09/22/2016   Date of Consult: 09/23/2016 Primary Physician: Tillman Abide, MD Primary Cardiologist: Dr. Mariah Milling, MD Requesting Physician: Dr. Imogene Burn, MD  Chief Complaint: Shortness of breath Reason for Consult: Persistent A. fib with RVR/chronic systolic CHF  HPI: 83 y.o. female with h/o chronic atrial fibrillation on Eliquis, chronic systolic CHF, cognitive impairment, hypertension, gait instability now wheelchair-bound, Parkinson's disease, and anxiety/panic attack who presented to [facility] on 3/25 with increased shortness of breath was found to have multifocal pneumonia. Cardiology asked to evaluate patient by family given her prior cardiac history.  Patient was initially diagnosed with atrial fibrillation during hospital admission and 07/2014, located by acute systolic CHF. She was diuresed at that time. Echo during that admission showed EF 45-50%, dilated left atrium at 45 mm, dilated right atrium, moderate to severe mitral regurgitation, moderate tricuspid regurgitation, and mildly elevated right-sided pressure at 45.2 mmHg. Head CT during that admission showed chronic infarcts along the right frontal and temporal lobes with associated encephalomalacia. Given her CHADS2VASc of at least 6 she was started on Eliquis.  History taken from prior notes given mental impairment and no family available. Patient was brought in with increased SOB. No other details documented.   Upon the patient's arrival to [facility] they were found to have blood pressure of 141/90, heart rate 93 bpm, afebrile, oxygen saturation 99% on BiPAP. ECG as below, CXR showed no active disease. D-dimer mildly elevated at 526. CTA chest PE protocol was negative for PE though did show multifocal pneumonia. VBG showed pCO2 of 73. Blood cultures negative 2 at less than 12 hours. Strep pneumo antigen negative. Troponin negative 1.  Lactic acid 1.7. White blood cell count 7.6, hemoglobin 12.2, over-the-counter 140. BNP 159. Serum creatinine 0.75, potassium 3.9, sodium 140. He was started on vancomycin and Zosyn. Supportive care has been initiated with steroids and nebulizer therapy. Patient's sister asked the cardiology [facility] and on patient given her above cardiac history.  Past Medical History:  Diagnosis Date  . Anxiety disorder   . Arthritis   . Chronic systolic CHF (congestive heart failure) (HCC)    a. echo 07/2014: EF 45-50%, dilated LA at 4.5 cm, mildly dilated RA, mod-sev MR, mod TR, mildly elevated PASP @ 45.2  . Cognitive impairment   . Depression   . Gait instability   . HTN (hypertension)   . Hyperlipidemia   . Panic attacks   . Parkinson's disease (HCC)   . Persistent atrial fibrillation (HCC)    a. Eliquis 5 mg bid; b. CHADS2VASc = 5 (CHF, HTN, age x 2, female)      Most Recent Cardiac Studies: As above    Surgical History:  Past Surgical History:  Procedure Laterality Date  . EYE SURGERY       Home Meds: Prior to Admission medications   Medication Sig Start Date End Date Taking? Authorizing Provider  acetaminophen (TYLENOL) 325 MG tablet Take 650 mg by mouth every 4 (four) hours as needed for mild pain, moderate pain or fever.    Yes Historical Provider, MD  apixaban (ELIQUIS) 5 MG TABS tablet Take 1 tablet (5 mg total) by mouth 2 (two) times daily. 08/16/15  Yes Adrian Saran, MD  ferrous sulfate 325 (65 FE) MG EC tablet Take 325 mg by mouth daily.   Yes Historical Provider, MD  fexofenadine (ALLEGRA) 180 MG tablet Take 180 mg by mouth daily.   Yes Historical  Provider, MD  furosemide (LASIX) 20 MG tablet Take 20 mg by mouth daily. At 1pm   Yes Historical Provider, MD  furosemide (LASIX) 40 MG tablet Take 40 mg by mouth daily.   Yes Historical Provider, MD  ipratropium-albuterol (DUONEB) 0.5-2.5 (3) MG/3ML SOLN Take 3 mLs by nebulization 2 (two) times daily. 12/17/15  Yes Emily Filbert, MD    levothyroxine (SYNTHROID, LEVOTHROID) 100 MCG tablet Take 50 mcg by mouth every evening.    Yes Historical Provider, MD  metoprolol (LOPRESSOR) 50 MG tablet Take 50 mg by mouth 2 (two) times daily.  07/18/14  Yes Historical Provider, MD  Multiple Vitamin (MULTIVITAMIN) tablet Take 1 tablet by mouth daily.   Yes Historical Provider, MD  Nutritional Supplements (ARGINAID EXTRA) LIQD Take 1 Dose by mouth 2 (two) times daily.   Yes Historical Provider, MD  PARoxetine (PAXIL) 10 MG tablet Take 10 mg by mouth See admin instructions. Every mon, wed, fri   Yes Historical Provider, MD  PARoxetine (PAXIL) 20 MG tablet Take 20 mg by mouth See admin instructions. Every tues, thurs, sat, sun   Yes Historical Provider, MD  potassium chloride SA (K-DUR,KLOR-CON) 20 MEQ tablet Take 40 mEq by mouth 2 (two) times daily.    Yes Historical Provider, MD    Inpatient Medications:  . apixaban  5 mg Oral BID  . ceFEPIme  1 g Intravenous Q12H  . ferrous sulfate  325 mg Oral Daily  . furosemide  20 mg Oral Q1200  . ipratropium-albuterol  3 mL Nebulization BID  . levothyroxine  50 mcg Oral QPM  . loratadine  10 mg Oral Daily  . metoprolol  50 mg Oral BID  . [START ON 09/24/2016] PARoxetine  20 mg Oral Once per day on Sun Tue Thu Sat  . potassium chloride SA  40 mEq Oral BID  . sodium chloride flush  3 mL Intravenous Q12H  . vancomycin  1,000 mg Intravenous Q18H     Allergies:  Allergies  Allergen Reactions  . Aliskiren-Hydrochlorothiazide Other (See Comments)    Reaction: unknown  . Amlodipine Other (See Comments)    Reaction: unknown  . Sulfa Antibiotics Swelling    Social History   Social History  . Marital status: Single    Spouse name: N/A  . Number of children: N/A  . Years of education: N/A   Occupational History  . retired    Social History Main Topics  . Smoking status: Never Smoker  . Smokeless tobacco: Never Used  . Alcohol use No  . Drug use: No  . Sexual activity: Not on file    Other Topics Concern  . Not on file   Social History Narrative  . No narrative on file     Family History  Problem Relation Age of Onset  . Heart disease Sister      Review of Systems: Review of Systems  Unable to perform ROS: Mental acuity    Labs:  Recent Labs  09/22/16 1422  TROPONINI <0.03   Lab Results  Component Value Date   WBC 7.6 09/22/2016   HGB 12.2 09/22/2016   HCT 36.1 09/22/2016   MCV 102.1 (H) 09/22/2016   PLT 340 09/22/2016     Recent Labs Lab 09/22/16 1422  NA 140  K 3.9  CL 101  CO2 32  BUN 31*  CREATININE 0.75  CALCIUM 8.8*  PROT 7.4  BILITOT 0.6  ALKPHOS 73  ALT 20  AST 29  GLUCOSE 164*  No results found for: CHOL, HDL, LDLCALC, TRIG No results found for: DDIMER  Radiology/Studies:  Ct Angio Chest Pe W And/or Wo Contrast  Result Date: 09/22/2016 CLINICAL DATA:  Hypoxia, cough, and wheezing.  Elevated D-dimer. EXAM: CT ANGIOGRAPHY CHEST WITH CONTRAST TECHNIQUE: Multidetector CT imaging of the chest was performed using the standard protocol during bolus administration of intravenous contrast. Multiplanar CT image reconstructions and MIPs were obtained to evaluate the vascular anatomy. CONTRAST:  75 mL of Isovue 370 COMPARISON:  Chest x-ray from earlier today and chest CT June 07, 2015 FINDINGS: Cardiovascular: The heart size is unchanged. Atherosclerotic changes are seen in the thoracic aorta with no dissection or aneurysm. No pulmonary emboli. Mediastinum/Nodes: No enlarged mediastinal, hilar, or axillary lymph nodes. Thyroid gland, trachea, and esophagus demonstrate no significant findings. Lungs/Pleura: There is mucus in the trachea. No pneumothorax. Mixed attenuation in the lungs demonstrates a component of air trapping. There also scattered ground-glass and tree-in-bud opacities in the lungs consistent with an infectious component. For example, these opacities are seen in the left lower lobe on series 6, image 45 and in the right  lower lobe on series 6, image 45. Evaluation for pulmonary nodules is limited but no suspicious nodules are identified. Upper Abdomen: No acute abnormality. Musculoskeletal: No chest wall abnormality. No acute or significant osseous findings. Review of the MIP images confirms the above findings. IMPRESSION: 1. No pulmonary emboli. 2. Scattered pulmonary opacities consistent with mild multifocal pneumonia. 3. No other acute abnormalities. Electronically Signed   By: Gerome Sam III M.D   On: 09/22/2016 16:37   Dg Chest Portable 1 View  Result Date: 09/22/2016 CLINICAL DATA:  Shortness of breath, history of congestive heart failure EXAM: PORTABLE CHEST 1 VIEW COMPARISON:  08/03/2016 FINDINGS: Cardiomediastinal silhouette is stable. No infiltrate or pulmonary edema. Stable left basilar atelectasis or scarring. Chronic right shoulder subluxation again noted. IMPRESSION: No active disease. Electronically Signed   By: Natasha Mead M.D.   On: 09/22/2016 14:47    EKG: Interpreted by me showed: Afib, 96 bpm, nonspecific ST-T changes Telemetry: Interpreted by me showed: Afib, currently rate controlled with heart rates in the 70s bpm. Upon admission A. fib with RVR with heart rates into the 140s to 160s bpm.  Weights: Filed Weights   09/22/16 1424 09/22/16 1826  Weight: 157 lb (71.2 kg) 159 lb 9.8 oz (72.4 kg)     Physical Exam: Blood pressure (!) 105/53, pulse 80, temperature 98.3 F (36.8 C), temperature source Oral, resp. rate (!) 24, height 5\' 4"  (1.626 m), weight 159 lb 9.8 oz (72.4 kg), SpO2 100 %. Body mass index is 27.4 kg/m. General: Frail appearing, in no acute distress. Head: Normocephalic, atraumatic, sclera non-icteric, no xanthomas, nares are without discharge.  Neck: Negative for carotid bruits. JVD not elevated. Lungs: Diminished breath sounds with diffuse rhonchi. Breathing is unlabored. On nasal cannula Heart: Irregularly irregular with S1 S2. II/VI systolic murmurs, no rubs, or  gallops appreciated. Abdomen: Soft, non-tender, non-distended with normoactive bowel sounds. No hepatomegaly. No rebound/guarding. No obvious abdominal masses. Msk:  Strength and tone appear normal for age. Extremities: No clubbing or cyanosis. No edema. Distal pedal pulses are 2+ and equal bilaterally. Neuro: Alert. No facial asymmetry. No focal deficit. Moves all extremities spontaneously. Psych:  Minimally conversive.    Assessment and Plan:  Principal Problem:   Acute respiratory failure with hypercapnia (HCC) Active Problems:   Multifocal pneumonia   Chronic systolic CHF (congestive heart failure) (HCC)   Persistent atrial  fibrillation (HCC)   Pressure injury of skin    1. Acute respiratory failure with hypoxia and hypercapnia: -Likely in the setting of the patient's multifocal pneumonia -Currently on nasal cannula, wean as tolerated per primary service -If pCO2 levels increase, would transition to BiPAP -She does not appear grossly volume overloaded at this time, thus suspect less likely acute on chronic systolic CHF  2. Chronic systolic CHF: -Does not appear grossly volume overloaded at this time -Continue by mouth Lasix with close monitoring of renal function given infection and episodes of hypotension -Continue metoprolol  3. Persistent atrial fibrillation: -Upon admission was noted be in A. fib with RVR with heart rates in the 140s to 160s bpm likely in the setting of patient's multifocal pneumonia -Currently rate controlled with heart rates in the 70s bpm -Continue rate control with metoprolol as above -Continue Eliquis for full dose long-term anticoagulation -CHADS2VASc at least 6  4. Multifocal pneumonia: -On vancomycin and Zosyn per IM -Supportive care with steroids and nebulizer therapy per IM -If patient redevelops A. fib with RVR consider changing albuterol to Xopenex   Signed, Eula Listenyan Liadan Guizar, PA-C [facility] Pager: [phone] 09/23/2016, 12:29 PM

## 2016-09-23 NOTE — Consult Note (Signed)
WOC Nurse wound consult note Reason for Consult:unstageable pressure injury to left heel.  Present on admission.   Wound type:unstageable pressure injury Pressure Injury POA: Yes Measurement: 1 cm x 0.5 cm intact eschar.  Calloused periwound Wound WUJ:[id number]:devitalized tissue, dry and intact Drainage (amount, consistency, odor) none Periwound:calloused circumferentially Dressing procedure/placement/frequency:Silicone border foam dressing and heel protectors Will not follow at this time.  Please re-consult if needed.  Maple HudsonKaren Marni Franzoni RN BSN [facility]ON Pager [phone]

## 2016-09-23 NOTE — Evaluation (Signed)
Clinical/Bedside Swallow Evaluation Patient Details  Name: Anita BubaMartha Kupper MRN: [id number] Date of Birth: 04/21/1929  Today's Date: 09/23/2016 Time: SLP Start Time (ACUTE ONLY): 1335 SLP Stop Time (ACUTE ONLY): 1435 SLP Time Calculation (min) (ACUTE ONLY): 60 min  Past Medical History:  Past Medical History:  Diagnosis Date  . Anxiety disorder   . Arthritis   . Chronic systolic CHF (congestive heart failure) (HCC)    a. echo 07/2014: EF 45-50%, dilated LA at 4.5 cm, mildly dilated RA, mod-sev MR, mod TR, mildly elevated PASP @ 45.2  . Cognitive impairment   . Depression   . Gait instability   . HTN (hypertension)   . Hyperlipidemia   . Panic attacks   . Parkinson's disease (HCC)   . Persistent atrial fibrillation (HCC)    a. Eliquis 5 mg bid; b. CHADS2VASc = 5 (CHF, HTN, age x 2, female)   Past Surgical History:  Past Surgical History:  Procedure Laterality Date  . EYE SURGERY     HPI:  Pt is a 83 y.o. female with a known history of Hypertension, hyperlipidemia, Parkinson's disease w/ Cognitive decline, chronic systolic CHF and persistent A. fib. The patient was sent from skilled nursing facility due to feeling sick and weakness. The patient was found shortness of breath and confused since this morning. She's hypoxia at 82%. Her PCO2 is 73 in the ED. She is put on BiPAP. CTof the chest show multifocal pneumonia. Pt has been on a dysphagia diet w/ recent facility utilizing Nectar consistency liquids. Pt did have a MBSS in 2/17 which did not indicate any aspiration w/ thin liquids though a delayed pharyngeal swallow initiation was noted increasing risk for aspiration. Pt is also edentulous and better mashes pureed foods per Sister. Sister indicated pt has been tolerating this diet "well" for several months now. Currently, pt is drowsy.   Assessment / Plan / Recommendation Clinical Impression  Pt appears to present w/ increased risk for aspiration secondary to overall drowsiness which could  impact her baseline oropharyngeal phase dysphagia evidenced by MBSS ~1 year ago. Although no aspiration was noted w/ thin liquids, pt demonstrated a delayed pharyngeal swallow initiation w/ all liquids. Current drowsiness could impact the pharyngeal swallow even more thus a Nectar consistency liquid is recommended at this time. Pt has been tolerating po trials of puree w/ NSG and Sister and prior to admission w/ no deficits. Pt does have declined Cognitive status and Parkinson's Disease as well as lacks dentition at baseline which can impact oropharyngeal swallow function. Due to this presentation, recommend a Dysphagia level 1 w/ Nectar consistency liquids; strict aspiration precautions and hold any po's if too drowsy; Pills given in Puree; monitoring w/ all oral intake.  SLP Visit Diagnosis: Dysphagia, oropharyngeal phase (R13.12)    Aspiration Risk  Mild aspiration risk (-Moderate aspiration risk)    Diet Recommendation  Dysphagia level 1 w/ Nectar liquids; strict aspiration precautions; feeding support and monitoring w/ all oral intake.  Medication Administration: Whole meds with puree (but give Crushed if needed for easier swallowing; as able)    Other  Recommendations Recommended Consults:  (Dietician f/u) Oral Care Recommendations: Oral care BID;Staff/trained caregiver to provide oral care Other Recommendations: Order thickener from pharmacy;Prohibited food (jello, ice cream, thin soups);Remove water pitcher;Have oral suction available   Follow up Recommendations Skilled Nursing facility (TBD)      Frequency and Duration min 3x week  2 weeks       Prognosis Prognosis for Safe Diet Advancement:  Fair Barriers to Reach Goals: Cognitive deficits;Severity of deficits      Swallow Study   General Date of Onset: 09/22/16 HPI: Pt is a 83 y.o. female with a known history of Hypertension, hyperlipidemia, Parkinson's disease w/ Cognitive decline, chronic systolic CHF and persistent A. fib.  The patient was sent from skilled nursing facility due to feeling sick and weakness. The patient was found shortness of breath and confused since this morning. She's hypoxia at 82%. Her PCO2 is 73 in the ED. She is put on BiPAP. CTof the chest show multifocal pneumonia. Pt has been on a dysphagia diet w/ recent facility utilizing Nectar consistency liquids. Pt did have a MBSS in 2/17 which did not indicate any aspiration w/ thin liquids though a delayed pharyngeal swallow initiation was noted increasing risk for aspiration. Pt is also edentulous and better mashes pureed foods per Sister. Sister indicated pt has been tolerating this diet "well" for several months now. Currently, pt is drowsy. Type of Study: Bedside Swallow Evaluation Previous Swallow Assessment: MBSS in 08/2015; followed by ST services [facility] briefly per Sister. Diet Prior to this Study: Dysphagia 1 (puree);Nectar-thick liquids Temperature Spikes Noted: No (wbc not elevated) Respiratory Status: Nasal cannula (1-2 liters) History of Recent Intubation: No (use of BiPAP at admission) Behavior/Cognition: Lethargic/Drowsy;Distractible;Requires cueing;Confused Oral Cavity Assessment: Within Functional Limits Oral Care Completed by SLP: Recent completion by staff Oral Cavity - Dentition: Edentulous Vision:  (n/a) Self-Feeding Abilities: Total assist Patient Positioning: Upright in bed Baseline Vocal Quality: Low vocal intensity (mumbled speech) Volitional Cough: Cognitively unable to elicit Volitional Swallow: Unable to elicit    Oral/Motor/Sensory Function Overall Oral Motor/Sensory Function:  (unable to fully assess d/t drowsiness)   Ice Chips Ice chips: Not tested   Thin Liquid Thin Liquid: Not tested    Nectar Thick Nectar Thick Liquid: Impaired Presentation: Spoon (fed; 6 trials) Oral Phase Impairments: Reduced labial seal;Poor awareness of bolus (drowsy) Oral phase functional implications: Prolonged oral transit (slight  leakage on last bolus trial) Pharyngeal Phase Impairments:  (none) Other Comments: pt became too drowsy for po intake to continue safely   Honey Thick Honey Thick Liquid: Not tested   Puree Puree: Impaired Presentation: Spoon (fed; 1 trial) Oral Phase Impairments: Reduced labial seal;Poor awareness of bolus (drowsy) Oral Phase Functional Implications: Prolonged oral transit Pharyngeal Phase Impairments:  (none)   Solid   GO   Solid: Not tested         Jerilynn Som, MS, CCC-SLP Watson,Katherine 09/23/2016,3:21 PM

## 2016-09-23 NOTE — Progress Notes (Signed)
[facility] [place] [facility]   PATIENT NAME: [name]    MR#:  [id number]  DATE OF BIRTH:  1929-06-01  SUBJECTIVE:  CHIEF COMPLAINT:   Chief Complaint  Patient presents with  . Shortness of Breath     Came with hypoxia, required Bipap, now on nasal canula oxygen. Sister in room. Pt is somewhat confused, but denies any complains.  REVIEW OF SYSTEMS:  CONSTITUTIONAL: No fever, fatigue or weakness.  EYES: No blurred or double vision.  EARS, NOSE, AND THROAT: No tinnitus or ear pain.  RESPIRATORY: No cough,positive for shortness of breath,no wheezing or hemoptysis.  CARDIOVASCULAR: No chest pain, orthopnea, edema.  GASTROINTESTINAL: No nausea, vomiting, diarrhea or abdominal pain.  GENITOURINARY: No dysuria, hematuria.  ENDOCRINE: No polyuria, nocturia,  HEMATOLOGY: No anemia, easy bruising or bleeding SKIN: No rash or lesion. MUSCULOSKELETAL: No joint pain or arthritis.   NEUROLOGIC: No tingling, numbness, weakness.  PSYCHIATRY: No anxiety or depression.   ROS  DRUG ALLERGIES:   Allergies  Allergen Reactions  . Aliskiren-Hydrochlorothiazide Other (See Comments)    Reaction: unknown  . Amlodipine Other (See Comments)    Reaction: unknown  . Sulfa Antibiotics Swelling    VITALS:  Blood pressure (!) 139/59, pulse 91, temperature 98.4 F (36.9 C), temperature source Oral, resp. rate 18, height 5\' 4"  (1.626 m), weight 72.4 kg (159 lb 9.8 oz), SpO2 97 %.  PHYSICAL EXAMINATION:  GENERAL:  83 y.o.-year-old patient lying in the bed with no acute distress.  EYES: Pupils equal, round, reactive to light and accommodation. No scleral icterus. Extraocular muscles intact.  HEENT: Head atraumatic, normocephalic. Oropharynx and nasopharynx clear.  NECK:  Supple, no jugular venous distention. No thyroid enlargement, no tenderness.  LUNGS: Normal breath sounds bilaterally, no wheezing, bilateral crepitation. No use of accessory muscles of respiration.   CARDIOVASCULAR: S1, S2 normal. No murmurs, rubs, or gallops.  ABDOMEN: Soft, nontender, nondistended. Bowel sounds present. No organomegaly or mass.  EXTREMITIES: No pedal edema, cyanosis, or clubbing.  NEUROLOGIC: Cranial nerves II through XII are intact. Muscle strength 3/5 in upper extremities and 1/5 in lower extremities. Sensation intact. Gait not checked.  PSYCHIATRIC: The patient is alert and oriented x 1.  SKIN: No obvious rash, lesion, or ulcer.   Physical Exam LABORATORY PANEL:   CBC  Recent Labs Lab 09/22/16 1422  WBC 7.6  HGB 12.2  HCT 36.1  PLT 340   ------------------------------------------------------------------------------------------------------------------  Chemistries   Recent Labs Lab 09/22/16 1422  NA 140  K 3.9  CL 101  CO2 32  GLUCOSE 164*  BUN 31*  CREATININE 0.75  CALCIUM 8.8*  AST 29  ALT 20  ALKPHOS 73  BILITOT 0.6   ------------------------------------------------------------------------------------------------------------------  Cardiac Enzymes  Recent Labs Lab 09/22/16 1422  TROPONINI <0.03   ------------------------------------------------------------------------------------------------------------------  RADIOLOGY:  Ct Angio Chest Pe W And/or Wo Contrast  Result Date: 09/22/2016 CLINICAL DATA:  Hypoxia, cough, and wheezing.  Elevated D-dimer. EXAM: CT ANGIOGRAPHY CHEST WITH CONTRAST TECHNIQUE: Multidetector CT imaging of the chest was performed using the standard protocol during bolus administration of intravenous contrast. Multiplanar CT image reconstructions and MIPs were obtained to evaluate the vascular anatomy. CONTRAST:  75 mL of Isovue 370 COMPARISON:  Chest x-ray from earlier today and chest CT June 07, 2015 FINDINGS: Cardiovascular: The heart size is unchanged. Atherosclerotic changes are seen in the thoracic aorta with no dissection or aneurysm. No pulmonary emboli. Mediastinum/Nodes: No enlarged mediastinal,  hilar, or axillary lymph nodes. Thyroid gland, trachea,  and esophagus demonstrate no significant findings. Lungs/Pleura: There is mucus in the trachea. No pneumothorax. Mixed attenuation in the lungs demonstrates a component of air trapping. There also scattered ground-glass and tree-in-bud opacities in the lungs consistent with an infectious component. For example, these opacities are seen in the left lower lobe on series 6, image 45 and in the right lower lobe on series 6, image 45. Evaluation for pulmonary nodules is limited but no suspicious nodules are identified. Upper Abdomen: No acute abnormality. Musculoskeletal: No chest wall abnormality. No acute or significant osseous findings. Review of the MIP images confirms the above findings. IMPRESSION: 1. No pulmonary emboli. 2. Scattered pulmonary opacities consistent with mild multifocal pneumonia. 3. No other acute abnormalities. Electronically Signed   By: Gerome Samavid  Williams III M.D   On: 09/22/2016 16:37   Dg Chest Portable 1 View  Result Date: 09/22/2016 CLINICAL DATA:  Shortness of breath, history of congestive heart failure EXAM: PORTABLE CHEST 1 VIEW COMPARISON:  08/03/2016 FINDINGS: Cardiomediastinal silhouette is stable. No infiltrate or pulmonary edema. Stable left basilar atelectasis or scarring. Chronic right shoulder subluxation again noted. IMPRESSION: No active disease. Electronically Signed   By: Natasha MeadLiviu  Pop M.D.   On: 09/22/2016 14:47    ASSESSMENT AND PLAN:   Principal Problem:   Acute respiratory failure with hypercapnia (HCC) Active Problems:   Chronic systolic CHF (congestive heart failure) (HCC)   Persistent atrial fibrillation (HCC)   Multifocal pneumonia   Pressure injury of skin  * Acute respiratory failure with Hypercapnia due to multifocal pneumonia (HAP) Required BiPAP, nebulizer, on cefepime and vancomycin, follow-up CBC and cultures. Improved, now on nasal canula oxygen.   *Acute metabolic encephalopathy. Due to  above. Aspiration and fall precaution.   Now more alert, SLP eval.  *Hypertension. Continue home hypertension medication.  *Chronic Systolic CHF. Continue Lasix. Stable.  *Chronic persistent A. fib. Rate controlled, continue Lopressor and Eliquis.  *Parkinson's disease.    All the records are reviewed and case discussed with Care Management/Social Workerr. Management plans discussed with the patient, family and they are in agreement.  CODE STATUS: DNR  TOTAL TIME TAKING CARE OF THIS PATIENT: 35 minutes.   Discussed with her sister in room.  POSSIBLE D/C IN 1-2 DAYS, DEPENDING ON CLINICAL CONDITION.   Altamese DillingVACHHANI, Wilmont Olund M.D on 09/23/2016   Between 7am to 6pm - Pager - [phone]  After 6pm go to [email or web address] - password [email or web address]  [facility] [place] [facility]  Office  [phone]  CC: Primary care physician; Tillman Abideichard Letvak, MD  Note: This dictation was prepared with Dragon dictation along with smaller phrase technology. Any transcriptional errors that result from this process are unintentional.

## 2016-09-24 ENCOUNTER — Telehealth: Payer: Self-pay

## 2016-09-24 LAB — VANCOMYCIN, TROUGH: VANCOMYCIN TR: 24 ug/mL — AB (ref 15–20)

## 2016-09-24 MED ORDER — VANCOMYCIN HCL IN DEXTROSE 1-5 GM/200ML-% IV SOLN
1000.0000 mg | INTRAVENOUS | Status: DC
  Administered 2016-09-24 – 2016-09-25 (×2): 1000 mg via INTRAVENOUS
  Filled 2016-09-24 (×4): qty 200

## 2016-09-24 MED ORDER — ENSURE ENLIVE PO LIQD
237.0000 mL | Freq: Two times a day (BID) | ORAL | Status: DC
  Administered 2016-09-24 – 2016-09-25 (×2): 237 mL via ORAL

## 2016-09-24 MED ORDER — CHLORHEXIDINE GLUCONATE CLOTH 2 % EX PADS
6.0000 | MEDICATED_PAD | Freq: Every day | CUTANEOUS | Status: DC
  Administered 2016-09-24 – 2016-09-26 (×3): 6 via TOPICAL

## 2016-09-24 MED ORDER — MUPIROCIN 2 % EX OINT
1.0000 | TOPICAL_OINTMENT | Freq: Two times a day (BID) | CUTANEOUS | Status: DC
Start: 2016-09-24 — End: 2016-09-26
  Administered 2016-09-24 – 2016-09-26 (×5): 1 via NASAL
  Filled 2016-09-24: qty 22

## 2016-09-24 NOTE — Progress Notes (Signed)
Nutrition Brief Note  Patient identified on the Malnutrition Screening Tool (MST) Report  Wt Readings from Last 15 Encounters:  09/22/16 159 lb 9.8 oz (72.4 kg)  08/03/16 157 lb (71.2 kg)  03/29/16 147 lb (66.7 kg)  12/27/15 153 lb (69.4 kg)  12/17/15 160 lb (72.6 kg)  10/02/15 151 lb 3.2 oz (68.6 kg)  09/18/15 153 lb (69.4 kg)  09/15/15 180 lb (81.6 kg)  09/01/15 153 lb (69.4 kg)  08/16/15 155 lb 8 oz (70.5 kg)  06/26/15 150 lb (68 kg)  06/07/15 165 lb (74.8 kg)  02/24/15 160 lb (72.6 kg)  10/24/14 160 lb (72.6 kg)  07/26/14 154 lb (69.9 kg)    Body mass index is 27.4 kg/m. Patient meets criteria for overweight based on current BMI.   Current diet order is NDD1-Nectar Thick with assist, patient is consuming approximately 50-100% of meals at this time. Labs and medications reviewed.   Attempted to speak with patient but unsure if she is able to provide accurate history. Claims she ate all of her breakfast this morning. Had Eggs, Oatmeal, Yogurt, Magic Cup, Milk and Orange Juice. Certainly needs assistance with eating - has L Sided weakness. No N/V/D/C Weight stable - claims a UBW of 140#  Will monitor PO intake - come back if family is there.  No nutrition interventions warranted at this time. If nutrition issues arise, please consult RD.   Dionne AnoWilliam M. Naviyah Schaffert, MS, RD LDN Inpatient Clinical Dietitian Pager [phone]

## 2016-09-24 NOTE — [place] FL2 (Signed)
[place] MEDICAID FL2 LEVEL OF CARE SCREENING TOOL     IDENTIFICATION  Patient Name: [name] Birthdate: Nov 12, 1928 Sex: female Admission Date (Current Location): 09/22/2016  [place]   Facility and Address:  [facility], [address], [place], [place] [address]      Provider Number: [id number]  Attending Physician Name and Address:  Altamese DillingVaibhavkumar Vachhani, MD  Relative Name and Phone Number:       Current Level of Care: Hospital Recommended Level of Care: Skilled Nursing Facility Prior Approval Number:    Date Approved/Denied:   PASRR Number:  [id number] [id number])  Discharge Plan: SNF    Current Diagnoses: Patient Active Problem List   Diagnosis Date Noted  . Pressure injury of skin 09/23/2016  . Acute respiratory failure with hypercapnia (HCC) 09/23/2016  . Multifocal pneumonia 09/22/2016  . Altered mental state 09/18/2015  . UTI (lower urinary tract infection) 09/18/2015  . Pressure ulcer 09/18/2015  . Acute suppurative parotitis   . Atrial fibrillation with RVR (HCC)   . Hypokalemia   . Sepsis (HCC)   . Morbid obesity due to excess calories (HCC)   . Submandibular gland infection 08/13/2015  . Bilateral leg edema 10/24/2014  . Chronic atrial fibrillation (HCC) 08/23/2014  . Chronic systolic CHF (congestive heart failure) (HCC)   . Persistent atrial fibrillation (HCC)   . HTN (hypertension)   . Cognitive impairment   . Gait instability   . Depression   . Panic attacks     Orientation RESPIRATION BLADDER Height & Weight     Self, Place, Situation  Normal Incontinent Weight: 159 lb 9.8 oz (72.4 kg) Height:  5\' 4"  (162.6 cm)  BEHAVIORAL SYMPTOMS/MOOD NEUROLOGICAL BOWEL NUTRITION STATUS   (None. )  (None. ) Continent Diet (Diet: DYS 1)  AMBULATORY STATUS COMMUNICATION OF NEEDS Skin   Extensive Assist Verbally PU Stage and Appropriate Care (Deep Tissue Injury: Left Foot)                        Personal Care Assistance Level of Assistance  Bathing, Feeding, Dressing Bathing Assistance: Limited assistance Feeding assistance: Independent Dressing Assistance: Limited assistance     Functional Limitations Info  Sight, Hearing, Speech Sight Info: Adequate Hearing Info: Adequate Speech Info: Adequate    SPECIAL CARE FACTORS FREQUENCY  PT (By licensed PT), OT (By licensed OT)                    Contractures      Additional Factors Info  Code Status, Allergies Code Status Info:  (DNR) Allergies Info:  ( Aliskiren-hydrochlorothiazide, Amlodipine, Sulfa Antibiotics)           Current Medications (09/24/2016):  This is the current hospital active medication list Current Facility-Administered Medications  Medication Dose Route Frequency Provider Last Rate Last Dose  . 0.9 %  sodium chloride infusion  250 mL Intravenous PRN Shaune PollackQing Chen, MD      . acetaminophen (TYLENOL) tablet 650 mg  650 mg Oral Q4H PRN Shaune PollackQing Chen, MD   650 mg at 09/23/16 2349  . apixaban (ELIQUIS) tablet 5 mg  5 mg Oral BID Shaune PollackQing Chen, MD   5 mg at 09/23/16 2134  . ceFEPIme (MAXIPIME) 1 GM / 50mL IVPB premix  1 g Intravenous Q12H Sheema M Hallaji, RPH   1 g at 09/23/16 2135  . ferrous sulfate tablet 325 mg  325 mg Oral Daily Shaune PollackQing Chen, MD  325 mg at 09/23/16 0939  . furosemide (LASIX) tablet 20 mg  20 mg Oral Q1200 Shaune Pollack, MD   20 mg at 09/23/16 1150  . ipratropium-albuterol (DUONEB) 0.5-2.5 (3) MG/3ML nebulizer solution 3 mL  3 mL Nebulization BID Shaune Pollack, MD   3 mL at 09/24/16 0805  . levothyroxine (SYNTHROID, LEVOTHROID) tablet 50 mcg  50 mcg Oral QPM Shaune Pollack, MD   50 mcg at 09/23/16 2134  . loratadine (CLARITIN) tablet 10 mg  10 mg Oral Daily Shaune Pollack, MD   10 mg at 09/23/16 0933  . metoprolol (LOPRESSOR) tablet 50 mg  50 mg Oral BID Shaune Pollack, MD   50 mg at 09/23/16 2138  . ondansetron (ZOFRAN) injection 4 mg  4 mg Intravenous Q6H PRN Shaune Pollack, MD      . PARoxetine (PAXIL) tablet 20 mg   20 mg Oral Once per day on Sun Tue Thu Sat Shaune Pollack, MD      . potassium chloride SA (K-DUR,KLOR-CON) CR tablet 40 mEq  40 mEq Oral BID Shaune Pollack, MD   40 mEq at 09/23/16 2134  . senna-docusate (Senokot-S) tablet 1 tablet  1 tablet Oral QHS PRN Shaune Pollack, MD      . sodium chloride flush (NS) 0.9 % injection 3 mL  3 mL Intravenous Q12H Shaune Pollack, MD   3 mL at 09/23/16 0736  . sodium chloride flush (NS) 0.9 % injection 3 mL  3 mL Intravenous PRN Shaune Pollack, MD      . traMADol Janean Sark) tablet 50 mg  50 mg Oral Q6H PRN Oralia Manis, MD   50 mg at 09/22/16 2110  . vancomycin (VANCOCIN) IVPB 1000 mg/200 mL premix  1,000 mg Intravenous Q18H Sheema M Hallaji, RPH   1,000 mg at 09/23/16 2237     Discharge Medications: Please see discharge summary for a list of discharge medications.  Relevant Imaging Results:  Relevant Lab Results:   Additional Information  (SSN: [id number])  Ralene Bathe, Student-Social Work

## 2016-09-24 NOTE — Progress Notes (Signed)
Pharmacy Antibiotic Note  Elray BubaMartha Duty is a 83 y.o. female admitted on 09/22/2016 with  pneumonia.  Pharmacy has been consulted for vancomycin dosing. Patient is also receiving cefepime. MRSA PCR is positive.   Plan: Vancomycin level resulted @ 24. Will change Vancomycin dose to 1 g IV q24 hours. Will recheck trough level prior to the 4th dose of new regimen.    Height: 5\' 4"  (162.6 cm) Weight: 159 lb 9.8 oz (72.4 kg) IBW/kg (Calculated) : 54.7  Temp (24hrs), Avg:98.4 F (36.9 C), Min:98.4 F (36.9 C), Max:98.5 F (36.9 C)   Recent Labs Lab 09/22/16 1422 09/24/16 1341  WBC 7.6  --   CREATININE 0.75  --   LATICACIDVEN 1.7  --   VANCOTROUGH  --  24*    Estimated Creatinine Clearance: 49.2 mL/min (by C-G formula based on SCr of 0.75 mg/dL).    Allergies  Allergen Reactions  . Aliskiren-Hydrochlorothiazide Other (See Comments)    Reaction: unknown  . Amlodipine Other (See Comments)    Reaction: unknown  . Sulfa Antibiotics Swelling    Antimicrobials this admission: 3/25 cefepime >>  3/25 vancomycin >>  Dose adjustments this admission:   Microbiology results: 3/25  BCx: NGTD 3/25 Sputum: sent 3/25 MRSA PCR: positive  UCx: sent  Thank you for allowing pharmacy to be a part of this patient's care.  Demetrius CharityJames,Phoebie Shad D, PharmD, BCPS Clinical Pharmacist 09/24/2016 2:53 PM

## 2016-09-24 NOTE — Progress Notes (Signed)
Speech Language Pathology Treatment: Dysphagia  Patient Details Name: [name] MRN: [id number] DOB: 1929-04-21 Today's Date: 09/24/2016 Time: 1230-1310 SLP Time Calculation (min) (ACUTE ONLY): 40 min  Assessment / Plan / Recommendation Clinical Impression  Pt seen for ongoing assessment of toleration of diet. Pt appears to present w/ min increased risk for aspiration secondary to declined Cognitive status and min oropharyngeal phase dysphagia evidenced by MBSS ~1 year ago. Although no aspiration was noted w/ thin liquids at that time, pt demonstrated a delayed pharyngeal swallow initiation w/ all liquids. Currently, Sister stated pt has been taking a Pureed diet w/ NECTAR consistency liquids in the past year and had "done very well" w/ this diet consistency. Pt consumed trials of purees w/ Nectar liquids VIA CUP during the lunch meal w/ no overt s/s of aspiration noted; oral phase appeared wfl w/ these consistencies. She did require feeding support.  \Pt does have declined Cognitive status and Parkinson's Disease as well as lacks dentition at baseline which can impact oropharyngeal swallow function. Her baseline diet consistency has been a dysphagia diet for several months per Sister. Due to this presentation, recommend continue a Dysphagia level 1 w/ Nectar consistency liquids; strict aspiration precautions; Pills given in Puree; monitoring w/ all oral intake and feeding support. SLP gave education on aspiration precautions; diet consistency and food prep ideas as well as options to moisten and flavor foods to encourage oral intake to Sister. NSG updated. No further skilled ST services indicated at this time as pt appears at her baseline; Sister pleased. NSG to reconsult if any decline in status while admitted.     HPI HPI: Pt is a 83 y.o. female with a known history of Hypertension, hyperlipidemia, Parkinson's disease w/ Cognitive decline, chronic systolic CHF and persistent A. fib. The patient  was sent from skilled nursing facility due to feeling sick and weakness. The patient was found shortness of breath and confused since this morning. She's hypoxia at 82%. Her PCO2 is 73 in the ED. She is put on BiPAP. CTof the chest show multifocal pneumonia. Pt has been on a dysphagia diet w/ recent facility utilizing Nectar consistency liquids. Pt did have a MBSS in 2/17 which did not indicate any aspiration w/ thin liquids though a delayed pharyngeal swallow initiation was noted increasing risk for aspiration. Pt is also edentulous and does better w/ mashed, pureed foods and Nectar consistency liquids, per Sister. Sister indicated pt has been tolerating this diet "well" for several months now.       SLP Plan  Continue with current plan of care       Recommendations  Diet recommendations: Dysphagia 1 (puree);Nectar-thick liquid Liquids provided via: Cup;No straw (baseline) Medication Administration: Whole meds with puree (but give crushed in puree as needed) Supervision: Staff to assist with self feeding;Full supervision/cueing for compensatory strategies Compensations: Minimize environmental distractions;Slow rate;Small sips/bites;Lingual sweep for clearance of pocketing;Multiple dry swallows after each bite/sip;Follow solids with liquid (rest breaks to allow for digestion) Postural Changes and/or Swallow Maneuvers: Seated upright 90 degrees;Upright 30-60 min after meal                General recommendations:  (Dietician f/u) Oral Care Recommendations: Oral care BID;Staff/trained caregiver to provide oral care Follow up Recommendations: Skilled Nursing facility SLP Visit Diagnosis: Dysphagia, oropharyngeal phase (R13.12) Plan: Continue with current plan of care       GO                Jerilynn Som, MS,  CCC-SLP Watson,Katherine 09/24/2016, 2:39 PM

## 2016-09-24 NOTE — Progress Notes (Signed)
[employer] [facility] [place] [facility]   PATIENT NAME: [name]    MR#:  [id number]  DATE OF BIRTH:  [birth year]  SUBJECTIVE:  CHIEF COMPLAINT:   Chief Complaint  Patient presents with  . Shortness of Breath     Came with hypoxia, required Bipap, now on nasal canula oxygen.  Pt is somewhat confused, but denies any complains.  REVIEW OF SYSTEMS:  CONSTITUTIONAL: No fever, fatigue or weakness.  EYES: No blurred or double vision.  EARS, NOSE, AND THROAT: No tinnitus or ear pain.  RESPIRATORY: No cough,positive for shortness of breath,no wheezing or hemoptysis.  CARDIOVASCULAR: No chest pain, orthopnea, edema.  GASTROINTESTINAL: No nausea, vomiting, diarrhea or abdominal pain.  GENITOURINARY: No dysuria, hematuria.  ENDOCRINE: No polyuria, nocturia,  HEMATOLOGY: No anemia, easy bruising or bleeding SKIN: No rash or lesion. MUSCULOSKELETAL: No joint pain or arthritis.   NEUROLOGIC: No tingling, numbness, weakness.  PSYCHIATRY: No anxiety or depression.   ROS  DRUG ALLERGIES:   Allergies  Allergen Reactions  . Aliskiren-Hydrochlorothiazide Other (See Comments)    Reaction: unknown  . Amlodipine Other (See Comments)    Reaction: unknown  . Sulfa Antibiotics Swelling    VITALS:  Blood pressure 125/74, pulse 87, temperature (!) 100.7 F (38.2 C), temperature source Oral, resp. rate 18, height 5\' 4"  (1.626 m), weight 72.4 kg (159 lb 9.8 oz), SpO2 97 %.  PHYSICAL EXAMINATION:  GENERAL:  83 y.o.-year-old patient lying in the bed with no acute distress.  EYES: Pupils equal, round, reactive to light and accommodation. No scleral icterus. Extraocular muscles intact.  HEENT: Head atraumatic, normocephalic. Oropharynx and nasopharynx clear.  NECK:  Supple, no jugular venous distention. No thyroid enlargement, no tenderness.  LUNGS: Normal breath sounds bilaterally, no wheezing, bilateral crepitation. No use of accessory muscles of respiration.  CARDIOVASCULAR: S1,  S2 normal. No murmurs, rubs, or gallops.  ABDOMEN: Soft, nontender, nondistended. Bowel sounds present. No organomegaly or mass.  EXTREMITIES: No pedal edema, cyanosis, or clubbing.  NEUROLOGIC: Cranial nerves II through XII are intact. Muscle strength 3/5 in upper extremities and 1/5 in lower extremities. Sensation intact. Gait not checked.  PSYCHIATRIC: The patient is alert and oriented x 1.  SKIN: No obvious rash, lesion, or ulcer.   Physical Exam LABORATORY PANEL:   CBC  Recent Labs Lab 09/22/16 1422  WBC 7.6  HGB 12.2  HCT 36.1  PLT 340   ------------------------------------------------------------------------------------------------------------------  Chemistries   Recent Labs Lab 09/22/16 1422  NA 140  K 3.9  CL 101  CO2 32  GLUCOSE 164*  BUN 31*  CREATININE 0.75  CALCIUM 8.8*  AST 29  ALT 20  ALKPHOS 73  BILITOT 0.6   ------------------------------------------------------------------------------------------------------------------  Cardiac Enzymes  Recent Labs Lab 09/22/16 1422  TROPONINI <0.03   ------------------------------------------------------------------------------------------------------------------  RADIOLOGY:  No results found.  ASSESSMENT AND PLAN:   Principal Problem:   Acute respiratory failure with hypercapnia (HCC) Active Problems:   Chronic systolic CHF (congestive heart failure) (HCC)   Persistent atrial fibrillation (HCC)   Multifocal pneumonia   Pressure injury of skin  * Acute respiratory failure with Hypercapnia due to multifocal pneumonia (HAP) Required BiPAP, nebulizer, on cefepime and vancomycin, follow-up CBC and cultures. Improved, now on nasal canula oxygen.   MRSA PCR is positive.  *Acute metabolic encephalopathy. Due to above. Aspiration and fall precaution.   Now more alert, SLP eval.  * UTI   Ur cx is growing gram neg rods.   Pending final result.  *Hypertension.  Continue home hypertension  medication.  *Chronic Systolic CHF. Continue Lasix. Stable.  *Chronic persistent A. fib. Rate controlled, continue Lopressor and Eliquis.  *Parkinson's disease.    All the records are reviewed and case discussed with Care Management/Social Workerr. Management plans discussed with the patient, family and they are in agreement.  CODE STATUS: DNR  TOTAL TIME TAKING CARE OF THIS PATIENT: 35 minutes.   Discussed with her sister in room.  POSSIBLE D/C IN 1-2 DAYS, DEPENDING ON CLINICAL CONDITION.   Altamese DillingVACHHANI, Hisayo Delossantos M.D on 09/24/2016   Between 7am to 6pm - Pager - [phone]  After 6pm go to [email or web address] - password [email or web address]  [facility] [place] [facility]  Office  [phone]  CC: Primary care physician; Tillman Abideichard Letvak, MD  Note: This dictation was prepared with Dragon dictation along with smaller phrase technology. Any transcriptional errors that result from this process are unintentional.

## 2016-09-24 NOTE — Clinical Social Work Note (Signed)
Clinical Social Work Assessment  Patient Details  Name: Bali Lyn MRN: [id number] Date of Birth: 1928-08-26  Date of referral:  09/24/16               Reason for consult:  Facility Placement, Discharge Planning                Permission sought to share information with:  Chartered certified accountant granted to share information::  Yes, Verbal Permission Granted  Name::      [facility]  Agency::   [place]   Relationship::     Contact Information:     Housing/Transportation Living arrangements for the past 2 months:  [place] ([facility]) Source of Information:  Other (Comment Required) (Patients sister Inez Catalina) Patient Interpreter Needed:  None Criminal Activity/Legal Involvement Pertinent to Current Situation/Hospitalization:  No - Comment as needed Significant Relationships:  Other Family Members Lives with:  Facility Resident [facility]) Do you feel safe going back to the place where you live?  Yes Need for family participation in patient care:  Yes (Comment)  Care giving concerns:  Patient is a long term care resident at [facility].    Social Worker assessment / plan:  Holiday representative (CSW) noted that patient was from facility. PT has not assessed patient at this time. Social work Theatre manager met with patient and patients sister, Inez Catalina, at bedside. Patient was asleep. Social work Theatre manager introduced self and explained role of social work department. Per patients sister, patient is a long-term care resident at [facility] and has been there since August 2017. Patient has no children. Patients HPOA is patient's sister Inez Catalina. Patients sister stated that patient usually sits in the geri-chair at the facility and is not on oxygen. Social work Theatre manager explained that the social work department will work on getting patient back to [facility] when patient is medically stable for discharge. Patient's sister verbally agreed she  understood and was agreeable for patient to return to facility. Seth Bake, admissions coordinator at [facility] confirmed patient was a long-term resident and can return when medically stable for discharge. Social work Theatre manager will continue to assist and follow as needed.   FL2 completed and faxed out.   Employment status:  Unemployed Forensic scientist:  Medicare PT Recommendations:  Not assessed at this time Information / Referral to community resources:  [place]  Patient/Family's Response to care: Patient's sister is agreeable for patient to return to [facility] when medically stable for discharge.   Patient/Family's Understanding of and Emotional Response to Diagnosis, Current Treatment, and Prognosis:  Patient's sister was pleasant and thanked social work Theatre manager for visiting.   Emotional Assessment Appearance:  Appears stated age Attitude/Demeanor/Rapport:  Unable to Assess Affect (typically observed):  Unable to Assess Orientation:  Oriented to Self, Oriented to Place, Oriented to Situation Alcohol / Substance use:  Not Applicable Psych involvement (Current and /or in the community):  No (Comment)  Discharge Needs  Concerns to be addressed:  Basic Needs Readmission within the last 30 days:  No Current discharge risk:  Terminally ill Barriers to Discharge:  Continued Medical Work up   [employer], [place] Work 09/24/2016, 8:56 AM

## 2016-09-24 NOTE — Telephone Encounter (Signed)
Initial appointment made for the CHF Clinic on 10/02/2016 @ 11:40 am.

## 2016-09-25 ENCOUNTER — Ambulatory Visit: Payer: Medicare Other | Admitting: Cardiovascular Disease

## 2016-09-25 LAB — BLOOD GAS, ARTERIAL
ACID-BASE EXCESS: 4.1 mmol/L — AB (ref 0.0–2.0)
BICARBONATE: 29.7 mmol/L — AB (ref 20.0–28.0)
DELIVERY SYSTEMS: POSITIVE
EXPIRATORY PAP: 5
FIO2: 0.28
INSPIRATORY PAP: 10
LHR: 10 {breaths}/min
O2 Saturation: 97.3 %
PEEP/CPAP: 5 cmH2O
Patient temperature: 37
pCO2 arterial: 48 mmHg (ref 32.0–48.0)
pH, Arterial: 7.4 (ref 7.350–7.450)
pO2, Arterial: 94 mmHg (ref 83.0–108.0)

## 2016-09-25 LAB — BASIC METABOLIC PANEL
ANION GAP: 4 — AB (ref 5–15)
BUN: 26 mg/dL — ABNORMAL HIGH (ref 6–20)
CO2: 29 mmol/L (ref 22–32)
CREATININE: 0.85 mg/dL (ref 0.44–1.00)
Calcium: 8.4 mg/dL — ABNORMAL LOW (ref 8.9–10.3)
Chloride: 104 mmol/L (ref 101–111)
Glucose, Bld: 82 mg/dL (ref 65–99)
Potassium: 4.3 mmol/L (ref 3.5–5.1)
SODIUM: 137 mmol/L (ref 135–145)

## 2016-09-25 NOTE — Discharge Instructions (Signed)
Heart Failure Clinic appointment on October 02, 2016 at 11:40am with Anita Kindredina Maribel Luis, FNP. Please call [phone] to reschedule.

## 2016-09-25 NOTE — Progress Notes (Signed)
[facility] -  at [facility]   PATIENT NAME: [name]    MR#:  [id number]  DATE OF BIRTH:  1929/06/18  SUBJECTIVE:  CHIEF COMPLAINT:   Chief Complaint  Patient presents with  . Shortness of Breath     Came with hypoxia, required Bipap, now on nasal canula oxygen.  Pt is somewhat confused, but denies any complains.   She is more somnolent today, she had low-grade fever last night.  REVIEW OF SYSTEMS:  CONSTITUTIONAL: No fever, fatigue or weakness.  EYES: No blurred or double vision.  EARS, NOSE, AND THROAT: No tinnitus or ear pain.  RESPIRATORY: No cough,positive for shortness of breath,no wheezing or hemoptysis.  CARDIOVASCULAR: No chest pain, orthopnea, edema.  GASTROINTESTINAL: No nausea, vomiting, diarrhea or abdominal pain.  GENITOURINARY: No dysuria, hematuria.  ENDOCRINE: No polyuria, nocturia,  HEMATOLOGY: No anemia, easy bruising or bleeding SKIN: No rash or lesion. MUSCULOSKELETAL: No joint pain or arthritis.   NEUROLOGIC: No tingling, numbness, weakness.  PSYCHIATRY: No anxiety or depression.   ROS  DRUG ALLERGIES:   Allergies  Allergen Reactions  . Aliskiren-Hydrochlorothiazide Other (See Comments)    Reaction: unknown  . Amlodipine Other (See Comments)    Reaction: unknown  . Sulfa Antibiotics Swelling    VITALS:  Blood pressure (!) 113/50, pulse 83, temperature 97.9 F (36.6 C), temperature source Oral, resp. rate 16, height 5\' 4"  (1.626 m), weight 72.4 kg (159 lb 9.8 oz), SpO2 99 %.  PHYSICAL EXAMINATION:  GENERAL:  83 y.o.-year-old patient lying in the bed with no acute distress.  EYES: Pupils equal, round, reactive to light and accommodation. No scleral icterus. Extraocular muscles intact.  HEENT: Head atraumatic, normocephalic. Oropharynx and nasopharynx clear.  NECK:  Supple, no jugular venous distention. No thyroid enlargement, no tenderness.  LUNGS: Normal breath sounds bilaterally, no wheezing, bilateral crepitation.  No use of accessory muscles of respiration.  CARDIOVASCULAR: S1, S2 normal. No murmurs, rubs, or gallops.  ABDOMEN: Soft, nontender, nondistended. Bowel sounds present. No organomegaly or mass.  EXTREMITIES: No pedal edema, cyanosis, or clubbing.  NEUROLOGIC: Cranial nerves II through XII are intact. Muscle strength 3/5 in upper extremities and 1/5 in lower extremities. Sensation intact. Gait not checked.  PSYCHIATRIC: The patient is arousable and oriented x 1.  SKIN: No obvious rash, lesion, or ulcer.   Physical Exam LABORATORY PANEL:   CBC  Recent Labs Lab 09/22/16 1422  WBC 7.6  HGB 12.2  HCT 36.1  PLT 340   ------------------------------------------------------------------------------------------------------------------  Chemistries   Recent Labs Lab 09/22/16 1422 09/25/16 0520  NA 140 137  K 3.9 4.3  CL 101 104  CO2 32 29  GLUCOSE 164* 82  BUN 31* 26*  CREATININE 0.75 0.85  CALCIUM 8.8* 8.4*  AST 29  --   ALT 20  --   ALKPHOS 73  --   BILITOT 0.6  --    ------------------------------------------------------------------------------------------------------------------  Cardiac Enzymes  Recent Labs Lab 09/22/16 1422  TROPONINI <0.03   ------------------------------------------------------------------------------------------------------------------  RADIOLOGY:  No results found.  ASSESSMENT AND PLAN:   Principal Problem:   Acute respiratory failure with hypercapnia (HCC) Active Problems:   Chronic systolic CHF (congestive heart failure) (HCC)   Persistent atrial fibrillation (HCC)   Multifocal pneumonia   Pressure injury of skin  * Acute respiratory failure with Hypercapnia due to multifocal pneumonia (HAP) Required BiPAP, nebulizer, on cefepime and vancomycin, follow-up CBC and cultures. Improved, now on nasal canula oxygen.   MRSA PCR is positive.  Blood  cultures are negative on admission, she again had fever last night, will repeat cultures if  she have any more fevers.  *Acute metabolic encephalopathy. Due to above. Aspiration and fall precaution.   Now more alert, SLP eval.  * UTI   Ur cx is growing gram neg rods.   Pending final result.  On cefepime.  *Hypertension. Continue home hypertension medication.  *Chronic Systolic CHF. Continue Lasix. Stable.  *Chronic persistent A. fib. Rate controlled, continue Lopressor and Eliquis.  *Parkinson's disease.    All the records are reviewed and case discussed with Care Management/Social Workerr. Management plans discussed with the patient, family and they are in agreement.  CODE STATUS: DNR  TOTAL TIME TAKING CARE OF THIS PATIENT: 35 minutes.   Discussed with her sister in room. As she had fever last night, would like to monitor for 24 hours in hospital and her final urine culture result is still awaited.  POSSIBLE D/C IN 1-2 DAYS, DEPENDING ON CLINICAL CONDITION.   Altamese DillingVACHHANI, [name] M.D on 09/25/2016   Between 7am to 6pm - Pager - [phone]  After 6pm go to [email or web address] - password [email or web address]  [facility] [place] [facility]  Office  [phone]  CC: Primary care physician; Tillman Abideichard Letvak, MD  Note: This dictation was prepared with Dragon dictation along with smaller phrase technology. Any transcriptional errors that result from this process are unintentional.

## 2016-09-26 ENCOUNTER — Ambulatory Visit: Payer: Medicare Other | Admitting: Cardiovascular Disease

## 2016-09-26 LAB — CBC
HCT: 27.7 % — ABNORMAL LOW (ref 35.0–47.0)
Hemoglobin: 9.5 g/dL — ABNORMAL LOW (ref 12.0–16.0)
MCH: 34.9 pg — AB (ref 26.0–34.0)
MCHC: 34.4 g/dL (ref 32.0–36.0)
MCV: 101.6 fL — AB (ref 80.0–100.0)
Platelets: 254 10*3/uL (ref 150–440)
RBC: 2.73 MIL/uL — AB (ref 3.80–5.20)
RDW: 13.6 % (ref 11.5–14.5)
WBC: 6.5 10*3/uL (ref 3.6–11.0)

## 2016-09-26 LAB — URINE CULTURE

## 2016-09-26 LAB — BASIC METABOLIC PANEL
Anion gap: 5 (ref 5–15)
BUN: 27 mg/dL — AB (ref 6–20)
CHLORIDE: 106 mmol/L (ref 101–111)
CO2: 27 mmol/L (ref 22–32)
Calcium: 8.5 mg/dL — ABNORMAL LOW (ref 8.9–10.3)
Creatinine, Ser: 0.71 mg/dL (ref 0.44–1.00)
GFR calc Af Amer: >60 mL/min (ref >60)
GFR calc non Af Amer: >60 mL/min (ref >60)
GLUCOSE: 87 mg/dL (ref 65–99)
POTASSIUM: 4.2 mmol/L (ref 3.5–5.1)
SODIUM: 138 mmol/L (ref 135–145)

## 2016-09-26 MED ORDER — CEFUROXIME AXETIL 250 MG PO TABS: 250.0000 mg | ORAL_TABLET | Freq: Two times a day (BID) | ORAL | 0 refills | Status: AC

## 2016-09-26 MED ORDER — DOXYCYCLINE HYCLATE 100 MG PO CAPS: 100.0000 mg | ORAL_CAPSULE | Freq: Two times a day (BID) | ORAL | 0 refills | Status: AC

## 2016-09-26 NOTE — Progress Notes (Signed)
[facility] Verdon [facility]Jesse [facility], EMS called for transportation.

## 2016-09-26 NOTE — Progress Notes (Signed)
EMS here to transport pt. 

## 2016-09-26 NOTE — Progress Notes (Signed)
Patient is medically stable for discharge today to [facility]. Per Seth Bake, admissions coordinator at [facility], patient can come to room 304-A. Clinical Education officer, museum (CSW) sent discharge orders in [place]. Social work Theatre manager met with patient and patient's sister Inez Catalina at bedside making them aware of patient's discharge back to [facility] today. Patient's sister verbally agreed she understood. RN will call report and arrange EMS for transport. Please re-consult if future social work needs arise. Social work Architectural technologist off.   Nailani Full. Social Work Intern  [phone]

## 2016-09-26 NOTE — Discharge Summary (Signed)
[facility] - [place] at [facility]   PATIENT NAME: [name]    MR#:  [id number]  DATE OF BIRTH:  1928/10/19  DATE OF ADMISSION:  09/22/2016 ADMITTING PHYSICIAN: Shaune Pollack, MD  DATE OF DISCHARGE: 09/26/2016  PRIMARY CARE PHYSICIAN: Tillman Abide, MD    ADMISSION DIAGNOSIS:  HCAP (healthcare-associated pneumonia) [J18.9] Pneumonia [J18.9]  DISCHARGE DIAGNOSIS:  Principal Problem:   Acute respiratory failure with hypercapnia (HCC) Active Problems:   Chronic systolic CHF (congestive heart failure) (HCC)   Persistent atrial fibrillation (HCC)   Multifocal pneumonia   Pressure injury of skin  UTi   MRSA screen positive.  SECONDARY DIAGNOSIS:   Past Medical History:  Diagnosis Date  . Anxiety disorder   . Arthritis   . Chronic systolic CHF (congestive heart failure) (HCC)    a. echo 07/2014: EF 45-50%, dilated LA at 4.5 cm, mildly dilated RA, mod-sev MR, mod TR, mildly elevated PASP @ 45.2  . Cognitive impairment   . Depression   . Gait instability   . HTN (hypertension)   . Hyperlipidemia   . Panic attacks   . Parkinson's disease (HCC)   . Persistent atrial fibrillation (HCC)    a. Eliquis 5 mg bid; b. CHADS2VASc = 5 (CHF, HTN, age x 2, female)    HOSPITAL COURSE:   * Acute respiratory failure with Hypercapniadue to multifocal pneumonia (HAP) Required BiPAP, nebulizer, on cefepime and vancomycin, follow-up CBC and cultures. Improved, now on nasal canula oxygen.   MRSA PCR screening is positive.  Blood cultures are negative on admission,   Will give doxycycline on d/c to cover for MRSA.  *Acute metabolic encephalopathy. Due to above. Aspiration and fall precaution.   Now more alert, SLP eval.  * UTI   Ur cx is growing enterobacter   reviewed final result.   Received cefepime here, will give cefuroxime on d/c.  *Hypertension. Continue home hypertension medication.  *Chronic SystolicCHF. Continue Lasix. Stable.  *Chronic  persistent A. fib. Rate controlled, continue Lopressor and Eliquis.  *Parkinson's disease.   DISCHARGE CONDITIONS:   Stable.  CONSULTS OBTAINED:  Treatment Team:  Altamese Dilling, MD Merwyn Katos, MD  DRUG ALLERGIES:   Allergies  Allergen Reactions  . Aliskiren-Hydrochlorothiazide Other (See Comments)    Reaction: unknown  . Amlodipine Other (See Comments)    Reaction: unknown  . Sulfa Antibiotics Swelling    DISCHARGE MEDICATIONS:   Current Discharge Medication List    START taking these medications   Details  cefUROXime (CEFTIN) 250 MG tablet Take 1 tablet (250 mg total) by mouth 2 (two) times daily with a meal. Qty: 8 tablet, Refills: 0    doxycycline (VIBRAMYCIN) 100 MG capsule Take 1 capsule (100 mg total) by mouth 2 (two) times daily. Qty: 8 capsule, Refills: 0      CONTINUE these medications which have NOT CHANGED   Details  acetaminophen (TYLENOL) 325 MG tablet Take 650 mg by mouth every 4 (four) hours as needed for mild pain, moderate pain or fever.     apixaban (ELIQUIS) 5 MG TABS tablet Take 1 tablet (5 mg total) by mouth 2 (two) times daily. Qty: 60 tablet, Refills: 0    ferrous sulfate 325 (65 FE) MG EC tablet Take 325 mg by mouth daily.    fexofenadine (ALLEGRA) 180 MG tablet Take 180 mg by mouth daily.    furosemide (LASIX) 20 MG tablet Take 20 mg by mouth daily. At 1pm    ipratropium-albuterol (DUONEB) 0.5-2.5 (3)  MG/3ML SOLN Take 3 mLs by nebulization 2 (two) times daily. Qty: 360 mL, Refills: 1    levothyroxine (SYNTHROID, LEVOTHROID) 100 MCG tablet Take 50 mcg by mouth every evening.     metoprolol (LOPRESSOR) 50 MG tablet Take 50 mg by mouth 2 (two) times daily.     Multiple Vitamin (MULTIVITAMIN) tablet Take 1 tablet by mouth daily.    Nutritional Supplements (ARGINAID EXTRA) LIQD Take 1 Dose by mouth 2 (two) times daily.    !! PARoxetine (PAXIL) 10 MG tablet Take 10 mg by mouth See admin instructions. Every mon, wed, fri     !! PARoxetine (PAXIL) 20 MG tablet Take 20 mg by mouth See admin instructions. Every tues, thurs, sat, sun    potassium chloride SA (K-DUR,KLOR-CON) 20 MEQ tablet Take 40 mEq by mouth 2 (two) times daily.      !! - Potential duplicate medications found. Please discuss with provider.       DISCHARGE INSTRUCTIONS:    Follow with PMD in 1-2 weeks.  If you experience worsening of your admission symptoms, develop shortness of breath, life threatening emergency, suicidal or homicidal thoughts you must seek medical attention immediately by calling 911 or calling your MD immediately  if symptoms less severe.  You Must read complete instructions/literature along with all the possible adverse reactions/side effects for all the Medicines you take and that have been prescribed to you. Take any new Medicines after you have completely understood and accept all the possible adverse reactions/side effects.   Please note  You were cared for by a hospitalist during your hospital stay. If you have any questions about your discharge medications or the care you received while you were in the hospital after you are discharged, you can call the unit and asked to speak with the hospitalist on call if the hospitalist that took care of you is not available. Once you are discharged, your primary care physician will handle any further medical issues. Please note that NO REFILLS for any discharge medications will be authorized once you are discharged, as it is imperative that you return to your primary care physician (or establish a relationship with a primary care physician if you do not have one) for your aftercare needs so that they can reassess your need for medications and monitor your lab values.    Today   CHIEF COMPLAINT:   Chief Complaint  Patient presents with  . Shortness of Breath    HISTORY OF PRESENT ILLNESS:  [name]  is a 83 y.o. female with a known history of Hypertension, hyperlipidemia,  Parkinson's disease, chronic systolic CHF and persistent A. fib. The patient was sent from skilled nursing facility due to above chief complaint. According to the patient's sister, the patient is flying into yesterday. She found the patient was sick and weak. The patient was found shortness of breath and confused since this morning. She's hypoxia at 82%. Her PCO2 is 73 in the ED. She is put on BiPAP. CTof the chest show multifocal pneumonia.   VITAL SIGNS:  Blood pressure 129/78, pulse 74, temperature 99.2 F (37.3 C), temperature source Oral, resp. rate 16, height 5\' 4"  (1.626 m), weight 72.4 kg (159 lb 9.8 oz), SpO2 95 %.  I/O:  No intake or output data in the 24 hours ending 09/26/16 1210  PHYSICAL EXAMINATION:   GENERAL:  83 y.o.-year-old patient lying in the bed with no acute distress.  EYES: Pupils equal, round, reactive to light and accommodation. No scleral  icterus. Extraocular muscles intact.  HEENT: Head atraumatic, normocephalic. Oropharynx and nasopharynx clear.  NECK:  Supple, no jugular venous distention. No thyroid enlargement, no tenderness.  LUNGS: Normal breath sounds bilaterally, no wheezing, bilateral crepitation. No use of accessory muscles of respiration.  CARDIOVASCULAR: S1, S2 normal. No murmurs, rubs, or gallops.  ABDOMEN: Soft, nontender, nondistended. Bowel sounds present. No organomegaly or mass.  EXTREMITIES: No pedal edema, cyanosis, or clubbing.  NEUROLOGIC: Cranial nerves II through XII are intact. Muscle strength 3/5 in upper extremities and 1/5 in lower extremities. Sensation intact. Gait not checked.  PSYCHIATRIC: The patient is arousable and oriented x 1.  SKIN: No obvious rash, lesion, or ulcer.   DATA REVIEW:   CBC  Recent Labs Lab 09/26/16 0709  WBC 6.5  HGB 9.5*  HCT 27.7*  PLT 254    Chemistries   Recent Labs Lab 09/22/16 1422  09/26/16 0709  NA 140  < > 138  K 3.9  < > 4.2  CL 101  < > 106  CO2 32  < > 27  GLUCOSE 164*  < > 87   BUN 31*  < > 27*  CREATININE 0.75  < > 0.71  CALCIUM 8.8*  < > 8.5*  AST 29  --   --   ALT 20  --   --   ALKPHOS 73  --   --   BILITOT 0.6  --   --   < > = values in this interval not displayed.  Cardiac Enzymes  Recent Labs Lab 09/22/16 1422  TROPONINI <0.03    Microbiology Results  Results for orders placed or performed during the hospital encounter of 09/22/16  Culture, blood (routine x 2)     Status: None (Preliminary result)   Collection Time: 09/22/16  2:22 PM  Result Value Ref Range Status   Specimen Description BLOOD  LEFT AC  Final   Special Requests BOTTLES DRAWN AEROBIC AND ANAEROBIC  BCHV  Final   Culture NO GROWTH 4 DAYS  Final   Report Status PENDING  Incomplete  Urine culture     Status: Abnormal   Collection Time: 09/22/16  2:54 PM  Result Value Ref Range Status   Specimen Description URINE, RANDOM  Final   Special Requests NONE  Final   Culture >=100,000 COLONIES/mL ENTEROBACTER SPECIES (A)  Final   Report Status 09/26/2016 FINAL  Final   Organism ID, Bacteria ENTEROBACTER SPECIES (A)  Final      Susceptibility   Enterobacter species - MIC*    CEFAZOLIN >=64 RESISTANT Resistant     CEFTRIAXONE <=1 SENSITIVE Sensitive     CIPROFLOXACIN <=0.25 SENSITIVE Sensitive     GENTAMICIN <=1 SENSITIVE Sensitive     IMIPENEM <=0.25 SENSITIVE Sensitive     NITROFURANTOIN <=16 SENSITIVE Sensitive     TRIMETH/SULFA <=20 SENSITIVE Sensitive     PIP/TAZO <=4 SENSITIVE Sensitive     * >=100,000 COLONIES/mL ENTEROBACTER SPECIES  Culture, blood (routine x 2)     Status: None (Preliminary result)   Collection Time: 09/22/16  5:19 PM  Result Value Ref Range Status   Specimen Description BLOOD LEFT HAND  Final   Special Requests   Final    BOTTLES DRAWN AEROBIC AND ANAEROBIC Blood Culture results may not be optimal due to an excessive volume of blood received in culture bottles   Culture NO GROWTH 4 DAYS  Final   Report Status PENDING  Incomplete  MRSA PCR Screening  Status: Abnormal   Collection Time: 09/22/16  6:27 PM  Result Value Ref Range Status   MRSA by PCR POSITIVE (A) NEGATIVE Final    Comment:        The GeneXpert MRSA Assay (FDA approved for NASAL specimens only), is one component of a comprehensive MRSA colonization surveillance program. It is not intended to diagnose MRSA infection nor to guide or monitor treatment for MRSA infections. RESULT CALLED TO, READ BACK BY AND VERIFIED WITH: ROBERT SwazilandJORDAN ON 09/22/16 AT 2003 [facility]     RADIOLOGY:  No results found.  EKG:   Orders placed or performed during the hospital encounter of 09/22/16  . ED EKG  . ED EKG  . EKG 12-Lead  . EKG 12-Lead      Management plans discussed with the patient, family and they are in agreement.  CODE STATUS:     Code Status Orders        Start     Ordered   09/22/16 1758  Do not attempt resuscitation (DNR)  Continuous    Question Answer Comment  In the event of cardiac or respiratory ARREST Do not call a "code blue"   In the event of cardiac or respiratory ARREST Do not perform Intubation, CPR, defibrillation or ACLS   In the event of cardiac or respiratory ARREST Use medication by any route, position, wound care, and other measures to relive pain and suffering. May use oxygen, suction and manual treatment of airway obstruction as needed for comfort.      09/22/16 1757    Code Status History    Date Active Date Inactive Code Status Order ID Comments User Context   09/18/2015  3:39 AM 09/20/2015  1:35 PM DNR [id number]  Ihor AustinPavan Pyreddy, MD ED   08/13/2015 12:27 PM 08/16/2015  5:54 PM DNR [id number]  Milagros LollSrikar Sudini, MD ED    Advance Directive Documentation     Most Recent Value  Type of Advance Directive  Out of facility DNR (pink MOST or yellow form)  Pre-existing out of facility DNR order (yellow form or pink MOST form)  Yellow form placed in chart (order not valid for inpatient use)  "MOST" Form in Place?  -      TOTAL TIME TAKING CARE OF THIS  PATIENT: 35 minutes.    Altamese DillingVACHHANI, Alvon Nygaard M.D on 09/26/2016 at 12:10 PM  Between 7am to 6pm - Pager - [phone]  After 6pm go to [email or web address] - password [email or web address]  [facility] [place] [facility]  Office  [phone]  CC: Primary care physician; Tillman Abideichard Letvak, MD   Note: This dictation was prepared with Dragon dictation along with smaller phrase technology. Any transcriptional errors that result from this process are unintentional.

## 2016-09-27 LAB — CULTURE, BLOOD (ROUTINE X 2)
CULTURE: NO GROWTH
CULTURE: NO GROWTH

## 2016-09-30 ENCOUNTER — Telehealth: Payer: Self-pay | Admitting: Internal Medicine

## 2016-09-30 NOTE — Telephone Encounter (Signed)
I spoke to [place] and I let her know Dr.Letvak will see patient tomorrow at [facility].  I cancelled appointment at our office.

## 2016-09-30 NOTE — Telephone Encounter (Signed)
Patient's sister wanted to know if the patient needs to be seen in the office on 10/04/16 for a hospital follow-up.  She was assuming the patient would be seen at [facility] since she has been in the bed.  Please advise rather to keep the appt in our office or not.

## 2016-09-30 NOTE — Telephone Encounter (Signed)
Please let her know that I am going to see her tomorrow at [facility]. Okay to cancel the appt here

## 2016-10-01 DIAGNOSIS — F0150 Vascular dementia without behavioral disturbance: Secondary | ICD-10-CM

## 2016-10-01 DIAGNOSIS — F39 Unspecified mood [affective] disorder: Secondary | ICD-10-CM

## 2016-10-01 DIAGNOSIS — I481 Persistent atrial fibrillation: Secondary | ICD-10-CM

## 2016-10-01 DIAGNOSIS — I5022 Chronic systolic (congestive) heart failure: Secondary | ICD-10-CM

## 2016-10-01 DIAGNOSIS — L97509 Non-pressure chronic ulcer of other part of unspecified foot with unspecified severity: Secondary | ICD-10-CM

## 2016-10-01 DIAGNOSIS — J691 Pneumonitis due to inhalation of oils and essences: Secondary | ICD-10-CM

## 2016-10-02 ENCOUNTER — Ambulatory Visit: Payer: Medicare Other | Admitting: Family

## 2016-10-04 ENCOUNTER — Ambulatory Visit: Payer: Medicare Other | Admitting: Internal Medicine

## 2016-10-25 ENCOUNTER — Ambulatory Visit: Payer: Medicare Other | Admitting: Cardiovascular Disease

## 2016-10-27 ENCOUNTER — Emergency Department: Payer: Medicare Other

## 2016-10-27 ENCOUNTER — Inpatient Hospital Stay
Admission: EM | Admit: 2016-10-27 | Discharge: 2016-11-01 | DRG: 291 | Disposition: A | Discharge status: Skilled Nursing Facility | Payer: Medicare Other | Attending: Internal Medicine | Admitting: Internal Medicine

## 2016-10-27 ENCOUNTER — Encounter: Payer: Self-pay | Admitting: Radiology

## 2016-10-27 DIAGNOSIS — R000 Tachycardia, unspecified: Secondary | ICD-10-CM | POA: Diagnosis present

## 2016-10-27 DIAGNOSIS — J40 Bronchitis, not specified as acute or chronic: Secondary | ICD-10-CM | POA: Diagnosis present

## 2016-10-27 DIAGNOSIS — Z7189 Other specified counseling: Secondary | ICD-10-CM | POA: Diagnosis not present

## 2016-10-27 DIAGNOSIS — J690 Pneumonitis due to inhalation of food and vomit: Secondary | ICD-10-CM | POA: Diagnosis present

## 2016-10-27 DIAGNOSIS — R7881 Bacteremia: Secondary | ICD-10-CM | POA: Diagnosis present

## 2016-10-27 DIAGNOSIS — R0602 Shortness of breath: Secondary | ICD-10-CM | POA: Diagnosis not present

## 2016-10-27 DIAGNOSIS — J9621 Acute and chronic respiratory failure with hypoxia: Secondary | ICD-10-CM | POA: Diagnosis present

## 2016-10-27 DIAGNOSIS — B9562 Methicillin resistant Staphylococcus aureus infection as the cause of diseases classified elsewhere: Secondary | ICD-10-CM | POA: Diagnosis present

## 2016-10-27 DIAGNOSIS — F410 Panic disorder [episodic paroxysmal anxiety] without agoraphobia: Secondary | ICD-10-CM | POA: Diagnosis present

## 2016-10-27 DIAGNOSIS — J189 Pneumonia, unspecified organism: Secondary | ICD-10-CM | POA: Diagnosis not present

## 2016-10-27 DIAGNOSIS — E785 Hyperlipidemia, unspecified: Secondary | ICD-10-CM | POA: Diagnosis present

## 2016-10-27 DIAGNOSIS — I4891 Unspecified atrial fibrillation: Secondary | ICD-10-CM | POA: Diagnosis not present

## 2016-10-27 DIAGNOSIS — Z993 Dependence on wheelchair: Secondary | ICD-10-CM

## 2016-10-27 DIAGNOSIS — M1990 Unspecified osteoarthritis, unspecified site: Secondary | ICD-10-CM | POA: Diagnosis present

## 2016-10-27 DIAGNOSIS — Z882 Allergy status to sulfonamides status: Secondary | ICD-10-CM

## 2016-10-27 DIAGNOSIS — R1310 Dysphagia, unspecified: Secondary | ICD-10-CM | POA: Diagnosis present

## 2016-10-27 DIAGNOSIS — F0280 Dementia in other diseases classified elsewhere without behavioral disturbance: Secondary | ICD-10-CM | POA: Diagnosis present

## 2016-10-27 DIAGNOSIS — R0603 Acute respiratory distress: Secondary | ICD-10-CM | POA: Diagnosis present

## 2016-10-27 DIAGNOSIS — Y95 Nosocomial condition: Secondary | ICD-10-CM | POA: Diagnosis present

## 2016-10-27 DIAGNOSIS — G20 Parkinson's disease: Secondary | ICD-10-CM | POA: Diagnosis present

## 2016-10-27 DIAGNOSIS — I481 Persistent atrial fibrillation: Secondary | ICD-10-CM | POA: Diagnosis present

## 2016-10-27 DIAGNOSIS — F419 Anxiety disorder, unspecified: Secondary | ICD-10-CM | POA: Diagnosis present

## 2016-10-27 DIAGNOSIS — I110 Hypertensive heart disease with heart failure: Principal | ICD-10-CM | POA: Diagnosis present

## 2016-10-27 DIAGNOSIS — Z888 Allergy status to other drugs, medicaments and biological substances status: Secondary | ICD-10-CM

## 2016-10-27 DIAGNOSIS — I5043 Acute on chronic combined systolic (congestive) and diastolic (congestive) heart failure: Secondary | ICD-10-CM | POA: Diagnosis not present

## 2016-10-27 DIAGNOSIS — I10 Essential (primary) hypertension: Secondary | ICD-10-CM | POA: Diagnosis present

## 2016-10-27 DIAGNOSIS — Z8249 Family history of ischemic heart disease and other diseases of the circulatory system: Secondary | ICD-10-CM | POA: Diagnosis not present

## 2016-10-27 DIAGNOSIS — I482 Chronic atrial fibrillation, unspecified: Secondary | ICD-10-CM | POA: Diagnosis present

## 2016-10-27 DIAGNOSIS — R269 Unspecified abnormalities of gait and mobility: Secondary | ICD-10-CM | POA: Diagnosis present

## 2016-10-27 DIAGNOSIS — I509 Heart failure, unspecified: Secondary | ICD-10-CM

## 2016-10-27 DIAGNOSIS — R4189 Other symptoms and signs involving cognitive functions and awareness: Secondary | ICD-10-CM | POA: Diagnosis present

## 2016-10-27 DIAGNOSIS — L89629 Pressure ulcer of left heel, unspecified stage: Secondary | ICD-10-CM | POA: Diagnosis present

## 2016-10-27 DIAGNOSIS — F329 Major depressive disorder, single episode, unspecified: Secondary | ICD-10-CM | POA: Diagnosis present

## 2016-10-27 DIAGNOSIS — R0600 Dyspnea, unspecified: Secondary | ICD-10-CM | POA: Diagnosis not present

## 2016-10-27 DIAGNOSIS — Z79899 Other long term (current) drug therapy: Secondary | ICD-10-CM

## 2016-10-27 DIAGNOSIS — Z515 Encounter for palliative care: Secondary | ICD-10-CM | POA: Diagnosis not present

## 2016-10-27 DIAGNOSIS — Z66 Do not resuscitate: Secondary | ICD-10-CM | POA: Diagnosis present

## 2016-10-27 DIAGNOSIS — Z7901 Long term (current) use of anticoagulants: Secondary | ICD-10-CM

## 2016-10-27 DIAGNOSIS — G20A1 Parkinson's disease without dyskinesia, without mention of fluctuations: Secondary | ICD-10-CM

## 2016-10-27 LAB — CBC WITH DIFFERENTIAL/PLATELET
BASOS ABS: 0 10*3/uL (ref 0–0.1)
Basophils Relative: 1 %
EOS ABS: 0.2 10*3/uL (ref 0–0.7)
EOS PCT: 2 %
HEMATOCRIT: 36.1 % (ref 35.0–47.0)
HEMOGLOBIN: 12.1 g/dL (ref 12.0–16.0)
LYMPHS ABS: 1.8 10*3/uL (ref 1.0–3.6)
LYMPHS PCT: 20 %
MCH: 34.1 pg — AB (ref 26.0–34.0)
MCHC: 33.5 g/dL (ref 32.0–36.0)
MCV: 101.9 fL — AB (ref 80.0–100.0)
MONOS PCT: 14 %
Monocytes Absolute: 1.3 10*3/uL — ABNORMAL HIGH (ref 0.2–0.9)
Neutro Abs: 5.9 10*3/uL (ref 1.4–6.5)
Neutrophils Relative %: 63 %
PLATELETS: 290 10*3/uL (ref 150–440)
RBC: 3.55 MIL/uL — AB (ref 3.80–5.20)
RDW: 13.3 % (ref 11.5–14.5)
WBC: 9.3 10*3/uL (ref 3.6–11.0)

## 2016-10-27 LAB — COMPREHENSIVE METABOLIC PANEL
ALT: 33 U/L (ref 14–54)
AST: 42 U/L — ABNORMAL HIGH (ref 15–41)
Albumin: 3.3 g/dL — ABNORMAL LOW (ref 3.5–5.0)
Alkaline Phosphatase: 72 U/L (ref 38–126)
Anion gap: 8 (ref 5–15)
BUN: 25 mg/dL — ABNORMAL HIGH (ref 6–20)
CALCIUM: 8.9 mg/dL (ref 8.9–10.3)
CHLORIDE: 101 mmol/L (ref 101–111)
CO2: 33 mmol/L — AB (ref 22–32)
CREATININE: 0.86 mg/dL (ref 0.44–1.00)
GFR calc non Af Amer: 59 mL/min — ABNORMAL LOW (ref >60)
GLUCOSE: 110 mg/dL — AB (ref 65–99)
POTASSIUM: 3.6 mmol/L (ref 3.5–5.1)
SODIUM: 142 mmol/L (ref 135–145)
Total Bilirubin: 0.8 mg/dL (ref 0.3–1.2)
Total Protein: 7.1 g/dL (ref 6.5–8.1)

## 2016-10-27 LAB — BRAIN NATRIURETIC PEPTIDE: B Natriuretic Peptide: 160 pg/mL — ABNORMAL HIGH (ref 0.0–100.0)

## 2016-10-27 LAB — TROPONIN I
TROPONIN I: 0.03 ng/mL — AB (ref <0.03)
Troponin I: 0.03 ng/mL (ref <0.03)

## 2016-10-27 MED ORDER — PIPERACILLIN-TAZOBACTAM 3.375 G IVPB 30 MIN
3.3750 g | Freq: Once | INTRAVENOUS | Status: AC
  Administered 2016-10-27: 3.375 g via INTRAVENOUS
  Filled 2016-10-27: qty 50

## 2016-10-27 MED ORDER — IPRATROPIUM-ALBUTEROL 0.5-2.5 (3) MG/3ML IN SOLN
3.0000 mL | Freq: Once | RESPIRATORY_TRACT | Status: AC
  Administered 2016-10-27: 3 mL via RESPIRATORY_TRACT
  Filled 2016-10-27: qty 3

## 2016-10-27 MED ORDER — FUROSEMIDE 10 MG/ML IJ SOLN
40.0000 mg | Freq: Every day | INTRAMUSCULAR | Status: DC
  Administered 2016-10-28 – 2016-10-29 (×2): 40 mg via INTRAVENOUS
  Filled 2016-10-27 (×2): qty 4

## 2016-10-27 MED ORDER — CLINDAMYCIN PHOSPHATE 600 MG/50ML IV SOLN
600.0000 mg | Freq: Three times a day (TID) | INTRAVENOUS | Status: DC
  Administered 2016-10-28 (×2): 600 mg via INTRAVENOUS
  Filled 2016-10-27 (×3): qty 50

## 2016-10-27 MED ORDER — IOPAMIDOL (ISOVUE-370) INJECTION 76%
75.0000 mL | Freq: Once | INTRAVENOUS | Status: AC | PRN
  Administered 2016-10-27: 75 mL via INTRAVENOUS

## 2016-10-27 NOTE — ED Provider Notes (Signed)
[facility] Emergency Department Provider Note   ____________________________________________   First MD Initiated Contact with Patient 10/27/16 1458     (approximate)  I have reviewed the triage vital signs and the nursing notes.   HISTORY  Chief Complaint Respiratory Distress  HPI [name] is a 83 y.o. femalewho comes from 12 [facility] for coughing and rattling respirations and feeling short of breath. Patient got  soluMedrol and 2 DuoNeb prior to arrival and feels a little bit better. Patient has had a history of pneumonia in the past.patient feels moderately short of breath and seems to be a little worse if she exercises.   Past Medical History:  Diagnosis Date  . Anxiety disorder   . Arthritis   . Chronic systolic CHF (congestive heart failure) (HCC)    a. echo 07/2014: EF 45-50%, dilated LA at 4.5 cm, mildly dilated RA, mod-sev MR, mod TR, mildly elevated PASP @ 45.2  . Cognitive impairment   . Depression   . Gait instability   . HTN (hypertension)   . Hyperlipidemia   . Panic attacks   . Parkinson's disease (HCC)   . Persistent atrial fibrillation (HCC)    a. Eliquis 5 mg bid; b. CHADS2VASc = 5 (CHF, HTN, age x 2, female)    Patient Active Problem List   Diagnosis Date Noted  . Pressure injury of skin 09/23/2016  . Acute respiratory failure with hypercapnia (HCC) 09/23/2016  . Multifocal pneumonia 09/22/2016  . Altered mental state 09/18/2015  . UTI (lower urinary tract infection) 09/18/2015  . Pressure ulcer 09/18/2015  . Acute suppurative parotitis   . Atrial fibrillation with RVR (HCC)   . Hypokalemia   . Sepsis (HCC)   . Morbid obesity due to excess calories (HCC)   . Submandibular gland infection 08/13/2015  . Bilateral leg edema 10/24/2014  . Chronic atrial fibrillation (HCC) 08/23/2014  . Chronic systolic CHF (congestive heart failure) (HCC)   . Persistent atrial fibrillation (HCC)   . HTN (hypertension)   . Cognitive  impairment   . Gait instability   . Depression   . Panic attacks     Past Surgical History:  Procedure Laterality Date  . EYE SURGERY      Prior to Admission medications   Medication Sig Start Date End Date Taking? Authorizing Provider  acetaminophen (TYLENOL) 325 MG tablet Take 650 mg by mouth every 4 (four) hours as needed for mild pain, moderate pain or fever.    Yes Historical Provider, MD  apixaban (ELIQUIS) 5 MG TABS tablet Take 1 tablet (5 mg total) by mouth 2 (two) times daily. 08/16/15  Yes Adrian Saran, MD  ferrous sulfate 325 (65 FE) MG EC tablet Take 325 mg by mouth daily.   Yes Historical Provider, MD  fexofenadine (ALLEGRA) 180 MG tablet Take 180 mg by mouth daily.   Yes Historical Provider, MD  furosemide (LASIX) 40 MG tablet Take 20-40 mg by mouth 2 (two) times daily. Take  qam and  @ 1300   Yes Historical Provider, MD  ipratropium-albuterol (DUONEB) 0.5-2.5 (3) MG/3ML SOLN Take 3 mLs by nebulization 2 (two) times daily. Patient taking differently: Take 3 mLs by nebulization 3 (three) times daily as needed.  12/17/15  Yes Emily Filbert, MD  levothyroxine (SYNTHROID, LEVOTHROID) 50 MCG tablet Take 50 mcg by mouth every evening.    Yes Historical Provider, MD  metoprolol (LOPRESSOR) 50 MG tablet Take 50 mg by mouth 2 (two) times daily.  07/18/14  Yes Historical Provider, MD  Multiple Vitamin (MULTIVITAMIN) tablet Take 1 tablet by mouth daily.   Yes Historical Provider, MD  Nutritional Supplements (ARGINAID EXTRA) LIQD Take 1 Dose by mouth 2 (two) times daily.   Yes Historical Provider, MD  PARoxetine (PAXIL) 10 MG tablet Take 10 mg by mouth every Monday, Wednesday, and Friday.    Yes Historical Provider, MD  PARoxetine (PAXIL) 20 MG tablet Take 20 mg by mouth See admin instructions. Every tues, thurs, sat, sun   Yes Historical Provider, MD  potassium chloride SA (K-DUR,KLOR-CON) 20 MEQ tablet Take 40 mEq by mouth 2 (two) times daily.    Yes Historical Provider, MD     Allergies Aliskiren-hydrochlorothiazide; Amlodipine; and Sulfa antibiotics  Family History  Problem Relation Age of Onset  . Heart disease Sister     Social History Social History  Substance Use Topics  . Smoking status: Never Smoker  . Smokeless tobacco: Never Used  . Alcohol use No    Review of Systems  Constitutional: No fever/chills Eyes: No visual changes. ENT: No sore throat. Cardiovascular: Denies chest pain. Respiratory:shortness of breath. Gastrointestinal: No abdominal pain.  No nausea, no vomiting.  No diarrhea.  No constipation. Genitourinary: Negative for dysuria. Musculoskeletal: Negative for back pain. Skin: Negative for rash. Neurological: Negative for headaches, focal weakness or numbness.   ____________________________________________   PHYSICAL EXAM:  VITAL SIGNS: ED Triage Vitals [10/27/16 1505]  Enc Vitals Group     BP (!) 139/109     Pulse Rate 86     Resp (!) 23     Temp 98 F (36.7 C)     Temp Source Oral     SpO2 100 %     Weight      Height      Head Circumference      Peak Flow      Pain Score      Pain Loc      Pain Edu?      Excl. in GC?     Constitutional: Alert and oriented. illappearing and in no acute distress. Eyes: Conjunctivae are normal. PERRL. EOMI. Head: Atraumatic. Nose: No congestion/rhinnorhea. Mouth/Throat: Mucous membranes are moist.  Oropharynx non-erythematous. Neck: No stridor.  Cardiovascular: Normal rate, regular rhythm. Grossly normal heart sounds.  Good peripheral circulation. Respiratory: increased respiratory effort.  No retractions. Lungs rhonchi throughout Gastrointestinal: Soft and nontender. No distention. No abdominal bruits. No CVA tenderness. Musculoskeletal: No lower extremity tenderness trace edema.  No joint effusions. Neurologic:  Normal speech and language. No gross focal neurologic deficits are appreciated. No gait instability. Skin:  Skin is warm, dry and intact. No rash  noted. Psychiatric: Mood and affect are normal. Speech and behavior are normal.  ____________________________________________   LABS (all labs ordered are listed, but only abnormal results are displayed)  Labs Reviewed  TROPONIN I - Abnormal; Notable for the following:       Result Value   Troponin I 0.03 (*)    All other components within normal limits  COMPREHENSIVE METABOLIC PANEL - Abnormal; Notable for the following:    CO2 33 (*)    Glucose, Bld 110 (*)    BUN 25 (*)    Albumin 3.3 (*)    AST 42 (*)    GFR calc non Af Amer 59 (*)    All other components within normal limits  BRAIN NATRIURETIC PEPTIDE - Abnormal; Notable for the following:    B Natriuretic Peptide 160.0 (*)    All  other components within normal limits  CBC WITH DIFFERENTIAL/PLATELET - Abnormal; Notable for the following:    RBC 3.55 (*)    MCV 101.9 (*)    MCH 34.1 (*)    Monocytes Absolute 1.3 (*)    All other components within normal limits  BLOOD GAS, VENOUS - Abnormal; Notable for the following:    Bicarbonate 35.2 (*)    Acid-Base Excess 9.7 (*)    All other components within normal limits  TROPONIN I - Abnormal; Notable for the following:    Troponin I 0.03 (*)    All other components within normal limits   ____________________________________________  [facility] read and interpreted by me shows A. fib at a rate of 94 normal axis no acute ST-T wave changes there is very bad baseline makes it slightly hard to interpret ____________________________________________  RADIOLOGY  Study Result   CLINICAL DATA:  Respiratory distress.  Cough.  Wheezing.  Rales.  EXAM: CHEST  2 VIEW  COMPARISON:  09/22/2016  FINDINGS: Stable mild cardiomegaly. No evidence of pulmonary infiltrate or edema. No evidence of pneumothorax or pleural effusion.  IMPRESSION: Stable mild cardiomegaly.  No active lung disease.   Electronically Signed   By: Myles Rosenthal M.D.   On: 10/27/2016 15:28    Study  Result   CLINICAL DATA:  Shortness of breath with wheezing  EXAM: CT ANGIOGRAPHY CHEST WITH CONTRAST  TECHNIQUE: Multidetector CT imaging of the chest was performed using the standard protocol during bolus administration of intravenous contrast. Multiplanar CT image reconstructions and MIPs were obtained to evaluate the vascular anatomy.  CONTRAST:  75 mL Isovue 370 intravenous  COMPARISON:  10/27/2016, CT 09/22/2016  FINDINGS: Cardiovascular: Satisfactory opacification of the pulmonary arteries to the segmental level. The examination is compromised by respiratory motion artifact. No definitive central acute pulmonary embolus is visualized. Non aneurysmal aorta. Atherosclerotic calcifications and mural thrombus. Mild cardiomegaly. No significant pericardial effusion.  Mediastinum/Nodes: Esophagus within normal limits. No significantly enlarged mediastinal lymph nodes. Midline trachea.  Lungs/Pleura: Scattered ground-glass densities could relate to diffuse atelectasis or mild edema. No focal consolidation. No pleural effusion. Minimal bronchial thickening within the bilateral lung bases.  Upper Abdomen: No acute abnormality.  Musculoskeletal: Degenerative changes. No acute or suspicious bone lesion.  Review of the MIP images confirms the above findings.  IMPRESSION: 1. Motion degradation limits the exam. No definitive pulmonary embolus is visualized. 2. Scattered bilateral hazy densities could reflect diffuse atelectasis or mild edema. There is mild cardiomegaly. 3. Mild bronchial wall thickening within the bilateral lung bases suggesting bronchial inflammation or infection.   Electronically Signed   By: Jasmine Pang M.D.   On: 10/27/2016 21:01    ____________________________________________   PROCEDURES  Procedure(s) performed:   Procedures  Critical Care performed:   ____________________________________________   INITIAL IMPRESSION /  ASSESSMENT AND PLAN / ED COURSE  Pertinent labs & imaging results that were available during my care of the patient were reviewed by me and considered in my medical decision making (see chart for details).  BNP reported by lab as 160   O2 sat goes down to 88 with as much exercise I can get her to do sitting up in the bed.   ____________________________________________   FINAL CLINICAL IMPRESSION(S) / ED DIAGNOSES  Final diagnoses:  Respiratory distress  Congestive heart failure, unspecified HF chronicity, unspecified heart failure type (HCC)  Healthcare-associated pneumonia      NEW MEDICATIONS STARTED DURING THIS VISIT:  New Prescriptions   No medications on  file     Note:  This document was prepared using Dragon voice recognition software and may include unintentional dictation errors.    Arnaldo Natal, MD 10/27/16 2134

## 2016-10-27 NOTE — ED Triage Notes (Signed)
Pt presents to ED from [facility] via [facility] for respiratory distress with audible wheezing/rales/ Pt received IV s125 mg solumedrol and 2 duonebs prior to arrival

## 2016-10-28 DIAGNOSIS — R0603 Acute respiratory distress: Secondary | ICD-10-CM

## 2016-10-28 DIAGNOSIS — I4891 Unspecified atrial fibrillation: Secondary | ICD-10-CM

## 2016-10-28 DIAGNOSIS — I5043 Acute on chronic combined systolic (congestive) and diastolic (congestive) heart failure: Secondary | ICD-10-CM

## 2016-10-28 LAB — BLOOD CULTURE ID PANEL (REFLEXED)
Acinetobacter baumannii: NOT DETECTED
CANDIDA TROPICALIS: NOT DETECTED
Candida albicans: NOT DETECTED
Candida glabrata: NOT DETECTED
Candida krusei: NOT DETECTED
Candida parapsilosis: NOT DETECTED
Enterobacter cloacae complex: NOT DETECTED
Enterobacteriaceae species: NOT DETECTED
Enterococcus species: NOT DETECTED
Escherichia coli: NOT DETECTED
HAEMOPHILUS INFLUENZAE: NOT DETECTED
KLEBSIELLA PNEUMONIAE: NOT DETECTED
Klebsiella oxytoca: NOT DETECTED
Listeria monocytogenes: NOT DETECTED
METHICILLIN RESISTANCE: DETECTED — AB
NEISSERIA MENINGITIDIS: NOT DETECTED
PROTEUS SPECIES: NOT DETECTED
Pseudomonas aeruginosa: NOT DETECTED
STAPHYLOCOCCUS SPECIES: DETECTED — AB
STREPTOCOCCUS PYOGENES: NOT DETECTED
STREPTOCOCCUS SPECIES: NOT DETECTED
Serratia marcescens: NOT DETECTED
Staphylococcus aureus (BCID): DETECTED — AB
Streptococcus agalactiae: NOT DETECTED
Streptococcus pneumoniae: NOT DETECTED

## 2016-10-28 LAB — CBC
HCT: 34.4 % — ABNORMAL LOW (ref 35.0–47.0)
Hemoglobin: 11.5 g/dL — ABNORMAL LOW (ref 12.0–16.0)
MCH: 33.9 pg (ref 26.0–34.0)
MCHC: 33.5 g/dL (ref 32.0–36.0)
MCV: 101.4 fL — ABNORMAL HIGH (ref 80.0–100.0)
Platelets: 247 10*3/uL (ref 150–440)
RBC: 3.39 MIL/uL — ABNORMAL LOW (ref 3.80–5.20)
RDW: 13.3 % (ref 11.5–14.5)
WBC: 9 10*3/uL (ref 3.6–11.0)

## 2016-10-28 LAB — BASIC METABOLIC PANEL
Anion gap: 9 (ref 5–15)
BUN: 23 mg/dL — ABNORMAL HIGH (ref 6–20)
CALCIUM: 8.9 mg/dL (ref 8.9–10.3)
CO2: 29 mmol/L (ref 22–32)
CREATININE: 1 mg/dL (ref 0.44–1.00)
Chloride: 103 mmol/L (ref 101–111)
GFR calc non Af Amer: 50 mL/min — ABNORMAL LOW (ref >60)
GFR, EST AFRICAN AMERICAN: 57 mL/min — AB (ref >60)
GLUCOSE: 130 mg/dL — AB (ref 65–99)
Potassium: 4.1 mmol/L (ref 3.5–5.1)
Sodium: 141 mmol/L (ref 135–145)

## 2016-10-28 LAB — MRSA PCR SCREENING: MRSA BY PCR: POSITIVE — AB

## 2016-10-28 LAB — BLOOD GAS, VENOUS
Acid-Base Excess: 9.7 mmol/L — ABNORMAL HIGH (ref 0.0–2.0)
BICARBONATE: 35.2 mmol/L — AB (ref 20.0–28.0)
O2 SAT: 67.6 %
PCO2 VEN: 53 mmHg (ref 44.0–60.0)
PO2 VEN: 34 mmHg (ref 32.0–45.0)
Patient temperature: 37
pH, Ven: 7.43 (ref 7.250–7.430)

## 2016-10-28 LAB — TROPONIN I: Troponin I: <0.03 ng/mL (ref <0.03)

## 2016-10-28 MED ORDER — SODIUM CHLORIDE 0.9% FLUSH
3.0000 mL | Freq: Two times a day (BID) | INTRAVENOUS | Status: DC
  Administered 2016-10-28 – 2016-11-01 (×10): 3 mL via INTRAVENOUS

## 2016-10-28 MED ORDER — LEVOTHYROXINE SODIUM 50 MCG PO TABS: 50.0000 ug | ORAL_TABLET | Freq: Every evening | ORAL | Status: DC

## 2016-10-28 MED ORDER — PAROXETINE HCL 20 MG PO TABS
20.0000 mg | ORAL_TABLET | ORAL | Status: DC
  Administered 2016-10-29 – 2016-10-31 (×2): 20 mg via ORAL
  Filled 2016-10-28 (×2): qty 1

## 2016-10-28 MED ORDER — PAROXETINE HCL 20 MG PO TABS: 20.0000 mg | ORAL_TABLET | ORAL | Status: DC

## 2016-10-28 MED ORDER — SENNOSIDES-DOCUSATE SODIUM 8.6-50 MG PO TABS: 1.0000 | ORAL_TABLET | Freq: Every evening | ORAL | Status: DC | PRN

## 2016-10-28 MED ORDER — SODIUM CHLORIDE 0.9 % IV SOLN: 250.0000 mL | INTRAVENOUS | Status: DC | PRN

## 2016-10-28 MED ORDER — FUROSEMIDE 10 MG/ML IJ SOLN
20.0000 mg | Freq: Every day | INTRAMUSCULAR | Status: DC
  Administered 2016-10-28 – 2016-10-29 (×2): 20 mg via INTRAVENOUS
  Filled 2016-10-28 (×2): qty 2

## 2016-10-28 MED ORDER — FERROUS SULFATE 325 (65 FE) MG PO TABS
325.0000 mg | ORAL_TABLET | Freq: Every day | ORAL | Status: DC
  Administered 2016-10-28 – 2016-11-01 (×5): 325 mg via ORAL
  Filled 2016-10-28 (×5): qty 1

## 2016-10-28 MED ORDER — IPRATROPIUM BROMIDE 0.02 % IN SOLN
0.5000 mg | Freq: Four times a day (QID) | RESPIRATORY_TRACT | Status: DC
  Administered 2016-10-28 – 2016-11-01 (×16): 0.5 mg via RESPIRATORY_TRACT
  Filled 2016-10-28 (×16): qty 2.5

## 2016-10-28 MED ORDER — IPRATROPIUM-ALBUTEROL 0.5-2.5 (3) MG/3ML IN SOLN
3.0000 mL | Freq: Four times a day (QID) | RESPIRATORY_TRACT | Status: DC
  Administered 2016-10-28 (×2): 3 mL via RESPIRATORY_TRACT
  Filled 2016-10-28 (×2): qty 3

## 2016-10-28 MED ORDER — ASPIRIN EC 81 MG PO TBEC
81.0000 mg | DELAYED_RELEASE_TABLET | Freq: Every day | ORAL | Status: DC
  Administered 2016-10-28: 81 mg via ORAL
  Filled 2016-10-28: qty 1

## 2016-10-28 MED ORDER — LEVOTHYROXINE SODIUM 50 MCG PO TABS
50.0000 ug | ORAL_TABLET | Freq: Every day | ORAL | Status: DC
  Administered 2016-10-29 – 2016-11-01 (×4): 50 ug via ORAL
  Filled 2016-10-28 (×5): qty 1

## 2016-10-28 MED ORDER — PAROXETINE HCL 20 MG PO TABS
10.0000 mg | ORAL_TABLET | ORAL | Status: DC
  Administered 2016-10-28 – 2016-11-01 (×3): 10 mg via ORAL
  Filled 2016-10-28 (×3): qty 1

## 2016-10-28 MED ORDER — LEVALBUTEROL HCL 0.63 MG/3ML IN NEBU
0.6300 mg | INHALATION_SOLUTION | Freq: Four times a day (QID) | RESPIRATORY_TRACT | Status: DC
  Administered 2016-10-28 – 2016-11-01 (×16): 0.63 mg via RESPIRATORY_TRACT
  Filled 2016-10-28 (×16): qty 3

## 2016-10-28 MED ORDER — METOPROLOL TARTRATE 50 MG PO TABS
50.0000 mg | ORAL_TABLET | Freq: Two times a day (BID) | ORAL | Status: DC
  Administered 2016-10-28 (×3): 50 mg via ORAL
  Filled 2016-10-28 (×3): qty 1

## 2016-10-28 MED ORDER — ACETAMINOPHEN 325 MG PO TABS: 650.0000 mg | ORAL_TABLET | Freq: Four times a day (QID) | ORAL | Status: DC | PRN

## 2016-10-28 MED ORDER — ADULT MULTIVITAMIN W/MINERALS CH
1.0000 | ORAL_TABLET | Freq: Every day | ORAL | Status: DC
  Administered 2016-10-28 – 2016-11-01 (×5): 1 via ORAL
  Filled 2016-10-28 (×5): qty 1

## 2016-10-28 MED ORDER — SODIUM CHLORIDE 0.9% FLUSH: 3.0000 mL | Freq: Two times a day (BID) | INTRAVENOUS | Status: DC

## 2016-10-28 MED ORDER — ACETAMINOPHEN 650 MG RE SUPP: 650.0000 mg | Freq: Four times a day (QID) | RECTAL | Status: DC | PRN

## 2016-10-28 MED ORDER — SODIUM CHLORIDE 0.9% FLUSH
3.0000 mL | INTRAVENOUS | Status: DC | PRN
  Administered 2016-10-29: 3 mL via INTRAVENOUS
  Filled 2016-10-28: qty 3

## 2016-10-28 MED ORDER — APIXABAN 5 MG PO TABS
5.0000 mg | ORAL_TABLET | Freq: Two times a day (BID) | ORAL | Status: DC
  Administered 2016-10-28 – 2016-11-01 (×9): 5 mg via ORAL
  Filled 2016-10-28 (×10): qty 1

## 2016-10-28 MED ORDER — VANCOMYCIN HCL IN DEXTROSE 1-5 GM/200ML-% IV SOLN
1000.0000 mg | INTRAVENOUS | Status: DC
  Administered 2016-10-29 – 2016-11-01 (×4): 1000 mg via INTRAVENOUS
  Filled 2016-10-28 (×5): qty 200

## 2016-10-28 MED ORDER — POTASSIUM CHLORIDE CRYS ER 20 MEQ PO TBCR
40.0000 meq | EXTENDED_RELEASE_TABLET | Freq: Two times a day (BID) | ORAL | Status: DC
  Administered 2016-10-28 – 2016-11-01 (×8): 40 meq via ORAL
  Filled 2016-10-28 (×9): qty 2

## 2016-10-28 MED ORDER — VANCOMYCIN HCL IN DEXTROSE 1-5 GM/200ML-% IV SOLN
1000.0000 mg | Freq: Once | INTRAVENOUS | Status: AC
  Administered 2016-10-28: 1000 mg via INTRAVENOUS
  Filled 2016-10-28 (×2): qty 200

## 2016-10-28 MED ORDER — ONDANSETRON HCL 4 MG PO TABS
4.0000 mg | ORAL_TABLET | Freq: Four times a day (QID) | ORAL | Status: DC | PRN
  Filled 2016-10-28: qty 1

## 2016-10-28 MED ORDER — ONDANSETRON HCL 4 MG/2ML IJ SOLN: 4.0000 mg | Freq: Four times a day (QID) | INTRAMUSCULAR | Status: DC | PRN

## 2016-10-28 NOTE — H&P (Signed)
[facility] - [place] at [facility]   PATIENT NAME: [name]    MR#:  [id number]  DATE OF BIRTH:  Jul 02, 1928  DATE OF ADMISSION:  10/27/2016  PRIMARY CARE PHYSICIAN: Tillman Abide, MD   REQUESTING/REFERRING PHYSICIAN:   CHIEF COMPLAINT:   Chief Complaint  Patient presents with  . Respiratory Distress    HISTORY OF PRESENT ILLNESS: [name]  is a 83 y.o. female with a known history of Chronic systolic heart failure, healthcare associated pneumonia in the past, hypertension, hyperlipidemia, Parkinson's disease, atrial fibrillation on liquids for antecolic patient was recently discharged on 09/26/2016 to [facility] facility after being treated at our hospital for healthcare associated pneumonia and systolic heart failure. Patient presented again today from the facility for increased shortness of breath and congestion. Per the family member she appeared more congested and wheezy. Patient is awake and has some occasional cough. She is on pured diet at the [facility] facility. She was evaluated in the emergency room and had a CT angiogram of the chest showed vascular congestion and edema in the lungs. No evidence of any pulmonary embolism. Patient completed course of antibiotics during last admission. Patient is awake but not completely oriented to time place and person. Opens eyes and responds to verbal commands by nodding the head. Hospitalist service was consulted for further care of the patient.  PAST MEDICAL HISTORY:   Past Medical History:  Diagnosis Date  . Anxiety disorder   . Arthritis   . Chronic systolic CHF (congestive heart failure) (HCC)    a. echo 07/2014: EF 45-50%, dilated LA at 4.5 cm, mildly dilated RA, mod-sev MR, mod TR, mildly elevated PASP @ 45.2  . Cognitive impairment   . Depression   . Gait instability   . HTN (hypertension)   . Hyperlipidemia   . Panic attacks   . Parkinson's disease (HCC)   . Persistent atrial fibrillation (HCC)     a. Eliquis 5 mg bid; b. CHADS2VASc = 5 (CHF, HTN, age x 2, female)    PAST SURGICAL HISTORY: Past Surgical History:  Procedure Laterality Date  . EYE SURGERY      SOCIAL HISTORY:  Social History  Substance Use Topics  . Smoking status: Never Smoker  . Smokeless tobacco: Never Used  . Alcohol use No    FAMILY HISTORY:  Family History  Problem Relation Age of Onset  . Heart disease Sister     DRUG ALLERGIES:  Allergies  Allergen Reactions  . Aliskiren-Hydrochlorothiazide Other (See Comments)    Reaction: unknown  . Amlodipine Other (See Comments)    Reaction: unknown  . Sulfa Antibiotics Swelling    REVIEW OF SYSTEMS:  Could not be obtained as patient is lethargic and confused  MEDICATIONS AT HOME:  Prior to Admission medications   Medication Sig Start Date End Date Taking? Authorizing Provider  acetaminophen (TYLENOL) 325 MG tablet Take 650 mg by mouth every 4 (four) hours as needed for mild pain, moderate pain or fever.    Yes Historical Provider, MD  apixaban (ELIQUIS) 5 MG TABS tablet Take 1 tablet (5 mg total) by mouth 2 (two) times daily. 08/16/15  Yes Adrian Saran, MD  ferrous sulfate 325 (65 FE) MG EC tablet Take 325 mg by mouth daily.   Yes Historical Provider, MD  fexofenadine (ALLEGRA) 180 MG tablet Take 180 mg by mouth daily.   Yes Historical Provider, MD  furosemide (LASIX) 40 MG tablet Take 20-40 mg by mouth 2 (two)  times daily. Take  qam and  @ 1300   Yes Historical Provider, MD  ipratropium-albuterol (DUONEB) 0.5-2.5 (3) MG/3ML SOLN Take 3 mLs by nebulization 2 (two) times daily. Patient taking differently: Take 3 mLs by nebulization 3 (three) times daily as needed.  12/17/15  Yes Emily Filbert, MD  levothyroxine (SYNTHROID, LEVOTHROID) 50 MCG tablet Take 50 mcg by mouth every evening.    Yes Historical Provider, MD  metoprolol (LOPRESSOR) 50 MG tablet Take 50 mg by mouth 2 (two) times daily.  07/18/14  Yes Historical Provider, MD  Multiple  Vitamin (MULTIVITAMIN) tablet Take 1 tablet by mouth daily.   Yes Historical Provider, MD  Nutritional Supplements (ARGINAID EXTRA) LIQD Take 1 Dose by mouth 2 (two) times daily.   Yes Historical Provider, MD  PARoxetine (PAXIL) 10 MG tablet Take 10 mg by mouth every Monday, Wednesday, and Friday.    Yes Historical Provider, MD  PARoxetine (PAXIL) 20 MG tablet Take 20 mg by mouth See admin instructions. Every tues, thurs, sat, sun   Yes Historical Provider, MD  potassium chloride SA (K-DUR,KLOR-CON) 20 MEQ tablet Take 40 mEq by mouth 2 (two) times daily.    Yes Historical Provider, MD      PHYSICAL EXAMINATION:   VITAL SIGNS: Blood pressure 114/81, pulse 96, temperature 98 F (36.7 C), temperature source Oral, resp. rate 16, SpO2 96 %.  GENERAL:  83 y.o.-year-old patient lying in the bed on oxygen via nasal canula EYES: Pupils equal, round, reactive to light and accommodation. No scleral icterus. Extraocular muscles intact.  HEENT: Head atraumatic, normocephalic. Oropharynx and nasopharynx clear.  NECK:  Supple, no jugular venous distention. No thyroid enlargement, no tenderness.  LUNGS: Decreased breath sounds bilaterally, basal crepitations heard. No use of accessory muscles of respiration.  CARDIOVASCULAR: S1, S2 irregular. No murmurs, rubs, or gallops.  ABDOMEN: Soft, nontender, nondistended. Bowel sounds present. No organomegaly or mass.  EXTREMITIES: No pedal edema, cyanosis, or clubbing.  NEUROLOGIC: opens eyes to verbal commands, moves all extremities. Gait not checked.  PSYCHIATRIC: could not be assesed SKIN: No obvious rash, lesion, or ulcer.   LABORATORY PANEL:   CBC  Recent Labs Lab 10/27/16 1513  WBC 9.3  HGB 12.1  HCT 36.1  PLT 290  MCV 101.9*  MCH 34.1*  MCHC 33.5  RDW 13.3  LYMPHSABS 1.8  MONOABS 1.3*  EOSABS 0.2  BASOSABS 0.0   ------------------------------------------------------------------------------------------------------------------  Chemistries    Recent Labs Lab 10/27/16 1510  NA 142  K 3.6  CL 101  CO2 33*  GLUCOSE 110*  BUN 25*  CREATININE 0.86  CALCIUM 8.9  AST 42*  ALT 33  ALKPHOS 72  BILITOT 0.8   ------------------------------------------------------------------------------------------------------------------ CrCl cannot be calculated (Unknown ideal weight.). ------------------------------------------------------------------------------------------------------------------ No results for input(s): TSH, T4TOTAL, T3FREE, THYROIDAB in the last 72 hours.  Invalid input(s): FREET3   Coagulation profile No results for input(s): INR, PROTIME in the last 168 hours. ------------------------------------------------------------------------------------------------------------------- No results for input(s): DDIMER in the last 72 hours. -------------------------------------------------------------------------------------------------------------------  Cardiac Enzymes  Recent Labs Lab 10/27/16 1510 10/27/16 1731  TROPONINI 0.03* 0.03*   ------------------------------------------------------------------------------------------------------------------ Invalid input(s): POCBNP  ---------------------------------------------------------------------------------------------------------------  Urinalysis    Component Value Date/Time   COLORURINE YELLOW (A) 09/22/2016 1454   APPEARANCEUR HAZY (A) 09/22/2016 1454   APPEARANCEUR Clear 07/14/2014 2215   LABSPEC 1.012 09/22/2016 1454   LABSPEC 1.005 07/14/2014 2215   PHURINE 5.0 09/22/2016 1454   GLUCOSEU NEGATIVE 09/22/2016 1454   GLUCOSEU Negative 07/14/2014 2215   HGBUR NEGATIVE 09/22/2016  1454   BILIRUBINUR NEGATIVE 09/22/2016 1454   BILIRUBINUR Negative 07/14/2014 2215   KETONESUR NEGATIVE 09/22/2016 1454   PROTEINUR NEGATIVE 09/22/2016 1454   NITRITE NEGATIVE 09/22/2016 1454   LEUKOCYTESUR TRACE (A) 09/22/2016 1454   LEUKOCYTESUR Negative 07/14/2014 2215      RADIOLOGY: Dg Chest 2 View  Result Date: 10/27/2016 CLINICAL DATA:  Respiratory distress.  Cough.  Wheezing.  Rales. EXAM: CHEST  2 VIEW COMPARISON:  09/22/2016 FINDINGS: Stable mild cardiomegaly. No evidence of pulmonary infiltrate or edema. No evidence of pneumothorax or pleural effusion. IMPRESSION: Stable mild cardiomegaly.  No active lung disease. Electronically Signed   By: Myles Rosenthal M.D.   On: 10/27/2016 15:28   Ct Angio Chest Pe W And/or Wo Contrast  Result Date: 10/27/2016 CLINICAL DATA:  Shortness of breath with wheezing EXAM: CT ANGIOGRAPHY CHEST WITH CONTRAST TECHNIQUE: Multidetector CT imaging of the chest was performed using the standard protocol during bolus administration of intravenous contrast. Multiplanar CT image reconstructions and MIPs were obtained to evaluate the vascular anatomy. CONTRAST:  75 mL Isovue 370 intravenous COMPARISON:  10/27/2016, CT 09/22/2016 FINDINGS: Cardiovascular: Satisfactory opacification of the pulmonary arteries to the segmental level. The examination is compromised by respiratory motion artifact. No definitive central acute pulmonary embolus is visualized. Non aneurysmal aorta. Atherosclerotic calcifications and mural thrombus. Mild cardiomegaly. No significant pericardial effusion. Mediastinum/Nodes: Esophagus within normal limits. No significantly enlarged mediastinal lymph nodes. Midline trachea. Lungs/Pleura: Scattered ground-glass densities could relate to diffuse atelectasis or mild edema. No focal consolidation. No pleural effusion. Minimal bronchial thickening within the bilateral lung bases. Upper Abdomen: No acute abnormality. Musculoskeletal: Degenerative changes. No acute or suspicious bone lesion. Review of the MIP images confirms the above findings. IMPRESSION: 1. Motion degradation limits the exam. No definitive pulmonary embolus is visualized. 2. Scattered bilateral hazy densities could reflect diffuse atelectasis or mild edema. There  is mild cardiomegaly. 3. Mild bronchial wall thickening within the bilateral lung bases suggesting bronchial inflammation or infection. Electronically Signed   By: Jasmine Pang M.D.   On: 10/27/2016 21:01    EKG: Orders placed or performed during the hospital encounter of 10/27/16  . ED EKG within 10 minutes  . ED EKG within 10 minutes    IMPRESSION AND PLAN: 83 year old elderly female patient with history of chronic atrial fibrillation, systolic heart failure, hypertension, hyperlipidemia, Parkinson's disease presented to the emergency room with condition and difficulty breathing. Admitting diagnosis 1. Acute on chronic heart failure 2. Acute on chronic respiratory failure 3. Aspiration pneumonitis 4. Hypertension 5. Chronic atrial fibrillation 6. Parkinson disease Treatment plan Admit patient to telemetry Diurese patient with IV Lasix 40 MG daily IV clindamycin antibiotic Oxygen via nasal cannula Resume eliquis for anticoagulation Supportive care obtain old echocardiogram report.  All the records are reviewed and case discussed with ED provider. Management plans discussed with the patient, family and they are in agreement.  CODE STATUS:DNR Code Status History    Date Active Date Inactive Code Status Order ID Comments User Context   09/22/2016  5:57 PM 09/26/2016  8:06 PM DNR [id number]  Shaune Pollack, MD ED   09/18/2015  3:39 AM 09/20/2015  1:35 PM DNR [id number]  Ihor Austin, MD ED   08/13/2015 12:27 PM 08/16/2015  5:54 PM DNR [id number]  Milagros Loll, MD ED    Questions for Most Recent Historical Code Status (Order [id number])    Question Answer Comment   In the event of cardiac or respiratory ARREST Do not call a "code blue"  In the event of cardiac or respiratory ARREST Do not perform Intubation, CPR, defibrillation or ACLS    In the event of cardiac or respiratory ARREST Use medication by any route, position, wound care, and other measures to relive pain and suffering. May use  oxygen, suction and manual treatment of airway obstruction as needed for comfort.        TOTAL TIME TAKING CARE OF THIS PATIENT: 51 minutes.    Ihor Austin M.D on 10/28/2016 at 12:03 AM  Between 7am to 6pm - Pager - [phone]  After 6pm go to [email or web address] - password [email or web address] [facility]  [place]  Office  [phone]  CC: Primary care physician; Tillman Abide, MD

## 2016-10-28 NOTE — Progress Notes (Signed)
Pt. Is positive to MRSA. Contact precaution implemented

## 2016-10-28 NOTE — Progress Notes (Signed)
PHARMACY - PHYSICIAN COMMUNICATION CRITICAL VALUE ALERT - BLOOD CULTURE IDENTIFICATION (BCID)  Results for orders placed or performed during the hospital encounter of 10/27/16  Blood Culture ID Panel (Reflexed) (Collected: 10/27/2016  3:10 PM)  Result Value Ref Range   Enterococcus species NOT DETECTED NOT DETECTED   Listeria monocytogenes NOT DETECTED NOT DETECTED   Staphylococcus species DETECTED (A) NOT DETECTED   Staphylococcus aureus DETECTED (A) NOT DETECTED   Methicillin resistance DETECTED (A) NOT DETECTED   Streptococcus species NOT DETECTED NOT DETECTED   Streptococcus agalactiae NOT DETECTED NOT DETECTED   Streptococcus pneumoniae NOT DETECTED NOT DETECTED   Streptococcus pyogenes NOT DETECTED NOT DETECTED   Acinetobacter baumannii NOT DETECTED NOT DETECTED   Enterobacteriaceae species NOT DETECTED NOT DETECTED   Enterobacter cloacae complex NOT DETECTED NOT DETECTED   Escherichia coli NOT DETECTED NOT DETECTED   Klebsiella oxytoca NOT DETECTED NOT DETECTED   Klebsiella pneumoniae NOT DETECTED NOT DETECTED   Proteus species NOT DETECTED NOT DETECTED   Serratia marcescens NOT DETECTED NOT DETECTED   Haemophilus influenzae NOT DETECTED NOT DETECTED   Neisseria meningitidis NOT DETECTED NOT DETECTED   Pseudomonas aeruginosa NOT DETECTED NOT DETECTED   Candida albicans NOT DETECTED NOT DETECTED   Candida glabrata NOT DETECTED NOT DETECTED   Candida krusei NOT DETECTED NOT DETECTED   Candida parapsilosis NOT DETECTED NOT DETECTED   Candida tropicalis NOT DETECTED NOT DETECTED    Name of physician (or Provider) Contacted: Chen  Changes to prescribed antibiotics required: Yes, will start Vancomycin, pharmacy to dose.   Kealey Kemmer D 10/28/2016  6:24 PM

## 2016-10-28 NOTE — Progress Notes (Signed)
SATURATION QUALIFICATIONS: (This note is used to comply with regulatory documentation for home oxygen)  Patient Saturations on Room Air at Rest = 81%  Patient Saturations on Room Air while Ambulating = n/a%  Patient Saturations on n/a Liters of oxygen while Ambulating = n/a%  Please briefly explain why patient needs home oxygen: Desats quickly on room air at rest.

## 2016-10-28 NOTE — Progress Notes (Addendum)
[facility] - [place] at [facility]   PATIENT NAME: [name]    MR#:  [id number]  DATE OF BIRTH:  Nov 02, 1928  SUBJECTIVE:  Came in with increasing SOB  dter in the room No fever  REVIEW OF SYSTEMS:   Review of Systems  Unable to perform ROS: Mental status change   Tolerating Diet: Tolerating PT:   DRUG ALLERGIES:   Allergies  Allergen Reactions  . Aliskiren-Hydrochlorothiazide Other (See Comments)    Reaction: unknown  . Amlodipine Other (See Comments)    Reaction: unknown  . Sulfa Antibiotics Swelling    VITALS:  Blood pressure 136/71, pulse (!) 113, temperature 97.2 F (36.2 C), temperature source Axillary, resp. rate 14, height  (1.651 m), weight 71.2 kg (156 lb 14.4 oz), SpO2 98 %.  PHYSICAL EXAMINATION:   Physical Exam  GENERAL:  83 y.o.-year-old patient lying in the bed with no acute distress. overweight EYES: Pupils equal, round, reactive to light and accommodation. No scleral icterus. Extraocular muscles intact.  HEENT: Head atraumatic, normocephalic. Oropharynx and nasopharynx clear.  NECK:  Supple, no jugular venous distention. No thyroid enlargement, no tenderness.  LUNGS: Normal breath sounds bilaterally, no wheezing, rales, rhonchi. No use of accessory muscles of respiration.  CARDIOVASCULAR: S1, S2 normal. No murmurs, rubs, or gallops.  ABDOMEN: Soft, nontender, nondistended. Bowel sounds present. No organomegaly or mass.  EXTREMITIES: No cyanosis, clubbing or edema b/l.    NEUROLOGIC: Cranial nerves II through XII are intact. No focal Motor or sensory deficits b/l.   PSYCHIATRIC:  patient is alert and oriented x 3.  SKIN: No obvious rash, lesion, or ulcer.   LABORATORY PANEL:  CBC  Recent Labs Lab 10/28/16 0725  WBC 9.0  HGB 11.5*  HCT 34.4*  PLT 247    Chemistries   Recent Labs Lab 10/27/16 1510 10/28/16 0725  NA 142 141  K 3.6 4.1  CL 101 103  CO2 33* 29  GLUCOSE 110* 130*  BUN 25* 23*   CREATININE 0.86 1.00  CALCIUM 8.9 8.9  AST 42*  --   ALT 33  --   ALKPHOS 72  --   BILITOT 0.8  --    Cardiac Enzymes  Recent Labs Lab 10/28/16 1324  TROPONINI <0.03   RADIOLOGY:  Dg Chest 2 View  Result Date: 10/27/2016 CLINICAL DATA:  Respiratory distress.  Cough.  Wheezing.  Rales. EXAM: CHEST  2 VIEW COMPARISON:  09/22/2016 FINDINGS: Stable mild cardiomegaly. No evidence of pulmonary infiltrate or edema. No evidence of pneumothorax or pleural effusion. IMPRESSION: Stable mild cardiomegaly.  No active lung disease. Electronically Signed   By: Myles Rosenthal M.D.   On: 10/27/2016 15:28   Ct Angio Chest Pe W And/or Wo Contrast  Result Date: 10/27/2016 CLINICAL DATA:  Shortness of breath with wheezing EXAM: CT ANGIOGRAPHY CHEST WITH CONTRAST TECHNIQUE: Multidetector CT imaging of the chest was performed using the standard protocol during bolus administration of intravenous contrast. Multiplanar CT image reconstructions and MIPs were obtained to evaluate the vascular anatomy. CONTRAST:  75 mL Isovue 370 intravenous COMPARISON:  10/27/2016, CT 09/22/2016 FINDINGS: Cardiovascular: Satisfactory opacification of the pulmonary arteries to the segmental level. The examination is compromised by respiratory motion artifact. No definitive central acute pulmonary embolus is visualized. Non aneurysmal aorta. Atherosclerotic calcifications and mural thrombus. Mild cardiomegaly. No significant pericardial effusion. Mediastinum/Nodes: Esophagus within normal limits. No significantly enlarged mediastinal lymph nodes. Midline trachea. Lungs/Pleura: Scattered ground-glass densities could relate to diffuse atelectasis or mild edema.  No focal consolidation. No pleural effusion. Minimal bronchial thickening within the bilateral lung bases. Upper Abdomen: No acute abnormality. Musculoskeletal: Degenerative changes. No acute or suspicious bone lesion. Review of the MIP images confirms the above findings. IMPRESSION: 1.  Motion degradation limits the exam. No definitive pulmonary embolus is visualized. 2. Scattered bilateral hazy densities could reflect diffuse atelectasis or mild edema. There is mild cardiomegaly. 3. Mild bronchial wall thickening within the bilateral lung bases suggesting bronchial inflammation or infection. Electronically Signed   By: Jasmine Pang M.D.   On: 10/27/2016 21:01   ASSESSMENT AND PLAN:  [name]  is a 83 y.o. female with a known history of Chronic systolic heart failure, healthcare associated pneumonia in the past, hypertension, hyperlipidemia, Parkinson's disease, atrial fibrillation on liquids for antecolic patient was recently discharged on 09/26/2016 to [facility] after being treated at our hospital for healthcare associated pneumonia and systolic heart failure. Patient presented again today from the facility for increased shortness of breath and congestion. Per the family member she appeared more congested and wheezy  1. Acute on chronicSystolic heart failure -Patient came in with increasing shortness of breath and weight gain at [facility]. sHe was recently admitted about 4 weeks ago and when discharged she was discharged on 20 mg of Lasix given her low pressure during the past admission. -Lasix 40 mg daily and 20 mg in the evening. -Sats are 100% on 2 L -Weaned to room air  2. Acute on chronic respiratory failure as above  3. Hypertension Continue home meds  4. Chronic atrial fibrillation Continue Toprol and patient's oral anticoagulation  5. Parkinson disease  6. Discharge planning patient is from [facility]. Heart baseline according to the daughter is wheelchair and jeri chair. She does not walk.  C is negative before discharge planning. Above was discussed with patient's daughter she appreciated be answering most of the questions. Case discussed with Care Management/Social Worker. Management plans discussed with the patient, family and they are in  agreement.  CODE STATUS: DNR  DVT Prophylaxis: eliquis  TOTAL TIME TAKING CARE OF THIS PATIENT: *40* minutes.  >50% time spent on counselling and coordination of care  POSSIBLE D/C IN  1-2 DAYS, DEPENDING ON CLINICAL CONDITION.  Note: This dictation was prepared with Dragon dictation along with smaller phrase technology. Any transcriptional errors that result from this process are unintentional.  Laryssa Hassing M.D on 10/28/2016 at 2:15 PM  Between 7am to 6pm - Pager - [phone]  After 6pm go to [email or web address] - Social research officer, government  [facility]  Office  [phone]  CC: Primary care physician; Tillman Abide, MD

## 2016-10-28 NOTE — Consult Note (Signed)
WOC Nurse wound consult note Reason for Consult: Chronic pressure injury to left heel.  Has fully epithelialized with 0.2 cm scabbed area that comes and goes.  Was seen at wound care center and now followed by NP, Leonard Schwartz for wound care at [facility] [place].  Wound type:Chronic pressure injury, resolved.   Pressure Injury POA: Yes Measurement:0.2 cm scab with 0.5 cm circumferential scar.  Wound ZOX:WRUE Drainage (amount, consistency, odor) None Periwound:intact Dressing procedure/placement/frequency:Will add prevalon boot to offload heel.  Daughter has been using bunny boots.  Discussed the different and that the prevalon boot would eliminate pressure points and was to be used while in bed.  Will not follow at this time.  Please re-consult if needed.  Maple Hudson RN BSN [facility]ON Pager [phone]

## 2016-10-28 NOTE — Progress Notes (Signed)
Pt. arrived via stretcher transferred to bed by staff. Sister at bedside. Tele verified by Claris Gower, RN and Trey Paula CNA. Skin assessed with Hansel Starling, RN. General room orientation given. Instruction on how to use phone and ascom system given. Pt. Bathed, linen changed snack given.

## 2016-10-28 NOTE — Consult Note (Signed)
Cardiology Consultation Note  Patient ID: [name], MRN: [id number], DOB/AGE: Jul 11, 1928 83 y.o. Admit date: 10/27/2016   Date of Consult: 10/28/2016 Primary Physician: Tillman Abide, MD Primary Cardiologist: Dr. Mariah Milling, MD Requesting Physician: Dr. Allena Katz, MD  Chief Complaint: SOB Reason for Consult: Acute on chronic combined CHF/Afib with RVR  HPI: [name] is a 83 y.o. female who is being seen today for the evaluation of increased SOB at the request of Dr. Allena Katz, MD. Patient has a h/o chronic atrial fibrillation on Eliquis, chronic systolic CHF, cognitive impairment, hypertension, gait instability now wheelchair-bound, Parkinson's disease, and anxiety/panic attack who was recently admitted to [facility] with multi-focal PNA returned to [facility] on 4/30 with a one-day history of increased SOB.   Patient was initially diagnosed with atrial fibrillation during hospital admission and 07/2014, located by acute systolic CHF. She was diuresed at that time. Echo during that admission showed EF 45-50%, dilated left atrium at 45 mm, dilated right atrium, moderate to severe mitral regurgitation, moderate tricuspid regurgitation, and mildly elevated right-sided pressure at 45.2 mmHg. Head CT during that admission showed chronic infarcts along the right frontal and temporal lobes with associated encephalomalacia. Given her CHADS2VASc of at least 6 she was started on Eliquis.   Admitted to [facility] in late March with acute respiratory failure 2/2 multi-focal PNA. Cardiology saw patient at that time for SOB which was felt to be infection in etiology. She was not felt to be volume overloaded at that time with a weight 159 pounds. After that admission she was discharged to [facility]. On 4/29 she developed increased SOB with coughing and wheezing. CTA chest in the ED was negative for PE, though did show some mild pulmonary edema with possible inflammation/bronchitis. She was started on IV ABX per IM along with IV diuresis.  Her heart rate has been tachycardic with rates from the 110s to 150s bpm in Afib with RVR. She has diuresed 300 mL for the admission. Weight is down 3 pounds from last admission at 156 pounds. She has been treated with Duonebs per IM. She remains on supplemental oxygen via nasal cannula. BNP minimally elevated at 160. Troponin negative. CXR with stable cardiomegaly. EKG as below.     Past Medical History:  Diagnosis Date  . Anxiety disorder   . Arthritis   . Chronic systolic CHF (congestive heart failure) (HCC)    a. echo 07/2014: EF 45-50%, dilated LA at 4.5 cm, mildly dilated RA, mod-sev MR, mod TR, mildly elevated PASP @ 45.2  . Cognitive impairment   . Depression   . Gait instability   . HTN (hypertension)   . Hyperlipidemia   . Panic attacks   . Parkinson's disease (HCC)   . Persistent atrial fibrillation (HCC)    a. Eliquis 5 mg bid; b. CHADS2VASc = 5 (CHF, HTN, age x 2, female)      Most Recent Cardiac Studies: As above   Surgical History:  Past Surgical History:  Procedure Laterality Date  . EYE SURGERY       Home Meds: Prior to Admission medications   Medication Sig Start Date End Date Taking? Authorizing Provider  acetaminophen (TYLENOL) 325 MG tablet Take 650 mg by mouth every 4 (four) hours as needed for mild pain, moderate pain or fever.    Yes Historical Provider, MD  apixaban (ELIQUIS) 5 MG TABS tablet Take 1 tablet (5 mg total) by mouth 2 (two) times daily. 08/16/15  Yes Adrian Saran, MD  ferrous sulfate 325 (65 FE)  MG EC tablet Take 325 mg by mouth daily.   Yes Historical Provider, MD  fexofenadine (ALLEGRA) 180 MG tablet Take 180 mg by mouth daily.   Yes Historical Provider, MD  furosemide (LASIX) 40 MG tablet Take 20-40 mg by mouth 2 (two) times daily. Take  qam and  @ 1300   Yes Historical Provider, MD  ipratropium-albuterol (DUONEB) 0.5-2.5 (3) MG/3ML SOLN Take 3 mLs by nebulization 2 (two) times daily. Patient taking differently: Take 3 mLs by  nebulization 3 (three) times daily as needed.  12/17/15  Yes Emily Filbert, MD  levothyroxine (SYNTHROID, LEVOTHROID) 50 MCG tablet Take 50 mcg by mouth every evening.    Yes Historical Provider, MD  metoprolol (LOPRESSOR) 50 MG tablet Take 50 mg by mouth 2 (two) times daily.  07/18/14  Yes Historical Provider, MD  Multiple Vitamin (MULTIVITAMIN) tablet Take 1 tablet by mouth daily.   Yes Historical Provider, MD  Nutritional Supplements (ARGINAID EXTRA) LIQD Take 1 Dose by mouth 2 (two) times daily.   Yes Historical Provider, MD  PARoxetine (PAXIL) 10 MG tablet Take 10 mg by mouth every Monday, Wednesday, and Friday.    Yes Historical Provider, MD  PARoxetine (PAXIL) 20 MG tablet Take 20 mg by mouth See admin instructions. Every tues, thurs, sat, sun   Yes Historical Provider, MD  potassium chloride SA (K-DUR,KLOR-CON) 20 MEQ tablet Take 40 mEq by mouth 2 (two) times daily.    Yes Historical Provider, MD    Inpatient Medications:  . apixaban  5 mg Oral BID  . aspirin EC  81 mg Oral Daily  . ferrous sulfate  325 mg Oral Daily  . furosemide  20 mg Intravenous QAC lunch  . furosemide  40 mg Intravenous Daily  . ipratropium-albuterol  3 mL Nebulization Q6H  . levothyroxine  50 mcg Oral QPM  . metoprolol  50 mg Oral BID  . multivitamin with minerals  1 tablet Oral Daily  . PARoxetine  10 mg Oral Q M,W,F  . [START ON 10/29/2016] PARoxetine  20 mg Oral Q T,Th,S,Su  . potassium chloride SA  40 mEq Oral BID  . sodium chloride flush  3 mL Intravenous Q12H   . sodium chloride      Allergies:  Allergies  Allergen Reactions  . Aliskiren-Hydrochlorothiazide Other (See Comments)    Reaction: unknown  . Amlodipine Other (See Comments)    Reaction: unknown  . Sulfa Antibiotics Swelling    Social History   Social History  . Marital status: Single    Spouse name: N/A  . Number of children: N/A  . Years of education: N/A   Occupational History  . retired    Social History Main Topics    . Smoking status: Never Smoker  . Smokeless tobacco: Never Used  . Alcohol use No  . Drug use: No  . Sexual activity: Not on file   Other Topics Concern  . Not on file   Social History Narrative  . No narrative on file     Family History  Problem Relation Age of Onset  . Heart disease Sister      Review of Systems: Review of Systems  Constitutional: Positive for malaise/fatigue. Negative for chills, diaphoresis, fever and weight loss.  HENT: Negative for congestion.   Eyes: Negative for discharge and redness.  Respiratory: Positive for cough, shortness of breath and wheezing. Negative for hemoptysis and sputum production.   Cardiovascular: Positive for palpitations. Negative for chest pain, orthopnea, claudication, leg  swelling and PND.  Gastrointestinal: Negative for abdominal pain, blood in stool, heartburn, melena, nausea and vomiting.  Genitourinary: Negative for hematuria.  Musculoskeletal: Negative for falls and myalgias.  Skin: Negative for rash.  Neurological: Positive for weakness. Negative for dizziness, tingling, tremors, sensory change, speech change, focal weakness and loss of consciousness.  Endo/Heme/Allergies: Does not bruise/bleed easily.  Psychiatric/Behavioral: Negative for substance abuse. The patient is not nervous/anxious.   All other systems reviewed and are negative.   Labs:  Recent Labs  10/27/16 1731 10/28/16 0128 10/28/16 0725 10/28/16 1324  TROPONINI 0.03* <0.03 <0.03 <0.03   Lab Results  Component Value Date   WBC 9.0 10/28/2016   HGB 11.5 (L) 10/28/2016   HCT 34.4 (L) 10/28/2016   MCV 101.4 (H) 10/28/2016   PLT 247 10/28/2016     Recent Labs Lab 10/27/16 1510 10/28/16 0725  NA 142 141  K 3.6 4.1  CL 101 103  CO2 33* 29  BUN 25* 23*  CREATININE 0.86 1.00  CALCIUM 8.9 8.9  PROT 7.1  --   BILITOT 0.8  --   ALKPHOS 72  --   ALT 33  --   AST 42*  --   GLUCOSE 110* 130*   No results found for: CHOL, HDL, LDLCALC,  TRIG No results found for: DDIMER  Radiology/Studies:  Dg Chest 2 View  Result Date: 10/27/2016 CLINICAL DATA:  Respiratory distress.  Cough.  Wheezing.  Rales. EXAM: CHEST  2 VIEW COMPARISON:  09/22/2016 FINDINGS: Stable mild cardiomegaly. No evidence of pulmonary infiltrate or edema. No evidence of pneumothorax or pleural effusion. IMPRESSION: Stable mild cardiomegaly.  No active lung disease. Electronically Signed   By: Myles Rosenthal M.D.   On: 10/27/2016 15:28   Ct Angio Chest Pe W And/or Wo Contrast  Result Date: 10/27/2016 CLINICAL DATA:  Shortness of breath with wheezing EXAM: CT ANGIOGRAPHY CHEST WITH CONTRAST TECHNIQUE: Multidetector CT imaging of the chest was performed using the standard protocol during bolus administration of intravenous contrast. Multiplanar CT image reconstructions and MIPs were obtained to evaluate the vascular anatomy. CONTRAST:  75 mL Isovue 370 intravenous COMPARISON:  10/27/2016, CT 09/22/2016 FINDINGS: Cardiovascular: Satisfactory opacification of the pulmonary arteries to the segmental level. The examination is compromised by respiratory motion artifact. No definitive central acute pulmonary embolus is visualized. Non aneurysmal aorta. Atherosclerotic calcifications and mural thrombus. Mild cardiomegaly. No significant pericardial effusion. Mediastinum/Nodes: Esophagus within normal limits. No significantly enlarged mediastinal lymph nodes. Midline trachea. Lungs/Pleura: Scattered ground-glass densities could relate to diffuse atelectasis or mild edema. No focal consolidation. No pleural effusion. Minimal bronchial thickening within the bilateral lung bases. Upper Abdomen: No acute abnormality. Musculoskeletal: Degenerative changes. No acute or suspicious bone lesion. Review of the MIP images confirms the above findings. IMPRESSION: 1. Motion degradation limits the exam. No definitive pulmonary embolus is visualized. 2. Scattered bilateral hazy densities could reflect  diffuse atelectasis or mild edema. There is mild cardiomegaly. 3. Mild bronchial wall thickening within the bilateral lung bases suggesting bronchial inflammation or infection. Electronically Signed   By: Jasmine Pang M.D.   On: 10/27/2016 21:01    EKG: Interpreted by me showed: Afib, 94 bpm, no acute st/t changes  Telemetry: Interpreted by me showed: Afib with RVR, 110s currently, has had tachycardic rates from the 120s to 150s bpm  Weights: Filed Weights   10/28/16 0117  Weight: 156 lb 14.4 oz (71.2 kg)     Physical Exam: Blood pressure 136/71, pulse (!) 113, temperature 97.2 F (36.2  C), temperature source Axillary, resp. rate 14, height  (1.651 m), weight 156 lb 14.4 oz (71.2 kg), SpO2 98 %. Body mass index is 26.11 kg/m. General: Frail appearing, in no acute distress. Head: Normocephalic, atraumatic, sclera non-icteric, no xanthomas, nares are without discharge.  Neck: Negative for carotid bruits. JVD not elevated. Lungs: Diminished breath sounds bilaterally with diffuse wheezing. Breathing is unlabored. On nasal cannula at 2 L.  Heart: Tachycardic, irregularly irregular with S1 S2. II/VI systolic murmur, no rubs, or gallops appreciated. Abdomen: Soft, non-tender, non-distended with normoactive bowel sounds. No hepatomegaly. No rebound/guarding. No obvious abdominal masses. Msk:  Strength and tone appear normal for age. Extremities: No clubbing or cyanosis. No edema. Distal pedal pulses are 2+ and equal bilaterally. Neuro: Alert and oriented X 3. No facial asymmetry. No focal deficit. Moves all extremities spontaneously. Psych:  Responds to questions appropriately with a normal affect.    Assessment and Plan:  Principal Problem:   Acute respiratory distress Active Problems:   Persistent atrial fibrillation (HCC)   HTN (hypertension)   Cognitive impairment   Atrial fibrillation with RVR (HCC)   Acute on chronic combined systolic and diastolic CHF (congestive heart  failure) (HCC)    1. Acute respiratory distress with hypoxia: -Likely multifactorial including bronchitis/aspiration pneumonitis, acute on chronic CHF, and Afib with RVR -Wean supplemental oxygen as able per IM  2. Acute on chronic CHF: -Patient's sister reports the patient's weight had increased to 170 pounds after her discharge in late March and was placed back on old dose of Lasix 40/20 with improvement in volume status -Gentle IV diuresis  -Strict IS & Os and daily weights  3. Chronic Afib with RVR: -Likely in the setting of the above -Would ideally like to taper beta blocker given wheezing, but she is intolerant to CCB -If needed for rate control could use digoxin -Eliquis -Stop ASA, no indication for dual therapy   4. Bronchitis/aspiration pneumonitis: -Change Duoneb to Xopenex and Atrovent given Afib with RVR -May need steroids -ABX per IM    Signed, Eula Listen, PA-C [facility] Pager: [phone] 10/28/2016, 3:25 PM

## 2016-10-28 NOTE — Progress Notes (Signed)
CCMD made aware pt moved from room 233 to 250, family member, Kathie Rhodes, at bed side and made aware,

## 2016-10-28 NOTE — Progress Notes (Signed)
ANTIBIOTIC CONSULT NOTE - INITIAL  Pharmacy Consult for Vancomycin Indication: bacteremia  Allergies  Allergen Reactions  . Aliskiren-Hydrochlorothiazide Other (See Comments)    Reaction: unknown  . Amlodipine Other (See Comments)    Reaction: unknown  . Sulfa Antibiotics Swelling    Patient Measurements: Height:  (165.1 cm) Weight: 156 lb 14.4 oz (71.2 kg) IBW/kg (Calculated) : 57 Adjusted Body Weight: 62.7 kg   Vital Signs: BP: 136/71 (04/30 1404) Pulse Rate: 113 (04/30 1404) Intake/Output from previous day: 04/29 0701 - 04/30 0700 In: 290 [P.O.:240; IV Piggyback:50] Out: -  Intake/Output from this shift: No intake/output data recorded.  Labs:  Recent Labs  10/27/16 1510 10/27/16 1513 10/28/16 0725  WBC  --  9.3 9.0  HGB  --  12.1 11.5*  PLT  --  290 247  CREATININE 0.86  --  1.00   Estimated Creatinine Clearance: 40 mL/min (by C-G formula based on SCr of 1 mg/dL). No results for input(s): VANCOTROUGH, VANCOPEAK, VANCORANDOM, GENTTROUGH, GENTPEAK, GENTRANDOM, TOBRATROUGH, TOBRAPEAK, TOBRARND, AMIKACINPEAK, AMIKACINTROU, AMIKACIN in the last 72 hours.   Microbiology: Recent Results (from the past 720 hour(s))  Culture, blood (routine x 2)     Status: None (Preliminary result)   Collection Time: 10/27/16  3:10 PM  Result Value Ref Range Status   Specimen Description BLOOD LEFT ASSIST CONTROL  Final   Special Requests BOTTLES DRAWN AEROBIC AND ANAEROBIC  BCAV  Final   Culture  Setup Time   Final    Organism ID to follow GRAM POSITIVE COCCI IN BOTH AEROBIC AND ANAEROBIC BOTTLES CRITICAL RESULT CALLED TO, READ BACK BY AND VERIFIED WITH: Tyniya Kuyper AT 1733 ON 10/28/2016 JJB    Culture GRAM POSITIVE COCCI  Final   Report Status PENDING  Incomplete  Blood Culture ID Panel (Reflexed)     Status: Abnormal   Collection Time: 10/27/16  3:10 PM  Result Value Ref Range Status   Enterococcus species NOT DETECTED NOT DETECTED Final   Listeria monocytogenes  NOT DETECTED NOT DETECTED Final   Staphylococcus species DETECTED (A) NOT DETECTED Final    Comment: CRITICAL RESULT CALLED TO, READ BACK BY AND VERIFIED WITH: Monai Hindes AT 1733 ON 10/28/2016 JJB    Staphylococcus aureus DETECTED (A) NOT DETECTED Final    Comment: Methicillin (oxacillin)-resistant Staphylococcus aureus (MRSA). MRSA is predictably resistant to beta-lactam antibiotics (except ceftaroline). Preferred therapy is vancomycin unless clinically contraindicated. Patient requires contact precautions if  hospitalized. CRITICAL RESULT CALLED TO, READ BACK BY AND VERIFIED WITH: Edwardine Deschepper AT 1733 ON 10/28/16 JJB    Methicillin resistance DETECTED (A) NOT DETECTED Final    Comment: CRITICAL RESULT CALLED TO, READ BACK BY AND VERIFIED WITH: Monaye Blackie AT 1733 ON 10/28/2016 JJB    Streptococcus species NOT DETECTED NOT DETECTED Final   Streptococcus agalactiae NOT DETECTED NOT DETECTED Final   Streptococcus pneumoniae NOT DETECTED NOT DETECTED Final   Streptococcus pyogenes NOT DETECTED NOT DETECTED Final   Acinetobacter baumannii NOT DETECTED NOT DETECTED Final   Enterobacteriaceae species NOT DETECTED NOT DETECTED Final   Enterobacter cloacae complex NOT DETECTED NOT DETECTED Final   Escherichia coli NOT DETECTED NOT DETECTED Final   Klebsiella oxytoca NOT DETECTED NOT DETECTED Final   Klebsiella pneumoniae NOT DETECTED NOT DETECTED Final   Proteus species NOT DETECTED NOT DETECTED Final   Serratia marcescens NOT DETECTED NOT DETECTED Final   Haemophilus influenzae NOT DETECTED NOT DETECTED Final   Neisseria meningitidis NOT DETECTED NOT DETECTED Final   Pseudomonas  aeruginosa NOT DETECTED NOT DETECTED Final   Candida albicans NOT DETECTED NOT DETECTED Final   Candida glabrata NOT DETECTED NOT DETECTED Final   Candida krusei NOT DETECTED NOT DETECTED Final   Candida parapsilosis NOT DETECTED NOT DETECTED Final   Candida tropicalis NOT DETECTED NOT DETECTED Final  Culture,  blood (routine x 2)     Status: None (Preliminary result)   Collection Time: 10/27/16 10:30 PM  Result Value Ref Range Status   Specimen Description BLOOD LEFT ARM  Final   Special Requests BOTTLES DRAWN AEROBIC AND ANAEROBIC  BCAV  Final   Culture NO GROWTH < 12 HOURS  Final   Report Status PENDING  Incomplete  MRSA PCR Screening     Status: Abnormal   Collection Time: 10/28/16  1:50 AM  Result Value Ref Range Status   MRSA by PCR POSITIVE (A) NEGATIVE Final    Comment:        The GeneXpert MRSA Assay (FDA approved for NASAL specimens only), is one component of a comprehensive MRSA colonization surveillance program. It is not intended to diagnose MRSA infection nor to guide or monitor treatment for MRSA infections. RESULT CALLED TO, READ BACK BY AND VERIFIED WITH: LISA ROMERO AT 0402 10/28/16.PMH     Medical History: Past Medical History:  Diagnosis Date  . Anxiety disorder   . Arthritis   . Chronic systolic CHF (congestive heart failure) (HCC)    a. echo 07/2014: EF 45-50%, dilated LA at 4.5 cm, mildly dilated RA, mod-sev MR, mod TR, mildly elevated PASP @ 45.2  . Cognitive impairment   . Depression   . Gait instability   . HTN (hypertension)   . Hyperlipidemia   . Panic attacks   . Parkinson's disease (HCC)   . Persistent atrial fibrillation (HCC)    a. Eliquis 5 mg bid; b. CHADS2VASc = 5 (CHF, HTN, age x 2, female)    Medications:  Scheduled:  . apixaban  5 mg Oral BID  . ferrous sulfate  325 mg Oral Daily  . furosemide  20 mg Intravenous QAC lunch  . furosemide  40 mg Intravenous Daily  . ipratropium  0.5 mg Nebulization Q6H  . levalbuterol  0.63 mg Nebulization Q6H  . [START ON 10/29/2016] levothyroxine  50 mcg Oral Daily  . metoprolol  50 mg Oral BID  . multivitamin with minerals  1 tablet Oral Daily  . PARoxetine  10 mg Oral Q M,W,F  . [START ON 10/29/2016] PARoxetine  20 mg Oral Q T,Th,S,Su  . potassium chloride SA  40 mEq Oral BID  . sodium chloride flush   3 mL Intravenous Q12H   Assessment: Pharmacy consulted to dose vancomycin in this 83 year old female with Staph Aureus (Mech A detected) growing in 1 of 4 anaerobic bottles. CrCl = 40 ml/min Ke = 0.04 hr-1 T1/2 = 18.2 hrs Vd = 43.9 L   Goal of Therapy:  Vancomycin trough level 15-20 mcg/ml  Plan:  Expected duration 7 days with resolution of temperature and/or normalization of WBC   Vancomcyin 1 gm IV X 1 ordered to be given on 4/30 @ 20:00. Vancomycin 1 gm IV Q24H ordered to start on 5/1 @ 6:00, ~ 10 hrs after 1st dose (stacked dosing). This pt will reach Css by 5/4 @ 12:00. Will draw 1st trough on 5/4 @ 0530, which will be very close to Css.   [name] 10/28/2016,7:26 PM

## 2016-10-28 NOTE — Evaluation (Signed)
Physical Therapy Evaluation Patient Details Name: [name] MRN: [id number] DOB: 05-19-1929 Today's Date: 10/28/2016   History of Present Illness  Pt is a 83 y/o F who presented with increased SOB and congestion.  CT angiogram of the chest showed vascular congestion and edema in the lungs.No evidence of any pulmonary embolism.  Pt admitted for acute on chronic systolic heart failure.  Pt's PMH includes chronic systolic heart failure, Parkinson's Disease, a-fib, cognitive impairment.     Clinical Impression  Pt admitted with above diagnosis. Pt currently with functional limitations due to the deficits listed below (see PT Problem List). Information provided by pt's sister as pt is a poor historian.  PTA [name] required assist for bathing, dressing, feeding and is nonambulatory.  She currently requires max assist for bed mobility but is able to maintain her balance with close min guard assist sitting EOB for ~4 minutes before fatiguing.  Will continue to follow pt acutely for balance, strength, and increasing independence with bed mobility.       Follow Up Recommendations SNF    Equipment Recommendations  None recommended by PT    Recommendations for Other Services OT consult     Precautions / Restrictions Precautions Precautions: Fall Restrictions Weight Bearing Restrictions: No      Mobility  Bed Mobility Overal bed mobility: Needs Assistance Bed Mobility: Supine to Sit;Sit to Supine     Supine to sit: Max assist;HOB elevated Sit to supine: Max assist   General bed mobility comments: Max assist for supine<>sit for management of BLEs and trunk.    Transfers                 General transfer comment: Pt non ambulatory  Ambulation/Gait                Stairs            Wheelchair Mobility    Modified Rankin (Stroke Patients Only)       Balance Overall balance assessment: Needs assistance Sitting-balance support: Single extremity supported;Feet  supported Sitting balance-Leahy Scale: Poor Sitting balance - Comments: Pt relies on 1UE to maintain balance seated EOB as well as close min guard assist.  Pt sits propped upon RUE only and leaning to the R and unable to correct posture without LOB.  She tolerated sitting EOB ~4 minutes before fatiguing and requiring assist to return to supine. Postural control: Right lateral lean                                   Pertinent Vitals/Pain Pain Assessment: No/denies pain    Home Living Family/patient expects to be discharged to:: Skilled nursing facility                 Additional Comments: Per sister, [facility] is from [name] has been since August 2017    Prior Function Level of Independence: Needs assistance   Gait / Transfers Assistance Needed: Pt non ambulatory and staff at [name] use lift to assist pt to and from bed to [name].    ADL's / Homemaking Assistance Needed: Pt requires assist with bathing, dressing, feeding.        Hand Dominance        Extremity/Trunk Assessment   Upper Extremity Assessment Upper Extremity Assessment: RUE deficits/detail;LUE deficits/detail RUE Deficits / Details: RUE AROM limited to ~60 deg shoulder F and pt presents with h/o RUE  tremor.  Strength grossly 3/5 throughout. LUE Deficits / Details: Pt's RUE AROM limited to ~100 deg shoulder flexion.  Strength grossly 3-/5 throughout.    Lower Extremity Assessment Lower Extremity Assessment: RLE deficits/detail;LLE deficits/detail RLE Deficits / Details: Lacking ~10 deg DF and strength grossly 2/5 LLE Deficits / Details: Lacking ~10 deg DF and strength grossly 2/5    Cervical / Trunk Assessment Cervical / Trunk Assessment: Kyphotic  Communication   Communication: No difficulties  Cognition Arousal/Alertness: Awake/alert Behavior During Therapy: WFL for tasks assessed/performed Overall Cognitive Status: History of cognitive impairments - at baseline                                         General Comments General comments (skin integrity, edema, etc.): SpO2 remained at or above 93% on 2L O2 throughout session.  Pt's sister present at end of session to provide information on PLOF.    Exercises General Exercises - Upper Extremity Shoulder Flexion: AAROM;Both;10 reps;Supine General Exercises - Lower Extremity Ankle Circles/Pumps: PROM;Both;5 reps;Supine Long Arc Quad: AROM;Both;5 reps;Seated Hip ABduction/ADduction: AAROM;Both;5 reps;Supine Straight Leg Raises: AAROM;Both;5 reps;Supine   Assessment/Plan    PT Assessment Patient needs continued PT services  PT Problem List Decreased strength;Decreased range of motion;Decreased activity tolerance;Decreased balance;Decreased mobility;Decreased cognition;Decreased knowledge of use of DME;Decreased safety awareness;Cardiopulmonary status limiting activity       PT Treatment Interventions DME instruction;Functional mobility training;Therapeutic activities;Therapeutic exercise;Balance training;Neuromuscular re-education;Cognitive remediation;Patient/family education;Modalities;Manual techniques    PT Goals (Current goals can be found in the Care Plan section)  Acute Rehab PT Goals Patient Stated Goal: Per daughter, to return to [name] PT Goal Formulation: With patient/family Time For Goal Achievement: 11/11/16 Potential to Achieve Goals: Fair    Frequency Min 2X/week   Barriers to discharge        Co-evaluation               AM-PAC PT "6 Clicks" Daily Activity  Outcome Measure Difficulty turning over in bed (including adjusting bedclothes, sheets and blankets)?: Total Difficulty moving from lying on back to sitting on the side of the bed? : Total Difficulty sitting down on and standing up from a chair with arms (e.g., wheelchair, bedside commode, etc,.)?: Total Help needed moving to and from a bed to chair (including a wheelchair)?: Total Help needed walking in  hospital room?: Total Help needed climbing 3-5 steps with a railing? : Total 6 Click Score: 6    End of Session Equipment Utilized During Treatment: Oxygen Activity Tolerance: Patient limited by fatigue Patient left: in bed;with call bell/phone within reach;with bed alarm set;Other (comment);with family/visitor present (with L prevalon boot in place) Nurse Communication: Mobility status;Need for lift equipment PT Visit Diagnosis: Muscle weakness (generalized) (M62.81)    Time: 9604-5409 PT Time Calculation (min) (ACUTE ONLY): 15 min   Charges:   PT Evaluation $PT Eval Low Complexity: 1 Procedure     PT G Codes:        Encarnacion Chu PT, DPT 10/28/2016, 3:16 PM

## 2016-10-29 DIAGNOSIS — R0603 Acute respiratory distress: Secondary | ICD-10-CM

## 2016-10-29 MED ORDER — FUROSEMIDE 20 MG PO TABS
20.0000 mg | ORAL_TABLET | Freq: Two times a day (BID) | ORAL | Status: DC
  Administered 2016-10-29 – 2016-10-31 (×4): 20 mg via ORAL
  Filled 2016-10-29 (×4): qty 1

## 2016-10-29 MED ORDER — MUPIROCIN 2 % EX OINT
1.0000 "application " | TOPICAL_OINTMENT | Freq: Two times a day (BID) | CUTANEOUS | Status: DC
  Administered 2016-10-29 – 2016-11-01 (×7): 1 via NASAL
  Filled 2016-10-29: qty 22

## 2016-10-29 MED ORDER — METOPROLOL TARTRATE 50 MG PO TABS
75.0000 mg | ORAL_TABLET | Freq: Two times a day (BID) | ORAL | Status: DC
  Administered 2016-10-29 – 2016-10-30 (×4): 75 mg via ORAL
  Filled 2016-10-29 (×6): qty 1

## 2016-10-29 MED ORDER — CHLORHEXIDINE GLUCONATE CLOTH 2 % EX PADS
6.0000 | MEDICATED_PAD | Freq: Every day | CUTANEOUS | Status: DC
  Administered 2016-10-29 – 2016-11-01 (×2): 6 via TOPICAL

## 2016-10-29 NOTE — Care Management (Signed)
Positive blood cultures. It is not known yet if patient will require long term IV antibiotics.  Patient presented here from [facility] and anticipate return when medically stable

## 2016-10-29 NOTE — Progress Notes (Signed)
[facility] [place] [facility]   PATIENT NAME: [name]    MR#:  [id number]  DATE OF BIRTH:  Jan 22, 1929  SUBJECTIVE:  Came in with increasing SOB  sister in the room No fever Pt more alert and eating food.   REVIEW OF SYSTEMS:   Review of Systems  Unable to perform ROS: Dementia   Tolerating Diet:yes Tolerating PT: pt is bed bound  DRUG ALLERGIES:   Allergies  Allergen Reactions  . Aliskiren-Hydrochlorothiazide Other (See Comments)    Reaction: unknown  . Amlodipine Other (See Comments)    Reaction: unknown  . Sulfa Antibiotics Swelling    VITALS:  Blood pressure 111/64, pulse 87, temperature 97.7 F (36.5 C), temperature source Oral, resp. rate 18, height  (1.651 m), weight 71.2 kg (156 lb 14.4 oz), SpO2 96 %.  PHYSICAL EXAMINATION:   Physical Exam  GENERAL:  83 y.o.-year-old patient lying in the bed with no acute distress. overweight EYES: Pupils equal, round, reactive to light and accommodation. No scleral icterus. Extraocular muscles intact.  HEENT: Head atraumatic, normocephalic. Oropharynx and nasopharynx clear.  NECK:  Supple, no jugular venous distention. No thyroid enlargement, no tenderness.  LUNGS: Normal breath sounds bilaterally, no wheezing, rales, rhonchi. No use of accessory muscles of respiration.  CARDIOVASCULAR: S1, S2 normal. No murmurs, rubs, or gallops.  ABDOMEN: Soft, nontender, nondistended. Bowel sounds present. No organomegaly or mass.  EXTREMITIES: No cyanosis, clubbing or edema b/l.   Foot drop. Tiny dried scab on the left heel NEUROLOGIC: unable to assess due to cognitive decline but overall non focal PSYCHIATRIC:  patient is alert SKIN: No obvious rash, lesion, or ulcer.   LABORATORY PANEL:  CBC  Recent Labs Lab 10/28/16 0725  WBC 9.0  HGB 11.5*  HCT 34.4*  PLT 247    Chemistries   Recent Labs Lab 10/27/16 1510 10/28/16 0725  NA 142 141  K 3.6 4.1  CL 101 103  CO2 33* 29   GLUCOSE 110* 130*  BUN 25* 23*  CREATININE 0.86 1.00  CALCIUM 8.9 8.9  AST 42*  --   ALT 33  --   ALKPHOS 72  --   BILITOT 0.8  --    Cardiac Enzymes  Recent Labs Lab 10/28/16 1324  TROPONINI <0.03   RADIOLOGY:  Dg Chest 2 View  Result Date: 10/27/2016 CLINICAL DATA:  Respiratory distress.  Cough.  Wheezing.  Rales. EXAM: CHEST  2 VIEW COMPARISON:  09/22/2016 FINDINGS: Stable mild cardiomegaly. No evidence of pulmonary infiltrate or edema. No evidence of pneumothorax or pleural effusion. IMPRESSION: Stable mild cardiomegaly.  No active lung disease. Electronically Signed   By: Myles Rosenthal M.D.   On: 10/27/2016 15:28   Ct Angio Chest Pe W And/or Wo Contrast  Result Date: 10/27/2016 CLINICAL DATA:  Shortness of breath with wheezing EXAM: CT ANGIOGRAPHY CHEST WITH CONTRAST TECHNIQUE: Multidetector CT imaging of the chest was performed using the standard protocol during bolus administration of intravenous contrast. Multiplanar CT image reconstructions and MIPs were obtained to evaluate the vascular anatomy. CONTRAST:  75 mL Isovue 370 intravenous COMPARISON:  10/27/2016, CT 09/22/2016 FINDINGS: Cardiovascular: Satisfactory opacification of the pulmonary arteries to the segmental level. The examination is compromised by respiratory motion artifact. No definitive central acute pulmonary embolus is visualized. Non aneurysmal aorta. Atherosclerotic calcifications and mural thrombus. Mild cardiomegaly. No significant pericardial effusion. Mediastinum/Nodes: Esophagus within normal limits. No significantly enlarged mediastinal lymph nodes. Midline trachea. Lungs/Pleura: Scattered ground-glass densities could relate to  diffuse atelectasis or mild edema. No focal consolidation. No pleural effusion. Minimal bronchial thickening within the bilateral lung bases. Upper Abdomen: No acute abnormality. Musculoskeletal: Degenerative changes. No acute or suspicious bone lesion. Review of the MIP images confirms  the above findings. IMPRESSION: 1. Motion degradation limits the exam. No definitive pulmonary embolus is visualized. 2. Scattered bilateral hazy densities could reflect diffuse atelectasis or mild edema. There is mild cardiomegaly. 3. Mild bronchial wall thickening within the bilateral lung bases suggesting bronchial inflammation or infection. Electronically Signed   By: Jasmine Pang M.D.   On: 10/27/2016 21:01   ASSESSMENT AND PLAN:  [name]  is a 83 y.o. female with a known history of Chronic systolic heart failure, healthcare associated pneumonia in the past, hypertension, hyperlipidemia, Parkinson's disease, atrial fibrillation on liquids for antecolic patient was recently discharged on 09/26/2016 to [facility] after being treated at our hospital for healthcare associated pneumonia and systolic heart failure. Patient presented again today from the facility for increased shortness of breath and congestion. Per the family member she appeared more congested and wheezy  1. Acute on chronicSystolic heart failure -Patient came in with increasing shortness of breath and weight gain at [facility]. sHe was recently admitted about 4 weeks ago and when discharged she was discharged on 20 mg of Lasix given her low pressure during the past admission. -Lasix 40 mg daily and 20 mg in the evening.---change to 20 mg bid -Sats are 100% on 2 L -Weaned to room air  2. MRSA positive blood culture 1/2  -IV vancomycin -TTE pending results -ID consulted--d/w Dr fitzgerald -?TEE  3. Hypertension Continue home meds  4.Chronic atrial fibrillation Continue Toprol and patient's oral anticoagulation  5. Parkinson disease with cognitive decline  6. Discharge planning patient is from [facility]. Heart baseline according to the daughter is wheelchair and jeri chair. She does not walk.  CSW is negative before discharge planning. Above was discussed with patient's daughter she appreciated be answering  most of the questions. Case discussed with Care Management/Social Worker. Management plans discussed with the patient, family and they are in agreement.  CODE STATUS: DNR  DVT Prophylaxis: eliquis  TOTAL TIME TAKING CARE OF THIS PATIENT: *40* minutes.  >50% time spent on counselling and coordination of care  POSSIBLE D/C IN  1-2 DAYS, DEPENDING ON CLINICAL CONDITION.  Note: This dictation was prepared with Dragon dictation along with smaller phrase technology. Any transcriptional errors that result from this process are unintentional.  Halford Goetzke M.D on 10/29/2016 at 2:48 PM  Between 7am to 6pm - Pager - [phone]  After 6pm go to [email or web address] - Social research officer, government  [facility]  Office  [phone]  CC: Primary care physician; Tillman Abide, MD

## 2016-10-29 NOTE — Plan of Care (Signed)
Problem: Physical Regulation: Goal: Will remain free from infection Outcome: Not Progressing Patient has positive blood cultures. MD aware. Consult ordered to infectious disease.   Problem: Skin Integrity: Goal: Risk for impaired skin integrity will decrease Outcome: Not Progressing Patient placed on q2hr turn schedule. Skin care provided and sacral foam dressing placed and protocol started.

## 2016-10-29 NOTE — Consult Note (Signed)
[facility] Infectious Disease     Reason for Consult:MRSA bacteremia   Referring Physician: Nicholes Mango  Date of Admission:  10/27/2016   Principal Problem:   Acute respiratory distress Active Problems:   HTN (hypertension)   Cognitive impairment   Chronic atrial fibrillation (HCC)   Atrial fibrillation with RVR (HCC)   Acute on chronic combined systolic and diastolic CHF (congestive heart failure) (HCC)   HPI: [name] is a 83 y.o. female admitted for the second time this month with SOB and hypoxia. On admit 9.3 and has been afebrile, however bcx+ MRSA by [employer]. She has had CT chest with patchy opacities. Continues with cough and SOB but much more alert today per sister and nursing.  No recent boils or abscesses but does have chronic L heel scab over previous ulcer. Was recently admitted for HCAP and treated but no cultures done on sputum at that time.  Also on admit with a fib with RVR and has seen cards.   Past Medical History:  Diagnosis Date  . Anxiety disorder   . Arthritis   . Chronic systolic CHF (congestive heart failure) ([place])    a. echo 07/2014: EF 45-50%, dilated LA at 4.5 cm, mildly dilated RA, mod-sev MR, mod TR, mildly elevated PASP @ 45.2  . Cognitive impairment   . Depression   . Gait instability   . HTN (hypertension)   . Hyperlipidemia   . Panic attacks   . Parkinson's disease ([place])   . Persistent atrial fibrillation (HCC)    a. Eliquis 5 mg bid; b. CHADS2VASc = 5 (CHF, HTN, age x 2, female)   Past Surgical History:  Procedure Laterality Date  . EYE SURGERY     Social History  Substance Use Topics  . Smoking status: Never Smoker  . Smokeless tobacco: Never Used  . Alcohol use No   Family History  Problem Relation Age of Onset  . Heart disease Sister     Allergies:  Allergies  Allergen Reactions  . Aliskiren-Hydrochlorothiazide Other (See Comments)    Reaction: unknown  . Amlodipine Other (See Comments)    Reaction: unknown  .  Sulfa Antibiotics Swelling    Current antibiotics: Antibiotics Given (last 72 hours)    Date/Time Action Medication Dose Rate   10/27/16 2238 New Bag/Given   piperacillin-tazobactam (ZOSYN) IVPB 3.375 g 3.375 g 100 mL/hr   10/28/16 0238 New Bag/Given   clindamycin (CLEOCIN) IVPB 600 mg 600 mg 100 mL/hr   10/28/16 1235 New Bag/Given   clindamycin (CLEOCIN) IVPB 600 mg 600 mg 100 mL/hr   10/28/16 2136 New Bag/Given   vancomycin (VANCOCIN) IVPB 1000 mg/200 mL premix 1,000 mg 200 mL/hr   10/29/16 0511 New Bag/Given   vancomycin (VANCOCIN) IVPB 1000 mg/200 mL premix 1,000 mg 200 mL/hr      MEDICATIONS: . apixaban  5 mg Oral BID  . Chlorhexidine Gluconate Cloth  6 each Topical Q0600  . ferrous sulfate  325 mg Oral Daily  . furosemide  20 mg Oral BID  . ipratropium  0.5 mg Nebulization Q6H  . levalbuterol  0.63 mg Nebulization Q6H  . levothyroxine  50 mcg Oral Daily  . metoprolol  75 mg Oral BID  . multivitamin with minerals  1 tablet Oral Daily  . mupirocin ointment  1 application Nasal BID  . PARoxetine  10 mg Oral Q M,W,F  . PARoxetine  20 mg Oral Q T,Th,S,Su  . potassium chloride SA  40 mEq Oral BID  .  sodium chloride flush  3 mL Intravenous Q12H    Review of Systems - 11 systems reviewed and negative per HPI   OBJECTIVE: Temp:  [97.7 F (36.5 C)-98.9 F (37.2 C)] 97.7 F (36.5 C) (05/01 0808) Pulse Rate:  [71-88] 87 (05/01 1405) Resp:  [18] 18 (05/01 1405) BP: (103-136)/(64-76) 111/64 (05/01 1405) SpO2:  [92 %-98 %] 96 % (05/01 1412) Physical Exam  Constitutional: awake but only minimally interactive, leaning off towards R side HENT: /AT, PERRLA, no scleral icterus Mouth/Throat: Oropharynx is clear and dry. No oropharyngeal exudate.  Cardiovascular: IRR< no MMR Pulmonary/Chest: poor air movement, mild rhonchi bik Neck = supple, no nuchal rigidity Abdominal: Soft. Bowel sounds are normal.  exhibits no distension. There is no tenderness.  Lymphadenopathy: no  cervical adenopathy. No axillary adenopathy Neurological: awake but frail and min interactive Skin: L heel with scab over previous wound Psychiatric: a normal mood and affect.  behavior is normal.    LABS: Results for orders placed or performed during the hospital encounter of 10/27/16 (from the past 48 hour(s))  Troponin I     Status: Abnormal   Collection Time: 10/27/16  5:31 PM  Result Value Ref Range   Troponin I 0.03 (HH) <0.03 ng/mL    Comment: CRITICAL VALUE NOTED. VALUE IS CONSISTENT WITH PREVIOUSLY REPORTED/CALLED VALUE.MSS  Culture, blood (routine x 2)     Status: None (Preliminary result)   Collection Time: 10/27/16 10:30 PM  Result Value Ref Range   Specimen Description BLOOD LEFT ARM    Special Requests BOTTLES DRAWN AEROBIC AND ANAEROBIC  BCAV    Culture NO GROWTH 2 DAYS    Report Status PENDING   Troponin I     Status: None   Collection Time: 10/28/16  1:28 AM  Result Value Ref Range   Troponin I <0.03 <0.03 ng/mL  MRSA PCR Screening     Status: Abnormal   Collection Time: 10/28/16  1:50 AM  Result Value Ref Range   MRSA by PCR POSITIVE (A) NEGATIVE    Comment:        The GeneXpert MRSA Assay (FDA approved for NASAL specimens only), is one component of a comprehensive MRSA colonization surveillance program. It is not intended to diagnose MRSA infection nor to guide or monitor treatment for MRSA infections. RESULT CALLED TO, READ BACK BY AND VERIFIED WITH: LISA ROMERO AT 0402 10/28/16.PMH   Troponin I     Status: None   Collection Time: 10/28/16  7:25 AM  Result Value Ref Range   Troponin I <0.03 <0.03 ng/mL  Basic metabolic panel     Status: Abnormal   Collection Time: 10/28/16  7:25 AM  Result Value Ref Range   Sodium 141 135 - 145 mmol/L   Potassium 4.1 3.5 - 5.1 mmol/L   Chloride 103 101 - 111 mmol/L   CO2 29 22 - 32 mmol/L   Glucose, Bld 130 (H) 65 - 99 mg/dL   BUN 23 (H) 6 - 20 mg/dL   Creatinine, Ser 1.00 0.44 - 1.00 mg/dL   Calcium 8.9 8.9 -  10.3 mg/dL   GFR calc non Af Amer 50 (L) >60 mL/min   GFR calc Af Amer 57 (L) >60 mL/min    Comment: (NOTE) The eGFR has been calculated using the CKD EPI equation. This calculation has not been validated in all clinical situations. eGFR's persistently <60 mL/min signify possible Chronic Kidney Disease.    Anion gap 9 5 - 15  CBC  Status: Abnormal   Collection Time: 10/28/16  7:25 AM  Result Value Ref Range   WBC 9.0 3.6 - 11.0 K/uL   RBC 3.39 (L) 3.80 - 5.20 MIL/uL   Hemoglobin 11.5 (L) 12.0 - 16.0 g/dL   HCT 34.4 (L) 35.0 - 47.0 %   MCV 101.4 (H) 80.0 - 100.0 fL   MCH 33.9 26.0 - 34.0 pg   MCHC 33.5 32.0 - 36.0 g/dL   RDW 13.3 11.5 - 14.5 %   Platelets 247 150 - 440 K/uL  Troponin I     Status: None   Collection Time: 10/28/16  1:24 PM  Result Value Ref Range   Troponin I <0.03 <0.03 ng/mL   No components found for: ESR, C REACTIVE PROTEIN MICRO: Recent Results (from the past 720 hour(s))  Culture, blood (routine x 2)     Status: Abnormal (Preliminary result)   Collection Time: 10/27/16  3:10 PM  Result Value Ref Range Status   Specimen Description BLOOD LEFT ASSIST CONTROL  Final   Special Requests BOTTLES DRAWN AEROBIC AND ANAEROBIC  BCAV  Final   Culture  Setup Time   Final    GRAM POSITIVE COCCI IN BOTH AEROBIC AND ANAEROBIC BOTTLES CRITICAL RESULT CALLED TO, READ BACK BY AND VERIFIED WITH: JASON ROBBINS AT 3557 ON 10/28/2016 JJB    Culture (A)  Final    STAPHYLOCOCCUS AUREUS CULTURE REINCUBATED FOR BETTER GROWTH Performed at [facility], [place] [address], [place], [place] [address]    Report Status PENDING  Incomplete  Blood Culture ID Panel (Reflexed)     Status: Abnormal   Collection Time: 10/27/16  3:10 PM  Result Value Ref Range Status   Enterococcus species NOT DETECTED NOT DETECTED Final   Listeria monocytogenes NOT DETECTED NOT DETECTED Final   Staphylococcus species DETECTED (A) NOT DETECTED Final    Comment: CRITICAL RESULT CALLED TO, READ  BACK BY AND VERIFIED WITH: JASON ROBBINS AT 1733 ON 10/28/2016 JJB    Staphylococcus aureus DETECTED (A) NOT DETECTED Final    Comment: Methicillin (oxacillin)-resistant Staphylococcus aureus (MRSA). MRSA is predictably resistant to beta-lactam antibiotics (except ceftaroline). Preferred therapy is vancomycin unless clinically contraindicated. Patient requires contact precautions if  hospitalized. CRITICAL RESULT CALLED TO, READ BACK BY AND VERIFIED WITH: JASON ROBBINS AT 1733 ON 10/28/16 JJB    Methicillin resistance DETECTED (A) NOT DETECTED Final    Comment: CRITICAL RESULT CALLED TO, READ BACK BY AND VERIFIED WITH: JASON ROBBINS AT 5427 ON 10/28/2016 JJB    Streptococcus species NOT DETECTED NOT DETECTED Final   Streptococcus agalactiae NOT DETECTED NOT DETECTED Final   Streptococcus pneumoniae NOT DETECTED NOT DETECTED Final   Streptococcus pyogenes NOT DETECTED NOT DETECTED Final   Acinetobacter baumannii NOT DETECTED NOT DETECTED Final   Enterobacteriaceae species NOT DETECTED NOT DETECTED Final   Enterobacter cloacae complex NOT DETECTED NOT DETECTED Final   Escherichia coli NOT DETECTED NOT DETECTED Final   Klebsiella oxytoca NOT DETECTED NOT DETECTED Final   Klebsiella pneumoniae NOT DETECTED NOT DETECTED Final   Proteus species NOT DETECTED NOT DETECTED Final   Serratia marcescens NOT DETECTED NOT DETECTED Final   Haemophilus influenzae NOT DETECTED NOT DETECTED Final   Neisseria meningitidis NOT DETECTED NOT DETECTED Final   Pseudomonas aeruginosa NOT DETECTED NOT DETECTED Final   Candida albicans NOT DETECTED NOT DETECTED Final   Candida glabrata NOT DETECTED NOT DETECTED Final   Candida krusei NOT DETECTED NOT DETECTED Final   Candida parapsilosis NOT DETECTED  NOT DETECTED Final   Candida tropicalis NOT DETECTED NOT DETECTED Final  Culture, blood (routine x 2)     Status: None (Preliminary result)   Collection Time: 10/27/16 10:30 PM  Result Value Ref Range Status    Specimen Description BLOOD LEFT ARM  Final   Special Requests BOTTLES DRAWN AEROBIC AND ANAEROBIC  BCAV  Final   Culture NO GROWTH 2 DAYS  Final   Report Status PENDING  Incomplete  MRSA PCR Screening     Status: Abnormal   Collection Time: 10/28/16  1:50 AM  Result Value Ref Range Status   MRSA by PCR POSITIVE (A) NEGATIVE Final    Comment:        The GeneXpert MRSA Assay (FDA approved for NASAL specimens only), is one component of a comprehensive MRSA colonization surveillance program. It is not intended to diagnose MRSA infection nor to guide or monitor treatment for MRSA infections. RESULT CALLED TO, READ BACK BY AND VERIFIED WITH: LISA ROMERO AT 0402 10/28/16.PMH     IMAGING: Dg Chest 2 View  Result Date: 10/27/2016 CLINICAL DATA:  Respiratory distress.  Cough.  Wheezing.  Rales. EXAM: CHEST  2 VIEW COMPARISON:  09/22/2016 FINDINGS: Stable mild cardiomegaly. No evidence of pulmonary infiltrate or edema. No evidence of pneumothorax or pleural effusion. IMPRESSION: Stable mild cardiomegaly.  No active lung disease. Electronically Signed   By: Earle Gell M.D.   On: 10/27/2016 15:28   Ct Angio Chest Pe W And/or Wo Contrast  Result Date: 10/27/2016 CLINICAL DATA:  Shortness of breath with wheezing EXAM: CT ANGIOGRAPHY CHEST WITH CONTRAST TECHNIQUE: Multidetector CT imaging of the chest was performed using the standard protocol during bolus administration of intravenous contrast. Multiplanar CT image reconstructions and MIPs were obtained to evaluate the vascular anatomy. CONTRAST:  75 mL Isovue 370 intravenous COMPARISON:  10/27/2016, CT 09/22/2016 FINDINGS: Cardiovascular: Satisfactory opacification of the pulmonary arteries to the segmental level. The examination is compromised by respiratory motion artifact. No definitive central acute pulmonary embolus is visualized. Non aneurysmal aorta. Atherosclerotic calcifications and mural thrombus. Mild cardiomegaly. No significant  pericardial effusion. Mediastinum/Nodes: Esophagus within normal limits. No significantly enlarged mediastinal lymph nodes. Midline trachea. Lungs/Pleura: Scattered ground-glass densities could relate to diffuse atelectasis or mild edema. No focal consolidation. No pleural effusion. Minimal bronchial thickening within the bilateral lung bases. Upper Abdomen: No acute abnormality. Musculoskeletal: Degenerative changes. No acute or suspicious bone lesion. Review of the MIP images confirms the above findings. IMPRESSION: 1. Motion degradation limits the exam. No definitive pulmonary embolus is visualized. 2. Scattered bilateral hazy densities could reflect diffuse atelectasis or mild edema. There is mild cardiomegaly. 3. Mild bronchial wall thickening within the bilateral lung bases suggesting bronchial inflammation or infection. Electronically Signed   By: Donavan Foil M.D.   On: 10/27/2016 21:01    Assessment:   [name] is a 83 y.o. female with MRSA bacteremia in setting of recent hospitalization for HCAP. Possible sources of bacteremia include her L heel wound although appears clean, PNA or possible IV access points from prior recent admission. She has no fevers and nml wbc. Will need eval for endocardidits, repeat bcx and min 2 weeks IV abx  Recommendations Check echo Repeat bcx ordered If bcx negative 5/ 3 can place picc Will consider TEE but she is frail and has poor respiratory status so risk of decompensation if done.   Thank you very much for allowing me to participate in the care of this patient. Please call with questions.  Cheral Marker. Ola Spurr, MD

## 2016-10-29 NOTE — Progress Notes (Signed)
PT Cancellation Note  Patient Details Name: [name] MRN: [id number] DOB: 11-10-1928   Cancelled Treatment:    Reason Eval/Treat Not Completed: Fatigue/lethargy limiting ability to participate. Treatment attempted; pt soundly sleeping. Family member with pt wishes to re attempt tomorrow.    Scot Dock, PTA 10/29/2016, 3:47 PM

## 2016-10-29 NOTE — Care Management Important Message (Signed)
Important Message  Patient Details  Name: [name] MRN: [id number] Date of Birth: 1929-01-11   Medicare Important Message Given:  Yes Signed IM notice given to daughter    Eber Hong, RN 10/29/2016, 4:04 PM

## 2016-10-29 NOTE — Progress Notes (Signed)
Patient Name: [name] Date of Encounter: 10/29/2016  Primary Cardiologist: [facility] Problem List     Principal Problem:   Acute respiratory distress Active Problems:   HTN (hypertension)   Cognitive impairment   Chronic atrial fibrillation (HCC)   Atrial fibrillation with RVR (HCC)   Acute on chronic combined systolic and diastolic CHF (congestive heart failure) (HCC)     Subjective   No complaints this morning. Remains on supplemental oxygen at 2 L via nasal cannula. Heart rates improved to the low 100s to 120s bpm today. No chest pain. Sister present with her today.   Inpatient Medications    Scheduled Meds: . apixaban  5 mg Oral BID  . Chlorhexidine Gluconate Cloth  6 each Topical Q0600  . ferrous sulfate  325 mg Oral Daily  . furosemide  20 mg Intravenous QAC lunch  . furosemide  40 mg Intravenous Daily  . ipratropium  0.5 mg Nebulization Q6H  . levalbuterol  0.63 mg Nebulization Q6H  . levothyroxine  50 mcg Oral Daily  . metoprolol  75 mg Oral BID  . multivitamin with minerals  1 tablet Oral Daily  . mupirocin ointment  1 application Nasal BID  . PARoxetine  10 mg Oral Q M,W,F  . PARoxetine  20 mg Oral Q T,Th,S,Su  . potassium chloride SA  40 mEq Oral BID  . sodium chloride flush  3 mL Intravenous Q12H   Continuous Infusions: . sodium chloride    . vancomycin Stopped (10/29/16 0654)   PRN Meds: sodium chloride, acetaminophen **OR** acetaminophen, ondansetron **OR** ondansetron (ZOFRAN) IV, senna-docusate, sodium chloride flush   Vital Signs    Vitals:   10/28/16 2015 10/29/16 0204 10/29/16 0453 10/29/16 0808  BP:   124/76 136/76  Pulse:   71 88  Resp:   18 18  Temp:   98.9 F (37.2 C) 97.7 F (36.5 C)  TempSrc:   Oral Oral  SpO2: 97% 98% 92% 98%  Weight:      Height:        Intake/Output Summary (Last 24 hours) at 10/29/16 0900 Last data filed at 10/28/16 2136  Gross per 24 hour  Intake              200 ml  Output                 0 ml  Net              200 ml   Filed Weights   10/28/16 0117  Weight: 156 lb 14.4 oz (71.2 kg)    Physical Exam    GEN: Frail appearing, in no acute distress.  HEENT: Grossly normal.  Neck: Supple, no JVD, carotid bruits, or masses. Cardiac: Irregularly irregular, II/VI systolic murmur, no rubs, or gallops. No clubbing, cyanosis, edema.  Radials/DP/PT 2+ and equal bilaterally.  Respiratory:  Diminished breath sounds bilaterally with improving wheezing. On nasal cannula at 2 L.  GI: Soft, nontender, nondistended, BS + x 4. MS: no deformity or atrophy. Skin: warm and dry, no rash. Neuro:  Strength and sensation are intact. Psych: AAOx3.  Normal affect.  Labs    CBC  Recent Labs  10/27/16 1513 10/28/16 0725  WBC 9.3 9.0  NEUTROABS 5.9  --   HGB 12.1 11.5*  HCT 36.1 34.4*  MCV 101.9* 101.4*  PLT 290 247   Basic Metabolic Panel  Recent Labs  10/27/16 1510 10/28/16 0725  NA 142 141  K 3.6  4.1  CL 101 103  CO2 33* 29  GLUCOSE 110* 130*  BUN 25* 23*  CREATININE 0.86 1.00  CALCIUM 8.9 8.9   Liver Function Tests  Recent Labs  10/27/16 1510  AST 42*  ALT 33  ALKPHOS 72  BILITOT 0.8  PROT 7.1  ALBUMIN 3.3*   No results for input(s): LIPASE, AMYLASE in the last 72 hours. Cardiac Enzymes  Recent Labs  10/28/16 0128 10/28/16 0725 10/28/16 1324  TROPONINI <0.03 <0.03 <0.03   BNP Invalid input(s): POCBNP D-Dimer No results for input(s): DDIMER in the last 72 hours. Hemoglobin A1C No results for input(s): HGBA1C in the last 72 hours. Fasting Lipid Panel No results for input(s): CHOL, HDL, LDLCALC, TRIG, CHOLHDL, LDLDIRECT in the last 72 hours. Thyroid Function Tests No results for input(s): TSH, T4TOTAL, T3FREE, THYROIDAB in the last 72 hours.  Invalid input(s): FREET3  Telemetry    Afib, 100s to 120s bpm - Personally Reviewed  ECG    n/a - Personally Reviewed  Radiology    Dg Chest 2 View  Result Date: 10/27/2016 IMPRESSION: Stable  mild cardiomegaly.  No active lung disease. Electronically Signed   By: Myles Rosenthal M.D.   On: 10/27/2016 15:28   Ct Angio Chest Pe W And/or Wo Contrast  Result Date: 10/27/2016 IMPRESSION: 1. Motion degradation limits the exam. No definitive pulmonary embolus is visualized. 2. Scattered bilateral hazy densities could reflect diffuse atelectasis or mild edema. There is mild cardiomegaly. 3. Mild bronchial wall thickening within the bilateral lung bases suggesting bronchial inflammation or infection. Electronically Signed   By: Jasmine Pang M.D.   On: 10/27/2016 21:01    Cardiac Studies   TTE 07/2014: - EF 45-50%, dilated left atrium at 45 mm, dilated right atrium, moderate to severe mitral regurgitation, moderate tricuspid regurgitation, and mildly elevated right-sided pressure at 45.2 mmHg  Patient Profile     83 y.o. female with history of atrial fibrillation on Eliquis, chronic systolic CHF, cognitive impairment, hypertension, gait instability now wheelchair-bound, Parkinson's disease, and anxiety/panic attack who was recently admitted to [facility] with multi-focal PNA returned to [facility] on 4/30 with a one-day history of increased SOB.  Assessment & Plan    1. Acute respiratory distress with hypoxia: -Likely multifactorial including bronchitis/aspiration pneumonitis, possible mild acute on chronic CHF, and Afib with RVR -Wean supplemental oxygen as able per IM  2. Possible mild acute on chronic CHF: -Patient's sister reports the patient's weight had increased to 170 pounds after her discharge in late March and was placed back on old dose of Lasix 40/20 with improvement in volume status -She does not appear to be grossly volume overloaded at this time -Check TTE -Gentle IV diuresis  -Strict IS & Os and daily weights  3. Chronic Afib with RVR: -Likely in the setting of the above -Heart rate improving with improving pulmonary status -Titrate Lopressor to 75 mg bid -If needed for rate  control could use digoxin -Eliquis -No ASA, no indication for dual therapy   4. Bronchitis/aspiration pneumonitis: -Continue Xopenex and Atrovent given Afib with RVR in place of Duoneb -May need steroids, though this may exacerbate Afib -ABX per IM -Would add incentive spirometer    Signed, Eula Listen, PA-C [facility] Pager: [phone] 10/29/2016, 9:00 AM

## 2016-10-30 ENCOUNTER — Inpatient Hospital Stay (HOSPITAL_COMMUNITY)
Admit: 2016-10-30 | Discharge: 2016-10-30 | Disposition: A | Discharge status: Home / Self Care | Payer: Medicare Other | Attending: Physician Assistant | Admitting: Physician Assistant

## 2016-10-30 ENCOUNTER — Inpatient Hospital Stay: Payer: Medicare Other

## 2016-10-30 DIAGNOSIS — R0600 Dyspnea, unspecified: Secondary | ICD-10-CM

## 2016-10-30 DIAGNOSIS — R4189 Other symptoms and signs involving cognitive functions and awareness: Secondary | ICD-10-CM

## 2016-10-30 DIAGNOSIS — I482 Chronic atrial fibrillation: Secondary | ICD-10-CM

## 2016-10-30 DIAGNOSIS — J690 Pneumonitis due to inhalation of food and vomit: Secondary | ICD-10-CM

## 2016-10-30 LAB — GLUCOSE, CAPILLARY: GLUCOSE-CAPILLARY: 87 mg/dL (ref 65–99)

## 2016-10-30 LAB — ECHOCARDIOGRAM COMPLETE
Height: 65 in
WEIGHTICAEL: 2510.4 [oz_av]

## 2016-10-30 MED ORDER — GUAIFENESIN-DM 100-10 MG/5ML PO SYRP
5.0000 mL | ORAL_SOLUTION | ORAL | Status: DC | PRN
  Administered 2016-10-30: 5 mL via ORAL
  Filled 2016-10-30: qty 5

## 2016-10-30 MED ORDER — SODIUM CHLORIDE 0.9 % IV SOLN
3.0000 g | Freq: Four times a day (QID) | INTRAVENOUS | Status: DC
  Administered 2016-10-30 – 2016-11-01 (×8): 3 g via INTRAVENOUS
  Filled 2016-10-30 (×12): qty 3

## 2016-10-30 NOTE — Progress Notes (Signed)
Took report from [name].  Patient resting comfortably.  Family at bedside.

## 2016-10-30 NOTE — Progress Notes (Signed)
*  PRELIMINARY RESULTS* Echocardiogram 2D Echocardiogram has been performed.  Cristela Blue 10/30/2016, 12:39 PM

## 2016-10-30 NOTE — Clinical Social Work Note (Signed)
Clinical Social Work Assessment  Patient Details  Name: [name] MRN: [id number] Date of Birth: 09/03/1928  Date of referral:  10/30/16               Reason for consult:  Facility Placement                Permission sought to share information with:  Family Supports, Magazine features editor Permission granted to share information::  Yes, Verbal Permission Granted  Name::     Rexene Edison Sister [phone]  [phone] or Dichak,Anita Niece [phone]  [phone] or Dichak,Joe Relative [phone]  [phone]   Agency::  SNF admissions  Relationship::     Contact Information:     Housing/Transportation Living arrangements for the past 2 months:  Skilled Nursing Facility Source of Information:  Medical Team Patient Interpreter Needed:  None Criminal Activity/Legal Involvement Pertinent to Current Situation/Hospitalization:  No - Comment as needed Significant Relationships:  Other Family Members Lives with:  Facility Resident Do you feel safe going back to the place where you live?  Yes Need for family participation in patient care:  Yes (Comment)  Care giving concerns: Patient can return back to SNF once patient is medically ready for discharge.   Social Worker assessment / plan:  Patient is a 83 year old female who is a long term care resident at [facility].  Patient has been at [facility] for several months, patient's family did not express any concerns about returning back to [facility].  Patient and family did not have any other questions or concerns.  CSW was given permission to send clinical information to [facility] and facilitate discharge planning back to SNF.  Employment status:  Retired Health and safety inspector:  Armed forces operational officer, Medicaid In [place] PT Recommendations:  Skilled Nursing Facility Information / Referral to community resources:  Skilled Nursing Facility  Patient/Family's Response to care:  Patient and family agreeable to returning back to  SNF.  Patient/Family's Understanding of and Emotional Response to Diagnosis, Current Treatment, and Prognosis: Patient's family agreeable to returning back to SNF they understand patient's prognosis and current treatment plan.  Emotional Assessment Appearance:  Appears stated age Attitude/Demeanor/Rapport:    Affect (typically observed):  Appropriate, Calm Orientation:  Oriented to Self, Oriented to Place Alcohol / Substance use:  Not Applicable Psych involvement (Current and /or in the community):  No (Comment)  Discharge Needs  Concerns to be addressed:  No discharge needs identified Readmission within the last 30 days:  No Current discharge risk:  None Barriers to Discharge:  No Barriers Identified   Darleene Cleaver, LCSWA 10/30/2016, 2:56 PM

## 2016-10-30 NOTE — Progress Notes (Signed)
Physical Therapy Treatment Patient Details Name: [name] MRN: [id number] DOB: 24-Apr-1929 Today's Date: 10/30/2016    History of Present Illness Pt is a 83 y/o F who presented with increased SOB and congestion.  CT angiogram of the chest showed vascular congestion and edema in the lungs.No evidence of any pulmonary embolism.  Pt admitted for acute on chronic systolic heart failure.  Pt's PMH includes chronic systolic heart failure, Parkinson's Disease, a-fib, cognitive impairment.     PT Comments    Pt is making limited progress towards goals secondary to limited mobility at baseline. Pt needs physical assist for bed mobility and hygiene secondary to incontinence episodes. Able to sit at EOB for a few minutes, however has poor sitting balance, needs assist to maintain upright. She then fatigues with all exertion. Will continue to progress.   Follow Up Recommendations  SNF     Equipment Recommendations  None recommended by PT    Recommendations for Other Services OT consult     Precautions / Restrictions Precautions Precautions: Fall Restrictions Weight Bearing Restrictions: No    Mobility  Bed Mobility Overal bed mobility: Needs Assistance Bed Mobility: Supine to Sit;Sit to Supine     Supine to sit: Max assist;HOB elevated Sit to supine: Max assist   General bed mobility comments: assist for B LEs and trunk. Able to sit for approx 2-3 minutes prior to fatigue. Unable to maintain upright posture without min/mod assist. Tends to lose balance towards R side. Assist to slide up in bed and positioned once returned supine with +2 assist.  Transfers                 General transfer comment: Pt non ambulatory  Ambulation/Gait                 Stairs            Wheelchair Mobility    Modified Rankin (Stroke Patients Only)       Balance                                            Cognition Arousal/Alertness: Awake/alert Behavior  During Therapy: WFL for tasks assessed/performed Overall Cognitive Status: History of cognitive impairments - at baseline                                        Exercises Other Exercises Other Exercises: Assisted with rolling to B side for hygiene after incontinence episode. Needs +2 assist for rolling and maintaining side position. Max assist required for hygiene. Pt able to follow commands.    General Comments        Pertinent Vitals/Pain Pain Assessment: No/denies pain    Home Living                      Prior Function            PT Goals (current goals can now be found in the care plan section) Acute Rehab PT Goals Patient Stated Goal: Per daughter, [facility] PT Goal Formulation: With patient/family Time For Goal Achievement: 11/11/16 Potential to Achieve Goals: Fair Progress towards PT goals: Progressing toward goals    Frequency    Min 2X/week      PT Plan Current  plan remains appropriate    Co-evaluation              AM-PAC PT "6 Clicks" Daily Activity  Outcome Measure  Difficulty turning over in bed (including adjusting bedclothes, sheets and blankets)?: Total Difficulty moving from lying on back to sitting on the side of the bed? : Total Difficulty sitting down on and standing up from a chair with arms (e.g., wheelchair, bedside commode, etc,.)?: Total Help needed moving to and from a bed to chair (including a wheelchair)?: Total Help needed walking in hospital room?: Total Help needed climbing 3-5 steps with a railing? : Total 6 Click Score: 6    End of Session Equipment Utilized During Treatment: Oxygen Activity Tolerance: Patient limited by fatigue Patient left: in bed;with call bell/phone within reach;with bed alarm set;Other (comment);with family/visitor present Nurse Communication: Mobility status;Need for lift equipment PT Visit Diagnosis: Muscle weakness (generalized) (M62.81)     Time:  1610-9604 PT Time Calculation (min) (ACUTE ONLY): 23 min  Charges:  $Therapeutic Activity: 23-37 mins                    G Codes:       Elizabeth Palau, PT, DPT [phone]    [facility] 10/30/2016, 2:54 PM

## 2016-10-30 NOTE — Progress Notes (Signed)
Patient Name: [name] Date of Encounter: 10/30/2016  Primary Cardiologist: [facility] Problem List     Principal Problem:   Acute respiratory distress Active Problems:   HTN (hypertension)   Cognitive impairment   Chronic atrial fibrillation (HCC)   Atrial fibrillation with RVR (HCC)   Acute on chronic combined systolic and diastolic CHF (congestive heart failure) (HCC)     Subjective   1/2 blood culture with MRSA. ID on board. No labs this morning. Lasix changed to 20 mg bid by IM. TTE pending. Heart rate overall better controlled.   Inpatient Medications    Scheduled Meds: . apixaban  5 mg Oral BID  . Chlorhexidine Gluconate Cloth  6 each Topical Q0600  . ferrous sulfate  325 mg Oral Daily  . furosemide  20 mg Oral BID  . ipratropium  0.5 mg Nebulization Q6H  . levalbuterol  0.63 mg Nebulization Q6H  . levothyroxine  50 mcg Oral Daily  . metoprolol  75 mg Oral BID  . multivitamin with minerals  1 tablet Oral Daily  . mupirocin ointment  1 application Nasal BID  . PARoxetine  10 mg Oral Q M,W,F  . PARoxetine  20 mg Oral Q T,Th,S,Su  . potassium chloride SA  40 mEq Oral BID  . sodium chloride flush  3 mL Intravenous Q12H   Continuous Infusions: . sodium chloride    . vancomycin 1,000 mg (10/30/16 0515)   PRN Meds: sodium chloride, acetaminophen **OR** acetaminophen, guaiFENesin-dextromethorphan, ondansetron **OR** ondansetron (ZOFRAN) IV, senna-docusate, sodium chloride flush   Vital Signs    Vitals:   10/29/16 1412 10/29/16 1951 10/30/16 0159 10/30/16 0440  BP:  124/88  106/65  Pulse:  99  (!) 107  Resp:  17  18  Temp:  98.3 F (36.8 C)  99.5 F (37.5 C)  TempSrc:  Oral  Oral  SpO2: 96% 92% 93% 93%  Weight:      Height:        Intake/Output Summary (Last 24 hours) at 10/30/16 0805 Last data filed at 10/30/16 0730  Gross per 24 hour  Intake              200 ml  Output                0 ml  Net              200 ml   Filed Weights   10/28/16 0117  Weight: 156 lb 14.4 oz (71.2 kg)    Physical Exam    GEN: Frail appearing, in no acute distress.  HEENT: Grossly normal.  Neck: Supple, no JVD, carotid bruits, or masses. Cardiac: Irregularly irregular, II/VI systolic murmur, no rubs, or gallops. No clubbing, cyanosis, edema.  Radials/DP/PT 2+ and equal bilaterally.  Respiratory:  Diminished breath sounds bilaterally with bibasilar crackles. On nasal cannula at 2 L. GI: Soft, nontender, nondistended, BS + x 4. MS: no deformity or atrophy. Skin: warm and dry, no rash. Neuro:  Strength and sensation are intact. Psych: AAOx3.  Normal affect.  Labs    CBC  Recent Labs  10/27/16 1513 10/28/16 0725  WBC 9.3 9.0  NEUTROABS 5.9  --   HGB 12.1 11.5*  HCT 36.1 34.4*  MCV 101.9* 101.4*  PLT 290 247   Basic Metabolic Panel  Recent Labs  10/27/16 1510 10/28/16 0725  NA 142 141  K 3.6 4.1  CL 101 103  CO2 33* 29  GLUCOSE 110* 130*  BUN 25* 23*  CREATININE 0.86 1.00  CALCIUM 8.9 8.9   Liver Function Tests  Recent Labs  10/27/16 1510  AST 42*  ALT 33  ALKPHOS 72  BILITOT 0.8  PROT 7.1  ALBUMIN 3.3*   No results for input(s): LIPASE, AMYLASE in the last 72 hours. Cardiac Enzymes  Recent Labs  10/28/16 0128 10/28/16 0725 10/28/16 1324  TROPONINI <0.03 <0.03 <0.03   BNP Invalid input(s): POCBNP D-Dimer No results for input(s): DDIMER in the last 72 hours. Hemoglobin A1C No results for input(s): HGBA1C in the last 72 hours. Fasting Lipid Panel No results for input(s): CHOL, HDL, LDLCALC, TRIG, CHOLHDL, LDLDIRECT in the last 72 hours. Thyroid Function Tests No results for input(s): TSH, T4TOTAL, T3FREE, THYROIDAB in the last 72 hours.  Invalid input(s): FREET3  Telemetry    Afib, 80s bpm - Personally Reviewed  ECG    n/a - Personally Reviewed  Radiology    No results found.  Cardiac Studies   TTE 07/2014: - EF 45-50%, dilated left atrium at 45 mm, dilated right atrium, moderate  to severe mitral regurgitation, moderate tricuspid regurgitation, and mildly elevated right-sided pressure at 45.2 mmHg  TTE this admission pending  Patient Profile     83 y.o. female with history of chronic atrial fibrillation on Eliquis, chronic systolic CHF, cognitive impairment, hypertension, gait instability now wheelchair-bound, Parkinson's disease, and anxiety/panic attack who was recently admitted to [facility] with multi-focal PNA returned to [facility] on 4/30 with a one-day history of increased SOB.  Assessment & Plan    1. Acute respiratory distress with hypoxia: -Likely multifactorial including bronchitis/aspiration pneumonitis, possible mild acute on chronic CHF, and Afib with RVR -Wean supplemental oxygen as able per IM  2. Acute on chronic CHF: -Patient's sister reports the patient's weight had increased to 170 pounds after her discharge in late March and was placed back on old dose of Lasix 40/20 with improvement in volume status -Now on PO Lasix 20 mg bid, check bmet -Lungs with crackles -Check CXR -May need to go back on IV Lasix  -She does not appear to be grossly volume overloaded at this time -Check TTE -Gentle diuresis  -Strict IS & Os and daily weights  3. Chronic Afib with RVR: -Likely in the setting of the above -Heart rate controlled currently -Continue Lopressor to 75 mg bid -If needed for rate control could use digoxin -Eliquis -No ASA, no indication for dual therapy   4. MRSA bacteremia/bronchitis/aspiration pneumonitis: -Seen ID -Possible sources include L heel wound, PNA, possible IV source with multiple admissions -TTE today, may need TEE if would tolerate  -Continue Xopenex and Atrovent given Afib with RVR in place of Duoneb -May need steroids, though this may exacerbate Afib -ABX per IM/ID -Incentive spirometer   Signed, Eula Listen, PA-C [facility] Pager: [phone] 10/30/2016, 8:05 AM   Attending Note Patient seen and examined, agree  with detailed note above,  Patient presentation and plan discussed on rounds.   Patient known to me from the clinic with gait instability, cognitive impairment, depression and panic attacks, chronic atrial fibrillation, previous ejection fraction 45-50% Wheelchair-bound Previous CT head showing chronic infarcts with encephalomalacia Managed on chronic anticoagulation Presenting April 30 with congestion, wheezing, cough.  Recent hospital admission for similar presentation, completed outpatient antibiotics, now with recurrent symptoms   This morning with significant cough, nonproductive Alert, able to answer questions but difficulty communicating Sister by her bedside, concerned about dose of Lasix  On exam no significant JVP,  heart sounds irregular with no murmurs appreciated, coarse lung sounds with rails him a abdomen soft nontender, and no significant lower extremity edema, contractures of the lower extremities  -Echocardiogram showing mildly depressed ejection fraction 45%, unchanged from previous study No significant valve disease, no significant vegetations noted concerning for endocarditis, mildly elevated right heart pressures  -----Acute respiratory distress Concerning for chronic aspiration Currently on vancomycin Clinically cough consistent with bronchitis/pneumonia Chest x-ray this morning with by basilar atelectasis/infiltrates, right basilar opacity concerning for pneumonia (poor inspiratory effort)  ---- chronic diastolic and systolic CHF Echocardiogram unchanged, mildly elevated right heart pressures Presentation is not secondary to CHF, predominately from underlying infection With that said would continue gentle Lasix 20 twice a day If oral fluid intake increases, could increase up to 40 twice a day  Long discussion with patient's sister at the bedside, all questions answered  Greater than 50% was spent in counseling and coordination of care with patient Total  encounter time 35 minutes or more   Signed: Dossie Arbour  M.D., Ph.D. [facility]

## 2016-10-30 NOTE — NC FL2 (Signed)
[place] MEDICAID FL2 LEVEL OF CARE SCREENING TOOL     IDENTIFICATION  Patient Name: [name] Birthdate: 07-Nov-1928 Sex: female Admission Date (Current Location): 10/27/2016  [place] and [place] Number:  [address] [id number] [facility] Facility and Address:  [facility], [address] [place], [place] [address]      Provider Number: [id number]  Attending Physician Name and Address:  Enedina Finner, MD  Relative Name and Phone Number:  Rexene Edison Sister [phone]  [phone] or Dichak,Anita Niece [phone]  [phone] or Dichak,Joe Relative [phone]  [phone]     Current Level of Care: Hospital Recommended Level of Care: Skilled Nursing Facility Prior Approval Number:    Date Approved/Denied:   PASRR Number: [id number]  Discharge Plan: SNF    Current Diagnoses: Patient Active Problem List   Diagnosis Date Noted  . Acute respiratory distress 10/28/2016  . Acute on chronic combined systolic and diastolic CHF (congestive heart failure) (HCC) 10/28/2016  . Pressure injury of skin 09/23/2016  . Acute respiratory failure with hypercapnia (HCC) 09/23/2016  . Multifocal pneumonia 09/22/2016  . Altered mental state 09/18/2015  . UTI (lower urinary tract infection) 09/18/2015  . Pressure ulcer 09/18/2015  . Acute suppurative parotitis   . Atrial fibrillation with RVR (HCC)   . Hypokalemia   . Sepsis (HCC)   . Morbid obesity due to excess calories (HCC)   . Submandibular gland infection 08/13/2015  . Bilateral leg edema 10/24/2014  . Chronic atrial fibrillation (HCC) 08/23/2014  . Chronic systolic CHF (congestive heart failure) (HCC)   . HTN (hypertension)   . Cognitive impairment   . Gait instability   . Depression   . Panic attacks     Orientation RESPIRATION BLADDER Height & Weight     Self, Place  O2 (2L) Incontinent Weight: 156 lb 14.4 oz (71.2 kg) Height:   (165.1 cm)  BEHAVIORAL SYMPTOMS/MOOD NEUROLOGICAL BOWEL  NUTRITION STATUS      Incontinent, Continent Diet (Dysphagia 1 diet)  AMBULATORY STATUS COMMUNICATION OF NEEDS Skin   Limited Assist Verbally PU Stage and Appropriate Care PU Stage 1 Dressing:  (PRN dressing change)                     Personal Care Assistance Level of Assistance  Bathing, Feeding, Dressing Bathing Assistance: Limited assistance Feeding assistance: Limited assistance Dressing Assistance: Limited assistance     Functional Limitations Info  Sight, Hearing, Speech Sight Info: Adequate Hearing Info: Adequate Speech Info: Adequate    SPECIAL CARE FACTORS FREQUENCY  PT (By licensed PT)     PT Frequency: 5x a week              Contractures Contractures Info: Not present    Additional Factors Info  Code Status, Allergies, Psychotropic, Isolation Precautions Code Status Info: DNR Allergies Info: ALISKIREN-HYDROCHLOROTHIAZIDE, AMLODIPINE, SULFA ANTIBIOTICS  Psychotropic Info: PARoxetine (PAXIL) tablet 20 mg   Isolation Precautions Info: Contact precautions for MRSA     Current Medications (10/30/2016):  This is the current hospital active medication list Current Facility-Administered Medications  Medication Dose Route Frequency Provider Last Rate Last Dose  . 0.9 %  sodium chloride infusion  250 mL Intravenous PRN Ihor Austin, MD      . acetaminophen (TYLENOL) tablet 650 mg  650 mg Oral Q6H PRN Ihor Austin, MD       Or  . acetaminophen (TYLENOL) suppository 650 mg  650 mg Rectal Q6H PRN Ihor Austin, MD      . apixaban (  ELIQUIS) tablet 5 mg  5 mg Oral BID Ihor Austin, MD   5 mg at 10/30/16 1014  . Chlorhexidine Gluconate Cloth 2 % PADS 6 each  6 each Topical Q0600 Enedina Finner, MD   6 each at 10/29/16 [phone]  . ferrous sulfate tablet 325 mg  325 mg Oral Daily Pavan Pyreddy, MD   325 mg at 10/30/16 1011  . furosemide (LASIX) tablet 20 mg  20 mg Oral BID Enedina Finner, MD   20 mg at 10/30/16 1014  . guaiFENesin-dextromethorphan (ROBITUSSIN DM) 100-10 MG/5ML  syrup 5 mL  5 mL Oral Q4H PRN Enedina Finner, MD   5 mL at 10/30/16 0555  . ipratropium (ATROVENT) nebulizer solution 0.5 mg  0.5 mg Nebulization Q6H Ryan M Dunn, PA-C   0.5 mg at 10/30/16 1308  . levalbuterol (XOPENEX) nebulizer solution 0.63 mg  0.63 mg Nebulization Q6H Ryan M Dunn, PA-C   0.63 mg at 10/30/16 1309  . levothyroxine (SYNTHROID, LEVOTHROID) tablet 50 mcg  50 mcg Oral Daily Enedina Finner, MD   50 mcg at 10/30/16 1012  . metoprolol tartrate (LOPRESSOR) tablet 75 mg  75 mg Oral BID Raymon Mutton Dunn, PA-C   75 mg at 10/30/16 1012  . multivitamin with minerals tablet 1 tablet  1 tablet Oral Daily Ihor Austin, MD   1 tablet at 10/30/16 1013  . mupirocin ointment (BACTROBAN) 2 % 1 application  1 application Nasal BID Enedina Finner, MD   1 application at 10/30/16 1155  . ondansetron (ZOFRAN) tablet 4 mg  4 mg Oral Q6H PRN Ihor Austin, MD       Or  . ondansetron (ZOFRAN) injection 4 mg  4 mg Intravenous Q6H PRN Pavan Pyreddy, MD      . PARoxetine (PAXIL) tablet 10 mg  10 mg Oral Q M,W,F Pavan Pyreddy, MD   10 mg at 10/30/16 1013  . PARoxetine (PAXIL) tablet 20 mg  20 mg Oral Q T,Th,S,Su Pavan Pyreddy, MD   20 mg at 10/29/16 0914  . potassium chloride SA (K-DUR,KLOR-CON) CR tablet 40 mEq  40 mEq Oral BID Ihor Austin, MD   40 mEq at 10/30/16 1011  . senna-docusate (Senokot-S) tablet 1 tablet  1 tablet Oral QHS PRN Pavan Pyreddy, MD      . sodium chloride flush (NS) 0.9 % injection 3 mL  3 mL Intravenous Q12H Pavan Pyreddy, MD   3 mL at 10/30/16 1024  . sodium chloride flush (NS) 0.9 % injection 3 mL  3 mL Intravenous PRN Ihor Austin, MD   3 mL at 10/29/16 0512  . vancomycin (VANCOCIN) IVPB 1000 mg/200 mL premix  1,000 mg Intravenous Q24H Enedina Finner, MD 200 mL/hr at 10/30/16 0515 1,000 mg at 10/30/16 0515     Discharge Medications: Please see discharge summary for a list of discharge medications.  Relevant Imaging Results:  Relevant Lab Results:   Additional Information SSN  [id number]  Darleene Cleaver, [place]

## 2016-10-30 NOTE — Progress Notes (Signed)
[place] INFECTIOUS DISEASE PROGRESS NOTE Date of Admission:  10/27/2016     ID: [name] is a 83 y.o. female with MRSA bacteremia  Principal Problem:   Acute respiratory distress Active Problems:   HTN (hypertension)   Cognitive impairment   Chronic atrial fibrillation (HCC)   Atrial fibrillation with RVR (HCC)   Acute on chronic combined systolic and diastolic CHF (congestive heart failure) (HCC)  Subjective:  No fevers, echo negative. Is on dysphagia diet but still seems to be having some difficutly swallowing. Discussed with patients sister and pastor.   ROS  Eleven systems are reviewed and negative except per hpi  Medications:  Antibiotics Given (last 72 hours)    Date/Time Action Medication Dose Rate   10/27/16 2238 New Bag/Given   piperacillin-tazobactam (ZOSYN) IVPB 3.375 g 3.375 g 100 mL/hr   10/28/16 0238 New Bag/Given   clindamycin (CLEOCIN) IVPB 600 mg 600 mg 100 mL/hr   10/28/16 1235 New Bag/Given   clindamycin (CLEOCIN) IVPB 600 mg 600 mg 100 mL/hr   10/28/16 2136 New Bag/Given   vancomycin (VANCOCIN) IVPB 1000 mg/200 mL premix 1,000 mg 200 mL/hr   10/29/16 0511 New Bag/Given   vancomycin (VANCOCIN) IVPB 1000 mg/200 mL premix 1,000 mg 200 mL/hr   10/30/16 0515 New Bag/Given   vancomycin (VANCOCIN) IVPB 1000 mg/200 mL premix 1,000 mg 200 mL/hr     . apixaban  5 mg Oral BID  . Chlorhexidine Gluconate Cloth  6 each Topical Q0600  . ferrous sulfate  325 mg Oral Daily  . furosemide  20 mg Oral BID  . ipratropium  0.5 mg Nebulization Q6H  . levalbuterol  0.63 mg Nebulization Q6H  . levothyroxine  50 mcg Oral Daily  . metoprolol  75 mg Oral BID  . multivitamin with minerals  1 tablet Oral Daily  . mupirocin ointment  1 application Nasal BID  . PARoxetine  10 mg Oral Q M,W,F  . PARoxetine  20 mg Oral Q T,Th,S,Su  . potassium chloride SA  40 mEq Oral BID  . sodium chloride flush  3 mL Intravenous Q12H    Objective: Vital signs in last 24  hours: Temp:  [98.1 F (36.7 C)-99.5 F (37.5 C)] 98.1 F (36.7 C) (05/02 1201) Pulse Rate:  [64-107] 64 (05/02 1201) Resp:  [17-18] 18 (05/02 1201) BP: (106-124)/(47-88) 117/83 (05/02 1201) SpO2:  [92 %-100 %] 100 % (05/02 1201) FiO2 (%):  [28 %] 28 % (05/02 0824) Constitutional: awake but only minimally interactive, coughing HENT: [place]/AT, PERRLA, no scleral icterus Mouth/Throat: Oropharynx is clear and dry. No oropharyngeal exudate.  Cardiovascular: IRR< no MMR Pulmonary/Chest: poor air movement, rhonchi bil Neck = supple, no nuchal rigidity Abdominal: Soft. Bowel sounds are normal.  exhibits no distension. There is no tenderness.  Lymphadenopathy: no cervical adenopathy. No axillary adenopathy Neurological: awake but frail and min interactive Skin: L heel with scab over previous wound Psychiatric: a normal mood and affect.  behavior is normal.   Lab Results  Recent Labs  10/28/16 0725  WBC 9.0  HGB 11.5*  HCT 34.4*  NA 141  K 4.1  CL 103  CO2 29  BUN 23*  CREATININE 1.00    Microbiology: Results for orders placed or performed during the hospital encounter of 10/27/16  Culture, blood (routine x 2)     Status: Abnormal (Preliminary result)   Collection Time: 10/27/16  3:10 PM  Result Value Ref Range Status   Specimen Description BLOOD LEFT ASSIST CONTROL  Final  Special Requests BOTTLES DRAWN AEROBIC AND ANAEROBIC  BCAV  Final   Culture  Setup Time   Final    GRAM POSITIVE COCCI IN BOTH AEROBIC AND ANAEROBIC BOTTLES CRITICAL RESULT CALLED TO, READ BACK BY AND VERIFIED WITH: JASON ROBBINS AT 7169 ON 10/28/2016 JJB    Culture (A)  Final    STAPHYLOCOCCUS AUREUS SUSCEPTIBILITIES TO FOLLOW CULTURE REINCUBATED FOR BETTER GROWTH Performed at [facility], [place] [address] [place], [place] [address]    Report Status PENDING  Incomplete  Blood Culture ID Panel (Reflexed)     Status: Abnormal   Collection Time: 10/27/16  3:10 PM  Result Value Ref Range Status    Enterococcus species NOT DETECTED NOT DETECTED Final   Listeria monocytogenes NOT DETECTED NOT DETECTED Final   Staphylococcus species DETECTED (A) NOT DETECTED Final    Comment: CRITICAL RESULT CALLED TO, READ BACK BY AND VERIFIED WITH: JASON ROBBINS AT 1733 ON 10/28/2016 JJB    Staphylococcus aureus DETECTED (A) NOT DETECTED Final    Comment: Methicillin (oxacillin)-resistant Staphylococcus aureus (MRSA). MRSA is predictably resistant to beta-lactam antibiotics (except ceftaroline). Preferred therapy is vancomycin unless clinically contraindicated. Patient requires contact precautions if  hospitalized. CRITICAL RESULT CALLED TO, READ BACK BY AND VERIFIED WITH: JASON ROBBINS AT 1733 ON 10/28/16 JJB    Methicillin resistance DETECTED (A) NOT DETECTED Final    Comment: CRITICAL RESULT CALLED TO, READ BACK BY AND VERIFIED WITH: JASON ROBBINS AT 8101 ON 10/28/2016 JJB    Streptococcus species NOT DETECTED NOT DETECTED Final   Streptococcus agalactiae NOT DETECTED NOT DETECTED Final   Streptococcus pneumoniae NOT DETECTED NOT DETECTED Final   Streptococcus pyogenes NOT DETECTED NOT DETECTED Final   Acinetobacter baumannii NOT DETECTED NOT DETECTED Final   Enterobacteriaceae species NOT DETECTED NOT DETECTED Final   Enterobacter cloacae complex NOT DETECTED NOT DETECTED Final   Escherichia coli NOT DETECTED NOT DETECTED Final   Klebsiella oxytoca NOT DETECTED NOT DETECTED Final   Klebsiella pneumoniae NOT DETECTED NOT DETECTED Final   Proteus species NOT DETECTED NOT DETECTED Final   Serratia marcescens NOT DETECTED NOT DETECTED Final   Haemophilus influenzae NOT DETECTED NOT DETECTED Final   Neisseria meningitidis NOT DETECTED NOT DETECTED Final   Pseudomonas aeruginosa NOT DETECTED NOT DETECTED Final   Candida albicans NOT DETECTED NOT DETECTED Final   Candida glabrata NOT DETECTED NOT DETECTED Final   Candida krusei NOT DETECTED NOT DETECTED Final   Candida parapsilosis NOT DETECTED  NOT DETECTED Final   Candida tropicalis NOT DETECTED NOT DETECTED Final  Culture, blood (routine x 2)     Status: None (Preliminary result)   Collection Time: 10/27/16 10:30 PM  Result Value Ref Range Status   Specimen Description BLOOD LEFT ARM  Final   Special Requests BOTTLES DRAWN AEROBIC AND ANAEROBIC  BCAV  Final   Culture NO GROWTH 3 DAYS  Final   Report Status PENDING  Incomplete  MRSA PCR Screening     Status: Abnormal   Collection Time: 10/28/16  1:50 AM  Result Value Ref Range Status   MRSA by PCR POSITIVE (A) NEGATIVE Final    Comment:        The GeneXpert MRSA Assay (FDA approved for NASAL specimens only), is one component of a comprehensive MRSA colonization surveillance program. It is not intended to diagnose MRSA infection nor to guide or monitor treatment for MRSA infections. RESULT CALLED TO, READ BACK BY AND VERIFIED WITH: LISA ROMERO AT 0402 10/28/16.PMH  Culture, blood (single) w Reflex to ID Panel     Status: None (Preliminary result)   Collection Time: 10/29/16  4:31 PM  Result Value Ref Range Status   Specimen Description BLOOD  RT WRIST  Final   Special Requests   Final    Blood Culture results may not be optimal due to an inadequate volume of blood received in culture bottles   Culture NO GROWTH < 24 HOURS  Final   Report Status PENDING  Incomplete    Studies/Results: Dg Chest Port 1 View  Result Date: 10/30/2016 CLINICAL DATA:  Shortness of Breath EXAM: PORTABLE CHEST 1 VIEW COMPARISON:  10/27/2016 FINDINGS: Cardiomegaly. Focal airspace opacities noted in both lung bases, right greater than left. Right basilar opacities concerning for pneumonia. No effusions or acute bony abnormality. IMPRESSION: Bibasilar atelectasis or infiltrates. Right basilar opacity is concerning for pneumonia. Cardiomegaly. Electronically Signed   By: Rolm Baptise M.D.   On: 10/30/2016 09:52    Assessment/Plan: Jalacia Mattila is a 83 y.o. female with MRSA bacteremia in setting  of recent hospitalization for HCAP. Possible sources of bacteremia include her L heel wound although appears clean, PNA or possible IV access points from prior recent admission. She has no fevers and nml wbc. Repeat bcx ngtd, echo TTE neg. Clinically she also has evidence of aspiration with new R sided infiltrate and some difficulty swallowing.   Recommendations I would not do TEE since would be hazardous given her poor respiratory status so risk of decompensation if done.  If bcx negative tomorrow please  place picc Will plan on 2 -4 weeks IV therapy Add unasyn for probable aspiration PNA - discussed this with sister and pastor that this is likely to recur. .  Thank you very much for the consult. Will follow with you.  [place], [name]   10/30/2016, 4:09 PM

## 2016-10-30 NOTE — Progress Notes (Signed)
[facility] - [place] [facility]   PATIENT NAME: [name]    MR#:  [id number]  DATE OF BIRTH:  02-14-1929  SUBJECTIVE:  Came in with increasing SOB  sister in the room No fever Pt more alert and eating food.  Cough++  REVIEW OF SYSTEMS:   Review of Systems  Unable to perform ROS: Dementia   Tolerating Diet:yes Tolerating PT: pt is bed bound  DRUG ALLERGIES:   Allergies  Allergen Reactions  . [place]-Hydrochlorothiazide Other (See Comments)    Reaction: unknown  . Amlodipine Other (See Comments)    Reaction: unknown  . Sulfa Antibiotics Swelling    VITALS:  Blood pressure 117/83, pulse 64, temperature 98.1 F (36.7 C), temperature source Oral, resp. rate 18, height  (1.651 m), weight 71.2 kg (156 lb 14.4 oz), SpO2 100 %.  PHYSICAL EXAMINATION:   Physical Exam  GENERAL:  83 y.o.-year-old patient lying in the bed with no acute distress. overweight EYES: Pupils equal, round, reactive to light and accommodation. No scleral icterus. Extraocular muscles intact.  HEENT: Head atraumatic, normocephalic. Oropharynx and nasopharynx clear.  NECK:  Supple, no jugular venous distention. No thyroid enlargement, no tenderness.  LUNGS: Normal breath sounds bilaterally, no wheezing, rales, rhonchi. No use of accessory muscles of respiration.  CARDIOVASCULAR: S1, S2 normal. No murmurs, rubs, or gallops.  ABDOMEN: Soft, nontender, nondistended. Bowel sounds present. No organomegaly or mass.  EXTREMITIES: No cyanosis, clubbing or edema b/l.   Foot drop. Tiny dried scab on the left heel NEUROLOGIC: unable to assess due to cognitive decline but overall non focal PSYCHIATRIC:  patient is alert SKIN: No obvious rash, lesion, or ulcer.   LABORATORY PANEL:  CBC  Recent Labs Lab 10/28/16 0725  WBC 9.0  HGB 11.5*  HCT 34.4*  PLT 247    Chemistries   Recent Labs Lab 10/27/16 1510 10/28/16 0725  NA 142 141  K 3.6 4.1  CL 101 103  CO2 33*  29  GLUCOSE 110* 130*  BUN 25* 23*  CREATININE 0.86 1.00  CALCIUM 8.9 8.9  AST 42*  --   ALT 33  --   ALKPHOS 72  --   BILITOT 0.8  --    Cardiac Enzymes  Recent Labs Lab 10/28/16 1324  TROPONINI <0.03   RADIOLOGY:  Dg Chest Port 1 View  Result Date: 10/30/2016 CLINICAL DATA:  Shortness of Breath EXAM: PORTABLE CHEST 1 VIEW COMPARISON:  10/27/2016 FINDINGS: Cardiomegaly. Focal airspace opacities noted in both lung bases, right greater than left. Right basilar opacities concerning for pneumonia. No effusions or acute bony abnormality. IMPRESSION: Bibasilar atelectasis or infiltrates. Right basilar opacity is concerning for pneumonia. Cardiomegaly. Electronically Signed   By: Charlett Nose M.D.   On: 10/30/2016 09:52   ASSESSMENT AND PLAN:  [name]  is a 83 y.o. female with a known history of Chronic systolic heart failure, healthcare associated pneumonia in the past, hypertension, hyperlipidemia, Parkinson's disease, atrial fibrillation on liquids for antecolic patient was recently discharged on 09/26/2016 to [facility] after being treated at our hospital for healthcare associated pneumonia and systolic heart failure. Patient presented again today from the facility for increased shortness of breath and congestion. Per the family member she appeared more congested and wheezy  1. Acute on chronicSystolic heart failure -Patient came in with increasing shortness of breath and weight gain at [facility]. sHe was recently admitted about 4 weeks ago and when discharged she was discharged on 20 mg  of Lasix given her low pressure during the past admission. -Lasix 40 mg daily and 20 mg in the evening.---change to 20 mg bid -Sats are 100% on 2 L -Weaned to room air  2. MRSA positive blood culture 1/2  -IV vancomycin -TTE pending results -ID consulted--d/w Dr fitzgerald -?TEE  3. Right LL pneumonia appears aspiration -no fever Cough+ -pureed diet with nectar thick. -sister  understands this can be recurrent. She is ok to cont current diet.  4.Chronic atrial fibrillation Continue Toprol and patient's oral anticoagulation  5. Parkinson disease with cognitive decline  6. Discharge planning patient is from [facility]. Heart baseline according to the daughter is wheelchair and jeri chair. She does not walk.   Case discussed with Care Management/Social Worker. Management plans discussed with the patient, family and they are in agreement.  CODE STATUS: DNR  DVT Prophylaxis: eliquis  TOTAL TIME TAKING CARE OF THIS PATIENT: *40* minutes.  >50% time spent on counselling and coordination of care  POSSIBLE D/C IN  1-2 DAYS, DEPENDING ON CLINICAL CONDITION.  Note: This dictation was prepared with Dragon dictation along with smaller phrase technology. Any transcriptional errors that result from this process are unintentional.  [name] M.D on 10/30/2016 at 7:26 PM  Between 7am to 6pm - Pager - [phone]  After 6pm go to [email or web address] - Social research officer, government  [facility]  Office  [phone]  CC: Primary care physician; Tillman Abide, MD

## 2016-10-31 ENCOUNTER — Ambulatory Visit: Payer: Medicare Other | Admitting: Cardiovascular Disease

## 2016-10-31 DIAGNOSIS — J189 Pneumonia, unspecified organism: Secondary | ICD-10-CM

## 2016-10-31 DIAGNOSIS — R0602 Shortness of breath: Secondary | ICD-10-CM

## 2016-10-31 DIAGNOSIS — Z7189 Other specified counseling: Secondary | ICD-10-CM

## 2016-10-31 DIAGNOSIS — Z515 Encounter for palliative care: Secondary | ICD-10-CM

## 2016-10-31 DIAGNOSIS — G20 Parkinson's disease: Secondary | ICD-10-CM

## 2016-10-31 LAB — CULTURE, BLOOD (ROUTINE X 2)

## 2016-10-31 LAB — BASIC METABOLIC PANEL
ANION GAP: 7 (ref 5–15)
BUN: 27 mg/dL — AB (ref 6–20)
CO2: 36 mmol/L — AB (ref 22–32)
CREATININE: 0.85 mg/dL (ref 0.44–1.00)
Calcium: 8.6 mg/dL — ABNORMAL LOW (ref 8.9–10.3)
Chloride: 105 mmol/L (ref 101–111)
GLUCOSE: 89 mg/dL (ref 65–99)
POTASSIUM: 3.6 mmol/L (ref 3.5–5.1)
SODIUM: 148 mmol/L — AB (ref 135–145)

## 2016-10-31 NOTE — Care Management Note (Signed)
Case Management Note  Patient Details  Name: [name] MRN: [id number] Date of Birth: 1929/03/09  Subjective/Objective:                 Decline in status         Action/Plan:   Palliative care consult for hospice home placement  Expected Discharge Date:                  Expected Discharge Plan:     In-House Referral:     Discharge planning Services     Post Acute Care Choice:    Choice offered to:     DME Arranged:    DME Agency:     HH Arranged:    HH Agency:     Status of Service:     If discussed at [employer] Meetings, dates discussed:    Additional Comments:  Eber HongGreene, Nieves Barberi R, RN 10/31/2016, 3:25 PM

## 2016-10-31 NOTE — Progress Notes (Signed)
PT Cancellation Note  Patient Details Name: [name] MRN: [id number] DOB: 1928-12-03   Cancelled Treatment:    Reason Eval/Treat Not Completed: Patient's level of consciousness Spoke with nursing who reported that pt has been drowsy, minimally interactive.  Not likely appropriate for PT today.  Waiting on palliative care work up.  Malachi ProGalen R Adlee Paar, DPT 10/31/2016, 4:03 PM

## 2016-10-31 NOTE — Progress Notes (Signed)
Patient lying in bed very fatigued and sleeping most of the day. Will respond to voice but will not stay awake long enough to swallow medications, food, or liquids. Advised sister who is as bedside that at this time not safe to give patient anything by mouth until patient is more alert. Sister verbalized understanding.  MD aware.

## 2016-10-31 NOTE — Progress Notes (Signed)
Advance care planning  Discussed with patient's sister who is her healthcare power of attorney. Patient has dementia and is bedbound. Poor health at baseline. Now she has recurrent aspiration pneumonia, exacerbation of congestive heart failure. She seems to be worsening during the hospital stay according to sister. This is in spite of aggressive treatment with antibiotics and Lasix.  We discussed regarding CODE STATUS. Patient is DO NOT RESUSCITATE.  Advised that if patient continues to decline she will benefit from comfort care issues and hospice home. She would like to discuss palliative care team. Patient's sister had her husband go to hospice home for end-of-life care and is aware of this services.   Time spent 20 minutes

## 2016-10-31 NOTE — Progress Notes (Signed)
Patient Name: Anita BubaMartha Gutt Date of Encounter: 10/31/2016  [facility]  Hospital Problem List     Principal Problem:   Acute respiratory distress Active Problems:   HTN (hypertension)   Cognitive impairment   Chronic atrial fibrillation (HCC)   Atrial fibrillation with RVR (HCC)   Acute on chronic combined systolic and diastolic CHF (congestive heart failure) (HCC)     Subjective   Sleepy this morning. Minimal PO intake, difficulty swallowing. MRSA bacteremia-->subsequent blood cultures negative. CXR on 5/2 with possible right base PNA (possible aspiration). TTE not acute as below. Heart rates well controlled on current regimen.   Inpatient Medications    Scheduled Meds: . apixaban  5 mg Oral BID  . Chlorhexidine Gluconate Cloth  6 each Topical Q0600  . ferrous sulfate  325 mg Oral Daily  . furosemide  20 mg Oral BID  . ipratropium  0.5 mg Nebulization Q6H  . levalbuterol  0.63 mg Nebulization Q6H  . levothyroxine  50 mcg Oral Daily  . metoprolol  75 mg Oral BID  . multivitamin with minerals  1 tablet Oral Daily  . mupirocin ointment  1 application Nasal BID  . PARoxetine  10 mg Oral Q M,W,F  . PARoxetine  20 mg Oral Q T,Th,S,Su  . potassium chloride SA  40 mEq Oral BID  . sodium chloride flush  3 mL Intravenous Q12H   Continuous Infusions: . sodium chloride    . ampicillin-sulbactam (UNASYN) IV Stopped (10/31/16 0553)  . vancomycin Stopped (10/31/16 0624)   PRN Meds: sodium chloride, acetaminophen **OR** acetaminophen, guaiFENesin-dextromethorphan, ondansetron **OR** ondansetron (ZOFRAN) IV, senna-docusate, sodium chloride flush   Vital Signs    Vitals:   10/31/16 0218 10/31/16 0458 10/31/16 0816 10/31/16 0818  BP:  (!) 121/55 98/74   Pulse:  93 94 93  Resp:  18 17   Temp:  98.7 F (37.1 C) 97.8 F (36.6 C)   TempSrc:  Oral Oral   SpO2: 94% 98% 100% 99%  Weight:      Height:        Intake/Output Summary (Last 24 hours) at 10/31/16  1041 Last data filed at 10/31/16 0400  Gross per 24 hour  Intake              200 ml  Output                0 ml  Net              200 ml   Filed Weights   10/28/16 0117  Weight: 156 lb 14.4 oz (71.2 kg)    Physical Exam    GEN: Frail appearing, in no acute distress.  HEENT: Grossly normal.  Neck: Supple, no JVD, carotid bruits, or masses. Cardiac: Irregularly irregular, II/VI systolic murmur, no rubs, or gallops. No clubbing, cyanosis, edema.  Radials/DP/PT 2+ and equal bilaterally.  Respiratory:  Diminished breath soundsbilaterally with bibasilar crackles. On nasal cannula at 2 L. GI: Soft, nontender, nondistended, BS + x 4. MS: no deformity or atrophy. Skin: Warm and dry, no rash. Neuro:  Strength and sensation are intact. Psych: Somnolent.  Labs    CBC No results for input(s): WBC, NEUTROABS, HGB, HCT, MCV, PLT in the last 72 hours. Basic Metabolic Panel  Recent Labs  10/31/16 0404  NA 148*  K 3.6  CL 105  CO2 36*  GLUCOSE 89  BUN 27*  CREATININE 0.85  CALCIUM 8.6*   Liver Function Tests No results for  input(s): AST, ALT, ALKPHOS, BILITOT, PROT, ALBUMIN in the last 72 hours. No results for input(s): LIPASE, AMYLASE in the last 72 hours. Cardiac Enzymes  Recent Labs  10/28/16 1324  TROPONINI <0.03   BNP Invalid input(s): POCBNP D-Dimer No results for input(s): DDIMER in the last 72 hours. Hemoglobin A1C No results for input(s): HGBA1C in the last 72 hours. Fasting Lipid Panel No results for input(s): CHOL, HDL, LDLCALC, TRIG, CHOLHDL, LDLDIRECT in the last 72 hours. Thyroid Function Tests No results for input(s): TSH, T4TOTAL, T3FREE, THYROIDAB in the last 72 hours.  Invalid input(s): FREET3  Telemetry    Afib, 70s bpm - Personally Reviewed  ECG    n/a - Personally Reviewed  Radiology    Dg Chest Port 1 View  Result Date: 10/30/2016 CLINICAL DATA:  Shortness of Breath EXAM: PORTABLE CHEST 1 VIEW COMPARISON:  10/27/2016 FINDINGS:  Cardiomegaly. Focal airspace opacities noted in both lung bases, right greater than left. Right basilar opacities concerning for pneumonia. No effusions or acute bony abnormality. IMPRESSION: Bibasilar atelectasis or infiltrates. Right basilar opacity is concerning for pneumonia. Cardiomegaly. Electronically Signed   By: Charlett Nose M.D.   On: 10/30/2016 09:52    Cardiac Studies   TTE 10/30/2016: Study Conclusions  - Left ventricle: The cavity size was normal. Systolic function was   mildly reduced. The estimated ejection fraction was in the range   of 45% to 50%. Diffuse hypokinesis. The study is not technically   sufficient to allow evaluation of LV diastolic function. - Aortic valve: There was mild regurgitation. - Mitral valve: There was mild to moderate regurgitation. - Left atrium: The atrium was mildly dilated. - Right ventricle: Systolic function was normal. - Pulmonary arteries: Systolic pressure was mildly elevated. IVC   not well visualized. PA peak pressure: 38 mm Hg (S).  Impressions:  - Rhythm is atrial fibrillation.  Patient Profile     83 y.o. female with history of chronic atrial fibrillation on Eliquis, chronic systolic CHF, cognitive impairment, hypertension, gait instability now wheelchair-bound, Parkinson's disease, and anxiety/panic attack who was recently admitted to [facility] with multi-focal PNA returned to [facility] on 4/30 with a one-day history of increased SOB.  Assessment & Plan    1. Acute respiratory distress with hypoxia: -Likely multifactorial including bronchitis/aspiration pneumonia, possible mild acute on chronic CHF, and Afib with RVR -Wean supplemental oxygen as able per IM  2. Acute on chronic CHF: -Patient's sister reports the patient's weight had increased to 170 pounds after her discharge in late March and was placed back on old dose of Lasix 40/20 with improvement in volume status -PO Lasix 20 mg bid -She does not appear to be grossly volume  overloaded at this time -TTE as above -Gentle diuresis  -Strict IS & Os and daily weights  3. Chronic Afib with RVR: -Likely in the setting of the above -Heart rate controlled currently -Continue Lopressor to 75 mg bid -If needed for rate control could use digoxin -Eliquis -NoASA, no indication for dual therapy   4. MRSA bacteremia/bronchitis/aspiration pneumonia: -ID on board -Possible sources include L heel wound, PNA, possible IV source with multiple admissions -TTE not acute, ID prefers to avoid TEE given the above -Continue Xopenex and Atrovent given Afib with RVR in place of Duoneb -May need steroids, though this may exacerbate Afib -ABX per IM/ID -Incentive spirometer  -Long discussion with patient's sister and Dr. Mariah Milling today regarding GOC -Consider Palliative Care vs Hospice if not improving (sister still undecided)  Signed,  Eula Listen, PA-C [facility] Pager: [phone] 10/31/2016, 10:41 AM   Attending Note Patient seen and examined, agree with detailed note above,  Patient presentation and plan discussed on rounds.   Patient is minimally conversant on today's evaluation Able to open eyes for brief period of time, Unable to answer questions Difficult time taking her medications morning Unable to tolerate her meals She has thick nonproductive cough  On physical exam no JVD, lungs with coarse breath sounds throughout,  Heart rate irregularly irregular, no murmurs appreciated, abdomen soft, nondistended, legs in support boots, contractures  -----acute respiratory distress Likely with chronic aspiration,  MRSA bacteremia, pneumonia Given her weakened condition, outlook is poor Echocardiogram with normal LV function, mildly elevated right heart pressures Would hold Lasix for now given minimal fluid intake Long discussion concerning goals of care with patient's sister.  She does not feel ready to make any decisions concerning hospice care   Greater  than 50% was spent in counseling and coordination of care with patient Total encounter time 35 minutes or more   Signed: Dossie Arbour  M.D., Ph.D. [facility]

## 2016-10-31 NOTE — Plan of Care (Signed)
Problem: Activity: Goal: Capacity to carry out activities will improve Outcome: Not Progressing Patient in bed, very drowsy. Will respond to voice but has poor attention/concentration.

## 2016-10-31 NOTE — Progress Notes (Addendum)
[facility] - [place] at [facility]   PATIENT NAME: [name]    MR#:  [id number]  DATE OF BIRTH:  Aug 27, 1928  SUBJECTIVE:   Patient is drowsy. Poor appetite. Moaning.  Sister at bedside  REVIEW OF SYSTEMS:   Review of Systems  Unable to perform ROS: Dementia    DRUG ALLERGIES:   Allergies  Allergen Reactions  . Aliskiren-Hydrochlorothiazide Other (See Comments)    Reaction: unknown  . Amlodipine Other (See Comments)    Reaction: unknown  . Sulfa Antibiotics Swelling    VITALS:  Blood pressure 98/74, pulse 93, temperature 97.8 F (36.6 C), temperature source Oral, resp. rate 17, height 5\' 5"  (1.651 m), weight 71.2 kg (156 lb 14.4 oz), SpO2 99 %.  PHYSICAL EXAMINATION:   Physical Exam  GENERAL:  83 y.o.-year-old patient lying in the bed.  EYES: Pupils equal, round, reactive to light and accommodation. No scleral icterus. Extraocular muscles intact.  HEENT: Head atraumatic, normocephalic. Oropharynx and nasopharynx clear.  NECK:  Supple, no jugular venous distention. No thyroid enlargement, no tenderness.  LUNGS: Coarse breath sounds. Wheezing CARDIOVASCULAR: S1, S2 normal. No murmurs, rubs, or gallops.  ABDOMEN: Soft, nontender, nondistended. Bowel sounds present. No organomegaly or mass.  EXTREMITIES: No cyanosis, clubbing or edema b/l.   Foot drop. Tiny dried scab on the left heel NEUROLOGIC: unable to assess due to cognitive decline but overall non focal PSYCHIATRIC:  patient is Drowsy  LABORATORY PANEL:  CBC  Recent Labs Lab 10/28/16 0725  WBC 9.0  HGB 11.5*  HCT 34.4*  PLT 247    Chemistries   Recent Labs Lab 10/27/16 1510  10/31/16 0404  NA 142  < > 148*  K 3.6  < > 3.6  CL 101  < > 105  CO2 33*  < > 36*  GLUCOSE 110*  < > 89  BUN 25*  < > 27*  CREATININE 0.86  < > 0.85  CALCIUM 8.9  < > 8.6*  AST 42*  --   --   ALT 33  --   --   ALKPHOS 72  --   --   BILITOT 0.8  --   --   < > = values in this interval not  displayed. Cardiac Enzymes  Recent Labs Lab 10/28/16 1324  TROPONINI <0.03   RADIOLOGY:  Dg Chest Port 1 View  Result Date: 10/30/2016 CLINICAL DATA:  Shortness of Breath EXAM: PORTABLE CHEST 1 VIEW COMPARISON:  10/27/2016 FINDINGS: Cardiomegaly. Focal airspace opacities noted in both lung bases, right greater than left. Right basilar opacities concerning for pneumonia. No effusions or acute bony abnormality. IMPRESSION: Bibasilar atelectasis or infiltrates. Right basilar opacity is concerning for pneumonia. Cardiomegaly. Electronically Signed   By: Charlett Nose M.D.   On: 10/30/2016 09:52   ASSESSMENT AND PLAN:  [name]  is a 83 y.o. female with a known history of Chronic systolic heart failure, healthcare associated pneumonia in the past, hypertension, hyperlipidemia, Parkinson's disease, atrial fibrillation on liquids for antecolic patient was recently discharged on 09/26/2016 to [facility] after being treated at our hospital for healthcare associated pneumonia and systolic heart failure. Patient presented again today from the facility for increased shortness of breath and congestion. Per the family member she appeared more congested and wheezy  1. Acute on chronic Systolic heart failure On Lasix. Diuresed well. Likelihood of getting dehydrated due to poor oral intake. Needs close monitoring. Discussed with Dr. Mariah Milling.  2. MRSA positive blood culture  1/2  -IV vancomycin -TTE- No endocarditis -ID consulted- appreciate input -No TEE Due to poor respiratory status and advanced dementia Will need PICC line if we need to continue aggressive care.  3. Right LL pneumonia, aspiration - worsening -no fever Cough+ -pureed diet with nectar thick. -sister understands this can be recurrent. She is ok to cont current diet.  4.Chronic atrial fibrillation Continue Toprol and patient's oral anticoagulation  5. Parkinson disease with dementia  6. Acute hypoxic respiratory failure  due to CHF and pneumonia  CODE STATUS: DNR  DVT Prophylaxis: eliquis  Note: This dictation was prepared with Dragon dictation along with smaller phrase technology. Any transcriptional errors that result from this process are unintentional.  Milagros LollSudini, Deija Buhrman R M.D on 10/31/2016 at 11:45 AM  Between 7am to 6pm - Pager - [phone]  After 6pm go to [email or web address] - Social research officer, governmentpassword EPAS [facility]  [facility]  Office  [phone]  CC: Primary care physician; Tillman Abideichard Letvak, MD

## 2016-10-31 NOTE — Progress Notes (Signed)
[place] INFECTIOUS DISEASE PROGRESS NOTE Date of Admission:  10/27/2016     ID: [name] is a 83 y.o. female with MRSA bacteremia  Principal Problem:   Acute respiratory distress Active Problems:   HTN (hypertension)   Cognitive impairment   Chronic atrial fibrillation (HCC)   Atrial fibrillation with RVR (HCC)   Acute on chronic combined systolic and diastolic CHF (congestive heart failure) (HCC)  Subjective:  She is less arousable today per family but I was able to awaken her. Still with cough when awake  ROS  Eleven systems are reviewed and negative except per hpi  Medications:  Antibiotics Given (last 72 hours)    Date/Time Action Medication Dose Rate   10/28/16 2136 New Bag/Given   vancomycin (VANCOCIN) IVPB 1000 mg/200 mL premix 1,000 mg 200 mL/hr   10/29/16 0511 New Bag/Given   vancomycin (VANCOCIN) IVPB 1000 mg/200 mL premix 1,000 mg 200 mL/hr   10/30/16 0515 New Bag/Given   vancomycin (VANCOCIN) IVPB 1000 mg/200 mL premix 1,000 mg 200 mL/hr   10/30/16 1754 New Bag/Given   Ampicillin-Sulbactam (UNASYN) 3 g in sodium chloride 0.9 % 100 mL IVPB 3 g 200 mL/hr   10/30/16 2308 New Bag/Given   Ampicillin-Sulbactam (UNASYN) 3 g in sodium chloride 0.9 % 100 mL IVPB 3 g 200 mL/hr   10/31/16 0523 New Bag/Given   Ampicillin-Sulbactam (UNASYN) 3 g in sodium chloride 0.9 % 100 mL IVPB 3 g 200 mL/hr   10/31/16 0524 New Bag/Given   vancomycin (VANCOCIN) IVPB 1000 mg/200 mL premix 1,000 mg 200 mL/hr   10/31/16 1206 New Bag/Given   Ampicillin-Sulbactam (UNASYN) 3 g in sodium chloride 0.9 % 100 mL IVPB 3 g 200 mL/hr     . apixaban  5 mg Oral BID  . Chlorhexidine Gluconate Cloth  6 each Topical Q0600  . ferrous sulfate  325 mg Oral Daily  . furosemide  20 mg Oral BID  . ipratropium  0.5 mg Nebulization Q6H  . levalbuterol  0.63 mg Nebulization Q6H  . levothyroxine  50 mcg Oral Daily  . metoprolol  75 mg Oral BID  . multivitamin with minerals  1 tablet Oral Daily  .  mupirocin ointment  1 application Nasal BID  . PARoxetine  10 mg Oral Q M,W,F  . PARoxetine  20 mg Oral Q T,Th,S,Su  . potassium chloride SA  40 mEq Oral BID  . sodium chloride flush  3 mL Intravenous Q12H    Objective: Vital signs in last 24 hours: Temp:  [97.8 F (36.6 C)-99.4 F (37.4 C)] 98.5 F (36.9 C) (05/03 1319) Pulse Rate:  [93-101] 93 (05/03 0818) Resp:  [17-20] 17 (05/03 0816) BP: (98-121)/(55-74) 98/74 (05/03 0816) SpO2:  [93 %-100 %] 99 % (05/03 0818) Constitutional: awake but only minimally interactive, coughing HENT: [place]/AT, PERRLA, no scleral icterus Mouth/Throat: Oropharynx is clear and dry. No oropharyngeal exudate.  Cardiovascular: IRR< no MMR Pulmonary/Chest: poor air movement, rhonchi bil Neck = supple, no nuchal rigidity Abdominal: Soft. Bowel sounds are normal.  exhibits no distension. There is no tenderness.  Lymphadenopathy: no cervical adenopathy. No axillary adenopathy Neurological: awake but frail and min interactive Skin: L heel with scab over previous wound Psychiatric: a normal mood and affect.  behavior is normal.   Lab Results  Recent Labs  10/31/16 0404  NA 148*  K 3.6  CL 105  CO2 36*  BUN 27*  CREATININE 0.85    Microbiology: Results for orders placed or performed during the hospital  encounter of 10/27/16  Culture, blood (routine x 2)     Status: Abnormal   Collection Time: 10/27/16  3:10 PM  Result Value Ref Range Status   Specimen Description BLOOD LEFT ASSIST CONTROL  Final   Special Requests BOTTLES DRAWN AEROBIC AND ANAEROBIC  BCAV  Final   Culture  Setup Time   Final    GRAM POSITIVE COCCI IN BOTH AEROBIC AND ANAEROBIC BOTTLES CRITICAL RESULT CALLED TO, READ BACK BY AND VERIFIED WITH: JASON ROBBINS AT 6378 ON 10/28/2016 JJB    Culture (A)  Final    METHICILLIN RESISTANT STAPHYLOCOCCUS AUREUS STAPHYLOCOCCUS SPECIES (COAGULASE NEGATIVE) THE SIGNIFICANCE OF ISOLATING THIS ORGANISM FROM A SINGLE SET OF BLOOD CULTURES WHEN  MULTIPLE SETS ARE DRAWN IS UNCERTAIN. PLEASE NOTIFY THE MICROBIOLOGY DEPARTMENT WITHIN ONE WEEK IF SPECIATION AND SENSITIVITIES ARE REQUIRED. MRSA RECOVERED FROM ANAEROBIC BOTTLE ONLY. COAGULASE NEGATIVE STAPH SPECIES RECOVERED IN AEROBIC BOTTLE ONLY. Performed at [facility], [place] [address] [place], [place] [address]    Report Status 10/31/2016 FINAL  Final   Organism ID, Bacteria METHICILLIN RESISTANT STAPHYLOCOCCUS AUREUS  Final      Susceptibility   Methicillin resistant staphylococcus aureus - MIC*    CIPROFLOXACIN >=8 RESISTANT Resistant     ERYTHROMYCIN >=8 RESISTANT Resistant     GENTAMICIN <=0.5 SENSITIVE Sensitive     OXACILLIN >=4 RESISTANT Resistant     TETRACYCLINE <=1 SENSITIVE Sensitive     VANCOMYCIN <=0.5 SENSITIVE Sensitive     TRIMETH/SULFA <=10 SENSITIVE Sensitive     CLINDAMYCIN <=0.25 SENSITIVE Sensitive     RIFAMPIN <=0.5 SENSITIVE Sensitive     Inducible Clindamycin NEGATIVE Sensitive     * METHICILLIN RESISTANT STAPHYLOCOCCUS AUREUS  Blood Culture ID Panel (Reflexed)     Status: Abnormal   Collection Time: 10/27/16  3:10 PM  Result Value Ref Range Status   Enterococcus species NOT DETECTED NOT DETECTED Final   Listeria monocytogenes NOT DETECTED NOT DETECTED Final   Staphylococcus species DETECTED (A) NOT DETECTED Final    Comment: CRITICAL RESULT CALLED TO, READ BACK BY AND VERIFIED WITH: JASON ROBBINS AT 1733 ON 10/28/2016 JJB    Staphylococcus aureus DETECTED (A) NOT DETECTED Final    Comment: Methicillin (oxacillin)-resistant Staphylococcus aureus (MRSA). MRSA is predictably resistant to beta-lactam antibiotics (except ceftaroline). Preferred therapy is vancomycin unless clinically contraindicated. Patient requires contact precautions if  hospitalized. CRITICAL RESULT CALLED TO, READ BACK BY AND VERIFIED WITH: JASON ROBBINS AT 2774 ON 10/28/16 JJB    Methicillin resistance DETECTED (A) NOT DETECTED Final    Comment: CRITICAL RESULT CALLED TO,  READ BACK BY AND VERIFIED WITH: JASON ROBBINS AT 1287 ON 10/28/2016 JJB    Streptococcus species NOT DETECTED NOT DETECTED Final   Streptococcus agalactiae NOT DETECTED NOT DETECTED Final   Streptococcus pneumoniae NOT DETECTED NOT DETECTED Final   Streptococcus pyogenes NOT DETECTED NOT DETECTED Final   Acinetobacter baumannii NOT DETECTED NOT DETECTED Final   Enterobacteriaceae species NOT DETECTED NOT DETECTED Final   Enterobacter cloacae complex NOT DETECTED NOT DETECTED Final   Escherichia coli NOT DETECTED NOT DETECTED Final   Klebsiella oxytoca NOT DETECTED NOT DETECTED Final   Klebsiella pneumoniae NOT DETECTED NOT DETECTED Final   Proteus species NOT DETECTED NOT DETECTED Final   Serratia marcescens NOT DETECTED NOT DETECTED Final   Haemophilus influenzae NOT DETECTED NOT DETECTED Final   Neisseria meningitidis NOT DETECTED NOT DETECTED Final   Pseudomonas aeruginosa NOT DETECTED NOT DETECTED Final   Candida albicans NOT  DETECTED NOT DETECTED Final   Candida glabrata NOT DETECTED NOT DETECTED Final   Candida krusei NOT DETECTED NOT DETECTED Final   Candida parapsilosis NOT DETECTED NOT DETECTED Final   Candida tropicalis NOT DETECTED NOT DETECTED Final  Culture, blood (routine x 2)     Status: None (Preliminary result)   Collection Time: 10/27/16 10:30 PM  Result Value Ref Range Status   Specimen Description BLOOD LEFT ARM  Final   Special Requests BOTTLES DRAWN AEROBIC AND ANAEROBIC  BCAV  Final   Culture NO GROWTH 4 DAYS  Final   Report Status PENDING  Incomplete  MRSA PCR Screening     Status: Abnormal   Collection Time: 10/28/16  1:50 AM  Result Value Ref Range Status   MRSA by PCR POSITIVE (A) NEGATIVE Final    Comment:        The GeneXpert MRSA Assay (FDA approved for NASAL specimens only), is one component of a comprehensive MRSA colonization surveillance program. It is not intended to diagnose MRSA infection nor to guide or monitor treatment for MRSA  infections. RESULT CALLED TO, READ BACK BY AND VERIFIED WITH: LISA ROMERO AT 0402 10/28/16.PMH   Culture, blood (single) w Reflex to ID Panel     Status: None (Preliminary result)   Collection Time: 10/29/16  4:31 PM  Result Value Ref Range Status   Specimen Description BLOOD  RT WRIST  Final   Special Requests   Final    Blood Culture results may not be optimal due to an inadequate volume of blood received in culture bottles   Culture NO GROWTH 2 DAYS  Final   Report Status PENDING  Incomplete    Studies/Results: Dg Chest Port 1 View  Result Date: 10/30/2016 CLINICAL DATA:  Shortness of Breath EXAM: PORTABLE CHEST 1 VIEW COMPARISON:  10/27/2016 FINDINGS: Cardiomegaly. Focal airspace opacities noted in both lung bases, right greater than left. Right basilar opacities concerning for pneumonia. No effusions or acute bony abnormality. IMPRESSION: Bibasilar atelectasis or infiltrates. Right basilar opacity is concerning for pneumonia. Cardiomegaly. Electronically Signed   By: Rolm Baptise M.D.   On: 10/30/2016 09:52    Assessment/Plan: Desha Bitner is a 83 y.o. female with MRSA bacteremia in setting of recent hospitalization for HCAP. Possible sources of bacteremia include her L heel wound although appears clean, PNA or possible IV access points from prior recent admission. She has no fevers and nml wbc. Repeat bcx ngtd, echo TTE neg. Clinically she also has evidence of aspiration with new R sided infiltrate and some difficulty swallowing.  She is less responsive today and not taking much po. Sister and cousin and pastor at bedside. We discussed her decline and that fact that she may have some slight  improvement in the PNA since we just started unasyn yesterday BUT that the underlying issue is her continued decline and likely progressive dysphagia.  I do think she will have difficulty maintaining her po intake and her hydration and will continue to decline and I discussed this with her sister.  Her  sister states that she wishes to give her a little while prior to deciding on hospice home.  Recommendations Would hold on PICC Line for now. Cont vanco and unasyn Hopefully the family will decide in next 1-2 days which direction they wish to proceed.  Thank you very much for the consult. Will follow with you.  [place], [place]   10/31/2016, 3:37 PM

## 2016-10-31 NOTE — Consult Note (Signed)
Consultation Note Date: 10/31/2016   Patient Name: [name]  DOB: 04-23-1929  MRN: [id number]  Age / Sex: 83 y.o., female  PCP: Venia Carbon, MD Referring Physician: Hillary Bow, MD  Reason for Consultation: Establishing goals of care  HPI/Patient Profile: 83 y.o. female  with past medical history of CHF, Parkinson's, aspiration pneumonia   admitted on 10/27/2016 with recurrent aspiration pneumonia. Palliative medicine consulted for [place].   Clinical Assessment and Goals of Care: Met with patient's sister- [name].   Patient has been living at various facilities for most of her life. [name] has noticed a significant decline in [name]'s overall cognitive and functional status in the last year.   Discussion regarding continued aggressive therapy vs comfort measures was had. Discussion regarding GOC improving quality vs improving quantity of life was had. We discussed the general trajectory of dementia with resulting consequence of dysphagia and likelihood recurrent aspiration pneumonias. Patient would be eligible for residential hospice if transition was made to comfort measures.  [name] would like to speak with her pastor and with patient's other physician this evening and reconvene in the morning for [place]. She requests a delay in placing PICC.  Primary Decision Maker HCPOA- Burley Saver (sister)    SUMMARY OF RECOMMENDATIONS -delay PICC -continue current care -revisit GOC with patient's sister in the morning    Code Status/Advance Care Planning:  DNR   Palliative Prophylaxis:   Turn Reposition  Prognosis:    < 6 months d/t adv dementia, dysphagia, recurrent aspiration pneumonia  Discharge Planning: To Be Determined  Primary Diagnoses: Present on Admission: . HTN (hypertension) . Cognitive impairment . Atrial fibrillation with RVR ([place]) . Chronic atrial fibrillation (HCC)   I have  reviewed the medical record, interviewed the patient and family, and examined the patient. The following aspects are pertinent.  Past Medical History:  Diagnosis Date  . Anxiety disorder   . Arthritis   . Chronic systolic CHF (congestive heart failure) ([place])    a. echo 07/2014: EF 45-50%, dilated LA at 4.5 cm, mildly dilated RA, mod-sev MR, mod TR, mildly elevated PASP @ 45.2  . Cognitive impairment   . Depression   . Gait instability   . HTN (hypertension)   . Hyperlipidemia   . Panic attacks   . Parkinson's disease ([place])   . Persistent atrial fibrillation (HCC)    a. Eliquis 5 mg bid; b. CHADS2VASc = 5 (CHF, HTN, age x 2, female)   Social History   Social History  . Marital status: Single    Spouse name: N/A  . Number of children: N/A  . Years of education: N/A   Occupational History  . retired    Social History Main Topics  . Smoking status: Never Smoker  . Smokeless tobacco: Never Used  . Alcohol use No  . Drug use: No  . Sexual activity: Not Asked   Other Topics Concern  . None   Social History Narrative  . None   Family History  Problem Relation Age of Onset  .  Heart disease Sister    Scheduled Meds: . apixaban  5 mg Oral BID  . Chlorhexidine Gluconate Cloth  6 each Topical Q0600  . ferrous sulfate  325 mg Oral Daily  . furosemide  20 mg Oral BID  . ipratropium  0.5 mg Nebulization Q6H  . levalbuterol  0.63 mg Nebulization Q6H  . levothyroxine  50 mcg Oral Daily  . metoprolol  75 mg Oral BID  . multivitamin with minerals  1 tablet Oral Daily  . mupirocin ointment  1 application Nasal BID  . PARoxetine  10 mg Oral Q M,W,F  . PARoxetine  20 mg Oral Q T,Th,S,Su  . potassium chloride SA  40 mEq Oral BID  . sodium chloride flush  3 mL Intravenous Q12H   Continuous Infusions: . sodium chloride    . ampicillin-sulbactam (UNASYN) IV Stopped (10/31/16 1236)  . vancomycin Stopped (10/31/16 0624)   PRN Meds:.sodium chloride, acetaminophen **OR**  acetaminophen, guaiFENesin-dextromethorphan, ondansetron **OR** ondansetron (ZOFRAN) IV, senna-docusate, sodium chloride flush Medications Prior to Admission:  Prior to Admission medications   Medication Sig Start Date End Date Taking? Authorizing Provider  acetaminophen (TYLENOL) 325 MG tablet Take 650 mg by mouth every 4 (four) hours as needed for mild pain, moderate pain or fever.    Yes Historical Provider, MD  apixaban (ELIQUIS) 5 MG TABS tablet Take 1 tablet (5 mg total) by mouth 2 (two) times daily. 08/16/15  Yes Bettey Costa, MD  ferrous sulfate 325 (65 FE) MG EC tablet Take 325 mg by mouth daily.   Yes Historical Provider, MD  fexofenadine (ALLEGRA) 180 MG tablet Take 180 mg by mouth daily.   Yes Historical Provider, MD  furosemide (LASIX) 40 MG tablet Take 20-40 mg by mouth 2 (two) times daily. Take 67m qam and 235m@ 1300   Yes Historical Provider, MD  ipratropium-albuterol (DUONEB) 0.5-2.5 (3) MG/3ML SOLN Take 3 mLs by nebulization 2 (two) times daily. Patient taking differently: Take 3 mLs by nebulization 3 (three) times daily as needed.  12/17/15  Yes JoEarleen NewportMD  levothyroxine (SYNTHROID, LEVOTHROID) 50 MCG tablet Take 50 mcg by mouth every evening.    Yes Historical Provider, MD  metoprolol (LOPRESSOR) 50 MG tablet Take 50 mg by mouth 2 (two) times daily.  07/18/14  Yes Historical Provider, MD  Multiple Vitamin (MULTIVITAMIN) tablet Take 1 tablet by mouth daily.   Yes Historical Provider, MD  Nutritional Supplements (ARGINAID EXTRA) LIQD Take 1 Dose by mouth 2 (two) times daily.   Yes Historical Provider, MD  PARoxetine (PAXIL) 10 MG tablet Take 10 mg by mouth every Monday, Wednesday, and Friday.    Yes Historical Provider, MD  PARoxetine (PAXIL) 20 MG tablet Take 20 mg by mouth See admin instructions. Every tues, thurs, sat, sun   Yes Historical Provider, MD  potassium chloride SA (K-DUR,KLOR-CON) 20 MEQ tablet Take 40 mEq by mouth 2 (two) times daily.    Yes Historical  Provider, MD   Allergies  Allergen Reactions  . Aliskiren-Hydrochlorothiazide Other (See Comments)    Reaction: unknown  . Amlodipine Other (See Comments)    Reaction: unknown  . Sulfa Antibiotics Swelling   Review of Systems  Unable to perform ROS: Patient nonverbal    Physical Exam  Constitutional: She appears well-developed and well-nourished.  Lying in bed, moaning at times  Pulmonary/Chest: Effort normal and breath sounds normal.  Neurological:  Non verbal  Skin: There is pallor.  Nursing note and vitals reviewed.   Vital Signs:  BP 98/74 (BP Location: Left Arm)   Pulse 93   Temp 98.5 F (36.9 C) (Rectal)   Resp 17   Ht 5' 5"  (1.651 m)   Wt 71.2 kg (156 lb 14.4 oz)   SpO2 99%   BMI 26.11 kg/m  Pain Assessment: Faces POSS *See Group Information*: 1-Acceptable,Awake and alert Pain Score: 0-No pain   SpO2: SpO2: 99 % O2 Device:SpO2: 99 % O2 Flow Rate: .O2 Flow Rate (L/min): 1 L/min  IO: Intake/output summary:  Intake/Output Summary (Last 24 hours) at 10/31/16 1355 Last data filed at 10/31/16 0400  Gross per 24 hour  Intake              200 ml  Output                0 ml  Net              200 ml    LBM: Last BM Date: 10/28/16 Baseline Weight: Weight: 71.2 kg (156 lb 14.4 oz) Most recent weight: Weight: 71.2 kg (156 lb 14.4 oz)     Palliative Assessment/Data: PPS: 20 %     Thank you for this consult. Palliative medicine will continue to follow and assist as needed.   Time In: 1230 Time Out: 1400 Time Total: 90 minutes Greater than 50%  of this time was spent counseling and coordinating care related to the above assessment and plan.  Signed by: Mariana Kaufman, AGNP-C Palliative Medicine    Please contact Palliative Medicine Team phone at [phone] for questions and concerns.  For individual provider: See Shea Evans

## 2016-11-01 ENCOUNTER — Inpatient Hospital Stay: Payer: Medicare Other

## 2016-11-01 DIAGNOSIS — G20A1 Parkinson's disease without dyskinesia, without mention of fluctuations: Secondary | ICD-10-CM

## 2016-11-01 DIAGNOSIS — G20 Parkinson's disease: Secondary | ICD-10-CM

## 2016-11-01 DIAGNOSIS — J189 Pneumonia, unspecified organism: Secondary | ICD-10-CM

## 2016-11-01 DIAGNOSIS — Z7189 Other specified counseling: Secondary | ICD-10-CM

## 2016-11-01 DIAGNOSIS — Z515 Encounter for palliative care: Secondary | ICD-10-CM

## 2016-11-01 LAB — CULTURE, BLOOD (ROUTINE X 2): CULTURE: NO GROWTH

## 2016-11-01 LAB — VANCOMYCIN, TROUGH: VANCOMYCIN TR: 20 ug/mL (ref 15–20)

## 2016-11-01 MED ORDER — SODIUM CHLORIDE 0.9% FLUSH
10.0000 mL | Freq: Two times a day (BID) | INTRAVENOUS | Status: DC
  Administered 2016-11-01: 10 mL

## 2016-11-01 MED ORDER — SODIUM CHLORIDE 0.9% FLUSH: 10.0000 mL | INTRAVENOUS | Status: DC | PRN

## 2016-11-01 MED ORDER — METOPROLOL TARTRATE 50 MG PO TABS: 50.0000 mg | ORAL_TABLET | Freq: Two times a day (BID) | ORAL | Status: DC

## 2016-11-01 MED ORDER — FUROSEMIDE 40 MG PO TABS: 20.0000 mg | ORAL_TABLET | Freq: Two times a day (BID) | ORAL | Status: AC

## 2016-11-01 MED ORDER — IPRATROPIUM-ALBUTEROL 0.5-2.5 (3) MG/3ML IN SOLN: 3.0000 mL | Freq: Four times a day (QID) | RESPIRATORY_TRACT | Status: AC | PRN

## 2016-11-01 NOTE — Progress Notes (Signed)
Infectious Disease Long Term IV Antibiotic Orders  Diagnosis: MRSA bacteremia, Aspiration PNA  Culture results Order Status: Completed Specimen: Blood from BLOOD Updated: 10/31/16 0835   Specimen Description BLOOD LEFT ASSIST CONTROL   Special Requests BOTTLES DRAWN AEROBIC AND ANAEROBIC BCAV   Culture Setup Time --   GRAM POSITIVE COCCI  IN BOTH AEROBIC AND ANAEROBIC BOTTLES  CRITICAL RESULT CALLED TO, READ BACK BY AND VERIFIED WITH:  JASON ROBBINS AT 8413 ON 10/28/2016 JJB    Culture -- (A)   METHICILLIN RESISTANT STAPHYLOCOCCUS AUREUS  STAPHYLOCOCCUS SPECIES (COAGULASE NEGATIVE)  THE SIGNIFICANCE OF ISOLATING THIS ORGANISM FROM A SINGLE SET OF BLOOD CULTURES WHEN MULTIPLE SETS ARE DRAWN IS UNCERTAIN. PLEASE NOTIFY THE MICROBIOLOGY DEPARTMENT WITHIN ONE WEEK IF SPECIATION AND SENSITIVITIES ARE REQUIRED.  MRSA RECOVERED FROM ANAEROBIC BOTTLE ONLY.  COAGULASE NEGATIVE STAPH SPECIES RECOVERED IN AEROBIC BOTTLE ONLY.  Performed at [facility], [facility] [address], [place],  [address]    Report Status 10/31/2016 FINAL   Organism ID, Bacteria METHICILLIN RESISTANT STAPHYLOCOCCUS AUREUS  Culture & Susceptibility   METHICILLIN RESISTANT STAPHYLOCOCCUS AUREUS   Antibiotic Sensitivity Microscan Status  CIPROFLOXACIN Resistant >=8 RESISTANT Final  Method: MIC  CLINDAMYCIN Sensitive <=0.25 SENSITIVE Final  Method: MIC  ERYTHROMYCIN Resistant >=8 RESISTANT Final  Method: MIC  GENTAMICIN Sensitive <=0.5 SENSITIVE Final  Method: MIC  Inducible Clindamycin Sensitive NEGATIVE Final  Method: MIC  OXACILLIN Resistant >=4 RESISTANT Final  Method: MIC  RIFAMPIN Sensitive <=0.5 SENSITIVE Final  Method: MIC  TETRACYCLINE Sensitive <=1 SENSITIVE Final  Method: MIC  TRIMETH/SULFA Sensitive <=10 SENSITIVE Final  Method: MIC  VANCOMYCIN Sensitive <=0.5 SENSITIVE Final  Method: MIC  Comments METHICILLIN RESISTANT STAPHYLOCOCCUS AUREUS (MIC)   METHICILLIN RESISTANT STAPHYLOCOCCUS  AUREUS             Allergies:  Allergies  Allergen Reactions  . Aliskiren-Hydrochlorothiazide Other (See Comments)    Reaction: unknown  . Amlodipine Other (See Comments)    Reaction: unknown  . Sulfa Antibiotics Swelling    Discharge antibiotics Vancomycin          1000        mg  every       24        hours .     Goal vancomycin trough 15-20.    Pharmacy to adjust dosing based on levels Oral augmentin 875 bid until 5/8  PICC Care per protocol Labs weekly while on IV antibiotics      CBC w diff   Comprehensive met panel Vancomycin Trough     Planned duration of antibiotics 2 weeks from negative bcx  Stop date 11/12/16  Follow up clinic dateNo need to fu with ID, can follow with PCP  FAX weekly labs to [phone]  Leonel Ramsay, MD

## 2016-11-01 NOTE — Progress Notes (Signed)
New referral for Palliative service to follow at [facility] received from [facility] Greene. Plan is for PICC line placement today with possible discharge following placement. Patient information faxed to referral. Thank you. Dayna BarkerKaren Robertson RN, BSN, [facility] [place], [facility] [phone] c

## 2016-11-01 NOTE — Progress Notes (Signed)
Peripherally Inserted Central Catheter/Midline Placement  The IV Nurse has discussed with the patient and/or persons authorized to consent for the patient, the purpose of this procedure and the potential benefits and risks involved with this procedure.  The benefits include less needle sticks, lab draws from the catheter, and the patient may be discharged home with the catheter. Risks include, but not limited to, infection, bleeding, blood clot (thrombus formation), and puncture of an artery; nerve damage and irregular heartbeat and possibility to perform a PICC exchange if needed/ordered by physician.  Alternatives to this procedure were also discussed.  Bard Power PICC patient education guide, fact sheet on infection prevention and patient information card has been provided to patient /or left at bedside.  Consent signed by sister [name] due to altered mental status.  PICC/Midline Placement Documentation        [name], [name] 11/01/2016, 12:32 PM

## 2016-11-01 NOTE — Discharge Instructions (Signed)
Heart Failure Clinic appointment on Nov 07, 2016 at 11:00am with Clarisa Kindredina Hackney, FNP. Please call [phone] to reschedule.   Dysphagia 1 diet with Nectar thickened liquids

## 2016-11-01 NOTE — Progress Notes (Signed)
Held metoprolol, BP 106/49. Patient more alert today, responds to voice and was able to swallow medications. MD aware.

## 2016-11-01 NOTE — Care Management Note (Signed)
Case Management Note  Patient Details  Name: Elray BubaMartha Sutter MRN: [id number] Date of Birth: 1929-06-14  Subjective/Objective:                 More alert today.  Attending reports sister has decided to pursue IV antibiotics and hospice.  Anticipate discharge today. IV antibiotics and hospice may be conflict.  Palliative would be more appropriate  Action/Plan:   Return [facility] with [facility] and palliative care  Expected Discharge Date:                  Expected Discharge Plan:     In-House Referral:     Discharge planning Services     Post Acute Care Choice:    Choice offered to:     DME Arranged:    DME Agency:     HH Arranged:    HH Agency:     Status of Service:     If discussed at [employer] Meetings, dates discussed:    Additional Comments:  Eber HongGreene, Adriene Padula R, RN 11/01/2016, 8:52 AM

## 2016-11-01 NOTE — Progress Notes (Signed)
Patient more awake and ate some overnight. Sister wants PICC with abx and Hospice following at [facility]. Will see how she does with breakfast and oral intake

## 2016-11-01 NOTE — Discharge Summary (Addendum)
[facility] [facility] [place] [facility]   PATIENT NAME: [name]    MR#:  [id number]  DATE OF BIRTH:  [birth year]  DATE OF ADMISSION:  10/27/2016 ADMITTING PHYSICIAN: Ihor Austin, MD  DATE OF DISCHARGE: No discharge date for patient encounter.  PRIMARY CARE PHYSICIAN: Tillman Abide, MD   ADMISSION DIAGNOSIS:  Respiratory distress [R06.03] Healthcare-associated pneumonia [J18.9] Congestive heart failure, unspecified HF chronicity, unspecified heart failure type (HCC) [I50.9]  DISCHARGE DIAGNOSIS:  Principal Problem:   Acute respiratory distress Active Problems:   HTN (hypertension)   Cognitive impairment   Chronic atrial fibrillation (HCC)   Atrial fibrillation with RVR (HCC)   Acute on chronic combined systolic and diastolic CHF (congestive heart failure) (HCC)   SECONDARY DIAGNOSIS:   Past Medical History:  Diagnosis Date  . Anxiety disorder   . Arthritis   . Chronic systolic CHF (congestive heart failure) (HCC)    a. echo 07/2014: EF 45-50%, dilated LA at 4.5 cm, mildly dilated RA, mod-sev MR, mod TR, mildly elevated PASP @ 45.2  . Cognitive impairment   . Depression   . Gait instability   . HTN (hypertension)   . Hyperlipidemia   . Panic attacks   . Parkinson's disease (HCC)   . Persistent atrial fibrillation (HCC)    a. Eliquis 5 mg bid; b. CHADS2VASc = 5 (CHF, HTN, age x 2, female)     ADMITTING HISTORY  HISTORY OF PRESENT ILLNESS: [name]  is a 83 y.o. female with a known history of Chronic systolic heart failure, healthcare associated pneumonia in the past, hypertension, hyperlipidemia, Parkinson's disease, atrial fibrillation on liquids for antecolic patient was recently discharged on 09/26/2016 to [facility] after being treated at our hospital for healthcare associated pneumonia and systolic heart failure. Patient presented again today from the facility for increased shortness of breath and congestion. Per the family member she  appeared more congested and wheezy. Patient is awake and has some occasional cough. She is on pured diet at the [facility] facility. She was evaluated in the emergency room and had a CT angiogram of the chest showed vascular congestion and edema in the lungs. No evidence of any pulmonary embolism. Patient completed course of antibiotics during last admission. Patient is awake but not completely oriented to time place and person. Opens eyes and responds to verbal commands by nodding the head. Hospitalist service was consulted for further care of the patient.  HOSPITAL COURSE:   MarthaJobeis a 83 y.o.femalewith a known history of Chronic systolic heart failure, healthcare associated pneumonia in the past, hypertension, hyperlipidemia, Parkinson's disease, atrial fibrillation on liquids for antecolic patient was recently discharged on 09/26/2016 to [facility] after being treated at our hospital for healthcare associated pneumonia and systolic heart failure.Patient presented again today from the facility for increased shortness of breath and congestion.Per the family member she appeared more congested and wheezy  1.Acute on chronic Systolic heart failure On Lasix. Diuresed well. Discussed with Dr. Mariah Milling.  2.MRSA positive blood culture 1/2  -IV vancomycin -TTE- No endocarditis -ID consulted- appreciate input -No TEE Due to poor respiratory status and advanced dementia PICC line placement today. per ID Dr. Sampson Goon. Vancomycin          1000          mg  every       24        hours .     Goal vancomycin trough 15-20.    Pharmacy to adjust  dosing based on levels Oral augmentin 875 bid until 5/8  3. Right LL pneumonia, aspiration -no fever Cough+ -pureed diet with nectar thick. -sister understands this can be recurrent. She is ok to cont current diet. Improving. Augmentin  4.Chronic atrial fibrillation Continue Toprol and patient's oral anticoagulation  5.Parkinson  disease with dementia  6. Acute hypoxic respiratory failure due to CHF and pneumonia Wean O2 as tolerated  Stable for discharge to [facility] with Palliative care following  If any worsening will need hospice home  CONSULTS OBTAINED:  Treatment Team:  Iran Ouch, MD Mick Sell, MD  DRUG ALLERGIES:   Allergies  Allergen Reactions  . Aliskiren-Hydrochlorothiazide Other (See Comments)    Reaction: unknown  . Amlodipine Other (See Comments)    Reaction: unknown  . Sulfa Antibiotics Swelling    DISCHARGE MEDICATIONS:   Current Discharge Medication List    CONTINUE these medications which have CHANGED   Details  furosemide (LASIX) 40 MG tablet Take 0.5 tablets (20 mg total) by mouth 2 (two) times daily. Take 40mg  qam and 20mg  @ 1300 Qty: 30 tablet    ipratropium-albuterol (DUONEB) 0.5-2.5 (3) MG/3ML SOLN Take 3 mLs by nebulization every 6 (six) hours as needed.      CONTINUE these medications which have NOT CHANGED   Details  acetaminophen (TYLENOL) 325 MG tablet Take 650 mg by mouth every 4 (four) hours as needed for mild pain, moderate pain or fever.     apixaban (ELIQUIS) 5 MG TABS tablet Take 1 tablet (5 mg total) by mouth 2 (two) times daily. Qty: 60 tablet, Refills: 0    ferrous sulfate 325 (65 FE) MG EC tablet Take 325 mg by mouth daily.    fexofenadine (ALLEGRA) 180 MG tablet Take 180 mg by mouth daily.    levothyroxine (SYNTHROID, LEVOTHROID) 50 MCG tablet Take 50 mcg by mouth every evening.     metoprolol (LOPRESSOR) 50 MG tablet Take 50 mg by mouth 2 (two) times daily.     Multiple Vitamin (MULTIVITAMIN) tablet Take 1 tablet by mouth daily.    Nutritional Supplements (ARGINAID EXTRA) LIQD Take 1 Dose by mouth 2 (two) times daily.    !! PARoxetine (PAXIL) 10 MG tablet Take 10 mg by mouth every Monday, Wednesday, and Friday.     !! PARoxetine (PAXIL) 20 MG tablet Take 20 mg by mouth See admin instructions. Every tues, thurs, sat, sun     potassium chloride SA (K-DUR,KLOR-CON) 20 MEQ tablet Take 40 mEq by mouth 2 (two) times daily.      !! - Potential duplicate medications found. Please discuss with provider.     Vancomycin          1000          mg  every       24        hours .     Goal vancomycin trough 15-20.    Pharmacy to adjust dosing based on levels Oral augmentin 875 bid until 5/8  Today   VITAL SIGNS:  Blood pressure (!) 106/49, pulse 86, temperature 97.4 F (36.3 C), temperature source Oral, resp. rate 18, height 5\' 5"  (1.651 m), weight 71.2 kg (156 lb 14.4 oz), SpO2 100 %.  I/O:   Intake/Output Summary (Last 24 hours) at 11/01/16 1047 Last data filed at 11/01/16 0300  Gross per 24 hour  Intake              100 ml  Output  0 ml  Net              100 ml    PHYSICAL EXAMINATION:  Physical Exam  GENERAL:  83 y.o.-year-old patient lying in the bed. LUNGS: Bilateral coarse breath sounds CARDIOVASCULAR: S1, S2 normal. No murmurs, rubs, or gallops.  ABDOMEN: Soft, non-tender, non-distended. Bowel sounds present. No organomegaly or mass.  PSYCHIATRIC: The patient is awake. Confused  DATA REVIEW:   CBC  Recent Labs Lab 10/28/16 0725  WBC 9.0  HGB 11.5*  HCT 34.4*  PLT 247    Chemistries   Recent Labs Lab 10/27/16 1510  10/31/16 0404  NA 142  < > 148*  K 3.6  < > 3.6  CL 101  < > 105  CO2 33*  < > 36*  GLUCOSE 110*  < > 89  BUN 25*  < > 27*  CREATININE 0.86  < > 0.85  CALCIUM 8.9  < > 8.6*  AST 42*  --   --   ALT 33  --   --   ALKPHOS 72  --   --   BILITOT 0.8  --   --   < > = values in this interval not displayed.  Cardiac Enzymes  Recent Labs Lab 10/28/16 1324  TROPONINI <0.03    Microbiology Results  Results for orders placed or performed during the hospital encounter of 10/27/16  Culture, blood (routine x 2)     Status: Abnormal   Collection Time: 10/27/16  3:10 PM  Result Value Ref Range Status   Specimen Description BLOOD LEFT ASSIST CONTROL  Final    Special Requests BOTTLES DRAWN AEROBIC AND ANAEROBIC  BCAV  Final   Culture  Setup Time   Final    GRAM POSITIVE COCCI IN BOTH AEROBIC AND ANAEROBIC BOTTLES CRITICAL RESULT CALLED TO, READ BACK BY AND VERIFIED WITH: JASON ROBBINS AT 1733 ON 10/28/2016 JJB    Culture (A)  Final    METHICILLIN RESISTANT STAPHYLOCOCCUS AUREUS STAPHYLOCOCCUS SPECIES (COAGULASE NEGATIVE) THE SIGNIFICANCE OF ISOLATING THIS ORGANISM FROM A SINGLE SET OF BLOOD CULTURES WHEN MULTIPLE SETS ARE DRAWN IS UNCERTAIN. PLEASE NOTIFY THE MICROBIOLOGY DEPARTMENT WITHIN ONE WEEK IF SPECIATION AND SENSITIVITIES ARE REQUIRED. MRSA RECOVERED FROM ANAEROBIC BOTTLE ONLY. COAGULASE NEGATIVE STAPH SPECIES RECOVERED IN AEROBIC BOTTLE ONLY. Performed at [facility], [address] [place], [place] [address]    Report Status 10/31/2016 FINAL  Final   Organism ID, Bacteria METHICILLIN RESISTANT STAPHYLOCOCCUS AUREUS  Final      Susceptibility   Methicillin resistant staphylococcus aureus - MIC*    CIPROFLOXACIN >=8 RESISTANT Resistant     ERYTHROMYCIN >=8 RESISTANT Resistant     GENTAMICIN <=0.5 SENSITIVE Sensitive     OXACILLIN >=4 RESISTANT Resistant     TETRACYCLINE <=1 SENSITIVE Sensitive     VANCOMYCIN <=0.5 SENSITIVE Sensitive     TRIMETH/SULFA <=10 SENSITIVE Sensitive     CLINDAMYCIN <=0.25 SENSITIVE Sensitive     RIFAMPIN <=0.5 SENSITIVE Sensitive     Inducible Clindamycin NEGATIVE Sensitive     * METHICILLIN RESISTANT STAPHYLOCOCCUS AUREUS  Blood Culture ID Panel (Reflexed)     Status: Abnormal   Collection Time: 10/27/16  3:10 PM  Result Value Ref Range Status   Enterococcus species NOT DETECTED NOT DETECTED Final   Listeria monocytogenes NOT DETECTED NOT DETECTED Final   Staphylococcus species DETECTED (A) NOT DETECTED Final    Comment: CRITICAL RESULT CALLED TO, READ BACK BY AND VERIFIED WITH: JASON ROBBINS AT  1733 ON 10/28/2016 JJB    Staphylococcus aureus DETECTED (A) NOT DETECTED Final    Comment:  Methicillin (oxacillin)-resistant Staphylococcus aureus (MRSA). MRSA is predictably resistant to beta-lactam antibiotics (except ceftaroline). Preferred therapy is vancomycin unless clinically contraindicated. Patient requires contact precautions if  hospitalized. CRITICAL RESULT CALLED TO, READ BACK BY AND VERIFIED WITH: JASON ROBBINS AT 1733 ON 10/28/16 JJB    Methicillin resistance DETECTED (A) NOT DETECTED Final    Comment: CRITICAL RESULT CALLED TO, READ BACK BY AND VERIFIED WITH: JASON ROBBINS AT 1733 ON 10/28/2016 JJB    Streptococcus species NOT DETECTED NOT DETECTED Final   Streptococcus agalactiae NOT DETECTED NOT DETECTED Final   Streptococcus pneumoniae NOT DETECTED NOT DETECTED Final   Streptococcus pyogenes NOT DETECTED NOT DETECTED Final   Acinetobacter baumannii NOT DETECTED NOT DETECTED Final   Enterobacteriaceae species NOT DETECTED NOT DETECTED Final   Enterobacter cloacae complex NOT DETECTED NOT DETECTED Final   Escherichia coli NOT DETECTED NOT DETECTED Final   Klebsiella oxytoca NOT DETECTED NOT DETECTED Final   Klebsiella pneumoniae NOT DETECTED NOT DETECTED Final   Proteus species NOT DETECTED NOT DETECTED Final   Serratia marcescens NOT DETECTED NOT DETECTED Final   Haemophilus influenzae NOT DETECTED NOT DETECTED Final   Neisseria meningitidis NOT DETECTED NOT DETECTED Final   Pseudomonas aeruginosa NOT DETECTED NOT DETECTED Final   Candida albicans NOT DETECTED NOT DETECTED Final   Candida glabrata NOT DETECTED NOT DETECTED Final   Candida krusei NOT DETECTED NOT DETECTED Final   Candida parapsilosis NOT DETECTED NOT DETECTED Final   Candida tropicalis NOT DETECTED NOT DETECTED Final  Culture, blood (routine x 2)     Status: None (Preliminary result)   Collection Time: 10/27/16 10:30 PM  Result Value Ref Range Status   Specimen Description BLOOD LEFT ARM  Final   Special Requests BOTTLES DRAWN AEROBIC AND ANAEROBIC  BCAV  Final   Culture NO GROWTH 4 DAYS   Final   Report Status PENDING  Incomplete  MRSA PCR Screening     Status: Abnormal   Collection Time: 10/28/16  1:50 AM  Result Value Ref Range Status   MRSA by PCR POSITIVE (A) NEGATIVE Final    Comment:        The GeneXpert MRSA Assay (FDA approved for NASAL specimens only), is one component of a comprehensive MRSA colonization surveillance program. It is not intended to diagnose MRSA infection nor to guide or monitor treatment for MRSA infections. RESULT CALLED TO, READ BACK BY AND VERIFIED WITH: LISA ROMERO AT 0402 10/28/16.PMH   Culture, blood (single) w Reflex to ID Panel     Status: None (Preliminary result)   Collection Time: 10/29/16  4:31 PM  Result Value Ref Range Status   Specimen Description BLOOD  RT WRIST  Final   Special Requests   Final    Blood Culture results may not be optimal due to an inadequate volume of blood received in culture bottles   Culture NO GROWTH 2 DAYS  Final   Report Status PENDING  Incomplete    RADIOLOGY:  No results found.  Follow up with PCP in 1 week.  Management plans discussed with the patient, family and they are in agreement.  CODE STATUS:     Code Status Orders        Start     Ordered   10/28/16 0126  Do not attempt resuscitation (DNR)  Continuous    Question Answer Comment  In the event of cardiac  or respiratory ARREST Do not call a "code blue"   In the event of cardiac or respiratory ARREST Do not perform Intubation, CPR, defibrillation or ACLS   In the event of cardiac or respiratory ARREST Use medication by any route, position, wound care, and other measures to relive pain and suffering. May use oxygen, suction and manual treatment of airway obstruction as needed for comfort.      10/28/16 0125    Code Status History    Date Active Date Inactive Code Status Order ID Comments User Context   09/22/2016  5:57 PM 09/26/2016  8:06 PM DNR [id number]  Shaune Pollack, MD ED   09/18/2015  3:39 AM 09/20/2015  1:35 PM DNR [id number]   Ihor Austin, MD ED   08/13/2015 12:27 PM 08/16/2015  5:54 PM DNR [id number]  Milagros Loll, MD ED      TOTAL TIME TAKING CARE OF THIS PATIENT ON DAY OF DISCHARGE: more than 30 minutes.   Milagros Loll R M.D on 11/01/2016 at 10:47 AM  Between 7am to 6pm - Pager - [phone]  After 6pm go to [email or web address] - password [email or web address] [facility]  [facility]  Office  [phone]  CC: Primary care physician; Tillman Abide, MD  Note: This dictation was prepared with Dragon dictation along with smaller phrase technology. Any transcriptional errors that result from this process are unintentional.

## 2016-11-01 NOTE — Care Management Important Message (Signed)
Important Message  Patient Details  Name: Anita BubaMartha Kirk MRN: [id number] Date of Birth: September 11, 1928   Medicare Important Message Given:  Yes given to patient's sister   Eber HongGreene, Marlei Glomski R, RN 11/01/2016, 9:05 AM

## 2016-11-01 NOTE — Progress Notes (Signed)
ANTIBIOTIC CONSULT NOTE - INITIAL  Pharmacy Consult for Vancomycin Indication: bacteremia  Allergies  Allergen Reactions  . Aliskiren-Hydrochlorothiazide Other (See Comments)    Reaction: unknown  . Amlodipine Other (See Comments)    Reaction: unknown  . Sulfa Antibiotics Swelling    Patient Measurements: Height: 5\' 5"  (165.1 cm) Weight: 156 lb 14.4 oz (71.2 kg) IBW/kg (Calculated) : 57 Adjusted Body Weight: 62.7 kg   Vital Signs: Temp: 98.8 F (37.1 C) (05/04 0446) Temp Source: Oral (05/04 0446) BP: 127/68 (05/04 0509) Pulse Rate: 31 (05/04 0509) Intake/Output from previous day: 05/03 0701 - 05/04 0700 In: 100 [IV Piggyback:100] Out: 0  Intake/Output from this shift: Total I/O In: 100 [IV Piggyback:100] Out: 0   Labs:  Recent Labs  10/31/16 0404  CREATININE 0.85   Estimated Creatinine Clearance: 47 mL/min (by C-G formula based on SCr of 0.85 mg/dL).  Recent Labs  11/01/16 0554  [facility]     Microbiology: Recent Results (from the past 720 hour(s))  Culture, blood (routine x 2)     Status: Abnormal   Collection Time: 10/27/16  3:10 PM  Result Value Ref Range Status   Specimen Description BLOOD LEFT ASSIST CONTROL  Final   Special Requests BOTTLES DRAWN AEROBIC AND ANAEROBIC  BCAV  Final   Culture  Setup Time   Final    GRAM POSITIVE COCCI IN BOTH AEROBIC AND ANAEROBIC BOTTLES CRITICAL RESULT CALLED TO, READ BACK BY AND VERIFIED WITH: JASON ROBBINS AT 1733 ON 10/28/2016 JJB    Culture (A)  Final    METHICILLIN RESISTANT STAPHYLOCOCCUS AUREUS STAPHYLOCOCCUS SPECIES (COAGULASE NEGATIVE) THE SIGNIFICANCE OF ISOLATING THIS ORGANISM FROM A SINGLE SET OF BLOOD CULTURES WHEN MULTIPLE SETS ARE DRAWN IS UNCERTAIN. PLEASE NOTIFY THE MICROBIOLOGY DEPARTMENT WITHIN ONE WEEK IF SPECIATION AND SENSITIVITIES ARE REQUIRED. MRSA RECOVERED FROM ANAEROBIC BOTTLE ONLY. COAGULASE NEGATIVE STAPH SPECIES RECOVERED IN AEROBIC BOTTLE ONLY. Performed at [facility], [address] [place], [place] [address]    Report Status 10/31/2016 FINAL  Final   Organism ID, Bacteria METHICILLIN RESISTANT STAPHYLOCOCCUS AUREUS  Final      Susceptibility   Methicillin resistant staphylococcus aureus - MIC*    CIPROFLOXACIN >=8 RESISTANT Resistant     ERYTHROMYCIN >=8 RESISTANT Resistant     GENTAMICIN <=0.5 SENSITIVE Sensitive     OXACILLIN >=4 RESISTANT Resistant     TETRACYCLINE <=1 SENSITIVE Sensitive     VANCOMYCIN <=0.5 SENSITIVE Sensitive     TRIMETH/SULFA <=10 SENSITIVE Sensitive     CLINDAMYCIN <=0.25 SENSITIVE Sensitive     RIFAMPIN <=0.5 SENSITIVE Sensitive     Inducible Clindamycin NEGATIVE Sensitive     * METHICILLIN RESISTANT STAPHYLOCOCCUS AUREUS  Blood Culture ID Panel (Reflexed)     Status: Abnormal   Collection Time: 10/27/16  3:10 PM  Result Value Ref Range Status   Enterococcus species NOT DETECTED NOT DETECTED Final   Listeria monocytogenes NOT DETECTED NOT DETECTED Final   Staphylococcus species DETECTED (A) NOT DETECTED Final    Comment: CRITICAL RESULT CALLED TO, READ BACK BY AND VERIFIED WITH: JASON ROBBINS AT 1733 ON 10/28/2016 JJB    Staphylococcus aureus DETECTED (A) NOT DETECTED Final    Comment: Methicillin (oxacillin)-resistant Staphylococcus aureus (MRSA). MRSA is predictably resistant to beta-lactam antibiotics (except ceftaroline). Preferred therapy is vancomycin unless clinically contraindicated. Patient requires contact precautions if  hospitalized. CRITICAL RESULT CALLED TO, READ BACK BY AND VERIFIED WITH: JASON ROBBINS AT 1733 ON 10/28/16 JJB    Methicillin resistance  DETECTED (A) NOT DETECTED Final    Comment: CRITICAL RESULT CALLED TO, READ BACK BY AND VERIFIED WITH: JASON ROBBINS AT 1733 ON 10/28/2016 JJB    Streptococcus species NOT DETECTED NOT DETECTED Final   Streptococcus agalactiae NOT DETECTED NOT DETECTED Final   Streptococcus pneumoniae NOT DETECTED NOT DETECTED Final   Streptococcus pyogenes NOT  DETECTED NOT DETECTED Final   Acinetobacter baumannii NOT DETECTED NOT DETECTED Final   Enterobacteriaceae species NOT DETECTED NOT DETECTED Final   Enterobacter cloacae complex NOT DETECTED NOT DETECTED Final   Escherichia coli NOT DETECTED NOT DETECTED Final   Klebsiella oxytoca NOT DETECTED NOT DETECTED Final   Klebsiella pneumoniae NOT DETECTED NOT DETECTED Final   Proteus species NOT DETECTED NOT DETECTED Final   Serratia marcescens NOT DETECTED NOT DETECTED Final   Haemophilus influenzae NOT DETECTED NOT DETECTED Final   Neisseria meningitidis NOT DETECTED NOT DETECTED Final   Pseudomonas aeruginosa NOT DETECTED NOT DETECTED Final   Candida albicans NOT DETECTED NOT DETECTED Final   Candida glabrata NOT DETECTED NOT DETECTED Final   Candida krusei NOT DETECTED NOT DETECTED Final   Candida parapsilosis NOT DETECTED NOT DETECTED Final   Candida tropicalis NOT DETECTED NOT DETECTED Final  Culture, blood (routine x 2)     Status: None (Preliminary result)   Collection Time: 10/27/16 10:30 PM  Result Value Ref Range Status   Specimen Description BLOOD LEFT ARM  Final   Special Requests BOTTLES DRAWN AEROBIC AND ANAEROBIC  BCAV  Final   Culture NO GROWTH 4 DAYS  Final   Report Status PENDING  Incomplete  MRSA PCR Screening     Status: Abnormal   Collection Time: 10/28/16  1:50 AM  Result Value Ref Range Status   MRSA by PCR POSITIVE (A) NEGATIVE Final    Comment:        The GeneXpert MRSA Assay (FDA approved for NASAL specimens only), is one component of a comprehensive MRSA colonization surveillance program. It is not intended to diagnose MRSA infection nor to guide or monitor treatment for MRSA infections. RESULT CALLED TO, READ BACK BY AND VERIFIED WITH: LISA ROMERO AT 0402 10/28/16.PMH   Culture, blood (single) w Reflex to ID Panel     Status: None (Preliminary result)   Collection Time: 10/29/16  4:31 PM  Result Value Ref Range Status   Specimen Description BLOOD  RT  WRIST  Final   Special Requests   Final    Blood Culture results may not be optimal due to an inadequate volume of blood received in culture bottles   Culture NO GROWTH 2 DAYS  Final   Report Status PENDING  Incomplete    Medical History: Past Medical History:  Diagnosis Date  . Anxiety disorder   . Arthritis   . Chronic systolic CHF (congestive heart failure) (HCC)    a. echo 07/2014: EF 45-50%, dilated LA at 4.5 cm, mildly dilated RA, mod-sev MR, mod TR, mildly elevated PASP @ 45.2  . Cognitive impairment   . Depression   . Gait instability   . HTN (hypertension)   . Hyperlipidemia   . Panic attacks   . Parkinson's disease (HCC)   . Persistent atrial fibrillation (HCC)    a. Eliquis 5 mg bid; b. CHADS2VASc = 5 (CHF, HTN, age x 2, female)    Medications:  Scheduled:  . apixaban  5 mg Oral BID  . Chlorhexidine Gluconate Cloth  6 each Topical Q0600  . ferrous sulfate  325 mg Oral  Daily  . ipratropium  0.5 mg Nebulization Q6H  . levalbuterol  0.63 mg Nebulization Q6H  . levothyroxine  50 mcg Oral Daily  . metoprolol  75 mg Oral BID  . multivitamin with minerals  1 tablet Oral Daily  . mupirocin ointment  1 application Nasal BID  . PARoxetine  10 mg Oral Q M,W,F  . PARoxetine  20 mg Oral Q T,Th,S,Su  . potassium chloride SA  40 mEq Oral BID  . sodium chloride flush  3 mL Intravenous Q12H   Assessment: Pharmacy consulted to dose vancomycin in this 83 year old female with Staph Aureus (Mech A detected) growing in 1 of 4 anaerobic bottles. CrCl = 40 ml/min Ke = 0.04 hr-1 T1/2 = 18.2 hrs Vd = 43.9 L   Goal of Therapy:  Vancomycin trough level 15-20 mcg/ml  Plan:  Expected duration 7 days with resolution of temperature and/or normalization of WBC   Vancomcyin 1 gm IV X 1 ordered to be given on 4/30 @ 20:00. Vancomycin 1 gm IV Q24H ordered to start on 5/1 @ 6:00, ~ 10 hrs after 1st dose (stacked dosing). This pt will reach Css by 5/4 @ 12:00. Will draw 1st trough on 5/4  @ 0530, which will be very close to Css.   5/4 AM vanc level 20. Continue current regimen. F/u SCr with 5/6 AM labs.  Shawni Volkov S 11/01/2016,6:58 AM

## 2016-11-01 NOTE — Clinical Social Work Note (Signed)
Patient to be d/c'ed today to [facility].  Patient and family agreeable to plans will transport via ems RN to call report to [phone] room 304A.  Windell MouldingEric Olan Kurek, MSW, Theresia MajorsLCSWA [phone]

## 2016-11-03 LAB — CULTURE, BLOOD (SINGLE): Culture: NO GROWTH

## 2016-11-04 DIAGNOSIS — I5022 Chronic systolic (congestive) heart failure: Secondary | ICD-10-CM | POA: Diagnosis not present

## 2016-11-04 DIAGNOSIS — I482 Chronic atrial fibrillation: Secondary | ICD-10-CM

## 2016-11-04 DIAGNOSIS — B9562 Methicillin resistant Staphylococcus aureus infection as the cause of diseases classified elsewhere: Secondary | ICD-10-CM

## 2016-11-04 DIAGNOSIS — J690 Pneumonitis due to inhalation of food and vomit: Secondary | ICD-10-CM | POA: Diagnosis not present

## 2016-11-07 ENCOUNTER — Ambulatory Visit: Payer: Medicare Other | Admitting: Family

## 2016-11-13 DIAGNOSIS — Z452 Encounter for adjustment and management of vascular access device: Secondary | ICD-10-CM | POA: Diagnosis not present

## 2016-11-30 ENCOUNTER — Emergency Department: Payer: Medicare Other

## 2016-11-30 ENCOUNTER — Encounter: Payer: Self-pay | Admitting: Emergency Medicine

## 2016-11-30 ENCOUNTER — Emergency Department
Admission: EM | Admit: 2016-11-30 | Discharge: 2016-11-30 | Disposition: A | Discharge status: Home / Self Care | Payer: Medicare Other | Attending: Emergency Medicine | Admitting: Emergency Medicine

## 2016-11-30 DIAGNOSIS — I110 Hypertensive heart disease with heart failure: Secondary | ICD-10-CM | POA: Insufficient documentation

## 2016-11-30 DIAGNOSIS — Z79899 Other long term (current) drug therapy: Secondary | ICD-10-CM | POA: Diagnosis not present

## 2016-11-30 DIAGNOSIS — Y939 Activity, unspecified: Secondary | ICD-10-CM | POA: Diagnosis not present

## 2016-11-30 DIAGNOSIS — T17200A Unspecified foreign body in pharynx causing asphyxiation, initial encounter: Secondary | ICD-10-CM | POA: Insufficient documentation

## 2016-11-30 DIAGNOSIS — Y999 Unspecified external cause status: Secondary | ICD-10-CM | POA: Diagnosis not present

## 2016-11-30 DIAGNOSIS — Y929 Unspecified place or not applicable: Secondary | ICD-10-CM | POA: Diagnosis not present

## 2016-11-30 DIAGNOSIS — X58XXXA Exposure to other specified factors, initial encounter: Secondary | ICD-10-CM | POA: Insufficient documentation

## 2016-11-30 DIAGNOSIS — I5043 Acute on chronic combined systolic (congestive) and diastolic (congestive) heart failure: Secondary | ICD-10-CM | POA: Diagnosis not present

## 2016-11-30 DIAGNOSIS — T17908A Unspecified foreign body in respiratory tract, part unspecified causing other injury, initial encounter: Secondary | ICD-10-CM

## 2016-11-30 NOTE — Discharge Instructions (Signed)
You were evaluated after a witnessed aspiration event, and your exam and evaluation are reassuring today in emergency department. Return to the emergency department for any trouble breathing, fever, confusion or altered mental status, or any other symptoms concerning to you.

## 2016-11-30 NOTE — ED Triage Notes (Signed)
Pt to [facility] via [facility] d/t aspiration during dinner. Pt with hx same. Pt arrived in RR distress, audible crackles heard bilaterally

## 2016-11-30 NOTE — ED Provider Notes (Addendum)
[facility] Emergency Department Provider Note ____________________________________________   I have reviewed the triage vital signs and the triage nursing note.  HISTORY  Chief Complaint Aspiration   Historian Limited history from Patient, poor historian Sister provides history who was there at [facility] nursing home  HPI [name] is a 83 y.o. female with a history of Parkinson's disease, persistent A. fib on Eloquis, and recenthospitalization, discharged 11/01/16 for CHF, hypoxia, right lower lobe aspiration pneumonia, MRSA positive blood culture, presents today due to witnessed aspiration event this evening prior to arrival.  Sr. states that she was with her and had dinner with her and she is on a pure/nectar diet and eat just fine. She was reporting that she felt a little full after dinner. She apparently started heaving and had some emesis which was witnessed by nursing staff and the sister to be consistent with patient aspirating.  Patient herself is not complaining of any chest pain. Denies trouble breathing now.  She reportedly had a breathing treatment prior to arrival. She looks like she is breathing well according to the sister at this point compared to at the nursing home.    Past Medical History:  Diagnosis Date  . Anxiety disorder   . Arthritis   . Chronic systolic CHF (congestive heart failure) (HCC)    a. echo 07/2014: EF 45-50%, dilated LA at 4.5 cm, mildly dilated RA, mod-sev MR, mod TR, mildly elevated PASP @ 45.2  . Cognitive impairment   . Depression   . Gait instability   . HTN (hypertension)   . Hyperlipidemia   . Panic attacks   . Parkinson's disease (HCC)   . Persistent atrial fibrillation (HCC)    a. Eliquis 5 mg bid; b. CHADS2VASc = 5 (CHF, HTN, age x 2, female)    Patient Active Problem List   Diagnosis Date Noted  . Healthcare-associated pneumonia   . Parkinson disease (HCC)   . Palliative care by specialist   .  Advance care planning   . Acute respiratory distress 10/28/2016  . Acute on chronic combined systolic and diastolic CHF (congestive heart failure) (HCC) 10/28/2016  . Pressure injury of skin 09/23/2016  . Acute respiratory failure with hypercapnia (HCC) 09/23/2016  . Multifocal pneumonia 09/22/2016  . Altered mental state 09/18/2015  . UTI (lower urinary tract infection) 09/18/2015  . Pressure ulcer 09/18/2015  . Acute suppurative parotitis   . Atrial fibrillation with RVR (HCC)   . Hypokalemia   . Sepsis (HCC)   . Morbid obesity due to excess calories (HCC)   . Submandibular gland infection 08/13/2015  . Bilateral leg edema 10/24/2014  . Chronic atrial fibrillation (HCC) 08/23/2014  . Chronic systolic CHF (congestive heart failure) (HCC)   . HTN (hypertension)   . Cognitive impairment   . Gait instability   . Depression   . Panic attacks     Past Surgical History:  Procedure Laterality Date  . EYE SURGERY      Prior to Admission medications   Medication Sig Start Date End Date Taking? Authorizing Provider  acetaminophen (TYLENOL) 325 MG tablet Take 650 mg by mouth every 4 (four) hours as needed for mild pain, moderate pain or fever.     [provider]  apixaban (ELIQUIS) 5 MG TABS tablet Take 1 tablet (5 mg total) by mouth 2 (two) times daily. 08/16/15   Adrian Saran, MD  ferrous sulfate 325 (65 FE) MG EC tablet Take 325 mg by mouth daily.  [provider]  fexofenadine (ALLEGRA) 180 MG tablet Take 180 mg by mouth daily.    [provider]  furosemide (LASIX) 40 MG tablet Take 0.5 tablets (20 mg total) by mouth 2 (two) times daily. Take 40mg  qam and 20mg  @ 1300 11/01/16   Sudini, Srikar, MD  ipratropium-albuterol (DUONEB) 0.5-2.5 (3) MG/3ML SOLN Take 3 mLs by nebulization every 6 (six) hours as needed. 11/01/16   Milagros Loll, MD  levothyroxine (SYNTHROID, LEVOTHROID) 50 MCG tablet Take 50 mcg by mouth every evening.     [provider]   metoprolol (LOPRESSOR) 50 MG tablet Take 50 mg by mouth 2 (two) times daily.  07/18/14   [provider]  Multiple Vitamin (MULTIVITAMIN) tablet Take 1 tablet by mouth daily.    [provider]  Nutritional Supplements (ARGINAID EXTRA) LIQD Take 1 Dose by mouth 2 (two) times daily.    [provider]  PARoxetine (PAXIL) 10 MG tablet Take 10 mg by mouth every Monday, Wednesday, and Friday.     [provider]  PARoxetine (PAXIL) 20 MG tablet Take 20 mg by mouth See admin instructions. Every tues, thurs, sat, sun    [provider]  potassium chloride SA (K-DUR,KLOR-CON) 20 MEQ tablet Take 40 mEq by mouth 2 (two) times daily.     [provider]    Allergies  Allergen Reactions  . Aliskiren-Hydrochlorothiazide Other (See Comments)    Reaction: unknown  . Amlodipine Other (See Comments)    Reaction: unknown  . Sulfa Antibiotics Swelling    Family History  Problem Relation Age of Onset  . Heart disease Sister     Social History Social History  Substance Use Topics  . Smoking status: Never Smoker  . Smokeless tobacco: Never Used  . Alcohol use No    Review of Systems Limited due to patient is a strain.  No reported fevers. No chest pain. Denies shortness of breath.   ____________________________________________   PHYSICAL EXAM:  VITAL SIGNS: ED Triage Vitals  Enc Vitals Group     BP 11/30/16 1958 (!) 146/97     Pulse Rate 11/30/16 1958 70     Resp 11/30/16 1958 (!) 22     Temp 11/30/16 1958 97.5 F (36.4 C)     Temp Source 11/30/16 1958 Oral     SpO2 11/30/16 1958 99 %     Weight 11/30/16 2005 158 lb (71.7 kg)     Height 11/30/16 2005 5\' 3"  (1.6 m)     Head Circumference --      Peak Flow --      Pain Score --      Pain Loc --      Pain Edu? --      Excl. in GC? --      Constitutional: Alert and Interactive, but very poor historian she does have a baseline tremor. No acute distress. HEENT   Head:  Normocephalic and atraumatic.      Eyes: Conjunctivae are normal. Pupils equal and round.       Ears:         Nose: No congestion/rhinnorhea.   Mouth/Throat: Mucous membranes are moist.   Neck: No stridor. Cardiovascular/Chest: Irregularly irregular, normal rate.  No murmurs, rubs, or gallops. Respiratory: Normal respiratory effort without tachypnea nor retractions. Mild crackles at both bases. No wheezing. Gastrointestinal: Soft. No distention, no guarding, no rebound. Nontender.   Genitourinary/rectal:Deferred Musculoskeletal: Nontender with normal range of motion in all extremities. No joint effusions.  No lower extremity tenderness. Trace lower extremity edema bilaterally. Neurologic:  No facial droop. Normal speech and language. Poor historian. She does have a tremor both upper extremities, right side somewhat worse. She is using 4 extremities with no gross or focal neurologic deficits are appreciated. Skin:  Skin is warm, dry and intact. No rash noted.   ____________________________________________  LABS (pertinent positives/negatives)  Labs Reviewed - No data to display  ____________________________________________    EKG I, Governor Rooksebecca Kaya Klausing, MD, the attending physician have personally viewed and interpreted all ECGs.  119 bpm.  Afib rvr.  Narrow qrs.  Nonspecific st and t wave. ____________________________________________  RADIOLOGY All Xrays were viewed by me. Imaging interpreted by Radiologist.  Chest x-ray two-view:  FINDINGS: Stable mild cardiomegaly. No evidence of pulmonary infiltrate or edema. No evidence of pneumothorax or pleural effusion. Aortic atherosclerosis. Mild thoracic spine degenerative changes.  IMPRESSION: Stable mild cardiomegaly. No active lung disease. __________________________________________  PROCEDURES  Procedure(s) performed: None  Critical Care performed: None  ____________________________________________   ED COURSE /  ASSESSMENT AND PLAN  Pertinent labs & imaging results that were available during my care of the patient were reviewed by me and considered in my medical decision making (see chart for details).    Ms. [name] was sent in after what sounds like a witnessed aspiration event. It sounds like she was probably wheezing and I'm not sure she was hypoxic at the time, but she has her percent oxygenation on room air here. She is not in any respirations distress. She does have some mild crackles on the bases, unclear if this would be similar to prior or acute. In any case with neurosurgery distress, normal oxygenation, heart rate in the 90s, I think it's okay for discharge home at this point time.  I did spend quite a significant amount of time with the sister explaining that aspiration will likely continue to happen and that in this case, she appears to be okay from the initial aspiration event tonight, and that watching and observing for any signs of potential complications such as aspiration pneumonia will be monitored on going to the future. No indication for hospitalization at this point time. Patient will be discharged back [facility] to [facility].    CONSULTATIONS:  None  Patient / Family / Caregiver informed of clinical course, medical decision-making process, and agree with plan.   I discussed return precautions, follow-up instructions, and discharge instructions with patient and/or family.  Discharge Instructions : You were evaluated after a witnessed aspiration event, andyour exam and evaluation are reassuring today in emergency department. Return to the emergency department for any trouble breathing, fever, confusion or altered mental status, or any other symptoms concerning to you.  ___________________________________________   FINAL CLINICAL IMPRESSION(S) / ED DIAGNOSES   Final diagnoses:  Aspiration into airway, initial encounter              Note: This dictation was prepared  with Dragon dictation. Any transcriptional errors that result from this process are unintentional    Governor RooksLord, Montel Vanderhoof, MD 11/30/16 2222    Governor RooksLord, Hanah Moultry, MD 12/19/16 [phone]

## 2016-11-30 NOTE — ED Notes (Addendum)
Pt. Verbalizes understanding of d/c instructions and follow-up. VS stable and pt denies pain.  Pt. In NAD at time of d/c and denies further concerns regarding this visit. Pt. Stable at the time of departure from the unit, departing unit by the safest and most appropriate manner per that pt condition and limitations. Pt advised to return to the ED at any time for emergent concerns, or for new/worsening symptoms.    Pt family and facility also verbalized understanding of d/c instructions reviewed.

## 2016-12-02 DIAGNOSIS — R0600 Dyspnea, unspecified: Secondary | ICD-10-CM

## 2016-12-30 ENCOUNTER — Telehealth: Payer: Self-pay

## 2016-12-30 NOTE — Telephone Encounter (Signed)
Note sent to Dr Alphonsus SiasLetvak and Dr Dayton MartesAron; Dr Alphonsus SiasLetvak out of office on 12/30/16 with no computer access. Dr Alphonsus SiasLetvak to return on 12/31/16.

## 2016-12-30 NOTE — Telephone Encounter (Signed)
PLEASE NOTE: All timestamps contained within this report are represented as [place] Standard Time. CONFIDENTIALTY NOTICE: This fax transmission is intended only for the addressee. It contains information that is legally privileged, confidential or otherwise protected from use or disclosure. If you are not the intended recipient, you are strictly prohibited from reviewing, disclosing, copying using or disseminating any of this information or taking any action in reliance on or regarding this information. If you have received this fax in error, please notify us immediately by telephone so that we can arrange for its return to us. Phone: [phone], Toll-Free: [phone], Fax: [phone] Page: 1 of 1 Call Id: [id number] [facility] Client Nonclinical Telephone Record [facility] Client [facility] - Day Physician Tillman AbideLetvak, Richard - MD Contact Type Call Who Is Calling Physician / Provider / Hospital Call Type Provider Call Message Only Reason for Call Request to send message to Office Initial Comment Judeth CornfieldStephanie with [facility] at [phone]. Pt deceased. Pt: Anita BubaMartha Kirk, DOC: 08/18/1928. Declined on call and requested to notify office only. Additional Comment Call Closed By: Rowe ClackEada Webb Transaction Date/Time: 01/09/2017 6:34:11 PM (ET)

## 2016-12-30 NOTE — Telephone Encounter (Signed)
Noted I will try to reach her sister to pass on my condolences

## 2016-12-31 NOTE — Telephone Encounter (Signed)
Did speak to sister and had discussion, etc

## 2017-01-29 DEATH — deceased

## 2017-12-15 IMAGING — CR DG CHEST 2V
1 series · 2 of 2 positions shown · non-contrast
Comparison: CT dated 06/07/2015

CLINICAL DATA: 86-year-old female with cough and shortness of
breath and wheezing.

EXAM:
CHEST  2 VIEW

[Series 1: dg chest 2 view · 0.14mm/px · 2 of 2 slices shown]
[im 1/2]
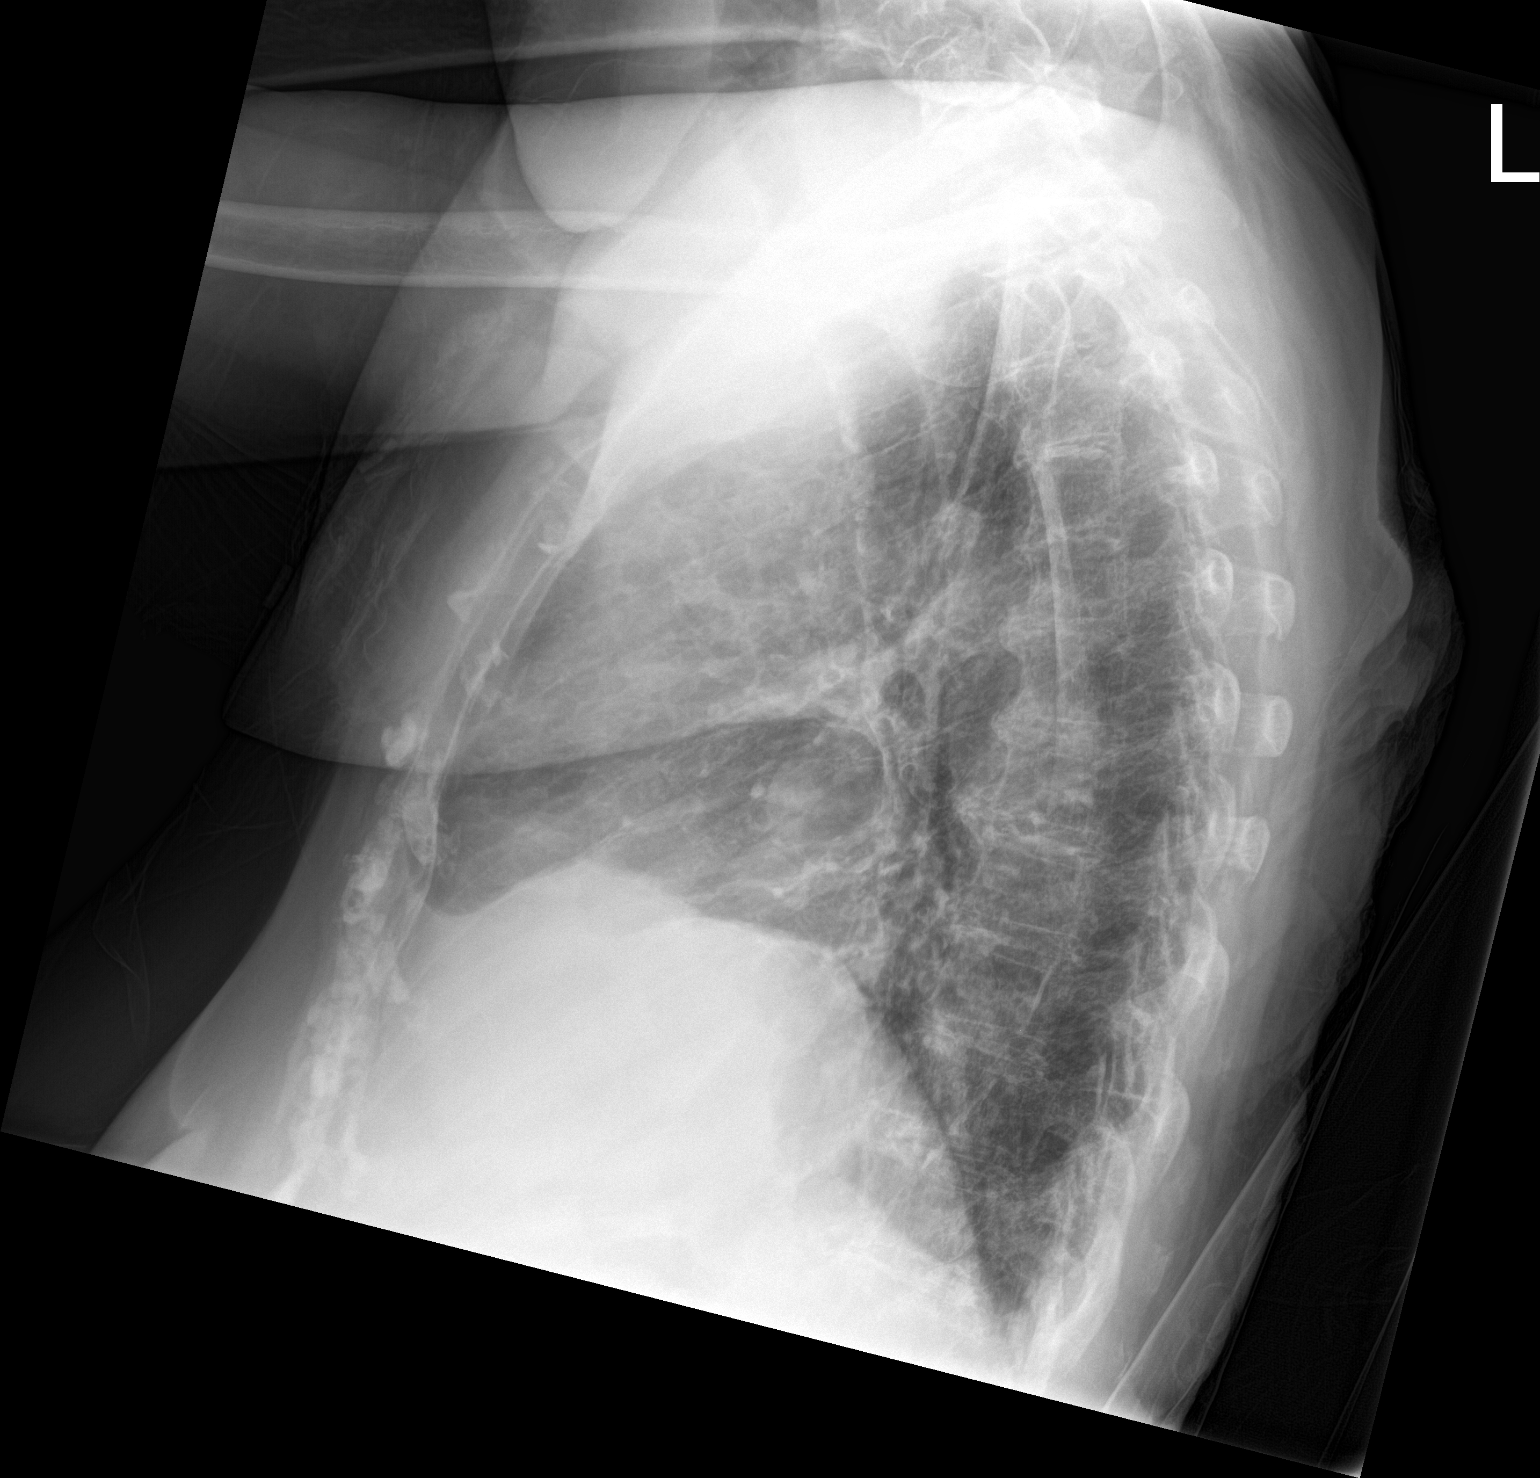
[im 2/2]
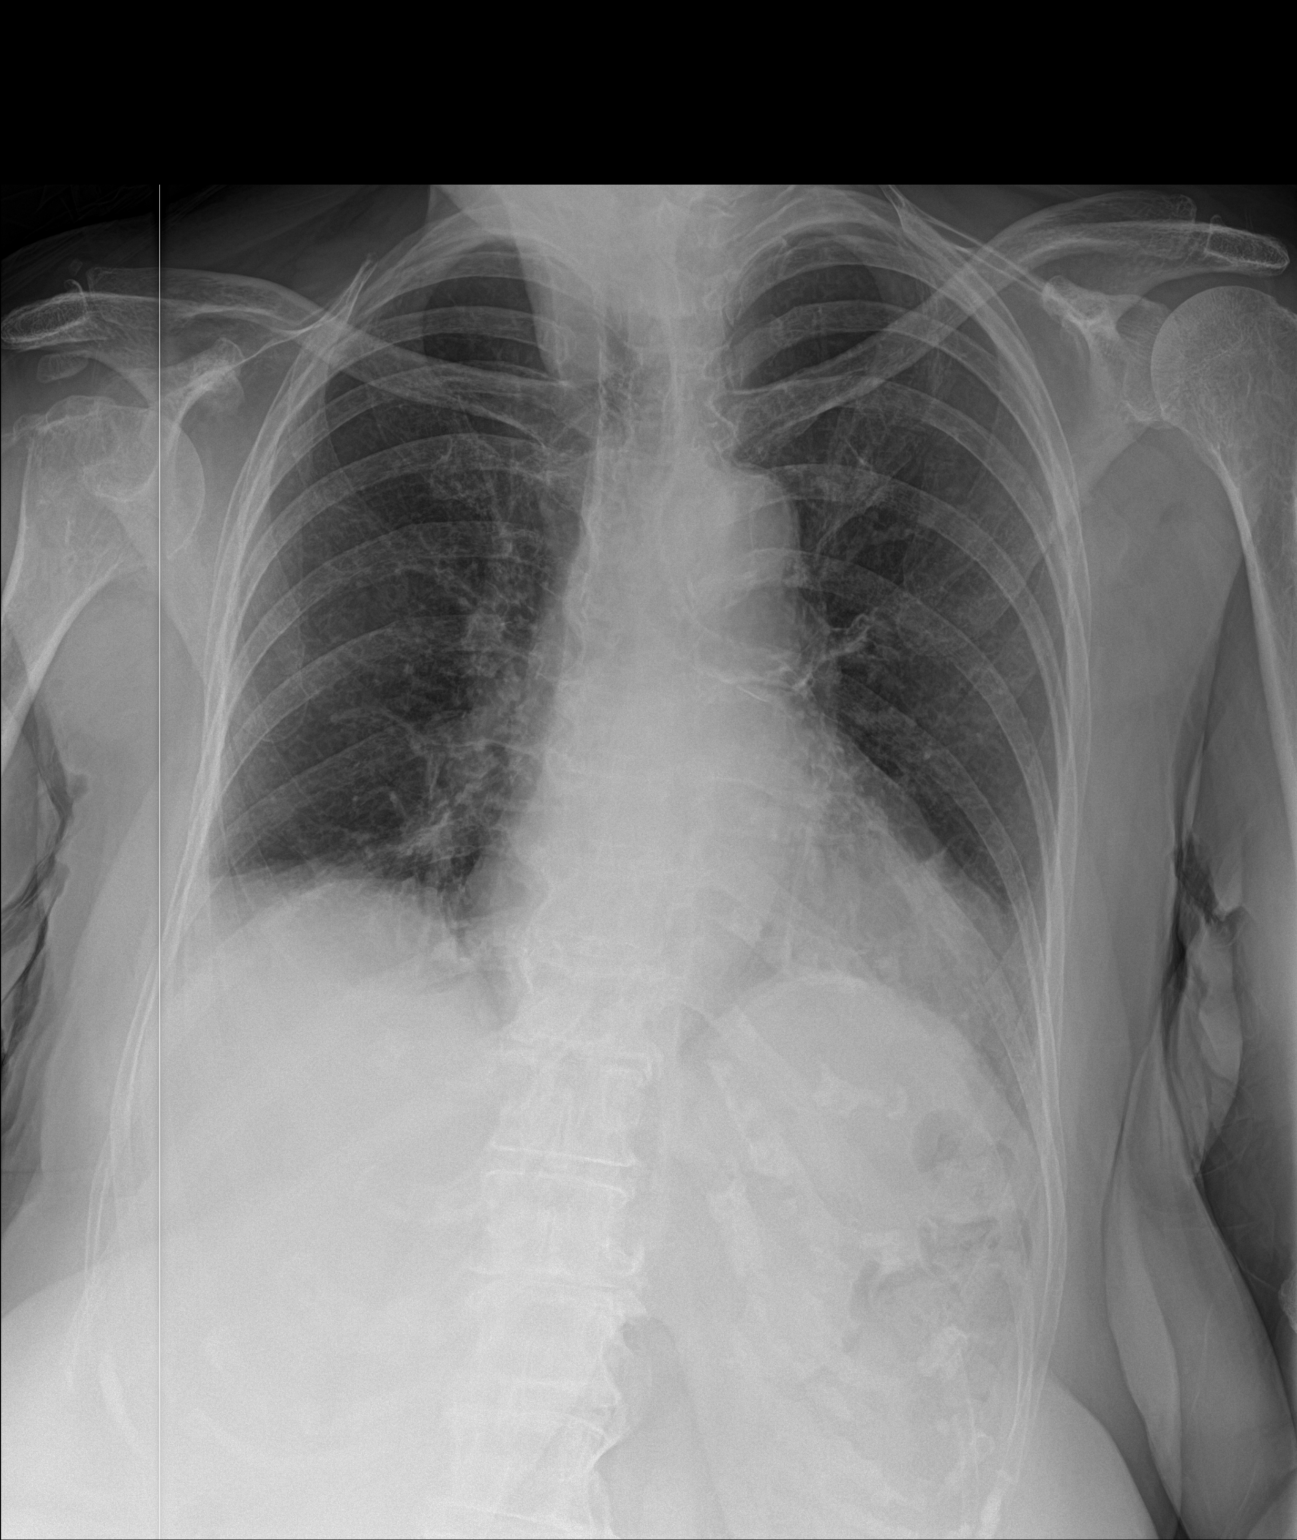

[2 of 2 positions shown; findings below may reference images not displayed]

FINDINGS: The heart size and mediastinal contours are within normal limits.
Both lungs are clear. The visualized skeletal structures are
unremarkable.
IMPRESSION: No active cardiopulmonary disease.

## 2018-06-07 IMAGING — CR DG CHEST 2V
2 series · 2 of 2 positions shown · non-contrast
Comparison: Chest radiograph performed 09/17/2015

CLINICAL DATA: Acute onset of bilateral leg swelling. Shortness of
breath and wheezing. Initial encounter.

EXAM:
CHEST  2 VIEW

[chest lat]
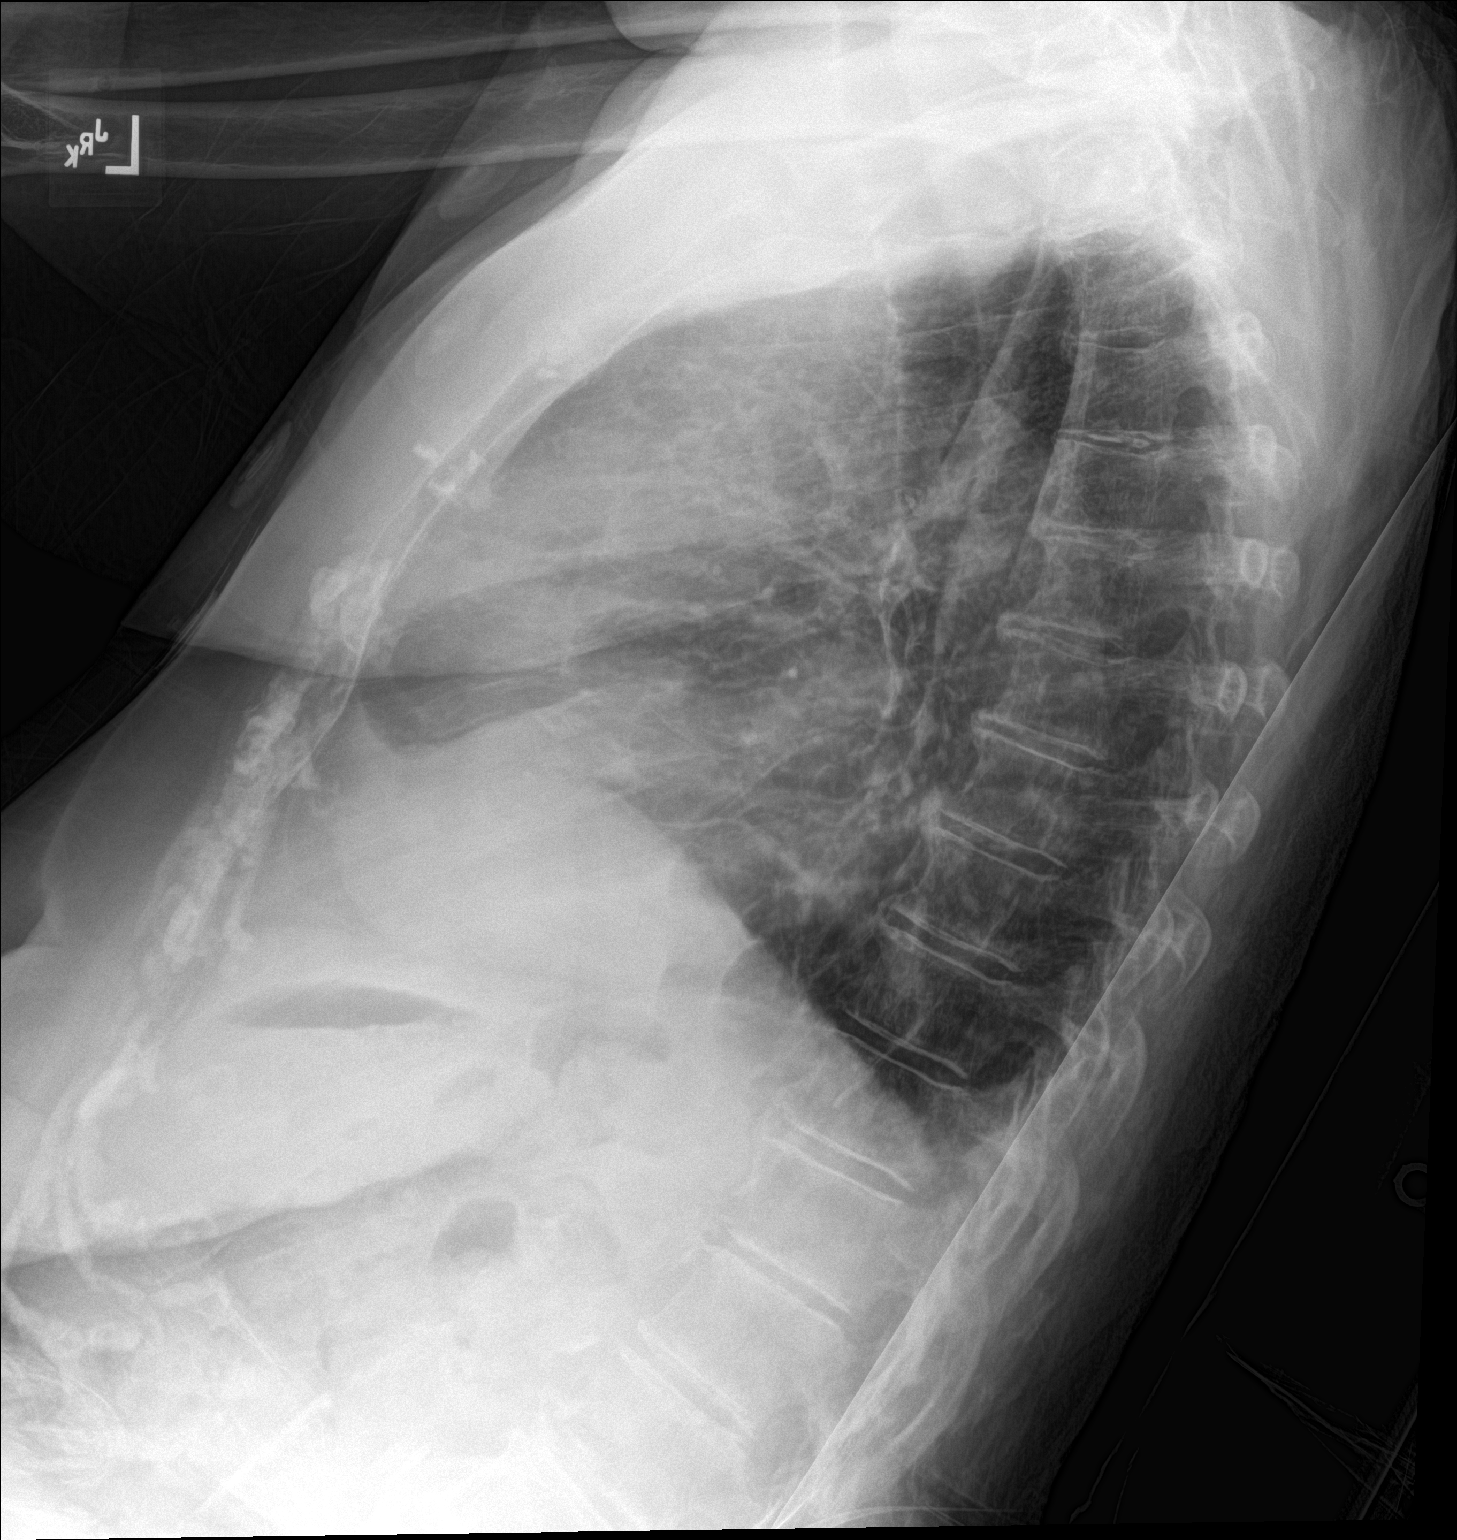

[chest ap]
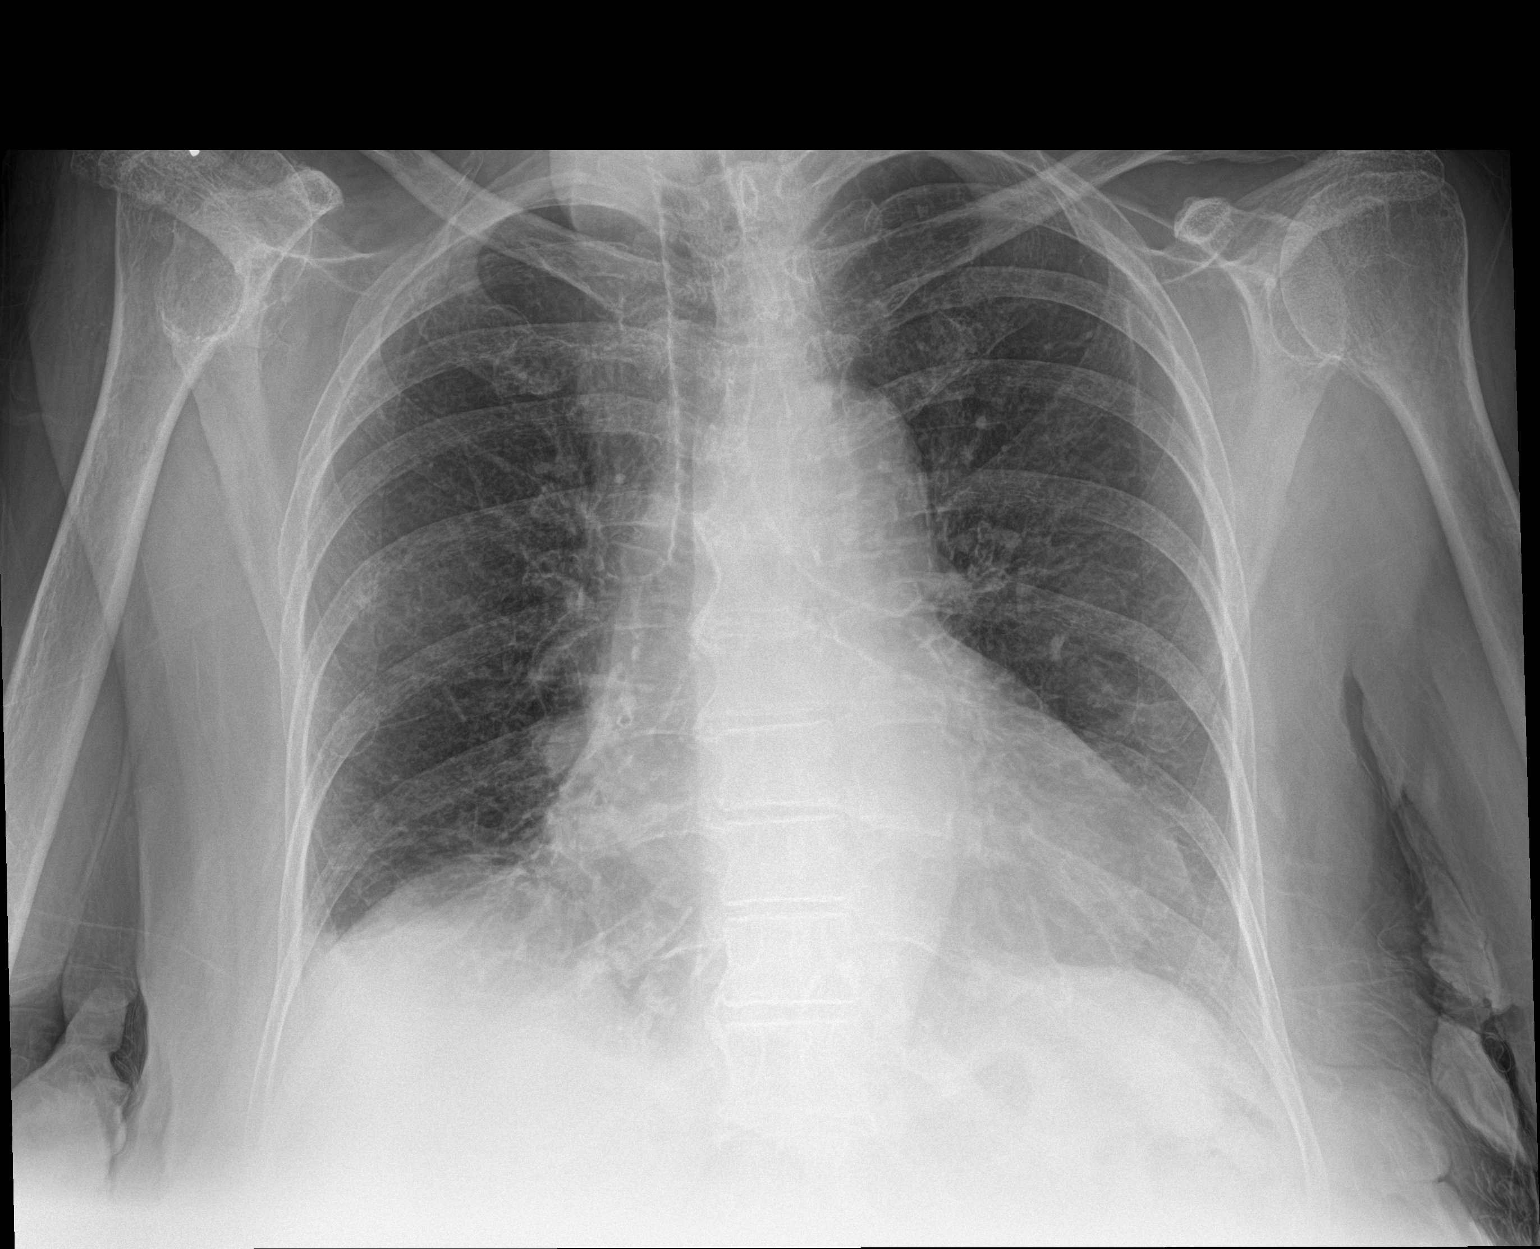

[2 of 2 positions shown; findings below may reference images not displayed]

FINDINGS: The lungs are well-aerated. Mild peribronchial thickening is noted.
There is no evidence of pleural effusion or pneumothorax.

The cardiomediastinal silhouette is mildly enlarged. No acute
osseous abnormalities are seen.
IMPRESSION: Mild peribronchial thickening noted.  Mild cardiomegaly.

## 2019-03-14 IMAGING — CT CT ANGIO CHEST
2 of 6 series · 18 of 46 positions shown · IV contrast (isovue)
Comparison: Chest x-ray from earlier today and chest CT June 07, 2015

CLINICAL DATA: Hypoxia, cough, and wheezing.  Elevated D-dimer.

EXAM:
CT ANGIOGRAPHY CHEST WITH CONTRAST
TECHNIQUE: Multidetector CT imaging of the chest was performed using the
standard protocol during bolus administration of intravenous
contrast. Multiplanar CT image reconstructions and MIPs were
obtained to evaluate the vascular anatomy.
CONTRAST:  75 mL of Isovue 370

[Series 5: thins · axial · 0.67mm/px · z∈[-246,-3]mm · 16 of 267 slices shown]
[im 12/267  lung]
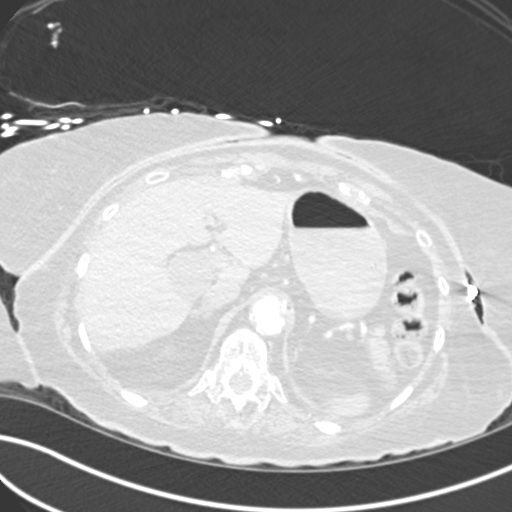
[im 35/267  soft-tissue]
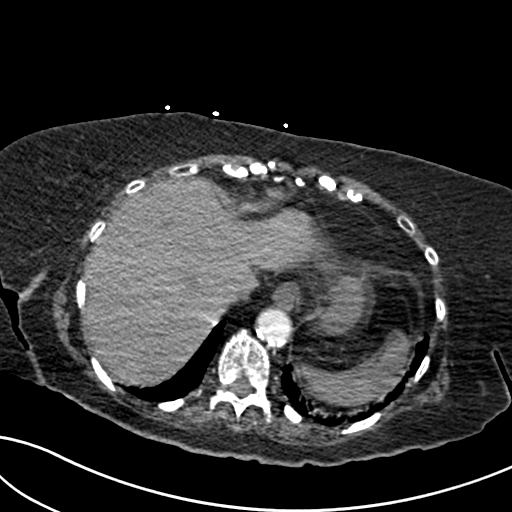
[im 47/267  lung]
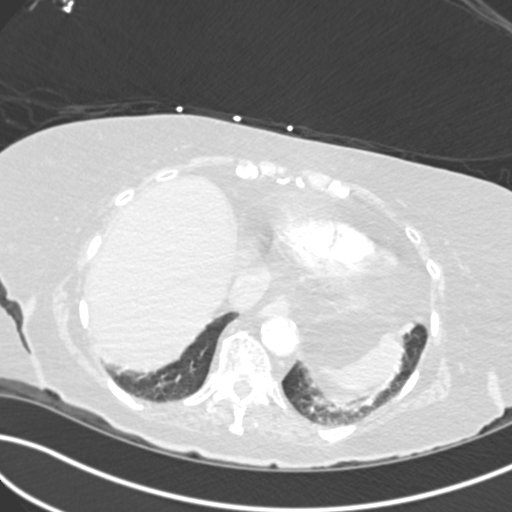
[im 58/267  soft-tissue]
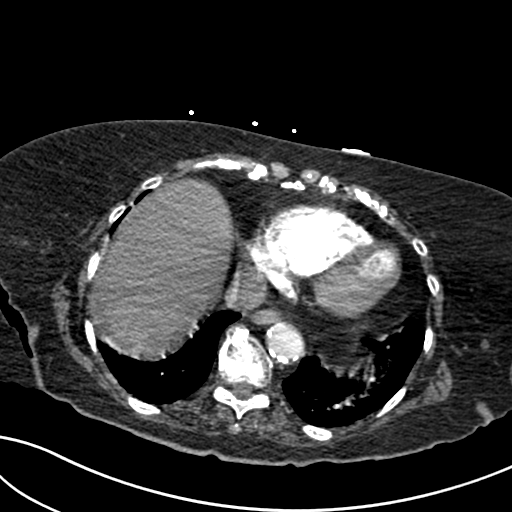
[im 81/267  lung]
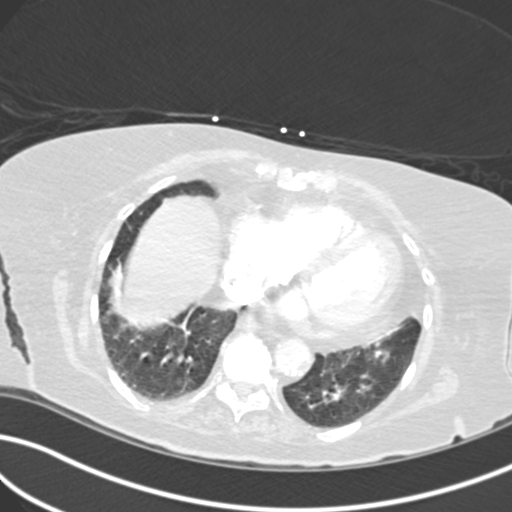
[im 93/267  soft-tissue]
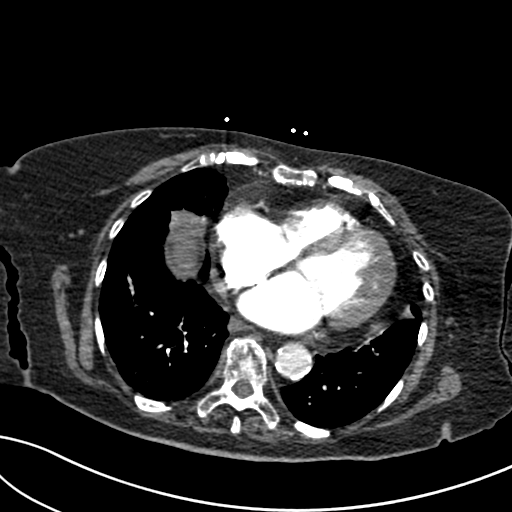
[im 105/267  lung]
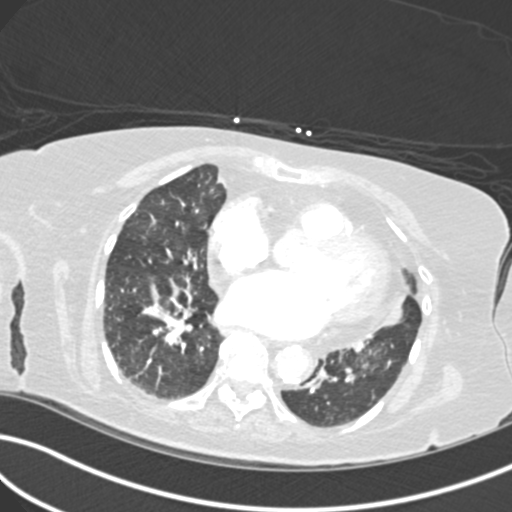
[im 128/267  soft-tissue]
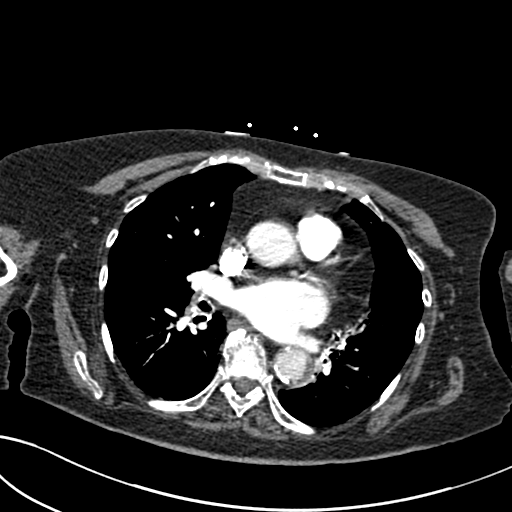
[im 139/267  lung]
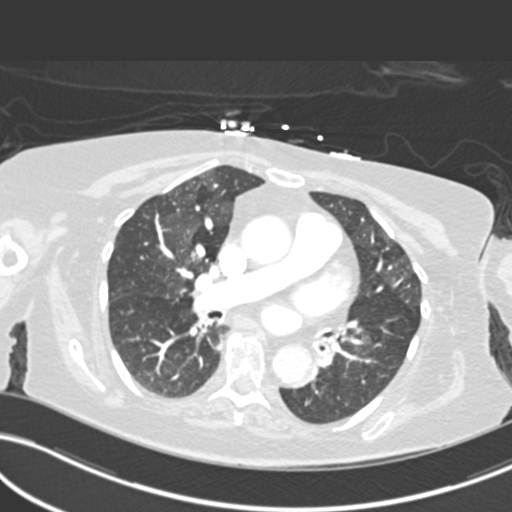
[im 162/267  soft-tissue]
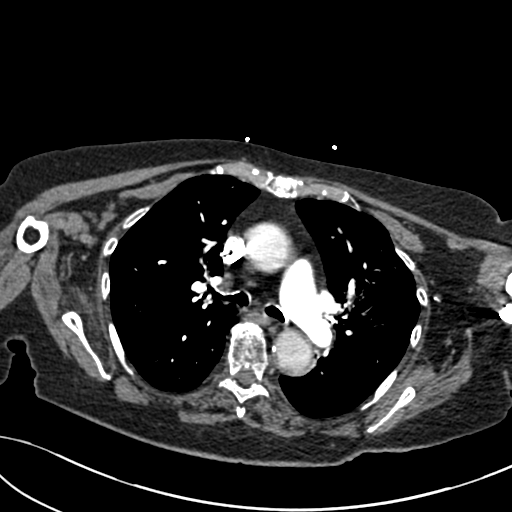
[im 174/267  lung]
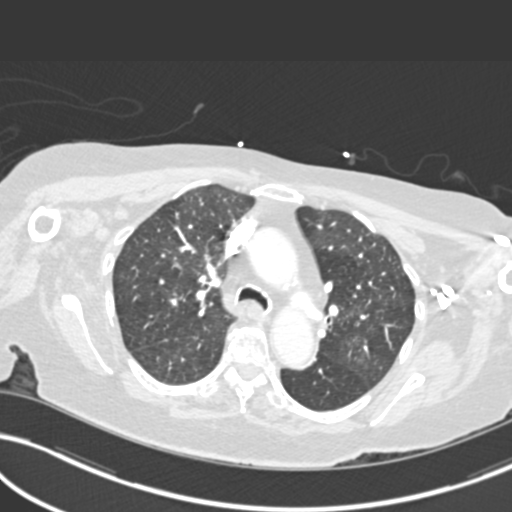
[im 186/267  soft-tissue]
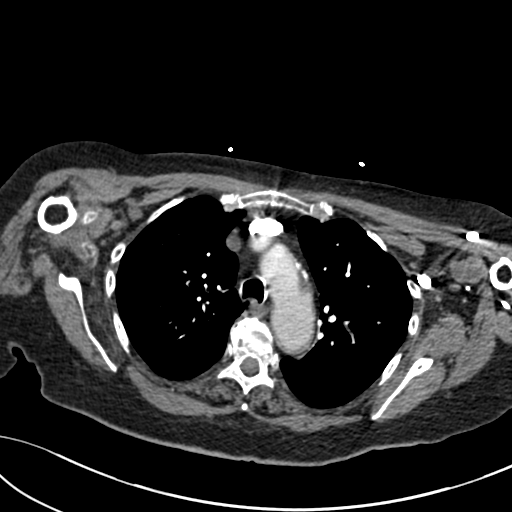
[im 209/267  lung]
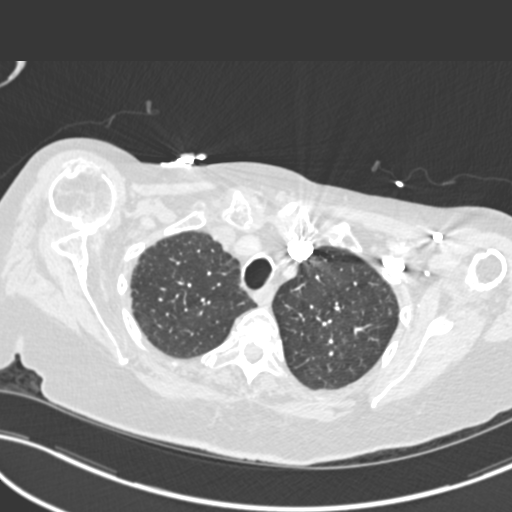
[im 220/267  soft-tissue]
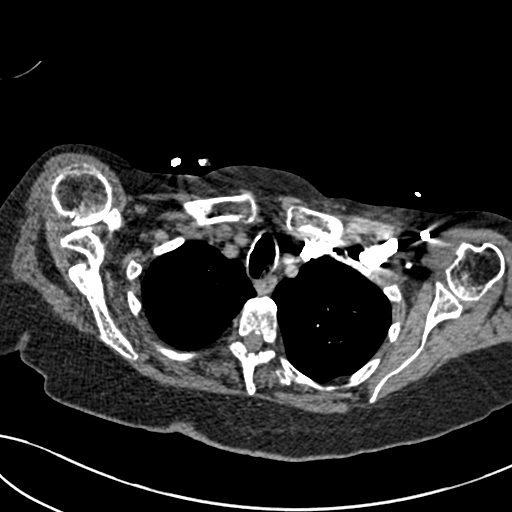
[im 232/267  lung]
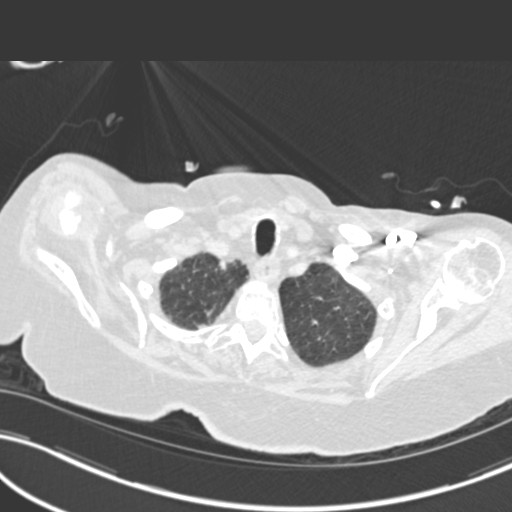
[im 255/267  soft-tissue]
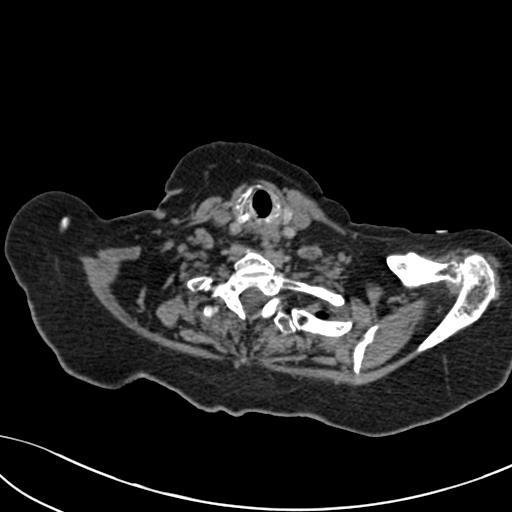

[Series 7: coronal mpr · coronal · 0.53mm/px · 2 of 73 slices shown]
[im 25/73  soft-tissue]
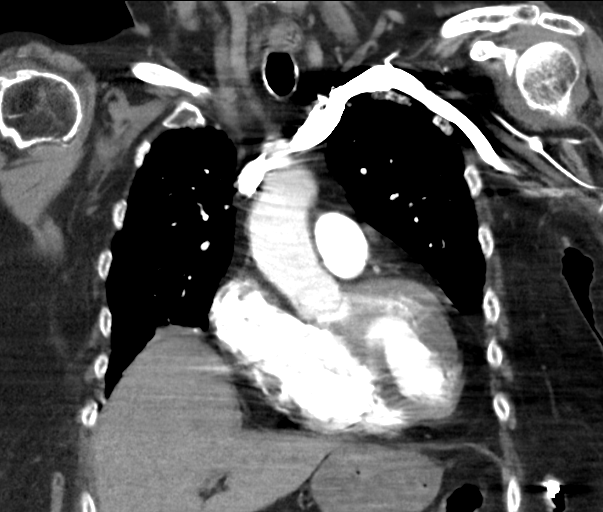
[im 49/73  soft-tissue]
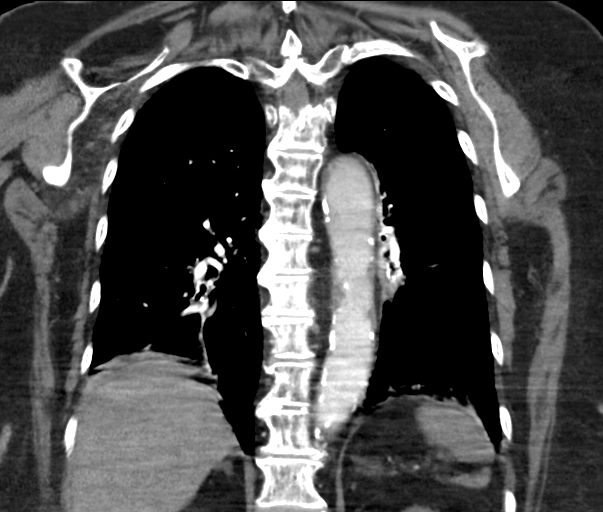

[18 of 46 positions shown; findings below may reference images not displayed]

FINDINGS: Cardiovascular: The heart size is unchanged. Atherosclerotic changes
are seen in the thoracic aorta with no dissection or aneurysm. No
pulmonary emboli.

Mediastinum/Nodes: No enlarged mediastinal, hilar, or axillary lymph
nodes. Thyroid gland, trachea, and esophagus demonstrate no
significant findings.

Lungs/Pleura: There is mucus in the trachea. No pneumothorax. Mixed
attenuation in the lungs demonstrates a component of air trapping.
There also scattered ground-glass and tree-in-bud opacities in the
lungs consistent with an infectious component. For example, these
opacities are seen in the left lower lobe on series 6, image 45 and
in the right lower lobe on series 6, image 45. Evaluation for
pulmonary nodules is limited but no suspicious nodules are
identified.

Upper Abdomen: No acute abnormality.

Musculoskeletal: No chest wall abnormality. No acute or significant
osseous findings.

Review of the MIP images confirms the above findings.
IMPRESSION: 1. No pulmonary emboli.
2. Scattered pulmonary opacities consistent with mild multifocal
pneumonia.
3. No other acute abnormalities.
